# Patient Record
Sex: Female | Born: 1970 | Race: Black or African American | Hispanic: No | Marital: Married | Smoking: Former smoker
Health system: Southern US, Community
[De-identification: ages and names within clinical notes are randomized; demographics above are authoritative.]

## PROBLEM LIST (undated history)

## (undated) DIAGNOSIS — C7931 Secondary malignant neoplasm of brain: Secondary | ICD-10-CM

## (undated) DIAGNOSIS — I10 Essential (primary) hypertension: Secondary | ICD-10-CM

## (undated) DIAGNOSIS — I471 Supraventricular tachycardia, unspecified: Secondary | ICD-10-CM

## (undated) DIAGNOSIS — F329 Major depressive disorder, single episode, unspecified: Secondary | ICD-10-CM

## (undated) DIAGNOSIS — T1491XA Suicide attempt, initial encounter: Secondary | ICD-10-CM

## (undated) DIAGNOSIS — F32A Depression, unspecified: Secondary | ICD-10-CM

## (undated) DIAGNOSIS — E041 Nontoxic single thyroid nodule: Secondary | ICD-10-CM

## (undated) DIAGNOSIS — C3490 Malignant neoplasm of unspecified part of unspecified bronchus or lung: Secondary | ICD-10-CM

## (undated) HISTORY — PX: THYROID SURGERY: SHX805

---

## 1997-08-31 ENCOUNTER — Other Ambulatory Visit: Admission: RE | Admit: 1997-08-31 | Discharge: 1997-08-31 | Payer: Self-pay | Admitting: Family Medicine

## 1998-01-19 ENCOUNTER — Emergency Department (HOSPITAL_COMMUNITY): Admission: EM | Admit: 1998-01-19 | Discharge: 1998-01-19 | Payer: Self-pay | Admitting: Emergency Medicine

## 1998-02-18 ENCOUNTER — Encounter: Payer: Self-pay | Admitting: Emergency Medicine

## 1998-02-18 ENCOUNTER — Emergency Department (HOSPITAL_COMMUNITY): Admission: EM | Admit: 1998-02-18 | Discharge: 1998-02-18 | Payer: Self-pay | Admitting: Emergency Medicine

## 1998-03-25 ENCOUNTER — Emergency Department (HOSPITAL_COMMUNITY): Admission: EM | Admit: 1998-03-25 | Discharge: 1998-03-25 | Payer: Self-pay | Admitting: Emergency Medicine

## 1998-07-26 ENCOUNTER — Inpatient Hospital Stay (HOSPITAL_COMMUNITY): Admission: EM | Admit: 1998-07-26 | Discharge: 1998-07-27 | Payer: Self-pay | Admitting: Emergency Medicine

## 1999-01-12 ENCOUNTER — Emergency Department (HOSPITAL_COMMUNITY): Admission: EM | Admit: 1999-01-12 | Discharge: 1999-01-12 | Payer: Self-pay

## 2000-02-17 ENCOUNTER — Ambulatory Visit (HOSPITAL_COMMUNITY): Admission: RE | Admit: 2000-02-17 | Discharge: 2000-02-17 | Payer: Self-pay | Admitting: Family Medicine

## 2000-02-17 ENCOUNTER — Encounter: Payer: Self-pay | Admitting: Family Medicine

## 2000-11-03 ENCOUNTER — Emergency Department (HOSPITAL_COMMUNITY): Admission: EM | Admit: 2000-11-03 | Discharge: 2000-11-03 | Payer: Self-pay

## 2000-11-14 ENCOUNTER — Emergency Department (HOSPITAL_COMMUNITY): Admission: EM | Admit: 2000-11-14 | Discharge: 2000-11-14 | Payer: Self-pay | Admitting: Emergency Medicine

## 2000-11-14 ENCOUNTER — Encounter: Payer: Self-pay | Admitting: Emergency Medicine

## 2003-07-10 ENCOUNTER — Emergency Department (HOSPITAL_COMMUNITY): Admission: AC | Admit: 2003-07-10 | Discharge: 2003-07-10 | Payer: Self-pay

## 2004-07-22 ENCOUNTER — Emergency Department (HOSPITAL_COMMUNITY): Admission: EM | Admit: 2004-07-22 | Discharge: 2004-07-22 | Payer: Self-pay | Admitting: Emergency Medicine

## 2005-07-31 ENCOUNTER — Emergency Department (HOSPITAL_COMMUNITY): Admission: EM | Admit: 2005-07-31 | Discharge: 2005-07-31 | Payer: Self-pay | Admitting: Emergency Medicine

## 2005-10-16 ENCOUNTER — Emergency Department (HOSPITAL_COMMUNITY): Admission: EM | Admit: 2005-10-16 | Discharge: 2005-10-17 | Payer: Self-pay | Admitting: Emergency Medicine

## 2007-06-18 ENCOUNTER — Emergency Department (HOSPITAL_COMMUNITY): Admission: EM | Admit: 2007-06-18 | Discharge: 2007-06-18 | Payer: Self-pay | Admitting: Emergency Medicine

## 2007-08-12 ENCOUNTER — Emergency Department (HOSPITAL_COMMUNITY): Admission: EM | Admit: 2007-08-12 | Discharge: 2007-08-12 | Payer: Self-pay | Admitting: Emergency Medicine

## 2008-07-21 ENCOUNTER — Emergency Department (HOSPITAL_COMMUNITY): Admission: EM | Admit: 2008-07-21 | Discharge: 2008-07-21 | Payer: Self-pay | Admitting: Emergency Medicine

## 2009-04-12 ENCOUNTER — Emergency Department (HOSPITAL_COMMUNITY): Admission: EM | Admit: 2009-04-12 | Discharge: 2009-04-12 | Payer: Self-pay | Admitting: Emergency Medicine

## 2009-05-23 ENCOUNTER — Emergency Department (HOSPITAL_COMMUNITY): Admission: EM | Admit: 2009-05-23 | Discharge: 2009-05-23 | Payer: Self-pay | Admitting: Emergency Medicine

## 2009-06-05 ENCOUNTER — Emergency Department (HOSPITAL_COMMUNITY): Admission: EM | Admit: 2009-06-05 | Discharge: 2009-06-05 | Payer: Self-pay | Admitting: Family Medicine

## 2009-07-21 ENCOUNTER — Emergency Department (HOSPITAL_COMMUNITY): Admission: EM | Admit: 2009-07-21 | Discharge: 2009-07-21 | Payer: Self-pay | Admitting: Emergency Medicine

## 2009-08-22 ENCOUNTER — Encounter (HOSPITAL_COMMUNITY): Admission: RE | Admit: 2009-08-22 | Discharge: 2009-11-20 | Payer: Self-pay | Admitting: Endocrinology

## 2010-03-31 ENCOUNTER — Encounter: Payer: Self-pay | Admitting: Endocrinology

## 2010-05-28 LAB — STREP A DNA PROBE

## 2010-05-29 LAB — DIFFERENTIAL
Basophils Absolute: 0 10*3/uL (ref 0.0–0.1)
Monocytes Absolute: 1 10*3/uL (ref 0.1–1.0)
Monocytes Relative: 9 % (ref 3–12)
Neutro Abs: 8.2 10*3/uL — ABNORMAL HIGH (ref 1.7–7.7)
Neutrophils Relative %: 72 % (ref 43–77)

## 2010-05-29 LAB — URINALYSIS, ROUTINE W REFLEX MICROSCOPIC
Glucose, UA: NEGATIVE mg/dL
Hgb urine dipstick: NEGATIVE
Protein, ur: NEGATIVE mg/dL
pH: 5.5 (ref 5.0–8.0)

## 2010-05-29 LAB — CBC
HCT: 38.8 % (ref 36.0–46.0)
Hemoglobin: 13.1 g/dL (ref 12.0–15.0)
MCHC: 33.8 g/dL (ref 30.0–36.0)
MCV: 93.8 fL (ref 78.0–100.0)
WBC: 11.3 10*3/uL — ABNORMAL HIGH (ref 4.0–10.5)

## 2010-05-29 LAB — BASIC METABOLIC PANEL
CO2: 23 mEq/L (ref 19–32)
GFR calc Af Amer: >60 mL/min (ref >60)
GFR calc non Af Amer: >60 mL/min (ref >60)
Glucose, Bld: 87 mg/dL (ref 70–99)
Potassium: 4.1 mEq/L (ref 3.5–5.1)
Sodium: 138 mEq/L (ref 135–145)

## 2010-05-29 LAB — POCT CARDIAC MARKERS
CKMB, poc: <1 ng/mL — ABNORMAL LOW (ref 1.0–8.0)
Myoglobin, poc: 33.3 ng/mL (ref 12–200)

## 2010-06-18 LAB — URINALYSIS, ROUTINE W REFLEX MICROSCOPIC
Bilirubin Urine: NEGATIVE
Ketones, ur: NEGATIVE mg/dL
Nitrite: NEGATIVE
Urobilinogen, UA: 0.2 mg/dL (ref 0.0–1.0)

## 2010-06-18 LAB — WET PREP, GENITAL
Clue Cells Wet Prep HPF POC: NONE SEEN
Trich, Wet Prep: NONE SEEN

## 2010-12-03 LAB — DIFFERENTIAL
Basophils Absolute: 0
Basophils Relative: 0
Eosinophils Absolute: 0.1
Lymphocytes Relative: 21
Monocytes Absolute: 0.7
Neutro Abs: 6.6

## 2010-12-03 LAB — COMPREHENSIVE METABOLIC PANEL
ALT: 24
AST: 30
Albumin: 4
Calcium: 9.3
Chloride: 104
Total Bilirubin: 0.6
Total Protein: 6.9

## 2010-12-03 LAB — CBC
HCT: 36.7
Hemoglobin: 12.7
MCHC: 34.6
MCV: 90.4
RDW: 14.6
WBC: 9.4

## 2012-07-05 ENCOUNTER — Emergency Department (HOSPITAL_COMMUNITY): Payer: Self-pay

## 2012-07-05 ENCOUNTER — Encounter (HOSPITAL_COMMUNITY): Payer: Self-pay | Admitting: *Deleted

## 2012-07-05 ENCOUNTER — Emergency Department (HOSPITAL_COMMUNITY)
Admission: EM | Admit: 2012-07-05 | Discharge: 2012-07-05 | Disposition: A | Discharge status: Home / Self Care | Payer: Self-pay | Attending: Emergency Medicine | Admitting: Emergency Medicine

## 2012-07-05 DIAGNOSIS — R51 Headache: Secondary | ICD-10-CM | POA: Insufficient documentation

## 2012-07-05 DIAGNOSIS — Z79899 Other long term (current) drug therapy: Secondary | ICD-10-CM | POA: Insufficient documentation

## 2012-07-05 DIAGNOSIS — F17200 Nicotine dependence, unspecified, uncomplicated: Secondary | ICD-10-CM | POA: Insufficient documentation

## 2012-07-05 DIAGNOSIS — H53149 Visual discomfort, unspecified: Secondary | ICD-10-CM | POA: Insufficient documentation

## 2012-07-05 DIAGNOSIS — Z3202 Encounter for pregnancy test, result negative: Secondary | ICD-10-CM | POA: Insufficient documentation

## 2012-07-05 DIAGNOSIS — R110 Nausea: Secondary | ICD-10-CM | POA: Insufficient documentation

## 2012-07-05 LAB — POCT I-STAT, CHEM 8
Calcium, Ion: 1.2 mmol/L (ref 1.12–1.23)
Chloride: 104 mEq/L (ref 96–112)
Creatinine, Ser: 0.5 mg/dL (ref 0.50–1.10)
Glucose, Bld: 97 mg/dL (ref 70–99)
HCT: 42 % (ref 36.0–46.0)
Potassium: 3.6 mEq/L (ref 3.5–5.1)

## 2012-07-05 LAB — URINALYSIS, ROUTINE W REFLEX MICROSCOPIC
Bilirubin Urine: NEGATIVE
Hgb urine dipstick: NEGATIVE
Ketones, ur: NEGATIVE mg/dL
Nitrite: NEGATIVE
Specific Gravity, Urine: 1.028 (ref 1.005–1.030)
pH: 6 (ref 5.0–8.0)

## 2012-07-05 LAB — URINE MICROSCOPIC-ADD ON

## 2012-07-05 LAB — PREGNANCY, URINE: Preg Test, Ur: NEGATIVE

## 2012-07-05 MED ORDER — PROCHLORPERAZINE EDISYLATE 5 MG/ML IJ SOLN
10.0000 mg | Freq: Once | INTRAMUSCULAR | Status: AC
  Administered 2012-07-05: 10 mg via INTRAVENOUS
  Filled 2012-07-05: qty 2

## 2012-07-05 MED ORDER — SODIUM CHLORIDE 0.9 % IV BOLUS (SEPSIS)
500.0000 mL | Freq: Once | INTRAVENOUS | Status: AC
  Administered 2012-07-05: 500 mL via INTRAVENOUS

## 2012-07-05 NOTE — ED Provider Notes (Signed)
History     CSN: [id number]  Arrival date & time 07/05/12  1500   First MD Initiated Contact with Patient 07/05/12 2154      Chief Complaint  Patient presents with  . Headache    (Consider location/radiation/quality/duration/timing/severity/associated sxs/prior treatment) Patient is a 42 y.o. female presenting with headaches. The history is provided by the patient.  Headache Associated symptoms: nausea and photophobia   Associated symptoms: no abdominal pain, no back pain, no diarrhea, no pain, no neck stiffness, no numbness and no vomiting   patient has had a headache over the last 5 days.is dull and somewhat in the front of her head. She's had nausea without vomiting. No fevers. She states that light bothers her a little bit. She states about a week ago she saw something is moving around in front of her eyes. No numbness weakness. No confusion. No fevers. No trauma. No abdominal pain. No sick contacts.patient states she has some glasses that she is supposed to wear but she does not wear them.   History reviewed. No pertinent past medical history.  Past Surgical History  Procedure Laterality Date  . Thyroid nodule removal      No family history on file.  History  Substance Use Topics  . Smoking status: Current Every Day Smoker  . Smokeless tobacco: Not on file  . Alcohol Use: Yes     Comment: occ    OB History   Grav Para Term Preterm Abortions TAB SAB Ect Mult Living                  Review of Systems  Constitutional: Negative for activity change and appetite change.  HENT: Negative for neck stiffness.   Eyes: Positive for photophobia. Negative for pain.  Respiratory: Negative for chest tightness and shortness of breath.   Cardiovascular: Negative for chest pain and leg swelling.  Gastrointestinal: Positive for nausea. Negative for vomiting, abdominal pain and diarrhea.  Genitourinary: Negative for flank pain.  Musculoskeletal: Negative for back pain.  Skin:  Negative for rash.  Neurological: Positive for headaches. Negative for weakness and numbness.  Psychiatric/Behavioral: Negative for behavioral problems.    Allergies  Review of patient's allergies indicates no known allergies.  Home Medications   Current Outpatient Rx  Name  Route  Sig  Dispense  Refill  . ibuprofen (ADVIL,MOTRIN) 200 MG tablet   Oral   Take 200 mg by mouth every 6 (six) hours as needed for pain.         . naproxen sodium (ANAPROX) 220 MG tablet   Oral   Take 220 mg by mouth 2 (two) times daily with a meal.           BP 135/78  Pulse 89  Temp(Src) 98.1 F (36.7 C) (Oral)  Resp 18  SpO2 100%  LMP 06/22/2012  Physical Exam  Nursing note and vitals reviewed. Constitutional: She is oriented to person, place, and time. She appears well-developed and well-nourished.  Patient appears mildly uncomfortable  HENT:  Head: Normocephalic and atraumatic.  Eyes: EOM are normal. Pupils are equal, round, and reactive to light.  Neck: Normal range of motion. Neck supple.  Cardiovascular: Normal rate, regular rhythm and normal heart sounds.   No murmur heard. Pulmonary/Chest: Effort normal and breath sounds normal. No respiratory distress. She has no wheezes. She has no rales.  Abdominal: Soft. Bowel sounds are normal. She exhibits no distension. There is no tenderness. There is no rebound and no guarding.  Musculoskeletal:  Normal range of motion.  Neurological: She is alert and oriented to person, place, and time. No cranial nerve deficit.  Skin: Skin is warm and dry.  Psychiatric: She has a normal mood and affect. Her speech is normal.    ED Course  Procedures (including critical care time)  Labs Reviewed  URINALYSIS, ROUTINE W REFLEX MICROSCOPIC - Abnormal; Notable for the following:    APPearance TURBID (*)    All other components within normal limits  URINE MICROSCOPIC-ADD ON - Abnormal; Notable for the following:    Bacteria, UA MANY (*)    All other  components within normal limits  PREGNANCY, URINE  POCT I-STAT, CHEM 8   No results found.   1. Headache       MDM  Patient with headache. Nonfocal exam. No fevers. Patient received Compazine for treatment and later wanted to leave. Patient was not willing to stay. She was discharged home. CT scan was not done before the discharge she was told of the risks        Juliet Rude. Rubin Payor, MD 07/06/12 Jac[facility] Shoe

## 2012-07-05 NOTE — ED Notes (Signed)
Pt is here with headache for the last 5 days and it is in both eyes and reports nausea.  Pt reports hurts to just touch head

## 2012-07-05 NOTE — ED Notes (Signed)
Pt refused CT head; explained to pt. why procedure was ordered/needed; pt continued to refused and stated she was just ready to go.  Physician advised.

## 2013-11-03 ENCOUNTER — Emergency Department (HOSPITAL_COMMUNITY)
Admission: EM | Admit: 2013-11-03 | Discharge: 2013-11-03 | Disposition: A | Discharge status: Home / Self Care | Payer: Medicaid Other | Attending: Emergency Medicine | Admitting: Emergency Medicine

## 2013-11-03 ENCOUNTER — Emergency Department (HOSPITAL_COMMUNITY): Payer: Medicaid Other

## 2013-11-03 ENCOUNTER — Encounter (HOSPITAL_COMMUNITY): Payer: Self-pay | Admitting: Emergency Medicine

## 2013-11-03 DIAGNOSIS — R000 Tachycardia, unspecified: Secondary | ICD-10-CM | POA: Insufficient documentation

## 2013-11-03 DIAGNOSIS — Z79899 Other long term (current) drug therapy: Secondary | ICD-10-CM | POA: Diagnosis not present

## 2013-11-03 DIAGNOSIS — I498 Other specified cardiac arrhythmias: Secondary | ICD-10-CM | POA: Diagnosis not present

## 2013-11-03 DIAGNOSIS — Z8639 Personal history of other endocrine, nutritional and metabolic disease: Secondary | ICD-10-CM | POA: Insufficient documentation

## 2013-11-03 DIAGNOSIS — Z862 Personal history of diseases of the blood and blood-forming organs and certain disorders involving the immune mechanism: Secondary | ICD-10-CM | POA: Diagnosis not present

## 2013-11-03 DIAGNOSIS — I471 Supraventricular tachycardia: Secondary | ICD-10-CM

## 2013-11-03 DIAGNOSIS — F17200 Nicotine dependence, unspecified, uncomplicated: Secondary | ICD-10-CM | POA: Insufficient documentation

## 2013-11-03 HISTORY — DX: Nontoxic single thyroid nodule: E04.1

## 2013-11-03 LAB — CBC
HCT: 38.1 % (ref 36.0–46.0)
Hemoglobin: 13.4 g/dL (ref 12.0–15.0)
MCH: 30.7 pg (ref 26.0–34.0)
MCHC: 35.2 g/dL (ref 30.0–36.0)
MCV: 87.4 fL (ref 78.0–100.0)
PLATELETS: 205 10*3/uL (ref 150–400)
RBC: 4.36 MIL/uL (ref 3.87–5.11)
RDW: 14.2 % (ref 11.5–15.5)
WBC: 14.5 10*3/uL — ABNORMAL HIGH (ref 4.0–10.5)

## 2013-11-03 LAB — BASIC METABOLIC PANEL
ANION GAP: 15 (ref 5–15)
BUN: 16 mg/dL (ref 6–23)
CALCIUM: 8.6 mg/dL (ref 8.4–10.5)
CO2: 22 mEq/L (ref 19–32)
CREATININE: 0.53 mg/dL (ref 0.50–1.10)
Chloride: 104 mEq/L (ref 96–112)
GFR calc non Af Amer: >90 mL/min (ref >90)
Glucose, Bld: 120 mg/dL — ABNORMAL HIGH (ref 70–99)
Potassium: 3.4 mEq/L — ABNORMAL LOW (ref 3.7–5.3)
Sodium: 141 mEq/L (ref 137–147)

## 2013-11-03 LAB — I-STAT TROPONIN, ED: Troponin i, poc: 0.01 ng/mL (ref 0.00–0.08)

## 2013-11-03 NOTE — ED Notes (Signed)
Pt presents from home via [facility] with c/o tachycardia. Pt felt her heart start racing around 11am today and called EMS. EMS found patient to be in SVT with a rate of 168bpm. EMS gave 6mg  Adenosine PTA and patient converted to NSR with a rate of 90-100bpm.  Pt c/o 1/10 dull chest pain to the central chest, is alert and oriented and in NAD, HR 99 and NSR at this time.

## 2013-11-03 NOTE — Discharge Instructions (Signed)
Supraventricular Tachycardia °Supraventricular tachycardia (SVT) is an abnormal heart rhythm (arrhythmia) that causes the heart to beat very fast (tachycardia). This kind of fast heartbeat originates in the upper chambers of the heart (atria). SVT can cause the heart to beat greater than 100 beats per minute. SVT can have a rapid burst of heartbeats. This can start and stop suddenly without warning and is called nonsustained. SVT can also be sustained, in which the heart beats at a continuous fast rate.  °CAUSES  °There can be different causes of SVT. Some of these include: °· Heart valve problems such as mitral valve prolapse. °· An enlarged heart (hypertrophic cardiomyopathy). °· Congenital heart problems. °· Heart inflammation (pericarditis). °· Hyperthyroidism. °· Low potassium or magnesium levels. °· Caffeine. °· Drug use such as cocaine, methamphetamines, or stimulants. °· Some over-the-counter medicines such as: °¨ Decongestants. °¨ Diet medicines. °¨ Herbal medicines. °SYMPTOMS  °Symptoms of SVT can vary. Symptoms depend on whether the SVT is sustained or nonsustained. You may experience: °· No symptoms (asymptomatic). °· An awareness of your heart beating rapidly (palpitations). °· Shortness of breath. °· Chest pain or pressure. °If your blood pressure drops because of the SVT, you may experience: °· Fainting or near fainting. °· Weakness. °· Dizziness. °DIAGNOSIS  °Different tests can be performed to diagnose SVT, such as: °· An electrocardiogram (EKG). This is a painless test that records the electrical activity of your heart. °· Holter monitor. This is a 24 hour recording of your heart rhythm. You will be given a diary. Write down all symptoms that you have and what you were doing at the time you experienced symptoms. °· Arrhythmia monitor. This is a small device that your wear for several weeks. It records the heart rhythm when you have symptoms. °· Echocardiogram. This is an imaging test to help detect  abnormal heart structure such as congenital abnormalities, heart valve problems, or heart enlargement. °· Stress test. This test can help determine if the SVT is related to exercise. °· Electrophysiology study (EPS). This is a procedure that evaluates your heart's electrical system and can help your caregiver find the cause of your SVT. °TREATMENT  °Treatment of SVT depends on the symptoms, how often it recurs, and whether there are any underlying heart problems.  °· If symptoms are rare and no other cardiac disease is present, no treatment may be needed. °· Blood work may be done to check potassium, magnesium, and thyroid hormone levels to see if they are abnormal. If these levels are abnormal, treatment to correct the problems will occur. °Medicines °Your caregiver may use oral medicines to treat SVT. These medicines are given for long-term control of SVT. Medicines may be used alone or in combination with other treatments. These medicines work to slow nerve impulses in the heart muscle. These medicines can also be used to treat high blood pressure. Some of these medicines may include: °· Calcium channel blockers. °· Beta blockers. °· Digoxin. °Nonsurgical procedures °Nonsurgical techniques may be used if oral medicines do not work. Some examples include: °· Cardioversion. This technique uses either drugs or an electrical shock to restore a normal heart rhythm. °¨ Cardioversion drugs may be given through an intravenous (IV) line to help "reset" the heart rhythm. °¨ In electrical cardioversion, the caregiver shocks your heart to stop its beat for a split second. This helps to reset the heart to a normal rhythm. °· Ablation. This procedure is done under mild sedation. High frequency radio wave energy is used to   destroy the area of heart tissue responsible for the SVT. °HOME CARE INSTRUCTIONS  °· Do not smoke. °· Only take medicines prescribed by your caregiver. Check with your caregiver before using over-the-counter  medicines. °· Check with your caregiver about how much alcohol and caffeine (coffee, tea, colas, or chocolate) you may have. °· It is very important to keep all follow-up referrals and appointments in order to properly manage this problem. °SEEK IMMEDIATE MEDICAL CARE IF: °· You have dizziness. °· You faint or nearly faint. °· You have shortness of breath. °· You have chest pain or pressure. °· You have sudden nausea or vomiting. °· You have profuse sweating. °· You are concerned about how long your symptoms last. °· You are concerned about the frequency of your SVT episodes. °If you have the above symptoms, call your local emergency services (911 in [place] immediately. Do not drive yourself to the hospital. °MAKE SURE YOU:  °· Understand these instructions. °· Will watch your condition. °· Will get help right away if you are not doing well or get worse. °Document Released: 02/24/2005 Document Revised: 05/19/2011 Document Reviewed: 06/08/2008 °[employer]® Patient Information ©2015 [employer]. This information is not intended to replace advice given to you by your health care provider. Make sure you discuss any questions you have with your health care provider. ° °

## 2013-11-03 NOTE — ED Notes (Signed)
Pt comfortable with discharge and follow up instructions. No prescriptions., Pt declines wheelchair, escorted to waiting area.

## 2013-11-03 NOTE — ED Provider Notes (Signed)
CSN: [id number]     Arrival date & time 11/03/13  1152 History   First MD Initiated Contact with Patient 11/03/13 1153     Chief Complaint  Patient presents with  . Tachycardia     (Consider location/radiation/quality/duration/timing/severity/associated sxs/prior Treatment) HPI Comments: Patient is a 43 year old female with a past medical history of a thyroid nodule who presents to the emergency department via EMS from home with tachycardia. Patient reports she felt her heart racing beginning around 11:00 AM today after just walking around her house, her heart racing lasted about 10 minutes until she decided to call EMS. On EMS arrival, patient was noted to be in SVT with a heart rate of 168, she was given 6 mg adenosine with successful conversion to normal sinus rhythm with a rate between 90-100. States after the episode of tachycardia, she started to experience a very dull left-sided chest pain, nonradiating rated 1/10. No aggravating or alleviating factors. States she felt nauseated and "spit up" once prior to the onset of tachycardia. Denies fever, chills, shortness of breath. States she had a similar episode 3-4 years ago, however has never been evaluated by a cardiologist and does not have a primary care physician at this time.  The history is provided by the patient and the EMS personnel.    Past Medical History  Diagnosis Date  . Thyroid nodule    Past Surgical History  Procedure Laterality Date  . Thyroid nodule removal    . Thyroid surgery      Removed thyroid nodule, states still has thyroid   No family history on file. History  Substance Use Topics  . Smoking status: Current Every Day Smoker  . Smokeless tobacco: Not on file  . Alcohol Use: Yes     Comment: occ   OB History   Grav Para Term Preterm Abortions TAB SAB Ect Mult Living                 Review of Systems  Cardiovascular: Positive for chest pain and palpitations.  Gastrointestinal: Positive for nausea  (subsided).  All other systems reviewed and are negative.     Allergies  Review of patient's allergies indicates no known allergies.  Home Medications   Prior to Admission medications   Medication Sig Start Date End Date Taking? Authorizing Provider  Multiple Vitamins-Calcium (ONE-A-DAY WOMENS PO) Take 1 tablet by mouth daily. 11/01/13  Yes Historical Provider, MD   BP 135/70  Pulse 98  Temp(Src) 98.7 F (37.1 C) (Oral)  Resp 16  SpO2 98%  LMP 10/03/2013 Physical Exam  Nursing note and vitals reviewed. Constitutional: She is oriented to person, place, and time. She appears well-developed and well-nourished. No distress.  HENT:  Head: Normocephalic and atraumatic.  Mouth/Throat: Oropharynx is clear and moist.  Eyes: Conjunctivae and EOM are normal. Pupils are equal, round, and reactive to light.  Neck: Normal range of motion. Neck supple. No JVD present.  Cardiovascular: Regular rhythm, normal heart sounds and intact distal pulses.   Tachy ~100 No extremity edema.  Pulmonary/Chest: Effort normal and breath sounds normal. No respiratory distress.  Abdominal: Soft. Bowel sounds are normal. There is no tenderness.  Musculoskeletal: Normal range of motion. She exhibits no edema.  Neurological: She is alert and oriented to person, place, and time. She has normal strength. No sensory deficit.  Speech fluent, goal oriented. Moves limbs without ataxia. Equal grip strength bilateral.  Skin: Skin is warm and dry. She is not diaphoretic.  Psychiatric: She has  a normal mood and affect. Her behavior is normal.    ED Course  Procedures (including critical care time) Labs Review Labs Reviewed  CBC - Abnormal; Notable for the following:    WBC 14.5 (*)    All other components within normal limits  BASIC METABOLIC PANEL - Abnormal; Notable for the following:    Potassium 3.4 (*)    Glucose, Bld 120 (*)    All other components within normal limits  Randolm Idol, ED     Imaging Review Dg Chest 2 View  11/03/2013   CLINICAL DATA:  Tachycardia.  EXAM: CHEST  2 VIEW  COMPARISON:  05/23/2009, 07/10/2003.  FINDINGS: Mediastinum and hilar structures normal. Lungs are clear of acute infiltrates. No pleural effusion or pneumothorax. Heart size stable. No acute bony abnormality.  IMPRESSION: No acute cardiopulmonary disease.   Electronically Signed   By: Marcello Moores  Register   On: 11/03/2013 13:10     EKG Interpretation None      MDM   Final diagnoses:  SVT (supraventricular tachycardia)   Patient presenting after an episode of SVT converted with adenosine prior to arrival. She is nontoxic appearing and in no apparent distress. Afebrile, vital signs stable. Heart rate between 90-100 in the emergency department. She has a very dull, 1/10 chest pain at this time. Plan to obtain labs and consults cardiology as this is patient's second episode of SVT and she has not yet been evaluated by cardiology. 1:21 PM Pt's HR remaining stable between 80s-90s. I spoke with Trish with cardiology who will set patient up with an outpatient appointment since pt has remained in NSR. Stable for d/c. Return precautions given. Patient states understanding of treatment care plan and is agreeable.  Case discussed with attending Dr. Tawnya Crook who agrees with plan of care.   Illene Labrador, PA-C 11/03/13 1322

## 2013-11-03 NOTE — ED Provider Notes (Signed)
Medical screening examination/treatment/procedure(s) were performed by non-physician practitioner and as supervising physician I was immediately available for consultation/collaboration.   EKG Interpretation None        Ernestina Patches, MD 11/03/13 1710

## 2013-11-03 NOTE — ED Notes (Signed)
ED PA at bedside

## 2013-12-08 ENCOUNTER — Emergency Department (HOSPITAL_COMMUNITY)
Admission: EM | Admit: 2013-12-08 | Discharge: 2013-12-08 | Disposition: A | Discharge status: Home / Self Care | Payer: Medicaid Other | Attending: Emergency Medicine | Admitting: Emergency Medicine

## 2013-12-08 ENCOUNTER — Emergency Department (HOSPITAL_COMMUNITY): Payer: Medicaid Other

## 2013-12-08 ENCOUNTER — Encounter: Payer: Medicaid Other | Admitting: Interventional Cardiology

## 2013-12-08 ENCOUNTER — Encounter (HOSPITAL_COMMUNITY): Payer: Self-pay | Admitting: Emergency Medicine

## 2013-12-08 DIAGNOSIS — I499 Cardiac arrhythmia, unspecified: Secondary | ICD-10-CM | POA: Diagnosis present

## 2013-12-08 DIAGNOSIS — Z8639 Personal history of other endocrine, nutritional and metabolic disease: Secondary | ICD-10-CM | POA: Insufficient documentation

## 2013-12-08 DIAGNOSIS — M545 Low back pain: Secondary | ICD-10-CM | POA: Insufficient documentation

## 2013-12-08 DIAGNOSIS — Z8659 Personal history of other mental and behavioral disorders: Secondary | ICD-10-CM | POA: Diagnosis not present

## 2013-12-08 DIAGNOSIS — Z79899 Other long term (current) drug therapy: Secondary | ICD-10-CM | POA: Diagnosis not present

## 2013-12-08 DIAGNOSIS — Z720 Tobacco use: Secondary | ICD-10-CM | POA: Diagnosis not present

## 2013-12-08 DIAGNOSIS — I471 Supraventricular tachycardia: Secondary | ICD-10-CM

## 2013-12-08 HISTORY — DX: Major depressive disorder, single episode, unspecified: F32.9

## 2013-12-08 HISTORY — DX: Depression, unspecified: F32.A

## 2013-12-08 HISTORY — DX: Suicide attempt, initial encounter: T14.91XA

## 2013-12-08 LAB — BASIC METABOLIC PANEL
ANION GAP: 16 — AB (ref 5–15)
BUN: 7 mg/dL (ref 6–23)
CO2: 21 mEq/L (ref 19–32)
CREATININE: 0.63 mg/dL (ref 0.50–1.10)
Calcium: 9 mg/dL (ref 8.4–10.5)
Chloride: 102 mEq/L (ref 96–112)
GFR calc non Af Amer: >90 mL/min (ref >90)
GLUCOSE: 78 mg/dL (ref 70–99)
Potassium: 4 mEq/L (ref 3.7–5.3)
SODIUM: 139 meq/L (ref 137–147)

## 2013-12-08 LAB — CBC WITH DIFFERENTIAL/PLATELET
Basophils Absolute: 0 10*3/uL (ref 0.0–0.1)
Basophils Relative: 0 % (ref 0–1)
EOS ABS: 0.1 10*3/uL (ref 0.0–0.7)
EOS PCT: 1 % (ref 0–5)
HCT: 37.9 % (ref 36.0–46.0)
Hemoglobin: 13.3 g/dL (ref 12.0–15.0)
LYMPHS ABS: 2.1 10*3/uL (ref 0.7–4.0)
Lymphocytes Relative: 20 % (ref 12–46)
MCH: 30.7 pg (ref 26.0–34.0)
MCHC: 35.1 g/dL (ref 30.0–36.0)
MCV: 87.5 fL (ref 78.0–100.0)
MONO ABS: 0.8 10*3/uL (ref 0.1–1.0)
Monocytes Relative: 8 % (ref 3–12)
Neutro Abs: 7.5 10*3/uL (ref 1.7–7.7)
Neutrophils Relative %: 71 % (ref 43–77)
PLATELETS: 241 10*3/uL (ref 150–400)
RBC: 4.33 MIL/uL (ref 3.87–5.11)
RDW: 14 % (ref 11.5–15.5)
WBC: 10.5 10*3/uL (ref 4.0–10.5)

## 2013-12-08 LAB — MAGNESIUM: Magnesium: 1.8 mg/dL (ref 1.5–2.5)

## 2013-12-08 LAB — TSH: TSH: 1.22 u[IU]/mL (ref 0.350–4.500)

## 2013-12-08 MED ORDER — METOPROLOL SUCCINATE ER 25 MG PO TB24: 25.0000 mg | ORAL_TABLET | Freq: Every day | ORAL | Status: DC

## 2013-12-08 MED ORDER — SODIUM CHLORIDE 0.9 % IV BOLUS (SEPSIS)
1000.0000 mL | Freq: Once | INTRAVENOUS | Status: AC
  Administered 2013-12-08: 1000 mL via INTRAVENOUS

## 2013-12-08 NOTE — Discharge Instructions (Signed)
Return to the emergency room with worsening of symptoms, new symptoms or with symptoms that are concerning, especially prolonged palpitations, chest pain, shortness of breath, lightheadedness, for feel faint. Start taking metoprolol daily.  Follow up with cardiologist as soon as possible. Call above number for an appointment.

## 2013-12-08 NOTE — ED Provider Notes (Signed)
CSN: [id number]     Arrival date & time 12/08/13  0940 History   First MD Initiated Contact with Patient 12/08/13 [phone]     Chief Complaint  Patient presents with  . Irregular Heart Beat    svt     (Consider location/radiation/quality/duration/timing/severity/associated sxs/prior Treatment) HPI Destiny [name] is a 43 y.o. female with PMH of SVT presenting with palpitations, dull chest pain, midline not radiating and SOB since last night. Symptoms persisted and patient called EMS today, EKG with SVT and gave 6mg  of Adenosine. Patient converted to NSR with HR from 190 to mid 90s. Patient with dull chest pain 4/10 in ED, worse with deep breathing. No alleviating symptoms. No nausea, vomiting, abdominal pain or back pain. Patient with chronic dry cough and is smoker. No fevers, chills, recent illnesses. Patient with first cardiology outpatient appointment today at 1100.   Past Medical History  Diagnosis Date  . Thyroid nodule   . Depression   . Suicide attempt    Past Surgical History  Procedure Laterality Date  . Thyroid surgery      Removed thyroid nodule, states still has thyroid; 2010   Family History  Problem Relation Age of Onset  . Seizures Mother   . Colon cancer Father   . Hypertension Father    History  Substance Use Topics  . Smoking status: Current Every Day Smoker  . Smokeless tobacco: Not on file  . Alcohol Use: Yes     Comment: occ   OB History   Grav Para Term Preterm Abortions TAB SAB Ect Mult Living                 Review of Systems  Constitutional: Negative for fever and chills.  HENT: Negative for congestion and rhinorrhea.   Eyes: Negative for visual disturbance.  Respiratory: Positive for cough and shortness of breath.   Cardiovascular: Positive for chest pain and palpitations.  Gastrointestinal: Negative for nausea, vomiting and diarrhea.  Genitourinary: Negative for dysuria and hematuria.  Musculoskeletal: Negative for back pain and gait  problem.  Skin: Negative for rash.  Neurological: Negative for weakness and headaches.      Allergies  Review of patient's allergies indicates no known allergies.  Home Medications   Prior to Admission medications   Medication Sig Start Date End Date Taking? Authorizing Provider  Multiple Vitamins-Calcium (ONE-A-DAY WOMENS PO) Take 1 tablet by mouth daily. 11/01/13  Yes Historical Provider, MD  metoprolol succinate (TOPROL-XL) 25 MG 24 hr tablet Take 1 tablet (25 mg total) by mouth daily. 12/08/13   Jordan Hawks L Natania Finigan, PA-C   BP 136/78  Pulse 78  Temp(Src) 96.6 F (35.9 C) (Oral)  Resp 18  SpO2 98%  LMP 12/08/2013 Physical Exam  Nursing note and vitals reviewed. Constitutional: She appears well-developed and well-nourished. No distress.  HENT:  Head: Normocephalic and atraumatic.  Eyes: Conjunctivae and EOM are normal. Right eye exhibits no discharge. Left eye exhibits no discharge.  Neck: No JVD present.  Cardiovascular: Normal rate, regular rhythm, normal heart sounds and intact distal pulses.   No murmur heard. No peripheral edema.  Pulmonary/Chest: Effort normal and breath sounds normal. No respiratory distress.  Abdominal: Soft. Bowel sounds are normal. She exhibits no distension. There is no tenderness.  Musculoskeletal:  No midline back tenderness, step off or crepitus. No Right, Left sided lower back tenderness. No CVA tenderness.  Neurological: She is alert. She exhibits normal muscle tone. Coordination normal.  Skin: Skin is warm and dry.  She is not diaphoretic.    ED Course  Procedures (including critical care time) Labs Review Labs Reviewed  BASIC METABOLIC PANEL - Abnormal; Notable for the following:    Anion gap 16 (*)    All other components within normal limits  CBC WITH DIFFERENTIAL  MAGNESIUM  TSH    Imaging Review Dg Chest 2 View  12/08/2013   CLINICAL DATA:  Chest pain, supraventricular tachycardia  EXAM: CHEST  2 VIEW  COMPARISON:  11/03/2013   FINDINGS: The heart size and mediastinal contours are within normal limits. Both lungs are clear. The visualized skeletal structures are unremarkable.  IMPRESSION: No active cardiopulmonary disease.   Electronically Signed   By: Daryll Brod M.D.   On: 12/08/2013 10:29     EKG Interpretation   Date/Time:  Thursday December 08 2013 11:35:20 EDT Ventricular Rate:  87 PR Interval:  159 QRS Duration: 90 QT Interval:  445 QTC Calculation: 535 R Axis:   23 Text Interpretation:  Sinus rhythm Prolonged QT interval prolonged QT  unchanged from earlier in the day  Confirmed by YAO  MD, DAVID ([id number]) on  12/08/2013 12:48:03 PM     Meds given in ED:  Medications  sodium chloride 0.9 % bolus 1,000 mL (0 mLs Intravenous Stopped 12/08/13 1223)    Discharge Medication List as of 12/08/2013  4:29 PM    START taking these medications   Details  metoprolol succinate (TOPROL-XL) 25 MG 24 hr tablet Take 1 tablet (25 mg total) by mouth daily., Starting 12/08/2013, Until Discontinued, Print          MDM   Final diagnoses:  SVT (supraventricular tachycardia)   Patient with history of SVT typically converted with one or two doses of adenosine with acute episode today. Patient with mild dull ache. No SOB in ED. VSS.  EKG in NSR with prolonged QT. No hypokalemia. Normal magnesium. Labs largely unremarkable. Normal CXR. Consult to cardiology who recommend initiation of metoprolol-XL 25mg  daily and outpatient cardiology follow up. Patient is afebrile, nontoxic, and in no acute distress. Patient is appropriate for outpatient management and is stable for discharge.  Discussed return precautions with patient. Discussed all results and patient verbalizes understanding and agrees with plan.  This is a shared patient. This patient was discussed with the physician, Dr. Darl Householder who saw and evaluated the patient and agrees with the plan.      Pura Spice, PA-C 12/08/13 1725

## 2013-12-08 NOTE — Consult Note (Signed)
ELECTROPHYSIOLOGY CONSULT NOTE    Patient ID: TISHIA MAESTRE MRN: [id number], DOB/AGE: 07-12-1970 43 y.o.  Admit date: 12/08/2013 Date of Consult: 12-08-2013  Primary Physician: needs to re-establish Primary Cardiologist: new to [facility]  Reason for Consultation: SVT  HPI:  Destiny [name] is a 43 y.o. female with very little past medical history.  She had a thyroid nodule removed about 5 years ago that was benign.  She has not had regular follow-up with primary care. She states that she has had palpitations for the last 5 years. These episodes have been increasing in frequency and duration and recently have required adenosine for termination.   She states that during tachycardia she has shortness of breath and dull chest pain.  She has not had dizziness or syncope.  She otherwise denies LE edema, nausea, vomiting, diarrhea, recent fevers or chills.  She has a history of depression for which she has been hospitalized for in the past.  She also has a history of suicide attempts. She states that she has been under increased stress lately with trying to care for her grandchildren.  She currently has no thoughts of harming herself or others.  She has a long RP tachycardia. Lab work, including TSH, is unremarkable.   She has not had an echocardiogram.   She denies recent stimulant use including caffeine, sudafed, etc.   EP has been asked to evaluate for treatment options.   Past Medical History  Diagnosis Date  . Thyroid nodule   . Depression   . Suicide attempt      Surgical History:  Past Surgical History  Procedure Laterality Date  . Thyroid surgery      Removed thyroid nodule, states still has thyroid; 2010     Allergies: No Known Allergies  History   Social History  . Marital Status: Married    Spouse Name: N/A    Number of Children: 4  . Years of Education: N/A   Occupational History  . Not on file.   Social History Main Topics  . Smoking status: Current  Every Day Smoker  . Smokeless tobacco: Not on file  . Alcohol Use: Yes     Comment: occ  . Drug Use: No  . Sexual Activity: Not on file   Other Topics Concern  . Not on file   Social History Narrative  . No narrative on file     Family History - Mom deceased - seizure - 63 Dad alive - hypertension, colon cancer   BP 141/80  Pulse 82  Temp(Src) 98.4 F (36.9 C) (Oral)  Resp 23  SpO2 98%  LMP 12/08/2013  Physical Exam: Well appearing middle aged woman, NAD HEENT: Unremarkable,[place], AT Neck:  6 JVD, no thyromegally Back:  No CVA tenderness Lungs:  Clear with no wheezes, rales, or rhonchi HEART:  Regular rate rhythm, no murmurs, no rubs, no clicks Abd:  soft, positive bowel sounds, no organomegally, no rebound, no guarding Ext:  2 plus pulses, no edema, no cyanosis, no clubbing Skin:  No rashes no nodules Neuro:  CN II through XII intact, motor grossly intact   Labs:   Lab Results  Component Value Date   WBC 10.5 12/08/2013   HGB 13.3 12/08/2013   HCT 37.9 12/08/2013   MCV 87.5 12/08/2013   PLT 241 12/08/2013     Recent Labs Lab 12/08/13 0949  NA 139  K 4.0  CL 102  CO2 21  BUN 7  CREATININE 0.63  CALCIUM 9.0  GLUCOSE 78    Radiology/Studies: Dg Chest 2 View 12/08/2013   CLINICAL DATA:  Chest pain, supraventricular tachycardia  EXAM: CHEST  2 VIEW  COMPARISON:  11/03/2013  FINDINGS: The heart size and mediastinal contours are within normal limits. Both lungs are clear. The visualized skeletal structures are unremarkable.  IMPRESSION: No active cardiopulmonary disease.   Electronically Signed   By: Daryll Brod M.D.   On: 12/08/2013 10:29    EKG:  EMS tracings demonstrate long RP tachycardia at a cycle length of 362msec EKG demonstrates SR, rate 97, prolonged QTc (522)  TELEMETRY: sinus rhythm  A/P 1. Symptomatic long RP tachycardia 2. H/o depression Rec: I have discussed the treatment options with the patient and have recommended proceeding with EP  study and catheter ablation. This will be scheduled as an outpatient. She is ok for discharge.  Mikle Bosworth.D.

## 2013-12-08 NOTE — ED Notes (Signed)
All belongings sent with patient, pt acknowledged

## 2013-12-08 NOTE — ED Notes (Signed)
Patient states recent svt diagnosis and has appt with cards today, patient states starting yesterday she began feeling rapid heartbeat and was trying to hold out for cards today but could not, EMS gave Adenosine 6mg  pta with conversion of 190's hr to mid 90's, patient a/o x 4 at time of arrival, vss, NAD

## 2013-12-09 NOTE — ED Provider Notes (Signed)
Medical screening examination/treatment/procedure(s) were conducted as a shared visit with non-physician practitioner(s) and myself.  I personally evaluated the patient during the encounter.   EKG Interpretation   Date/Time:  Thursday December 08 2013 11:35:20 EDT Ventricular Rate:  87 PR Interval:  159 QRS Duration: 90 QT Interval:  445 QTC Calculation: 535 R Axis:   23 Text Interpretation:  Sinus rhythm Prolonged QT interval prolonged QT  unchanged from earlier in the day  Confirmed by YAO  MD, DAVID ([id number]) on  12/08/2013 12:48:03 PM      Destiny [name] is a 43 y.o. female hx of SVT here with palpitations. Palpitations today, had SVT with EMS. Given adenosine and resolved. On exam, calm, vitals stable. Cardiac and lung exam unremarkable. Has prolonged QT on EKG. But K and Mg unremarkable. Consulted EP, who saw patient. Recommend metoprolol 25 mg XL and will have her f/u outpatient.    Wandra Arthurs, MD 12/09/13 [phone]

## 2013-12-13 ENCOUNTER — Telehealth: Payer: Self-pay | Admitting: *Deleted

## 2013-12-13 NOTE — Telephone Encounter (Signed)
Left message with patient's brother for patient to call to follow-up.  She was seen in the ER 10/1 for SVT and wanted to pursue ablation.  Ok with Dr Lovena Le to schedule when she calls back. She does not need CARTO or anesthesia.   Chanetta Marshall, RN, BSN 12/13/2013 4:28 PM

## 2013-12-14 ENCOUNTER — Telehealth: Payer: Self-pay | Admitting: Internal Medicine

## 2013-12-14 NOTE — Telephone Encounter (Signed)
F/u   Pt returning call from amber

## 2013-12-15 ENCOUNTER — Other Ambulatory Visit: Payer: Self-pay | Admitting: *Deleted

## 2013-12-15 ENCOUNTER — Telehealth: Payer: Self-pay | Admitting: Internal Medicine

## 2013-12-15 NOTE — Telephone Encounter (Signed)
Follow up          Pt returning [employer]

## 2013-12-15 NOTE — Telephone Encounter (Signed)
Spoke with patient and she is going to have her procedure on 01/16/14 at 7:30am  She is aware to arrive at [facility] at 5:30am  NPO after midnight and will have her labs at the hospital

## 2013-12-15 NOTE — Telephone Encounter (Signed)
Follow Up  Pt requests a call back to discuss scheduling an appt per Amber's message on 12/13/2013. Please assist

## 2013-12-15 NOTE — Telephone Encounter (Signed)
Follow up   Pt returning this office call thinking we may have more information for her.

## 2013-12-15 NOTE — Telephone Encounter (Signed)
Nothing has changed will keep on 01/16/14

## 2013-12-19 ENCOUNTER — Encounter: Payer: Self-pay | Admitting: Interventional Cardiology

## 2013-12-27 ENCOUNTER — Telehealth: Payer: Self-pay | Admitting: Interventional Cardiology

## 2013-12-27 NOTE — Telephone Encounter (Signed)
New Message  Pt called states that she was told that a black spot was on her heart. She requests a call back to discuss what this means. Please call

## 2013-12-27 NOTE — Telephone Encounter (Signed)
Her cell does not accept calls from our number. [email or web address] on home number.

## 2013-12-28 NOTE — Telephone Encounter (Signed)
Advised patient i have never heard of a 'black spot on a heart". Will forward to [place] for further assistance.

## 2013-12-28 NOTE — Telephone Encounter (Signed)
Spoke with patient and let her know I did see any mention of a black spot on her heart

## 2013-12-28 NOTE — Telephone Encounter (Signed)
Left pt a message to call back. 

## 2013-12-28 NOTE — Telephone Encounter (Signed)
Follow up     Returning a nurses call from yesterday

## 2013-12-28 NOTE — Telephone Encounter (Signed)
F/u   Pt returning call from nurse.

## 2014-01-02 ENCOUNTER — Encounter (HOSPITAL_COMMUNITY): Payer: Self-pay | Admitting: Pharmacy Technician

## 2014-01-03 ENCOUNTER — Other Ambulatory Visit: Payer: Self-pay | Admitting: Nurse Practitioner

## 2014-01-16 ENCOUNTER — Encounter (HOSPITAL_COMMUNITY)
Admission: RE | Disposition: A | Discharge status: Home / Self Care | Payer: Self-pay | Source: Ambulatory Visit | Attending: Internal Medicine

## 2014-01-16 ENCOUNTER — Encounter (HOSPITAL_COMMUNITY): Payer: Self-pay | Admitting: Nurse Practitioner

## 2014-01-16 ENCOUNTER — Ambulatory Visit (HOSPITAL_COMMUNITY)
Admission: RE | Admit: 2014-01-16 | Discharge: 2014-01-16 | Disposition: A | Discharge status: Home / Self Care | Payer: Medicaid Other | Source: Ambulatory Visit | Attending: Internal Medicine | Admitting: Internal Medicine

## 2014-01-16 DIAGNOSIS — F17210 Nicotine dependence, cigarettes, uncomplicated: Secondary | ICD-10-CM | POA: Insufficient documentation

## 2014-01-16 DIAGNOSIS — I471 Supraventricular tachycardia, unspecified: Secondary | ICD-10-CM | POA: Diagnosis present

## 2014-01-16 HISTORY — DX: Supraventricular tachycardia: I47.1

## 2014-01-16 HISTORY — DX: Supraventricular tachycardia, unspecified: I47.10

## 2014-01-16 HISTORY — PX: SUPRAVENTRICULAR TACHYCARDIA ABLATION: SHX5492

## 2014-01-16 LAB — PROTIME-INR
INR: 0.98 (ref 0.00–1.49)
Prothrombin Time: 13.1 seconds (ref 11.6–15.2)

## 2014-01-16 LAB — CBC
HEMATOCRIT: 36.8 % (ref 36.0–46.0)
Hemoglobin: 12.7 g/dL (ref 12.0–15.0)
MCH: 30.2 pg (ref 26.0–34.0)
MCHC: 34.5 g/dL (ref 30.0–36.0)
MCV: 87.4 fL (ref 78.0–100.0)
PLATELETS: 262 10*3/uL (ref 150–400)
RBC: 4.21 MIL/uL (ref 3.87–5.11)
RDW: 14.4 % (ref 11.5–15.5)
WBC: 8.9 10*3/uL (ref 4.0–10.5)

## 2014-01-16 LAB — BASIC METABOLIC PANEL
ANION GAP: 13 (ref 5–15)
BUN: 11 mg/dL (ref 6–23)
CALCIUM: 8.9 mg/dL (ref 8.4–10.5)
CO2: 21 meq/L (ref 19–32)
Chloride: 104 mEq/L (ref 96–112)
Creatinine, Ser: 0.65 mg/dL (ref 0.50–1.10)
GFR calc Af Amer: >90 mL/min (ref >90)
GFR calc non Af Amer: >90 mL/min (ref >90)
Glucose, Bld: 95 mg/dL (ref 70–99)
Potassium: 3.8 mEq/L (ref 3.7–5.3)
Sodium: 138 mEq/L (ref 137–147)

## 2014-01-16 LAB — PREGNANCY, URINE: Preg Test, Ur: NEGATIVE

## 2014-01-16 SURGERY — SUPRAVENTRICULAR TACHYCARDIA ABLATION
Anesthesia: LOCAL

## 2014-01-16 MED ORDER — FENTANYL CITRATE 0.05 MG/ML IJ SOLN
INTRAMUSCULAR | Status: AC
  Filled 2014-01-16: qty 2

## 2014-01-16 MED ORDER — SODIUM CHLORIDE 0.9 % IJ SOLN: 3.0000 mL | Freq: Two times a day (BID) | INTRAMUSCULAR | Status: DC

## 2014-01-16 MED ORDER — FENTANYL CITRATE 0.05 MG/ML IJ SOLN
25.0000 ug | Freq: Once | INTRAMUSCULAR | Status: AC
  Administered 2014-01-16: 25 ug via INTRAVENOUS

## 2014-01-16 MED ORDER — ONDANSETRON HCL 4 MG/2ML IJ SOLN: 4.0000 mg | Freq: Four times a day (QID) | INTRAMUSCULAR | Status: DC | PRN

## 2014-01-16 MED ORDER — SODIUM CHLORIDE 0.9 % IJ SOLN: 3.0000 mL | INTRAMUSCULAR | Status: DC | PRN

## 2014-01-16 MED ORDER — BUPIVACAINE HCL (PF) 0.25 % IJ SOLN
INTRAMUSCULAR | Status: AC
  Filled 2014-01-16: qty 30

## 2014-01-16 MED ORDER — ACETAMINOPHEN 325 MG PO TABS: 650.0000 mg | ORAL_TABLET | ORAL | Status: DC | PRN

## 2014-01-16 MED ORDER — SODIUM CHLORIDE 0.9 % IV SOLN: 250.0000 mL | INTRAVENOUS | Status: DC | PRN

## 2014-01-16 MED ORDER — HEPARIN (PORCINE) IN NACL 2-0.9 UNIT/ML-% IJ SOLN
INTRAMUSCULAR | Status: AC
  Filled 2014-01-16: qty 500

## 2014-01-16 MED ORDER — MIDAZOLAM HCL 5 MG/5ML IJ SOLN
INTRAMUSCULAR | Status: AC
  Filled 2014-01-16: qty 5

## 2014-01-16 MED ORDER — ASPIRIN EC 81 MG PO TBEC: 81.0000 mg | DELAYED_RELEASE_TABLET | Freq: Every day | ORAL | Status: DC

## 2014-01-16 MED ORDER — FENTANYL CITRATE 0.05 MG/ML IJ SOLN
INTRAMUSCULAR | Status: AC
  Administered 2014-01-16: 25 ug via INTRAVENOUS
  Filled 2014-01-16: qty 2

## 2014-01-16 NOTE — Discharge Instructions (Signed)

## 2014-01-16 NOTE — H&P (Signed)
HPI:  Destiny [name] is a 43 y.o. female with very little past medical history. She had a thyroid nodule removed about 5 years ago that was benign. She has not had regular follow-up with primary care. She states that she has had palpitations for the last 5 years. These episodes have been increasing in frequency and duration and recently have required adenosine for termination.   She states that during tachycardia she has shortness of breath and dull chest pain. She has not had dizziness or syncope. She otherwise denies LE edema, nausea, vomiting, diarrhea, recent fevers or chills. She has a history of depression for which she has been hospitalized for in the past. She also has a history of suicide attempts. She states that she has been under increased stress lately with trying to care for her grandchildren. She currently has no thoughts of harming herself or others.  She has a long RP tachycardia. Lab work, including TSH, is unremarkable.   She has not had an echocardiogram.   She denies recent stimulant use including caffeine, sudafed, etc.   EP has been asked to evaluate for treatment options.   Past Medical History  Diagnosis Date  . Thyroid nodule   . Depression   . Suicide attempt      Surgical History:  Past Surgical History  Procedure Laterality Date  . Thyroid surgery      Removed thyroid nodule, states still has thyroid; 2010     Allergies: No Known Allergies  History   Social History  . Marital Status: Married    Spouse Name: N/A    Number of Children: 4  . Years of Education: N/A   Occupational History  . Not on file.   Social History Main Topics  . Smoking status: Current Every Day Smoker  . Smokeless tobacco: Not on file  . Alcohol Use: Yes     Comment: occ  . Drug Use: No  . Sexual Activity: Not on file   Other Topics Concern  . Not on file   Social History  Narrative  . No narrative on file     Family History - Mom deceased - seizure - 71 Dad alive - hypertension, colon cancer   BP 141/80  Pulse 82  Temp(Src) 98.4 F (36.9 C) (Oral)  Resp 23  SpO2 98%  LMP 12/08/2013  Physical Exam: Well appearing middle aged woman, NAD HEENT: Unremarkable,[place], AT Neck: 6 JVD, no thyromegally Back: No CVA tenderness Lungs: Clear with no wheezes, rales, or rhonchi HEART: Regular rate rhythm, no murmurs, no rubs, no clicks Abd: soft, positive bowel sounds, no organomegally, no rebound, no guarding Ext: 2 plus pulses, no edema, no cyanosis, no clubbing Skin: No rashes no nodules Neuro: CN II through XII intact, motor grossly intact   Labs:  Lab Results  Component Value Date   WBC 10.5 12/08/2013   HGB 13.3 12/08/2013   HCT 37.9 12/08/2013   MCV 87.5 12/08/2013   PLT 241 12/08/2013     Last Labs      Recent Labs Lab 12/08/13 0949  NA 139  K 4.0  CL 102  CO2 21  BUN 7  CREATININE 0.63  CALCIUM 9.0  GLUCOSE 78      Radiology/Studies: Dg Chest 2 View 12/08/2013 CLINICAL DATA: Chest pain, supraventricular tachycardia EXAM: CHEST 2 VIEW COMPARISON: 11/03/2013 FINDINGS: The heart size and mediastinal contours are within normal limits. Both lungs are clear. The visualized skeletal structures are unremarkable. IMPRESSION: No active cardiopulmonary  disease. Electronically Signed By: Daryll Brod M.D. On: 12/08/2013 10:29    EKG:  EMS tracings demonstrate long RP tachycardia at a cycle length of 376msec EKG demonstrates SR, rate 97, prolonged QTc (522)  TELEMETRY: sinus rhythm  A/P 1. Symptomatic long RP tachycardia 2. H/o depression Rec: I have discussed the treatment options with the patient and have recommended proceeding with EP study and catheter ablation. This will be scheduled as an outpatient. She is ok for discharge.  Mikle Bosworth.D

## 2014-01-16 NOTE — CV Procedure (Signed)
Electrophysiology Procedure Note  Procedure: EP study and catheter ablation of unusual AVNRT and atrial tachycardia  Indication: Symptomatic SVT  Preprocedure diagnosis: Symptomatic SVT  Postprocedure diagnosis: Symptomatic AV node reentrant tachycardia, unusual, and atrial tachycardia originating from the high right atrium  Description of the procedure: After informed consent was obtained, the patient was taken to the diagnostic electrophysiology laboratory in the fasting state. After the usual preparation draping, intravenous Versed and fentanyl were used for sedation. A 6 French quadripolar catheter was inserted percutaneously into the right femoral vein and advanced the right ventricle. A 6 French quadripolar catheter was inserted percutaneously into the right femoral vein and advanced to the His bundle region. A 6 French hexapolar catheter was inserted percutaneously into the right jugular vein and advanced to the coronary sinus. With catheter insertion, the patient went into SVT. Mapping of the SVT was carried out. The cycle length was 540 ms. Mapping demonstrated a long RP tachycardia. The earliest atrial activation was in the His bundle region. PVCs placed during the time of His bundle refractoriness, did not preexcitation atrium. Ventricular pacing always terminated the tachycardia. At this point, a 7 [place] quadripolar ablation catheter was inserted percutaneously into the right femoral vein and advanced under fluoroscopic guidance into the right atrium. Three-dimensional electro anatomic mapping was carried out. During mapping, the patient'' s tachycardia changed. Instead of early atrial activation in the low right atrium, the activation changed to earliest in the high right atrium. The P wave morphology change from negative in the inferior leads to positive, correlating the finding of early activation in the intracardiac electrograms. Interestingly enough, the high atrial tachycardia could be  initiated with ventricular pacing. Attention was first directed towards the low atrial tachycardia, which was thought to be unusual AV node reentrant tachycardia. 3 radiofrequency energy applications were delivered to the region near the coronary sinus between the coronary sinus and the tricuspid valve annulus. Following radiofrequency energy application in this location, the long RP, unusual AV node reentrant tachycardia could no longer be induced. Following this ablation, there was VA dissociation at 600 ms. Rapid atrial pacing was then commenced, resulting in the initiation of the second tachycardia. It was unclear whether this represented a clinical tachycardia or not, as the patient'' s previous tachycardias had all had negative P wave morphology. The high atrial tachycardia was sustained however. Additional 3-dimensional electro anatomic mapping was carried out. The patient'' s tachycardia was originating near the region of the sinus node raising the question of sinus node reentry rather than atrial tachycardia. 5 radiofrequency energy applications were delivered. During radiofrequency energy application, the tachycardia would accelerate. During the final radiofrequency energy application, allowed steam pop was witnessed. Fortunately, the patient'' s blood pressure remained stable. At the conclusion of the procedure, a quick look 2-D echo was performed demonstrating no pericardial effusion. The catheters were removed, and the patient was returned to the recovery area for sheath removal.  Complications: There is no immediate procedure, rotation.  Results. 1. Baseline ECG. Normal sinus rhythm with normal axis and intervals. 2. Baseline intervals. The sinus node cycle length was 866 ms. The HV interval was 39 ms. The QRS duration 120 ms. The AH interval was 95 ms. 3. Rapid ventricular pacing. Rapid ventricular pacing at 600 ms resulted in initiation of SVT. Following catheter ablation, rapid ventricular  pacing at 600 ms demonstrated VA dissociation. 4. programmed ventricular stimulation. Programmed ventricular stimulation was carried out from the right ventricle at a pacing cycle length of 600  ms. The S1-S2 interval was stepwise decreased from 540 ms. SVT was reproducibly induced.Ffollowing ablation, programmed ventricular stimulation demonstrated a VA dissociation. 5. Programmed atrial stimulation. Programmed atrial stimulation was carried out from the coronary sinus at a pacing cycle length of 600 ms. S1-S2 interval was stepwise decreased from 540 ms down to 430 ms with a AV node ERP was observed. During programmed atrial stimulation, there is no inducible SVT. 6. Rapid atrial pacing. Following catheter ablation of the unusual AV node reentrant tachycardia, rapid atrial pacing resulted in reinitiation of the patient'' s high atrial tachycardia by pacing at a cycle length of 410 ms.  7. Arrhythmias observed. A. Atrial tachycardia, initiation was with rapid atrial pacing, the duration was sustained, the cycle length was 520 ms. 2. Unusual AV node reentrant tachycardia, initiation was with rapid ventricular pacing and catheter manipulation, duration was sustained, cycle length was 540 ms, and method of termination was with rapid ventricular pacing. 8. Mapping. Mapping of the patient'' s unusual AV node reentrant tachycardia demonstrated early atrial activation just anterior to the coronary sinus ostium. Mapping of the patient'' s high right atrial tachycardia demonstrated early atrial activation near the region of the sinus node, along the crista terminalis. 9. Radial frequency energy application. 3 radiofrequency energy applications were delivered to the region near the coronary sinus, between the coronary sinus and the tricuspid valve annulus. There is no additional inducible AV node reentrant tachycardia. 5 radiofrequency energy applications were delivered to the high right atrium. Prior to radiofrequency  energy application, high output pacing was carried out demonstrating no diaphragmatic stimulation. During the fifth radiofrequency energy application, there was a loud steam pop, but no evidence of perforation of the right atrium. The patient was hemodynamically stable following the last radiofrequency energy application. There was no inducible SVT.  Conclusion: Successful elective physiologic study and catheter ablation of 2 supraventricular tachycardias. The first and likely clinical tachycardia was unusual AV node reentry. That said, we could not definitively exclude the permanent form of junctional reciprocating tachycardia. The second tachycardia was a high right atrial tachycardia originating near the region of the sinus node. Three-dimensional electro anatomic mapping was used to help map tachycardias.  Destiny [name], M.D.

## 2014-01-16 NOTE — Discharge Summary (Signed)
  Discharge Summary   Patient ID: Destiny [name],  MRN: [id number], DOB/AGE: 10-27-1970 43 y.o.  Admit date: 01/16/2014 Discharge date: 01/16/2014  Primary Care Provider: No PCP Per Patient Primary Cardiologist: G. Lovena Le, MD   Discharge Diagnoses Principal Problem:   Paroxysmal supraventricular tachycardia  **S/P EP study and successful radiofrequency catheter ablation this admission.   Allergies No Known Allergies  Procedures  Electrophysiologic Study and radiofrequency catheter ablation 11.9.2015  Conclusion: Successful elective physiologic study and catheter ablation of 2 supraventricular tachycardias. The first and likely clinical tachycardia was unusual AV node reentry. That said, we could not definitively exclude the permanent form of junctional reciprocating tachycardia. The second tachycardia was a high right atrial tachycardia originating near the region of the sinus node. Three-dimensional electro anatomic mapping was used to help map tachycardias. _____________   History of Present Illness  43 y/o female with a h/o palpitations, which have been increasing in frequency and have required ER visitations and IV adenosine for termination.  She was found to have a long RP tachycardia, which was felt to be amenable to catheter ablation and arrangements were made for this to be carried out as an outpatient.  Hospital Course  Patient presented to the [facility] EP lab on 01/16/2014 and underwent EP study with identification of 2 separate SVT's.  Both were successfully treated using catheter ablation.  She has had no recurrence of SVT post-procedure and has been ambulating without difficulty. She will be discharged home this evening in good condition.  Discharge Vitals Blood pressure 123/80, pulse 71, temperature 97.7 F (36.5 C), temperature source Oral, resp. rate 26, height 5\' 7"  (1.702 m), weight 160 lb (72.576 kg), last menstrual period 01/16/2014, SpO2 99 %.  Filed Weights   01/16/14 0536  Weight: 160 lb (72.576 kg)   Labs  CBC  Recent Labs  01/16/14 0533  WBC 8.9  HGB 12.7  HCT 36.8  MCV 87.4  PLT 081   Basic Metabolic Panel  Recent Labs  01/16/14 0533  NA 138  K 3.8  CL 104  CO2 21  GLUCOSE 95  BUN 11  CREATININE 0.65  CALCIUM 8.9   Disposition  Pt is being discharged home today in good condition.  Follow-up Plans & Appointments  Follow-up Information    Follow up with Cristopher Peru, MD In 4 weeks.   Specialty:  Cardiology   Why:  we will arrange and contact you.   Contact information:   [address] [place] [address] [phone]      Discharge Medications    Medication List    STOP taking these medications        metoprolol succinate 25 MG 24 hr tablet  Commonly known as:  TOPROL-XL      TAKE these medications        aspirin EC 81 MG tablet  Take 1 tablet (81 mg total) by mouth daily.       Outstanding Labs/Studies  None  Duration of Discharge Encounter   Greater than 30 minutes including physician time.  Signed, Murray Hodgkins NP 01/16/2014, 5:49 PM   Agree with above.   Mikle Bosworth.D.

## 2014-01-16 NOTE — Care Management Note (Signed)
    Page 1 of 1   01/16/2014     6:35:24 PM CARE MANAGEMENT NOTE 01/16/2014  Patient:  Destiny [name], Destiny [name]   Account Number:  [id number]  Date Initiated:  01/16/2014  Documentation initiated by:  Mariann Laster  Subjective/Objective Assessment:   SVT     Action/Plan:   CM to follow for disposition needs   Anticipated DC Date:  01/17/2014   Anticipated DC Plan:  HOME/SELF CARE         Choice offered to / List presented to:             Status of service:  Completed, signed off Medicare Important Message given?   (If response is "NO", the following Medicare IM given date fields will be blank) Date Medicare IM given:   Medicare IM given by:   Date Additional Medicare IM given:   Additional Medicare IM given by:    Discharge Disposition:  HOME/SELF CARE  Per UR Regulation:    If discussed at Long Length of Stay Meetings, dates discussed:    Comments:  Maie Kesinger RN, BSN, MSHL, CCM  Nurse - Case Manager,  (Unit [phone]  01/16/2014 Specialty Med Review:  None identified Dispo Plan:  Home / Self care CM will continue to monitor for dispostiion needs

## 2014-01-16 NOTE — Progress Notes (Signed)
7 Fr sheath removed from right IJ. 6 Fr x 2 and 8 Fr sheaths removed from the right femoral vein. Manual pressure applied at each site for 10 min. Hemostasis achieved at both sites. Vital signs remained stable for duration of sheath pulls. Post instructions given. Patient states some pain (4-5 on scale) in left mid-back; Dr. Lovena Le was notified and 25 mcg of Fentanyl was ordered and given.

## 2014-01-17 ENCOUNTER — Encounter: Payer: Self-pay | Admitting: Internal Medicine

## 2014-01-17 ENCOUNTER — Telehealth: Payer: Self-pay | Admitting: Internal Medicine

## 2014-01-17 NOTE — Telephone Encounter (Signed)
New message     Returning a nurses call.  Pt had a procedure yesterday.

## 2014-01-20 ENCOUNTER — Encounter: Payer: Self-pay | Admitting: Internal Medicine

## 2014-02-16 ENCOUNTER — Encounter (HOSPITAL_COMMUNITY): Payer: Self-pay | Admitting: Internal Medicine

## 2014-02-23 ENCOUNTER — Telehealth: Payer: Self-pay | Admitting: Interventional Cardiology

## 2014-02-23 NOTE — Telephone Encounter (Signed)
I attempted to call the patient. She had a SVT ablation on 01/16/14 with Dr. Lovena Le. According to the discharge summary, so should have stopped metoprolol at that time. I spoke with her family and they state she is not home currently. I have them to please give her the message that our office tried to reach her and ask that she call back in the morning. Will forward to triage.

## 2014-02-23 NOTE — Telephone Encounter (Signed)
New Msg     Pt calling, wants to know if she should continue taking Toprol?  Pt only has 2 pills remaining. Please contact [phone].

## 2014-02-24 NOTE — Telephone Encounter (Signed)
Lm on VM to call back

## 2014-02-27 NOTE — Telephone Encounter (Signed)
Follow up     Patient returning call back to [facility]

## 2014-02-27 NOTE — Telephone Encounter (Signed)
Left message on machine for pt to contact the office.   

## 2014-02-27 NOTE — Telephone Encounter (Signed)
Left message for patient to call back  

## 2014-03-01 NOTE — Telephone Encounter (Signed)
Spoke with pt, aware she is not to be taking toprol anymore. She reports occ palpitations. Explained she is in the healing process and to keep a journal to bring to her appt in January with dr taylor. Patient will call back if cont to have problems.

## 2014-03-21 ENCOUNTER — Encounter: Payer: Medicaid Other | Admitting: Internal Medicine

## 2014-04-21 ENCOUNTER — Encounter: Payer: Medicaid Other | Admitting: Internal Medicine

## 2014-04-25 ENCOUNTER — Emergency Department (HOSPITAL_COMMUNITY)
Admission: EM | Admit: 2014-04-25 | Discharge: 2014-04-25 | Disposition: A | Discharge status: Home / Self Care | Payer: Medicaid Other | Attending: Emergency Medicine | Admitting: Emergency Medicine

## 2014-04-25 ENCOUNTER — Encounter (HOSPITAL_COMMUNITY): Payer: Self-pay

## 2014-04-25 DIAGNOSIS — I471 Supraventricular tachycardia: Secondary | ICD-10-CM | POA: Insufficient documentation

## 2014-04-25 DIAGNOSIS — Z7982 Long term (current) use of aspirin: Secondary | ICD-10-CM | POA: Insufficient documentation

## 2014-04-25 DIAGNOSIS — Z8659 Personal history of other mental and behavioral disorders: Secondary | ICD-10-CM | POA: Diagnosis not present

## 2014-04-25 DIAGNOSIS — Z720 Tobacco use: Secondary | ICD-10-CM | POA: Insufficient documentation

## 2014-04-25 DIAGNOSIS — Z8639 Personal history of other endocrine, nutritional and metabolic disease: Secondary | ICD-10-CM | POA: Insufficient documentation

## 2014-04-25 DIAGNOSIS — R002 Palpitations: Secondary | ICD-10-CM | POA: Diagnosis present

## 2014-04-25 LAB — CBC WITH DIFFERENTIAL/PLATELET
BASOS ABS: 0 10*3/uL (ref 0.0–0.1)
BASOS PCT: 0 % (ref 0–1)
Eosinophils Absolute: 0 10*3/uL (ref 0.0–0.7)
Eosinophils Relative: 0 % (ref 0–5)
HCT: 39.5 % (ref 36.0–46.0)
Hemoglobin: 13.9 g/dL (ref 12.0–15.0)
LYMPHS PCT: 21 % (ref 12–46)
Lymphs Abs: 2 10*3/uL (ref 0.7–4.0)
MCH: 30.4 pg (ref 26.0–34.0)
MCHC: 35.2 g/dL (ref 30.0–36.0)
MCV: 86.4 fL (ref 78.0–100.0)
Monocytes Absolute: 0.8 10*3/uL (ref 0.1–1.0)
Monocytes Relative: 8 % (ref 3–12)
NEUTROS ABS: 6.6 10*3/uL (ref 1.7–7.7)
NEUTROS PCT: 71 % (ref 43–77)
PLATELETS: 257 10*3/uL (ref 150–400)
RBC: 4.57 MIL/uL (ref 3.87–5.11)
RDW: 13.8 % (ref 11.5–15.5)
WBC: 9.4 10*3/uL (ref 4.0–10.5)

## 2014-04-25 LAB — BASIC METABOLIC PANEL
Anion gap: 7 (ref 5–15)
BUN: 10 mg/dL (ref 6–23)
CHLORIDE: 105 mmol/L (ref 96–112)
CO2: 24 mmol/L (ref 19–32)
CREATININE: 0.71 mg/dL (ref 0.50–1.10)
Calcium: 8.7 mg/dL (ref 8.4–10.5)
GFR calc Af Amer: >90 mL/min (ref >90)
GFR calc non Af Amer: >90 mL/min (ref >90)
GLUCOSE: 90 mg/dL (ref 70–99)
POTASSIUM: 3.9 mmol/L (ref 3.5–5.1)
Sodium: 136 mmol/L (ref 135–145)

## 2014-04-25 NOTE — ED Provider Notes (Signed)
CSN: [id number]     Arrival date & time 04/25/14  1744 History   First MD Initiated Contact with Patient 04/25/14 1746     Chief Complaint  Patient presents with  . Abnormal ECG     (Consider location/radiation/quality/duration/timing/severity/associated sxs/prior Treatment) HPI  44 year old female presents with SVT. Around 5 PM she noticed she had palpitations and felt fatigued and weak. She has had SVT in the past and had an ablation in November 2015. No symptoms since that procedure. The patient did not have any chest pain or shortness of breath but was feeling very tired. Called the ambulance and her SVT resolved after vagal maneuvers. Converted to sinus tachycardia. Asian feels tired still but otherwise has no more palpitations and continues to not have chest pain. No inciting factors known and patient is been well prior to this starting.  Past Medical History  Diagnosis Date  . Thyroid nodule   . Depression   . Suicide attempt   . SVT (supraventricular tachycardia)     a. Long RP tachycardia;  b. 01/2014 s/p RFCA.   Past Surgical History  Procedure Laterality Date  . Thyroid surgery      Removed thyroid nodule, states still has thyroid; 2010  . Supraventricular tachycardia ablation N/A 01/16/2014    Procedure: SUPRAVENTRICULAR TACHYCARDIA ABLATION;  Surgeon: Evans Lance, MD;  Location: [facility] CATH LAB;  Service: Cardiovascular;  Laterality: N/A;   Family History  Problem Relation Age of Onset  . Seizures Mother   . Colon cancer Father   . Hypertension Father    History  Substance Use Topics  . Smoking status: Current Every Day Smoker -- 1.00 packs/day  . Smokeless tobacco: Not on file  . Alcohol Use: Yes     Comment: occ   OB History    No data available     Review of Systems  Constitutional: Negative for fever.  Respiratory: Negative for shortness of breath.   Cardiovascular: Positive for palpitations. Negative for chest pain.  Gastrointestinal: Negative for  vomiting, abdominal pain and diarrhea.  All other systems reviewed and are negative.     Allergies  Review of patient's allergies indicates no known allergies.  Home Medications   Prior to Admission medications   Medication Sig Start Date End Date Taking? Authorizing Provider  aspirin EC 81 MG tablet Take 1 tablet (81 mg total) by mouth daily. 01/16/14  Yes Rogelia Mire, NP   BP 136/81 mmHg  Pulse 98  Temp(Src) 98.1 F (36.7 C) (Oral)  Resp 22  SpO2 99%  LMP 04/16/2014 Physical Exam  Constitutional: She is oriented to person, place, and time. She appears well-developed and well-nourished.  HENT:  Head: Normocephalic and atraumatic.  Right Ear: External ear normal.  Left Ear: External ear normal.  Nose: Nose normal.  Eyes: Right eye exhibits no discharge. Left eye exhibits no discharge.  Cardiovascular: Normal rate, regular rhythm and normal heart sounds.   No murmur heard. Pulmonary/Chest: Effort normal and breath sounds normal.  Abdominal: Soft. There is no tenderness.  Neurological: She is alert and oriented to person, place, and time.  Skin: Skin is warm and dry.  Nursing note and vitals reviewed.   ED Course  Procedures (including critical care time) Labs Review Labs Reviewed  BASIC METABOLIC PANEL  CBC WITH DIFFERENTIAL/PLATELET    Imaging Review No results found.   EKG Interpretation   Date/Time:  Tuesday April 25 2014 17:51:20 EST Ventricular Rate:  98 PR Interval:  151 QRS  Duration: 89 QT Interval:  366 QTC Calculation: 467 R Axis:   5 Text Interpretation:  Normal sinus rhythm Anteroseptal infarct, age  indeterminate no significant change since Oct 2015 Confirmed by Regenia Skeeter   MD, Sima Lindenberger [phone]) on 04/25/2014 6:08:58 PM      MDM   Final diagnoses:  SVT (supraventricular tachycardia)    Patient feels well at this time. EKG from EMS reviewed, has a regular tachycardia of 186 consistent with SVT. Monitored in the ER without any  recurrence of symptoms. Feels well after hydration. Lab work shows no obvious etiology why she would've these symptoms recurring. Patient states she will call cardiology next available time and schedule close follow-up. Discussed strict return precautions. No anginal symptoms during the SVT.    Ephraim Hamburger, MD 04/25/14 [phone]

## 2014-04-25 NOTE — ED Notes (Signed)
Per [facility]: pt. Is from home. Started feeling bad around 1700. Hx SVT, ablation in Nov. 2015. Initially 170-180 SVT on EMS arrival. Pt. Vagal for 10 sec. With conversion to ST 110. Denies SOB/CP. Denies N/V.

## 2014-06-09 DIAGNOSIS — C3490 Malignant neoplasm of unspecified part of unspecified bronchus or lung: Secondary | ICD-10-CM

## 2014-06-09 HISTORY — DX: Malignant neoplasm of unspecified part of unspecified bronchus or lung: C34.90

## 2014-06-10 ENCOUNTER — Encounter (HOSPITAL_COMMUNITY): Payer: Self-pay | Admitting: Nurse Practitioner

## 2014-06-10 ENCOUNTER — Emergency Department (HOSPITAL_COMMUNITY): Payer: Medicaid Other

## 2014-06-10 ENCOUNTER — Inpatient Hospital Stay (HOSPITAL_COMMUNITY)
Admission: EM | Admit: 2014-06-10 | Discharge: 2014-06-15 | DRG: 981 | Disposition: A | Discharge status: Home / Self Care | Payer: Medicaid Other | Attending: Internal Medicine | Admitting: Internal Medicine

## 2014-06-10 DIAGNOSIS — R739 Hyperglycemia, unspecified: Secondary | ICD-10-CM | POA: Diagnosis present

## 2014-06-10 DIAGNOSIS — B9689 Other specified bacterial agents as the cause of diseases classified elsewhere: Secondary | ICD-10-CM | POA: Diagnosis not present

## 2014-06-10 DIAGNOSIS — C7931 Secondary malignant neoplasm of brain: Principal | ICD-10-CM | POA: Diagnosis present

## 2014-06-10 DIAGNOSIS — G060 Intracranial abscess and granuloma: Secondary | ICD-10-CM | POA: Diagnosis present

## 2014-06-10 DIAGNOSIS — C3411 Malignant neoplasm of upper lobe, right bronchus or lung: Secondary | ICD-10-CM | POA: Diagnosis present

## 2014-06-10 DIAGNOSIS — I10 Essential (primary) hypertension: Secondary | ICD-10-CM | POA: Diagnosis present

## 2014-06-10 DIAGNOSIS — K219 Gastro-esophageal reflux disease without esophagitis: Secondary | ICD-10-CM | POA: Diagnosis present

## 2014-06-10 DIAGNOSIS — C3490 Malignant neoplasm of unspecified part of unspecified bronchus or lung: Secondary | ICD-10-CM

## 2014-06-10 DIAGNOSIS — R51 Headache: Secondary | ICD-10-CM

## 2014-06-10 DIAGNOSIS — I471 Supraventricular tachycardia: Secondary | ICD-10-CM | POA: Diagnosis present

## 2014-06-10 DIAGNOSIS — G936 Cerebral edema: Secondary | ICD-10-CM | POA: Diagnosis present

## 2014-06-10 DIAGNOSIS — C801 Malignant (primary) neoplasm, unspecified: Secondary | ICD-10-CM | POA: Diagnosis not present

## 2014-06-10 DIAGNOSIS — J95811 Postprocedural pneumothorax: Secondary | ICD-10-CM | POA: Diagnosis not present

## 2014-06-10 DIAGNOSIS — C342 Malignant neoplasm of middle lobe, bronchus or lung: Secondary | ICD-10-CM | POA: Diagnosis not present

## 2014-06-10 DIAGNOSIS — R519 Headache, unspecified: Secondary | ICD-10-CM

## 2014-06-10 DIAGNOSIS — F329 Major depressive disorder, single episode, unspecified: Secondary | ICD-10-CM | POA: Diagnosis present

## 2014-06-10 DIAGNOSIS — K089 Disorder of teeth and supporting structures, unspecified: Secondary | ICD-10-CM | POA: Insufficient documentation

## 2014-06-10 DIAGNOSIS — I38 Endocarditis, valve unspecified: Secondary | ICD-10-CM | POA: Diagnosis not present

## 2014-06-10 DIAGNOSIS — Z720 Tobacco use: Secondary | ICD-10-CM | POA: Diagnosis present

## 2014-06-10 DIAGNOSIS — R918 Other nonspecific abnormal finding of lung field: Secondary | ICD-10-CM | POA: Insufficient documentation

## 2014-06-10 DIAGNOSIS — F17210 Nicotine dependence, cigarettes, uncomplicated: Secondary | ICD-10-CM | POA: Diagnosis present

## 2014-06-10 DIAGNOSIS — F32A Depression, unspecified: Secondary | ICD-10-CM | POA: Diagnosis present

## 2014-06-10 LAB — BASIC METABOLIC PANEL
Anion gap: 7 (ref 5–15)
BUN: 6 mg/dL (ref 6–23)
CO2: 24 mmol/L (ref 19–32)
Calcium: 8.6 mg/dL (ref 8.4–10.5)
Chloride: 106 mmol/L (ref 96–112)
Creatinine, Ser: 0.55 mg/dL (ref 0.50–1.10)
GFR calc Af Amer: >90 mL/min (ref >90)
Glucose, Bld: 106 mg/dL — ABNORMAL HIGH (ref 70–99)
Potassium: 3.9 mmol/L (ref 3.5–5.1)
Sodium: 137 mmol/L (ref 135–145)

## 2014-06-10 LAB — CBC WITH DIFFERENTIAL/PLATELET
BASOS ABS: 0 10*3/uL (ref 0.0–0.1)
Basophils Relative: 0 % (ref 0–1)
EOS PCT: 0 % (ref 0–5)
Eosinophils Absolute: 0 10*3/uL (ref 0.0–0.7)
HCT: 38.5 % (ref 36.0–46.0)
HEMOGLOBIN: 12.9 g/dL (ref 12.0–15.0)
Lymphocytes Relative: 9 % — ABNORMAL LOW (ref 12–46)
Lymphs Abs: 0.8 10*3/uL (ref 0.7–4.0)
MCH: 30.1 pg (ref 26.0–34.0)
MCHC: 33.5 g/dL (ref 30.0–36.0)
MCV: 90 fL (ref 78.0–100.0)
Monocytes Absolute: 0.1 10*3/uL (ref 0.1–1.0)
Monocytes Relative: 1 % — ABNORMAL LOW (ref 3–12)
Neutro Abs: 7.3 10*3/uL (ref 1.7–7.7)
Neutrophils Relative %: 90 % — ABNORMAL HIGH (ref 43–77)
Platelets: 257 10*3/uL (ref 150–400)
RBC: 4.28 MIL/uL (ref 3.87–5.11)
RDW: 14.2 % (ref 11.5–15.5)
WBC: 8.3 10*3/uL (ref 4.0–10.5)

## 2014-06-10 MED ORDER — METRONIDAZOLE IN NACL 5-0.79 MG/ML-% IV SOLN
500.0000 mg | Freq: Three times a day (TID) | INTRAVENOUS | Status: DC
  Administered 2014-06-11 – 2014-06-13 (×6): 500 mg via INTRAVENOUS
  Filled 2014-06-10 (×6): qty 100

## 2014-06-10 MED ORDER — GADOBENATE DIMEGLUMINE 529 MG/ML IV SOLN
15.0000 mL | Freq: Once | INTRAVENOUS | Status: AC | PRN
  Administered 2014-06-10: 15 mL via INTRAVENOUS

## 2014-06-10 MED ORDER — SODIUM CHLORIDE 0.9 % IV BOLUS (SEPSIS)
1000.0000 mL | Freq: Once | INTRAVENOUS | Status: AC
  Administered 2014-06-10: 1000 mL via INTRAVENOUS

## 2014-06-10 MED ORDER — VANCOMYCIN HCL 10 G IV SOLR
1250.0000 mg | Freq: Once | INTRAVENOUS | Status: AC
  Administered 2014-06-11: 1250 mg via INTRAVENOUS
  Filled 2014-06-10: qty 1250

## 2014-06-10 MED ORDER — SODIUM CHLORIDE 0.9 % IV BOLUS (SEPSIS): 1000.0000 mL | Freq: Once | INTRAVENOUS | Status: DC

## 2014-06-10 MED ORDER — DIPHENHYDRAMINE HCL 50 MG/ML IJ SOLN
25.0000 mg | Freq: Once | INTRAMUSCULAR | Status: AC
  Administered 2014-06-10: 25 mg via INTRAVENOUS
  Filled 2014-06-10: qty 1

## 2014-06-10 MED ORDER — ONDANSETRON HCL 4 MG/2ML IJ SOLN
4.0000 mg | Freq: Four times a day (QID) | INTRAMUSCULAR | Status: DC | PRN
  Administered 2014-06-14: 4 mg via INTRAVENOUS
  Filled 2014-06-10: qty 2

## 2014-06-10 MED ORDER — PANTOPRAZOLE SODIUM 40 MG PO TBEC
40.0000 mg | DELAYED_RELEASE_TABLET | Freq: Every day | ORAL | Status: DC
  Administered 2014-06-11 – 2014-06-15 (×4): 40 mg via ORAL
  Filled 2014-06-10 (×5): qty 1

## 2014-06-10 MED ORDER — DEXTROSE 5 % IV SOLN
2.0000 g | Freq: Once | INTRAVENOUS | Status: AC
  Administered 2014-06-10: 2 g via INTRAVENOUS
  Filled 2014-06-10: qty 2

## 2014-06-10 MED ORDER — METOCLOPRAMIDE HCL 5 MG/ML IJ SOLN
10.0000 mg | Freq: Once | INTRAMUSCULAR | Status: AC
  Administered 2014-06-10: 10 mg via INTRAVENOUS
  Filled 2014-06-10: qty 2

## 2014-06-10 MED ORDER — KETOROLAC TROMETHAMINE 15 MG/ML IJ SOLN
15.0000 mg | Freq: Once | INTRAMUSCULAR | Status: AC
  Administered 2014-06-10: 15 mg via INTRAVENOUS
  Filled 2014-06-10: qty 1

## 2014-06-10 MED ORDER — ACETAMINOPHEN 325 MG PO TABS
650.0000 mg | ORAL_TABLET | Freq: Four times a day (QID) | ORAL | Status: DC | PRN
  Administered 2014-06-12 – 2014-06-13 (×4): 650 mg via ORAL
  Filled 2014-06-10 (×5): qty 2

## 2014-06-10 MED ORDER — SODIUM CHLORIDE 0.9 % IV SOLN
INTRAVENOUS | Status: DC
  Administered 2014-06-14 – 2014-06-15 (×2): via INTRAVENOUS

## 2014-06-10 MED ORDER — VANCOMYCIN HCL IN DEXTROSE 750-5 MG/150ML-% IV SOLN
750.0000 mg | Freq: Three times a day (TID) | INTRAVENOUS | Status: DC
  Administered 2014-06-11 – 2014-06-13 (×7): 750 mg via INTRAVENOUS
  Filled 2014-06-10 (×8): qty 150

## 2014-06-10 MED ORDER — DEXTROSE 5 % IV SOLN
2.0000 g | Freq: Two times a day (BID) | INTRAVENOUS | Status: DC
  Administered 2014-06-11 – 2014-06-12 (×4): 2 g via INTRAVENOUS
  Filled 2014-06-10 (×6): qty 2

## 2014-06-10 MED ORDER — METRONIDAZOLE IVPB CUSTOM
1.0000 g | Freq: Once | INTRAVENOUS | Status: AC
  Administered 2014-06-10: 1 g via INTRAVENOUS
  Filled 2014-06-10 (×2): qty 200

## 2014-06-10 MED ORDER — DEXAMETHASONE SODIUM PHOSPHATE 10 MG/ML IJ SOLN
10.0000 mg | Freq: Once | INTRAMUSCULAR | Status: AC
  Administered 2014-06-10: 10 mg via INTRAVENOUS
  Filled 2014-06-10: qty 1

## 2014-06-10 MED ORDER — HALOPERIDOL LACTATE 5 MG/ML IJ SOLN
5.0000 mg | Freq: Once | INTRAMUSCULAR | Status: AC
  Administered 2014-06-10: 5 mg via INTRAVENOUS
  Filled 2014-06-10: qty 1

## 2014-06-10 MED ORDER — SODIUM CHLORIDE 0.9 % IV SOLN
Freq: Once | INTRAVENOUS | Status: AC
  Administered 2014-06-10: 19:00:00 via INTRAVENOUS

## 2014-06-10 MED ORDER — ACETAMINOPHEN 650 MG RE SUPP: 650.0000 mg | Freq: Four times a day (QID) | RECTAL | Status: DC | PRN

## 2014-06-10 MED ORDER — SODIUM CHLORIDE 0.9 % IJ SOLN
3.0000 mL | Freq: Two times a day (BID) | INTRAMUSCULAR | Status: DC
  Administered 2014-06-11 – 2014-06-14 (×4): 3 mL via INTRAVENOUS

## 2014-06-10 MED ORDER — ONDANSETRON HCL 4 MG PO TABS: 4.0000 mg | ORAL_TABLET | Freq: Four times a day (QID) | ORAL | Status: DC | PRN

## 2014-06-10 NOTE — ED Notes (Signed)
Patient transported to MRI 

## 2014-06-10 NOTE — H&P (Signed)
[facility] History and Physical  Patient: Destiny [name]  MRN: [id number]  DOB: December 25, 1970  DOS: the patient was seen and examined on 06/10/2014 PCP: Barrie Lyme, FNP  Chief Complaint: Headache  HPI: MARTINA BRODBECK is a 44 y.o. female with Past medical history of thyroid nodule status post removal of the nodule only, depression, SVT, hypertension. The patient is presenting with complaints of headache. She mentions she has been having this headache for last 3-4 days with runny nose and congestion. She denies any sick contact denies any fever or chills denies any dizziness or lightheadedness denies any chest pain denies any abdominal pain no complaints of cough no complaints of diarrhea no complaints of burning urination no complaints of rash. She also denies any focal deficit or any tremor neck episodes or any seizure-like episodes. There is no complaint of any staring spells by husband. She mentions she gets regular periods and her last period completed yesterday. She denies any ear infection he denies any throat infection. Denies any recent surgery  The patient is coming from home. And at her baseline independent for most of her ADL.  Review of Systems: as mentioned in the history of present illness.  A Comprehensive review of the other systems is negative.  Past Medical History  Diagnosis Date  . Thyroid nodule   . Depression   . Suicide attempt   . SVT (supraventricular tachycardia)     a. Long RP tachycardia;  b. 01/2014 s/p RFCA.   Past Surgical History  Procedure Laterality Date  . Thyroid surgery      Removed thyroid nodule, states still has thyroid; 2010  . Supraventricular tachycardia ablation N/A 01/16/2014    Procedure: SUPRAVENTRICULAR TACHYCARDIA ABLATION;  Surgeon: Evans Lance, MD;  Location: [facility] CATH LAB;  Service: Cardiovascular;  Laterality: N/A;   Social History:  reports that she has been smoking.  She does not have any smokeless tobacco history  on file. She reports that she drinks alcohol. She reports that she does not use illicit drugs.  No Known Allergies  Family History  Problem Relation Age of Onset  . Seizures Mother   . Colon cancer Father   . Hypertension Father     Prior to Admission medications   Medication Sig Start Date End Date Taking? Authorizing Provider  cycloSPORINE (RESTASIS) 0.05 % ophthalmic emulsion Place 1 drop into both eyes 2 (two) times daily.   Yes Historical Provider, MD  Olopatadine HCl 0.7 % SOLN Apply 1 drop to eye daily.   Yes Historical Provider, MD  omeprazole (PRILOSEC) 20 MG capsule Take 20 mg by mouth daily as needed (for heartburn).   Yes Historical Provider, MD  amoxicillin (AMOXIL) 500 MG capsule Take 500 mg by mouth 3 (three) times daily.    Historical Provider, MD  aspirin EC 81 MG tablet Take 1 tablet (81 mg total) by mouth daily. 01/16/14   Rogelia Mire, NP  lisinopril-hydrochlorothiazide (PRINZIDE,ZESTORETIC) 10-12.5 MG per tablet Take 1 tablet by mouth daily.    Historical Provider, MD    Physical Exam: Filed Vitals:   06/10/14 2145 06/10/14 2200 06/10/14 2300 06/10/14 2315  BP: 148/82 108/79 137/79 145/77  Pulse: 105 81 76 81  Temp:      TempSrc:      Resp:      Height:      Weight:      SpO2: 100% 98% 98% 99%    General: Alert, Awake and Oriented to Time, Place and  Person. Appear in mild distress Eyes: PERRL ENT: Oral Mucosa clear moist. Neck: no JVD Cardiovascular: S1 and S2 Present, np Murmur, Peripheral Pulses Present Respiratory: Bilateral Air entry equal and Decreased, Clear to Auscultation, noCrackles, no wheezes Abdomen: Bowel Sound present, Soft and non tender Skin: no Rash Extremities: no Pedal edema, no calf tenderness Neurologic: Grossly no focal neuro deficit.  Labs on Admission:  CBC:  Recent Labs Lab 06/10/14 1858  WBC 8.3  NEUTROABS 7.3  HGB 12.9  HCT 38.5  MCV 90.0  PLT 257    CMP     Component Value Date/Time   NA 137  06/10/2014 1858   K 3.9 06/10/2014 1858   CL 106 06/10/2014 1858   CO2 24 06/10/2014 1858   GLUCOSE 106* 06/10/2014 1858   BUN 6 06/10/2014 1858   CREATININE 0.55 06/10/2014 1858   CALCIUM 8.6 06/10/2014 1858   PROT 6.9 06/18/2007 1417   ALBUMIN 4.0 06/18/2007 1417   AST 30 06/18/2007 1417   ALT 24 06/18/2007 1417   ALKPHOS 72 06/18/2007 1417   BILITOT 0.6 06/18/2007 1417   GFRNONAA >90 06/10/2014 1858   GFRAA >90 06/10/2014 1858    No results for input(s): LIPASE, AMYLASE in the last 168 hours.  No results for input(s): CKTOTAL, CKMB, CKMBINDEX, TROPONINI in the last 168 hours. BNP (last 3 results) No results for input(s): BNP in the last 8760 hours.  ProBNP (last 3 results) No results for input(s): PROBNP in the last 8760 hours.   Radiological Exams on Admission: Ct Head Wo Contrast  06/10/2014   CLINICAL DATA:  Headache  EXAM: CT HEAD WITHOUT CONTRAST  TECHNIQUE: Contiguous axial images were obtained from the base of the skull through the vertex without intravenous contrast.  COMPARISON:  None.  FINDINGS: Left temporal lobe hypodensity is identified extending from the white matter to the cortical surface. No adjacent mass effect upon the ventricles is identified. No acute hemorrhage is identified. Ventricles are normal in size. No midline shift. No skull fracture. No soft tissue abnormality. Orbits and paranasal sinuses are intact.  IMPRESSION: Abnormal low-density within the left temporal lobe with a configuration that could indicate acute to subacute stroke given the provided clinical symptom, although underlying mass lesion such as glioblastoma multiforme or possibly herpes encephalopathy can appear similar. Brain MRI with contrast is recommended.  These results were called by telephone at the time of interpretation on 06/10/2014 at 6:33 pm to Dr. Reather Converse, who verbally acknowledged these results.   Electronically Signed   By: Conchita Paris M.D.   On: 06/10/2014 18:36   Mr Jeri Cos  UY Contrast  06/10/2014   CLINICAL DATA:  Headache, weakness and nausea.  EXAM: MRI HEAD WITHOUT AND WITH CONTRAST  TECHNIQUE: Multiplanar, multiecho pulse sequences of the brain and surrounding structures were obtained without and with intravenous contrast.  CONTRAST:  65mL MULTIHANCE GADOBENATE DIMEGLUMINE 529 MG/ML IV SOLN  COMPARISON:  Head CT same day  FINDINGS: There is a 14 mm in diameter ring enhancing lesion at the inferior temporal lobe on the left. Wall thickness is fairly uniform measuring about 2 mm. Internal material shows fluid intensity. Pronounced surrounding vasogenic edema. No restricted diffusion. Remainder the brain is normal. No second lesion. No ischemic infarction. No hemorrhage, hydrocephalus or extra-axial fluid collection. The adjacent temporal bone and mastoid air cells are clear. No inflammatory sinus disease. No pituitary mass. No calvarial abnormality.  IMPRESSION: 14 mm in diameter ring enhancing lesion at the inferior temporal lobe on  the left with pronounced regional vasogenic edema. The imaging findings are most consistent with brain abscess. No restricted diffusion however. The differential diagnosis is that of necrotic neoplasm, but that is felt less likely.   Electronically Signed   By: Nelson Chimes M.D.   On: 06/10/2014 21:25   Assessment/Plan Principal Problem:   Brain abscess Active Problems:   GERD (gastroesophageal reflux disease)   Hypertension   1. Brain abscess The patient is presenting with complaints of chronic headache. She has received Benadryl and Reglan and dexamethasone in the ER for headache. Despite that her headache did not improve. And therefore they opted a CT of the head which was concerning and therefore an MRI of the brain with contrast. Pain which shows ring enhancing lesion suggesting possible infectious cause with vasogenic edema. Her examination is benign. With this current etiology of this infection is unclear. Therefore she'll be  treated with vancomycin ceftriaxone and Flagyl. Follow cultures. Neuro-surgery has already been consulted who will be following up with the patient. Infectious diseases need to be consulted in the morning. Symptomatic management for headache.  2. GERD. Continue Protonix  Advance goals of care discussion: Full code   Consults: Neurosurgery  DVT Prophylaxis: meechanical compression device Nutrition. Nothing by mouth except medications midnight.  Family Communication: Family was present at bedside, opportunity was given to ask question and all questions were answered satisfactorily at the time of interview. Disposition: Admitted to observation telemetry unit.  Author: Berle Mull, MD [facility] Pager: [phone] 06/10/2014, 11:38 PM    If 7PM-7AM, please contact night-coverage [email or web address] Password [email or web address]

## 2014-06-10 NOTE — Progress Notes (Signed)
Patient ID: Destiny [name], female   DOB: 1971/02/15, 44 y.o.   MRN: [id number] Patient seen, note dictated. hospitalist to admit . Source of infection?  Recommend consult to cardiology and ID. Will follow. Patient and family member aware

## 2014-06-10 NOTE — ED Provider Notes (Signed)
CSN: [id number]     Arrival date & time 06/10/14  1440 History   First MD Initiated Contact with Patient 06/10/14 1555     Chief Complaint  Patient presents with  . Headache     (Consider location/radiation/quality/duration/timing/severity/associated sxs/prior Treatment) Patient is a 44 y.o. female presenting with headaches. The history is provided by the patient.  Headache Pain location:  Frontal Quality:  Dull Radiates to:  Does not radiate Severity currently:  5/10 Onset quality:  Gradual Duration:  2 days Timing:  Constant Progression:  Worsening Chronicity:  New Similar to prior headaches: no   Relieved by:  Nothing Worsened by:  Nothing Ineffective treatments:  Acetaminophen Associated symptoms: no blurred vision, no fever, no nausea, no numbness, no URI, no vomiting and no weakness     Past Medical History  Diagnosis Date  . Thyroid nodule   . Depression   . Suicide attempt   . SVT (supraventricular tachycardia)     a. Long RP tachycardia;  b. 01/2014 s/p RFCA.   Past Surgical History  Procedure Laterality Date  . Thyroid surgery      Removed thyroid nodule, states still has thyroid; 2010  . Supraventricular tachycardia ablation N/A 01/16/2014    Procedure: SUPRAVENTRICULAR TACHYCARDIA ABLATION;  Surgeon: Evans Lance, MD;  Location: [facility] CATH LAB;  Service: Cardiovascular;  Laterality: N/A;   Family History  Problem Relation Age of Onset  . Seizures Mother   . Colon cancer Father   . Hypertension Father    History  Substance Use Topics  . Smoking status: Current Every Day Smoker -- 1.00 packs/day  . Smokeless tobacco: Not on file  . Alcohol Use: Yes     Comment: occ   OB History    No data available     Review of Systems  Constitutional: Negative for fever.  Eyes: Negative for blurred vision.  Gastrointestinal: Negative for nausea and vomiting.  Neurological: Positive for headaches. Negative for weakness and numbness.  All other systems reviewed  and are negative.     Allergies  Review of patient's allergies indicates no known allergies.  Home Medications   Prior to Admission medications   Medication Sig Start Date End Date Taking? Authorizing Provider  cycloSPORINE (RESTASIS) 0.05 % ophthalmic emulsion Place 1 drop into both eyes 2 (two) times daily.   Yes Historical Provider, MD  Olopatadine HCl 0.7 % SOLN Apply 1 drop to eye daily.   Yes Historical Provider, MD  omeprazole (PRILOSEC) 20 MG capsule Take 20 mg by mouth daily as needed (for heartburn).   Yes Historical Provider, MD  amoxicillin (AMOXIL) 500 MG capsule Take 500 mg by mouth 3 (three) times daily.    Historical Provider, MD  aspirin EC 81 MG tablet Take 1 tablet (81 mg total) by mouth daily. 01/16/14   Rogelia Mire, NP  lisinopril-hydrochlorothiazide (PRINZIDE,ZESTORETIC) 10-12.5 MG per tablet Take 1 tablet by mouth daily.    Historical Provider, MD   BP 145/77 mmHg  Pulse 81  Temp(Src) 97.9 F (36.6 C) (Oral)  Resp 20  Ht 5\' 7"  (1.702 m)  Wt 160 lb (72.576 kg)  BMI 25.05 kg/m2  SpO2 99% Physical Exam  Constitutional: She is oriented to person, place, and time. She appears well-developed and well-nourished. No distress.  HENT:  Head: Normocephalic and atraumatic.  Mouth/Throat: Oropharynx is clear and moist.  Eyes: EOM are normal. Pupils are equal, round, and reactive to light.  Neck: Normal range of motion. Neck supple.  Cardiovascular: Normal rate, regular rhythm, normal heart sounds and intact distal pulses.  Exam reveals no gallop and no friction rub.   No murmur heard. Pulmonary/Chest: Effort normal and breath sounds normal. No respiratory distress. She has no wheezes. She has no rales.  Abdominal: Soft. She exhibits no distension. There is no tenderness.  Musculoskeletal: Normal range of motion. She exhibits no edema.  Neurological: She is alert and oriented to person, place, and time. She has normal strength. No cranial nerve deficit or  sensory deficit. Coordination and gait normal.  Normal gait and tandem gait. No dysmetria on finger to nose testing or heel to shin testing.  Skin: She is not diaphoretic.  Nursing note and vitals reviewed.   ED Course  Procedures (including critical care time) Labs Review Labs Reviewed  CBC WITH DIFFERENTIAL/PLATELET - Abnormal; Notable for the following:    Neutrophils Relative % 90 (*)    Lymphocytes Relative 9 (*)    Monocytes Relative 1 (*)    All other components within normal limits  BASIC METABOLIC PANEL - Abnormal; Notable for the following:    Glucose, Bld 106 (*)    All other components within normal limits  CULTURE, BLOOD (ROUTINE X 2)  CULTURE, BLOOD (ROUTINE X 2)  MRSA PCR SCREENING  SEDIMENTATION RATE  C-REACTIVE PROTEIN  LACTATE DEHYDROGENASE  URINALYSIS W MICROSCOPIC  STREP PNEUMONIAE URINARY ANTIGEN  LEGIONELLA ANTIGEN, URINE  HIV ANTIBODY (ROUTINE TESTING)  RHEUMATOID FACTOR  HEPATIC FUNCTION PANEL  URINE RAPID DRUG SCREEN (HOSP PERFORMED)  TSH    Imaging Review Ct Head Wo Contrast  06/10/2014   CLINICAL DATA:  Headache  EXAM: CT HEAD WITHOUT CONTRAST  TECHNIQUE: Contiguous axial images were obtained from the base of the skull through the vertex without intravenous contrast.  COMPARISON:  None.  FINDINGS: Left temporal lobe hypodensity is identified extending from the white matter to the cortical surface. No adjacent mass effect upon the ventricles is identified. No acute hemorrhage is identified. Ventricles are normal in size. No midline shift. No skull fracture. No soft tissue abnormality. Orbits and paranasal sinuses are intact.  IMPRESSION: Abnormal low-density within the left temporal lobe with a configuration that could indicate acute to subacute stroke given the provided clinical symptom, although underlying mass lesion such as glioblastoma multiforme or possibly herpes encephalopathy can appear similar. Brain MRI with contrast is recommended.  These  results were called by telephone at the time of interpretation on 06/10/2014 at 6:33 pm to Dr. Reather Converse, who verbally acknowledged these results.   Electronically Signed   By: Conchita Paris M.D.   On: 06/10/2014 18:36   Mr Jeri Cos JM Contrast  06/10/2014   CLINICAL DATA:  Headache, weakness and nausea.  EXAM: MRI HEAD WITHOUT AND WITH CONTRAST  TECHNIQUE: Multiplanar, multiecho pulse sequences of the brain and surrounding structures were obtained without and with intravenous contrast.  CONTRAST:  2mL MULTIHANCE GADOBENATE DIMEGLUMINE 529 MG/ML IV SOLN  COMPARISON:  Head CT same day  FINDINGS: There is a 14 mm in diameter ring enhancing lesion at the inferior temporal lobe on the left. Wall thickness is fairly uniform measuring about 2 mm. Internal material shows fluid intensity. Pronounced surrounding vasogenic edema. No restricted diffusion. Remainder the brain is normal. No second lesion. No ischemic infarction. No hemorrhage, hydrocephalus or extra-axial fluid collection. The adjacent temporal bone and mastoid air cells are clear. No inflammatory sinus disease. No pituitary mass. No calvarial abnormality.  IMPRESSION: 14 mm in diameter ring enhancing lesion at  the inferior temporal lobe on the left with pronounced regional vasogenic edema. The imaging findings are most consistent with brain abscess. No restricted diffusion however. The differential diagnosis is that of necrotic neoplasm, but that is felt less likely.   Electronically Signed   By: Nelson Chimes M.D.   On: 06/10/2014 21:25     EKG Interpretation None      MDM   Final diagnoses:  Brain abscess   45 year old female with paroxysmal SVT but no other history presents with worsening headache. Describes frontal headache with pressure behind her eyes bilaterally. Worsening over the last 2 days and unresponsive to IM shot at her PCP as well as Tylenol at home. No significant history of migraines. Does endorse that the headache is usually in the  mornings but has no urinary difficulty or difficulty with walking. Visual acuity intact and vital signs are stable. Nonfocal neurologic exam. No concern for emergent etiology to headache including infectious or ICH or mass. Headache appears benign. We'll treat with cocktail and reassess.  5:30 PM no improvement in headache. CT scan head ordered without contrast. Haldol for additional headache treatment.  CT showed possible mass versus abscess or infarct. MRI ordered. MRI showed likely abscess but possible necrotic mass. Discussed with Neurosurgery. Flagyl and rocephin given. Will admit to hospitalist for antiobiotics and workup.  Larence Penning, MD 06/10/14 1275  Elnora Morrison, MD 06/11/14 [phone]

## 2014-06-10 NOTE — Progress Notes (Signed)
ANTIBIOTIC CONSULT NOTE - INITIAL  Pharmacy Consult for Vancomycin/Ceftriaxone Indication: Brain Abscess  No Known Allergies  Patient Measurements: Height: 5\' 7"  (170.2 cm) Weight: 160 lb (72.576 kg) IBW/kg (Calculated) : 61.6  Vital Signs: Temp: 97.9 F (36.6 C) (04/02 1450) Temp Source: Oral (04/02 1450) BP: 141/78 mmHg (04/02 2130) Pulse Rate: 80 (04/02 2130)  Labs:  Recent Labs  06/10/14 1858  WBC 8.3  HGB 12.9  PLT 257  CREATININE 0.55   Estimated Creatinine Clearance: 86.4 mL/min (by C-G formula based on Cr of 0.55).  Medical History: Past Medical History  Diagnosis Date  . Thyroid nodule   . Depression   . Suicide attempt   . SVT (supraventricular tachycardia)     a. Long RP tachycardia;  b. 01/2014 s/p RFCA.   Assessment: 44 y/o F with HA/nausea over the past week, found to have 63mm ring enhancing lesion on MRI, WBC WNL, renal function good, other labs as above.   Goal of Therapy:  Vancomycin trough level 15-20 mcg/ml  Plan:  -Vancomycin 1250 mg IV x 1, then 750 mg IV q8h -Ceftriaxone 2g IV q12h -Flagyl per MD -Trend WBC, temp, renal function  -Drug levels as indicated   Narda Bonds 06/10/2014,10:59 PM

## 2014-06-10 NOTE — ED Notes (Signed)
shes been having headaches and nausea this week. She went to her doctor and they gave her a shot for the pain and pill for the nausea. These did not relieve the headache. She tried tylenol today but pain persists so her doctor told her to come to the ED for further evaluation

## 2014-06-10 NOTE — Consult Note (Signed)
NAME:  Destiny [name], LUEVANO NO.:  [id number]  MEDICAL RECORD NO.:  [id number]  LOCATION:  D35C                         FACILITY:  [facility]  PHYSICIAN:  Leeroy Cha, M.D.   DATE OF BIRTH:  10-16-1970  DATE OF CONSULTATION:  06/10/2014 DATE OF DISCHARGE:                                CONSULTATION   HISTORY:  [name] is a lady who was admitted today because she had some headache and some nausea.  As a part of the workup, she has a CT scan of the head, which show a lesion in the left temporal area.  She denies any weakness or any sensory changes, any evidence of seizure.  In in November of last year, she has ablation procedure by the cardiologist.  She denies any recent infection.  She denies having any ear or nose infection.  I talked to her at length.  Clinically, she is completely normal.  The cranial nerves are normal.  She had normal strength.  Normal sensation.  The CT scan showed a lesion in the left temporal area with some edema.  This lesion is rounded with the cyst and looked like an infection.  The question comes to where the infection come from.  I do not know, if we are dealing with endocarditis or source of infection postprocedure, or something else.  The mastoid is completely normal.  PLAN:  The patient is going to be admitted by the hospitalist.  She is going to be started on antibiotics and I presume that the cardiologist and Infectious Disease will get involved.  In the meantime, I will continue to follow.  Most likely, we await for the input from Infectious Disease, but the location being the left temporal area.  We will monitor with a CT scan.  If that is decreasing inside with empiric antibiotics, there is no point to any surgery.  If that is not the case, then localized needle aspiration will be done.          ______________________________ Leeroy Cha, M.D.     EB/MEDQ  D:  06/10/2014  T:  06/10/2014  Job:  [id number]

## 2014-06-11 DIAGNOSIS — F329 Major depressive disorder, single episode, unspecified: Secondary | ICD-10-CM

## 2014-06-11 DIAGNOSIS — R739 Hyperglycemia, unspecified: Secondary | ICD-10-CM | POA: Diagnosis present

## 2014-06-11 DIAGNOSIS — I471 Supraventricular tachycardia: Secondary | ICD-10-CM

## 2014-06-11 DIAGNOSIS — Z720 Tobacco use: Secondary | ICD-10-CM

## 2014-06-11 LAB — RAPID URINE DRUG SCREEN, HOSP PERFORMED
Amphetamines: NOT DETECTED
Barbiturates: NOT DETECTED
Benzodiazepines: NOT DETECTED
COCAINE: NOT DETECTED
OPIATES: NOT DETECTED
Tetrahydrocannabinol: NOT DETECTED

## 2014-06-11 LAB — COMPREHENSIVE METABOLIC PANEL
ALBUMIN: 3.3 g/dL — AB (ref 3.5–5.2)
ALT: 17 U/L (ref 0–35)
AST: 21 U/L (ref 0–37)
Alkaline Phosphatase: 60 U/L (ref 39–117)
Anion gap: 8 (ref 5–15)
BILIRUBIN TOTAL: 0.7 mg/dL (ref 0.3–1.2)
BUN: <5 mg/dL — ABNORMAL LOW (ref 6–23)
CHLORIDE: 107 mmol/L (ref 96–112)
CO2: 20 mmol/L (ref 19–32)
CREATININE: 0.57 mg/dL (ref 0.50–1.10)
Calcium: 8.8 mg/dL (ref 8.4–10.5)
GFR calc Af Amer: >90 mL/min (ref >90)
GFR calc non Af Amer: >90 mL/min (ref >90)
Glucose, Bld: 121 mg/dL — ABNORMAL HIGH (ref 70–99)
POTASSIUM: 3.7 mmol/L (ref 3.5–5.1)
SODIUM: 135 mmol/L (ref 135–145)
TOTAL PROTEIN: 6.5 g/dL (ref 6.0–8.3)

## 2014-06-11 LAB — HEPATIC FUNCTION PANEL
ALT: 19 U/L (ref 0–35)
AST: 23 U/L (ref 0–37)
Albumin: 3.4 g/dL — ABNORMAL LOW (ref 3.5–5.2)
Alkaline Phosphatase: 70 U/L (ref 39–117)
BILIRUBIN TOTAL: 0.2 mg/dL — AB (ref 0.3–1.2)
Bilirubin, Direct: <0.1 mg/dL (ref 0.0–0.5)
Total Protein: 6.6 g/dL (ref 6.0–8.3)

## 2014-06-11 LAB — URINALYSIS W MICROSCOPIC (NOT AT ARMC)
Bilirubin Urine: NEGATIVE
Glucose, UA: >1000 mg/dL — AB
HGB URINE DIPSTICK: NEGATIVE
Ketones, ur: 15 mg/dL — AB
Leukocytes, UA: NEGATIVE
NITRITE: NEGATIVE
Protein, ur: NEGATIVE mg/dL
SPECIFIC GRAVITY, URINE: 1.02 (ref 1.005–1.030)
Urobilinogen, UA: 0.2 mg/dL (ref 0.0–1.0)
pH: 5.5 (ref 5.0–8.0)

## 2014-06-11 LAB — CBC WITH DIFFERENTIAL/PLATELET
Basophils Absolute: 0 10*3/uL (ref 0.0–0.1)
Basophils Relative: 0 % (ref 0–1)
Eosinophils Absolute: 0 10*3/uL (ref 0.0–0.7)
Eosinophils Relative: 0 % (ref 0–5)
HEMATOCRIT: 36.2 % (ref 36.0–46.0)
HEMOGLOBIN: 12.3 g/dL (ref 12.0–15.0)
LYMPHS ABS: 1.6 10*3/uL (ref 0.7–4.0)
Lymphocytes Relative: 12 % (ref 12–46)
MCH: 29.9 pg (ref 26.0–34.0)
MCHC: 34 g/dL (ref 30.0–36.0)
MCV: 87.9 fL (ref 78.0–100.0)
MONOS PCT: 6 % (ref 3–12)
Monocytes Absolute: 0.7 10*3/uL (ref 0.1–1.0)
NEUTROS ABS: 11.2 10*3/uL — AB (ref 1.7–7.7)
Neutrophils Relative %: 82 % — ABNORMAL HIGH (ref 43–77)
Platelets: 273 10*3/uL (ref 150–400)
RBC: 4.12 MIL/uL (ref 3.87–5.11)
RDW: 13.8 % (ref 11.5–15.5)
WBC: 13.6 10*3/uL — AB (ref 4.0–10.5)

## 2014-06-11 LAB — SEDIMENTATION RATE: Sed Rate: 8 mm/hr (ref 0–22)

## 2014-06-11 LAB — RHEUMATOID FACTOR: Rhuematoid fact SerPl-aCnc: 10.7 IU/mL (ref 0.0–13.9)

## 2014-06-11 LAB — MRSA PCR SCREENING: MRSA by PCR: NEGATIVE

## 2014-06-11 LAB — LACTATE DEHYDROGENASE: LDH: 162 U/L (ref 94–250)

## 2014-06-11 LAB — STREP PNEUMONIAE URINARY ANTIGEN: STREP PNEUMO URINARY ANTIGEN: NEGATIVE

## 2014-06-11 LAB — C-REACTIVE PROTEIN: CRP: <0.5 mg/dL — ABNORMAL LOW (ref <0.60)

## 2014-06-11 LAB — HIV ANTIBODY (ROUTINE TESTING W REFLEX): HIV SCREEN 4TH GENERATION: NONREACTIVE

## 2014-06-11 LAB — TSH: TSH: 0.38 u[IU]/mL (ref 0.350–4.500)

## 2014-06-11 MED ORDER — NICOTINE 21 MG/24HR TD PT24
21.0000 mg | MEDICATED_PATCH | Freq: Every day | TRANSDERMAL | Status: DC
  Administered 2014-06-11 – 2014-06-15 (×5): 21 mg via TRANSDERMAL
  Filled 2014-06-11 (×5): qty 1

## 2014-06-11 MED ORDER — BUTALBITAL-APAP-CAFFEINE 50-325-40 MG PO TABS
2.0000 | ORAL_TABLET | ORAL | Status: DC | PRN
  Administered 2014-06-11 – 2014-06-15 (×12): 2 via ORAL
  Filled 2014-06-11 (×13): qty 2

## 2014-06-11 NOTE — Progress Notes (Signed)
Patient arrived to 4N08 from ED. Patient reports HA is calming down. Telemetry applied. Patient oriented to room and unit. Will continue to monitor patient closely. Burnell Blanks, RN

## 2014-06-11 NOTE — Consult Note (Signed)
[facility] for Infectious Disease    Date of Admission:  06/10/2014           Day 1 vancomycin        Day 1 ceftriaxone        Day 1 metronidazole       Reason for Consult: Possible brain abscess    Referring Physician: Dr. Verneita Griffes  Principal Problem:   Brain abscess Active Problems:   Paroxysmal supraventricular tachycardia   Depression   GERD (gastroesophageal reflux disease)   Hypertension   Hyperglycemia   Cigarette smoker   . cefTRIAXone (ROCEPHIN)  IV  2 g Intravenous Q12H  . metronidazole  500 mg Intravenous Q8H  . nicotine  21 mg Transdermal Daily  . pantoprazole  40 mg Oral Daily  . sodium chloride  3 mL Intravenous Q12H  . vancomycin  750 mg Intravenous Q8H    Recommendations: 1. Continue current antibiotics pending blood culture results and further observation 2. Agree with HIV antibody   Assessment: She has a 14 mm ring-enhancing lesion in her left temporal region. No other lesions were noted on MRI. She has been having night sweats and has lost about 8 pounds recently. Although I do not see any source for bacteremia exceeding of her brain she may very well have occult infection. Ideally it would be best to get an aspirate of the lesion to confirm a diagnosis and help guide antibiotic management but another strategy is to treat empirically and monitor symptoms and serial scans. I will continue current antibiotics for now pending further observation and blood culture results.    HPI: Destiny [name] is a 44 y.o. female who began having frontal headaches about 2 weeks ago. The headache has been persistent and progressively more severe. She's also had some nausea and vomiting. She does not believe that she has had any fever and has not had any chills. However she has been having night sweats for the past week and a half. She saw her primary care physician on 06/09/2014 and was given an injection for pain and something for nausea. An antibiotic was  prescribed for possible sinusitis but she never got it filled because she was worse and came here yesterday where she was admitted after CT and MR scans revealed a 14 mm ring-enhancing lesion in the left temporal region. No evidence of sinusitis or mastoiditis was noted. She was started on empiric antibiotic therapy for possible brain abscess and says that she feels a little bit better this morning.  She has a history of thyroid nodule resection in 2010. She also underwent radiofrequency catheter ablation for paroxysmal SVT last November. She did have one recurrent episode of SVT in February. She has not had any further evaluation for that. She states that she's been under a great deal of stress recently and has not been able to schedule a visit to see her cardiologist, Dr. Crissie Sickles.   Review of Systems: Constitutional: positive for night sweats and weight loss, negative for anorexia, chills and fevers Eyes: negative Ears, nose, mouth, throat, and face: negative Respiratory: negative Cardiovascular: as noted in history of present illness Gastrointestinal: positive for diarrhea, negative for abdominal pain, constipation, nausea and vomiting Genitourinary:negative  Past Medical History  Diagnosis Date  . Thyroid nodule   . Depression   . Suicide attempt   . SVT (supraventricular tachycardia)     a. Long RP tachycardia;  b. 01/2014 s/p RFCA.  History  Substance Use Topics  . Smoking status: Current Every Day Smoker -- 1.00 packs/day  . Smokeless tobacco: Not on file  . Alcohol Use: Yes     Comment: occ    Family History  Problem Relation Age of Onset  . Seizures Mother   . Colon cancer Father   . Hypertension Father    No Known Allergies  OBJECTIVE: Blood pressure 128/94, pulse 96, temperature 98.6 F (37 C), temperature source Oral, resp. rate 18, height 5\' 7"  (1.702 m), weight 160 lb (72.576 kg), SpO2 98 %. General: she is alert and in no distress sitting up in  bed Skin: no rash Lymph nodes: No palpable adenopathy Neck: Supple Eyes: Normal external exam Oral: No loose or missing teeth. No oropharyngeal lesions Lungs: clear Cor: regular S1 and S2 with no murmurs Abdomen: soft nontender with no palpable masses Joints and extremities: Normal  Lab Results Lab Results  Component Value Date   WBC 13.6* 06/11/2014   HGB 12.3 06/11/2014   HCT 36.2 06/11/2014   MCV 87.9 06/11/2014   PLT 273 06/11/2014    Lab Results  Component Value Date   CREATININE 0.57 06/11/2014   BUN <5* 06/11/2014   NA 135 06/11/2014   K 3.7 06/11/2014   CL 107 06/11/2014   CO2 20 06/11/2014    Lab Results  Component Value Date   ALT 17 06/11/2014   AST 21 06/11/2014   ALKPHOS 60 06/11/2014   BILITOT 0.7 06/11/2014     Microbiology: Recent Results (from the past 240 hour(s))  MRSA PCR Screening     Status: None   Collection Time: 06/11/14  2:16 AM  Result Value Ref Range Status   MRSA by PCR NEGATIVE NEGATIVE Final    Comment:        The GeneXpert MRSA Assay (FDA approved for NASAL specimens only), is one component of a comprehensive MRSA colonization surveillance program. It is not intended to diagnose MRSA infection nor to guide or monitor treatment for MRSA infections.     Michel Bickers, MD [facility] [phone] pager   [phone] cell 06/11/2014, 2:42 PM

## 2014-06-11 NOTE — Progress Notes (Signed)
Patient ID: Destiny [name], female   DOB: 02/06/1971, 44 y.o.   MRN: [id number] Doing well. No complains this am

## 2014-06-11 NOTE — Progress Notes (Signed)
Destiny [name]:[id number] DOB: 11-28-1970 DOA: 06/10/2014 PCP: Barrie Lyme, FNP  Brief narrative:  44 y/o ? PMH thyroid nodule s/p nodule removal 2010-TSH 0. 3 on admission, SVT s/p Catheter ablation for long RP tachycardia , 01/2014-recent sinus massage in the ED 04/2014 with conversion to sinus tach 110, Htn admit from [facility] 4/2 after having intractable HA and found on CT head to have a brain abcess Tells me that she has been sleeping with her grandchild for the past 2 weeks and he had a bad cough which was then treated with antibiotics and she developed headaches and upper respiratory illnesses recently  Lives with husband of 9 years and is monogamous Has not had an HIV done in the past  Past medical history-As per Problem list Chart reviewed as below-   Consultants:  Cardiology  ID  Procedures:  CT head MRI head  4/2 IMPRESSION: 14 mm in diameter ring enhancing lesion at the inferior temporal lobe on the left with pronounced regional vasogenic edema. The imaging findings are most consistent with brain abscess. No restricted diffusion however. The differential diagnosis is that of necrotic neoplasm, but that is felt less likely.IMPRESSION: 14 mm in diameter ring enhancing lesion at the inferior temporal lobe on the left with pronounced regional vasogenic edema. The imaging findings are most consistent with brain abscess. No restricted diffusion however. The differential diagnosis is that of  necrotic neoplasm, but that is felt less likely.  Antibiotics:  Flagyll 4/2  Vanc 4/2  Ceftriaxone 4/2   Subjective   Doing fair No headache right now Wishes nicotine patch Wishes to eat    Objective      Objective: Filed Vitals:   06/10/14 2315 06/11/14 0030 06/11/14 0648 06/11/14 0944  BP: 145/77 140/77 131/51 128/94  Pulse: 81 80 76 96  Temp:  98.2 F (36.8 C) 98.6 F (37 C) 98.6 F (37 C)  TempSrc:  Oral Oral Oral  Resp:   20 18  Height:        Weight:      SpO2: 99% 99% 97% 98%   No intake or output data in the 24 hours ending 06/11/14 0948  Exam:  General: Alert pleasant oriented frail Cardiovascular: S1-S2 no murmur rub or gallop sinus Respiratory: Clinically clear no added sound Abdomen: Soft nontender nondistended no rebound Skin grossly intact Neuro power 5/5 reflexes 2/3 finger-nose-finger normal, sensory intact  Data Reviewed: Basic Metabolic Panel:  Recent Labs Lab 06/10/14 1858  NA 137  K 3.9  CL 106  CO2 24  GLUCOSE 106*  BUN 6  CREATININE 0.55  CALCIUM 8.6   Liver Function Tests:  Recent Labs Lab 06/10/14 2313  AST 23  ALT 19  ALKPHOS 70  BILITOT 0.2*  PROT 6.6  ALBUMIN 3.4*   No results for input(s): LIPASE, AMYLASE in the last 168 hours. No results for input(s): AMMONIA in the last 168 hours. CBC:  Recent Labs Lab 06/10/14 1858  WBC 8.3  NEUTROABS 7.3  HGB 12.9  HCT 38.5  MCV 90.0  PLT 257   Cardiac Enzymes: No results for input(s): CKTOTAL, CKMB, CKMBINDEX, TROPONINI in the last 168 hours. BNP: Invalid input(s): POCBNP CBG: No results for input(s): GLUCAP in the last 168 hours.  Recent Results (from the past 240 hour(s))  MRSA PCR Screening     Status: None   Collection Time: 06/11/14  2:16 AM  Result Value Ref Range Status   MRSA by PCR NEGATIVE NEGATIVE Final  Comment:        The GeneXpert MRSA Assay (FDA approved for NASAL specimens only), is one component of a comprehensive MRSA colonization surveillance program. It is not intended to diagnose MRSA infection nor to guide or monitor treatment for MRSA infections.      Studies:              All Imaging reviewed and is as per above notation   Scheduled Meds: . cefTRIAXone (ROCEPHIN)  IV  2 g Intravenous Q12H  . metronidazole  500 mg Intravenous Q8H  . pantoprazole  40 mg Oral Daily  . sodium chloride  3 mL Intravenous Q12H  . vancomycin  750 mg Intravenous Q8H   Continuous Infusions: . sodium  chloride 75 mL/hr at 06/11/14 0035     Assessment/Plan: 1. Brain abscess-appreciate neurosurgery input-on empiric Flagyl, ceftriaxone, vancomycin. Usually would need tissue diagnosis?-I have consulted cardiology to rule out SBE with TEE, infectious disease to weigh in as well-greatly appreciate input. In addition I've ordered an HIV test. She may eat right now but will be nothing by mouth after midnight for potential TEE to rule out SBE.  If on TEE = no SBE, we will need to potentially speak with neurology and oncology regarding this being a primary CNS lymphoma? 2. Paroxysmal SVT with long RP phenomenon-currently in sinus. Monitor. 3. Smoker-nicotine patch given 4. Reflux-give pantoprazole daily  Code Status: Full Family Communication: Updated husband at bedside Disposition Plan: Inpatient   Verneita Griffes, MD  [facility] Pager [phone] 06/11/2014, 9:48 AM    LOS: 1 day

## 2014-06-12 ENCOUNTER — Inpatient Hospital Stay (HOSPITAL_COMMUNITY): Payer: Medicaid Other

## 2014-06-12 ENCOUNTER — Encounter (HOSPITAL_COMMUNITY): Payer: Self-pay | Admitting: *Deleted

## 2014-06-12 ENCOUNTER — Encounter (HOSPITAL_COMMUNITY)
Admission: EM | Disposition: A | Discharge status: Home / Self Care | Payer: Self-pay | Source: Home / Self Care | Attending: Family Medicine

## 2014-06-12 DIAGNOSIS — K089 Disorder of teeth and supporting structures, unspecified: Secondary | ICD-10-CM | POA: Insufficient documentation

## 2014-06-12 DIAGNOSIS — B9689 Other specified bacterial agents as the cause of diseases classified elsewhere: Secondary | ICD-10-CM

## 2014-06-12 DIAGNOSIS — I38 Endocarditis, valve unspecified: Secondary | ICD-10-CM

## 2014-06-12 HISTORY — PX: TEE WITHOUT CARDIOVERSION: SHX5443

## 2014-06-12 LAB — LEGIONELLA ANTIGEN, URINE

## 2014-06-12 LAB — HIV ANTIBODY (ROUTINE TESTING W REFLEX): HIV Screen 4th Generation wRfx: NONREACTIVE

## 2014-06-12 SURGERY — ECHOCARDIOGRAM, TRANSESOPHAGEAL
Anesthesia: Moderate Sedation

## 2014-06-12 MED ORDER — FENTANYL CITRATE 0.05 MG/ML IJ SOLN
INTRAMUSCULAR | Status: AC
  Filled 2014-06-12: qty 2

## 2014-06-12 MED ORDER — FENTANYL CITRATE 0.05 MG/ML IJ SOLN
INTRAMUSCULAR | Status: DC | PRN
Start: 2014-06-12 — End: 2014-06-12
  Administered 2014-06-12 (×2): 25 ug via INTRAVENOUS

## 2014-06-12 MED ORDER — IOHEXOL 300 MG/ML  SOLN
100.0000 mL | Freq: Once | INTRAMUSCULAR | Status: AC | PRN
  Administered 2014-06-12: 100 mL via INTRAVENOUS

## 2014-06-12 MED ORDER — BUTAMBEN-TETRACAINE-BENZOCAINE 2-2-14 % EX AERO
INHALATION_SPRAY | CUTANEOUS | Status: DC | PRN
  Administered 2014-06-12: 2 via TOPICAL

## 2014-06-12 MED ORDER — MIDAZOLAM HCL 5 MG/ML IJ SOLN
INTRAMUSCULAR | Status: AC
  Filled 2014-06-12: qty 2

## 2014-06-12 MED ORDER — MIDAZOLAM HCL 10 MG/2ML IJ SOLN
INTRAMUSCULAR | Status: DC | PRN
  Administered 2014-06-12 (×3): 2 mg via INTRAVENOUS

## 2014-06-12 MED ORDER — MORPHINE SULFATE 2 MG/ML IJ SOLN
1.0000 mg | INTRAMUSCULAR | Status: DC | PRN
  Administered 2014-06-13 – 2014-06-15 (×3): 1 mg via INTRAVENOUS
  Filled 2014-06-12 (×3): qty 1

## 2014-06-12 MED ORDER — IOHEXOL 300 MG/ML  SOLN
25.0000 mL | INTRAMUSCULAR | Status: AC
  Administered 2014-06-12 (×2): 25 mL via ORAL

## 2014-06-12 NOTE — CV Procedure (Signed)
    PROCEDURE NOTE  Procedure:  Transesophageal echocardiogram with colorflow doppler and agitated saline contrast study Operator:  Fransico Him, MD Indications: brain abcess Complications:None IV Meds:Versed 6mg , Fentanyl 6mcg IV  Results:   Normal LV size and function Normal RV size and function Normal RA Normal LA and LA appendage Normal TV with trivial TR Normal MV with trivial MV Normal PV Normal trileaflet AV with no AR Normal interatrial septum with no evidence of interatrial shunt by colorflow doppler or agitated saline contrast injection. Normal thoracic aorta  The patient tolerated the procedure well without complications and was transferred back to her room in stable condition.\  Signed: Fransico Him, MD [facility]  06/12/2014

## 2014-06-12 NOTE — Progress Notes (Signed)
  Echocardiogram Echocardiogram Transesophageal has been performed.  Destiny [name] [name] 06/12/2014, 3:25 PM

## 2014-06-12 NOTE — Progress Notes (Signed)
[facility] for Infectious Disease    Subjective: No new complaints   Antibiotics:  Anti-infectives    Start     Dose/Rate Route Frequency Ordered Stop   06/11/14 1000  cefTRIAXone (ROCEPHIN) 2 g in dextrose 5 % 50 mL IVPB     2 g 100 mL/hr over 30 Minutes Intravenous Every 12 hours 06/10/14 2307     06/11/14 0800  vancomycin (VANCOCIN) IVPB 750 mg/150 ml premix     750 mg 150 mL/hr over 60 Minutes Intravenous Every 8 hours 06/10/14 2307     06/11/14 0700  metroNIDAZOLE (FLAGYL) IVPB 500 mg     500 mg 100 mL/hr over 60 Minutes Intravenous Every 8 hours 06/10/14 2252     06/10/14 2315  vancomycin (VANCOCIN) 1,250 mg in sodium chloride 0.9 % 250 mL IVPB     1,250 mg 166.7 mL/hr over 90 Minutes Intravenous  Once 06/10/14 2307 06/11/14 0228   06/10/14 2230  metroNIDAZOLE (FLAGYL) IVPB 1 g     1 g 200 mL/hr over 60 Minutes Intravenous  Once 06/10/14 2146 06/11/14 0025   06/10/14 2200  cefTRIAXone (ROCEPHIN) 2 g in dextrose 5 % 50 mL IVPB     2 g 100 mL/hr over 30 Minutes Intravenous  Once 06/10/14 2146 06/10/14 2325      Medications: Scheduled Meds: . cefTRIAXone (ROCEPHIN)  IV  2 g Intravenous Q12H  . metronidazole  500 mg Intravenous Q8H  . nicotine  21 mg Transdermal Daily  . pantoprazole  40 mg Oral Daily  . sodium chloride  3 mL Intravenous Q12H  . vancomycin  750 mg Intravenous Q8H   Continuous Infusions: . sodium chloride 75 mL/hr at 06/11/14 0035   PRN Meds:.acetaminophen **OR** acetaminophen, butalbital-acetaminophen-caffeine, morphine injection, ondansetron **OR** ondansetron (ZOFRAN) IV    Objective: Weight change: 8 lb 4.8 oz (3.765 kg) No intake or output data in the 24 hours ending 06/12/14 1332 Blood pressure 131/75, pulse 88, temperature 98.3 F (36.8 C), temperature source Oral, resp. rate 20, height 5\' 7"  (1.702 m), weight 168 lb 4.8 oz (76.34 kg), SpO2 96 %. Temp:  [98 F (36.7 C)-98.8 F (37.1 C)] 98.3 F (36.8 C) (04/04 0958) Pulse  Rate:  [80-91] 88 (04/04 0958) Resp:  [18-20] 20 (04/04 0958) BP: (120-144)/(64-83) 131/75 mmHg (04/04 0958) SpO2:  [96 %-100 %] 96 % (04/04 0958) Weight:  [168 lb 4.8 oz (76.34 kg)] 168 lb 4.8 oz (76.34 kg) (04/04 6073)  Physical Exam: General: Alert and awake, oriented x3, not in any acute distress. HEENT: anicteric sclera, , EOMI CVS regular rate, normal r,  no murmur rubs or gallops Chest: clear to auscultation bilaterally, no wheezing, rales or rhonchi Abdomen: soft nontender, nondistended, normal bowel sounds, Extremities: no  clubbing or edema noted bilaterally Skin: no rashes Lymph: no new lymphadenopathy Neuro: nonfocal,.   CBC: CBC Latest Ref Rng 06/11/2014 06/10/2014 04/25/2014  WBC 4.0 - 10.5 K/uL 13.6(H) 8.3 9.4  Hemoglobin 12.0 - 15.0 g/dL 12.3 12.9 13.9  Hematocrit 36.0 - 46.0 % 36.2 38.5 39.5  Platelets 150 - 400 K/uL 273 257 257       BMET  Recent Labs  06/10/14 1858 06/11/14 0922  NA 137 135  K 3.9 3.7  CL 106 107  CO2 24 20  GLUCOSE 106* 121*  BUN 6 <5*  CREATININE 0.55 0.57  CALCIUM 8.6 8.8     Liver Panel   Recent Labs  06/10/14 2313 06/11/14 0922  PROT 6.6 6.5  ALBUMIN 3.4* 3.3*  AST 23 21  ALT 19 17  ALKPHOS 70 60  BILITOT 0.2* 0.7  BILIDIR <0.1  --   IBILI NOT CALCULATED  --        Sedimentation Rate  Recent Labs  06/10/14 2313  ESRSEDRATE 8   C-Reactive Protein  Recent Labs  06/10/14 2313  CRP <0.5*    Micro Results: Recent Results (from the past 720 hour(s))  Blood culture (routine x 2)     Status: None (Preliminary result)   Collection Time: 06/10/14 10:13 PM  Result Value Ref Range Status   Specimen Description BLOOD LEFT ANTECUBITAL  Final   Special Requests BOTTLES DRAWN AEROBIC AND ANAEROBIC 5CC EA  Final   Culture   Final           BLOOD CULTURE RECEIVED NO GROWTH TO DATE CULTURE WILL BE HELD FOR 5 DAYS BEFORE ISSUING A FINAL NEGATIVE REPORT Performed at [facility]    Report Status PENDING   Incomplete  Blood culture (routine x 2)     Status: None (Preliminary result)   Collection Time: 06/10/14 10:17 PM  Result Value Ref Range Status   Specimen Description BLOOD LEFT HAND  Final   Special Requests BOTTLES DRAWN AEROBIC AND ANAEROBIC 5CC EA  Final   Culture   Final           BLOOD CULTURE RECEIVED NO GROWTH TO DATE CULTURE WILL BE HELD FOR 5 DAYS BEFORE ISSUING A FINAL NEGATIVE REPORT Performed at [facility]    Report Status PENDING  Incomplete  MRSA PCR Screening     Status: None   Collection Time: 06/11/14  2:16 AM  Result Value Ref Range Status   MRSA by PCR NEGATIVE NEGATIVE Final    Comment:        The GeneXpert MRSA Assay (FDA approved for NASAL specimens only), is one component of a comprehensive MRSA colonization surveillance program. It is not intended to diagnose MRSA infection nor to guide or monitor treatment for MRSA infections.     Studies/Results: Ct Head Wo Contrast  06/10/2014   CLINICAL DATA:  Headache  EXAM: CT HEAD WITHOUT CONTRAST  TECHNIQUE: Contiguous axial images were obtained from the base of the skull through the vertex without intravenous contrast.  COMPARISON:  None.  FINDINGS: Left temporal lobe hypodensity is identified extending from the white matter to the cortical surface. No adjacent mass effect upon the ventricles is identified. No acute hemorrhage is identified. Ventricles are normal in size. No midline shift. No skull fracture. No soft tissue abnormality. Orbits and paranasal sinuses are intact.  IMPRESSION: Abnormal low-density within the left temporal lobe with a configuration that could indicate acute to subacute stroke given the provided clinical symptom, although underlying mass lesion such as glioblastoma multiforme or possibly herpes encephalopathy can appear similar. Brain MRI with contrast is recommended.  These results were called by telephone at the time of interpretation on 06/10/2014 at 6:33 pm to Dr. Reather Converse, who  verbally acknowledged these results.   Electronically Signed   By: Conchita Paris M.D.   On: 06/10/2014 18:36   Mr Jeri Cos UD Contrast  06/10/2014   CLINICAL DATA:  Headache, weakness and nausea.  EXAM: MRI HEAD WITHOUT AND WITH CONTRAST  TECHNIQUE: Multiplanar, multiecho pulse sequences of the brain and surrounding structures were obtained without and with intravenous contrast.  CONTRAST:  9mL MULTIHANCE GADOBENATE DIMEGLUMINE 529 MG/ML IV SOLN  COMPARISON:  Head CT same day  FINDINGS: There is a 14 mm in diameter ring enhancing lesion at the inferior temporal lobe on the left. Wall thickness is fairly uniform measuring about 2 mm. Internal material shows fluid intensity. Pronounced surrounding vasogenic edema. No restricted diffusion. Remainder the brain is normal. No second lesion. No ischemic infarction. No hemorrhage, hydrocephalus or extra-axial fluid collection. The adjacent temporal bone and mastoid air cells are clear. No inflammatory sinus disease. No pituitary mass. No calvarial abnormality.  IMPRESSION: 14 mm in diameter ring enhancing lesion at the inferior temporal lobe on the left with pronounced regional vasogenic edema. The imaging findings are most consistent with brain abscess. No restricted diffusion however. The differential diagnosis is that of necrotic neoplasm, but that is felt less likely.   Electronically Signed   By: Nelson Chimes M.D.   On: 06/10/2014 21:25      Assessment/Plan:  Principal Problem:   Brain abscess Active Problems:   Paroxysmal supraventricular tachycardia   Depression   GERD (gastroesophageal reflux disease)   Hypertension   Hyperglycemia   Cigarette smoker    Destiny [name] is a 44 y.o. female with  Ring enhancing lesion left temporal lobe being treated empirically for brain abscess with vancomycin and ceftriaxone and metronidazole.  #1 Possible brain abscess:Neurosurgery wanted to see how she responded to abx rather than biopsy, culture at this  point  Agree with current abx  Agree with TEE  Would check panorex of teeth  followup blood cultures  Would nuclear imaging help distinguish between malignancy and infection or provide clues?  #2 Screening: HIV negative.   LOS: 2 days   Alcide Evener 06/12/2014, 1:32 PM

## 2014-06-12 NOTE — Interval H&P Note (Signed)
History and Physical Interval Note:  06/12/2014 2:40 PM  Destiny [name]  has presented today for surgery, with the diagnosis of stroke   The various methods of treatment have been discussed with the patient and family. After consideration of risks, benefits and other options for treatment, the patient has consented to  Procedure(s): TRANSESOPHAGEAL ECHOCARDIOGRAM (TEE) (N/A) as a surgical intervention .  The patient's history has been reviewed, patient examined, no change in status, stable for surgery.  I have reviewed the patient's chart and labs.  Questions were answered to the patient's satisfaction.     TURNER,TRACI R

## 2014-06-12 NOTE — Progress Notes (Signed)
Patient ID: Destiny [name], female   DOB: 1970-05-16, 44 y.o.   MRN: [id number] Neuro stable. No c/o

## 2014-06-12 NOTE — H&P (View-Only) (Signed)
[name] for Infectious Disease    Subjective: No new complaints   Antibiotics:  Anti-infectives    Start     Dose/Rate Route Frequency Ordered Stop   06/11/14 1000  cefTRIAXone (ROCEPHIN) 2 g in dextrose 5 % 50 mL IVPB     2 g 100 mL/hr over 30 Minutes Intravenous Every 12 hours 06/10/14 2307     06/11/14 0800  vancomycin (VANCOCIN) IVPB 750 mg/150 ml premix     750 mg 150 mL/hr over 60 Minutes Intravenous Every 8 hours 06/10/14 2307     06/11/14 0700  metroNIDAZOLE (FLAGYL) IVPB 500 mg     500 mg 100 mL/hr over 60 Minutes Intravenous Every 8 hours 06/10/14 2252     06/10/14 2315  vancomycin (VANCOCIN) 1,250 mg in sodium chloride 0.9 % 250 mL IVPB     1,250 mg 166.7 mL/hr over 90 Minutes Intravenous  Once 06/10/14 2307 06/11/14 0228   06/10/14 2230  metroNIDAZOLE (FLAGYL) IVPB 1 g     1 g 200 mL/hr over 60 Minutes Intravenous  Once 06/10/14 2146 06/11/14 0025   06/10/14 2200  cefTRIAXone (ROCEPHIN) 2 g in dextrose 5 % 50 mL IVPB     2 g 100 mL/hr over 30 Minutes Intravenous  Once 06/10/14 2146 06/10/14 2325      Medications: Scheduled Meds: . cefTRIAXone (ROCEPHIN)  IV  2 g Intravenous Q12H  . metronidazole  500 mg Intravenous Q8H  . nicotine  21 mg Transdermal Daily  . pantoprazole  40 mg Oral Daily  . sodium chloride  3 mL Intravenous Q12H  . vancomycin  750 mg Intravenous Q8H   Continuous Infusions: . sodium chloride 75 mL/hr at 06/11/14 0035   PRN Meds:.acetaminophen **OR** acetaminophen, butalbital-acetaminophen-caffeine, morphine injection, ondansetron **OR** ondansetron (ZOFRAN) IV    Objective: Weight change: 8 lb 4.8 oz (3.765 kg) No intake or output data in the 24 hours ending 06/12/14 1332 Blood pressure 131/75, pulse 88, temperature 98.3 F (36.8 C), temperature source Oral, resp. rate 20, height 5\' 7"  (1.702 m), weight 168 lb 4.8 oz (76.34 kg), SpO2 96 %. Temp:  [98 F (36.7 C)-98.8 F (37.1 C)] 98.3 F (36.8 C) (04/04 0958) Pulse  Rate:  [80-91] 88 (04/04 0958) Resp:  [18-20] 20 (04/04 0958) BP: (120-144)/(64-83) 131/75 mmHg (04/04 0958) SpO2:  [96 %-100 %] 96 % (04/04 0958) Weight:  [168 lb 4.8 oz (76.34 kg)] 168 lb 4.8 oz (76.34 kg) (04/04 4503)  Physical Exam: General: Alert and awake, oriented x3, not in any acute distress. HEENT: anicteric sclera, , EOMI CVS regular rate, normal r,  no murmur rubs or gallops Chest: clear to auscultation bilaterally, no wheezing, rales or rhonchi Abdomen: soft nontender, nondistended, normal bowel sounds, Extremities: no  clubbing or edema noted bilaterally Skin: no rashes Lymph: no new lymphadenopathy Neuro: nonfocal,.   CBC: CBC Latest Ref Rng 06/11/2014 06/10/2014 04/25/2014  WBC 4.0 - 10.5 K/uL 13.6(H) 8.3 9.4  Hemoglobin 12.0 - 15.0 g/dL 12.3 12.9 13.9  Hematocrit 36.0 - 46.0 % 36.2 38.5 39.5  Platelets 150 - 400 K/uL 273 257 257       BMET  Recent Labs  06/10/14 1858 06/11/14 0922  NA 137 135  K 3.9 3.7  CL 106 107  CO2 24 20  GLUCOSE 106* 121*  BUN 6 <5*  CREATININE 0.55 0.57  CALCIUM 8.6 8.8     Liver Panel   Recent Labs  06/10/14 2313 06/11/14 0922  PROT 6.6 6.5  ALBUMIN 3.4* 3.3*  AST 23 21  ALT 19 17  ALKPHOS 70 60  BILITOT 0.2* 0.7  BILIDIR <0.1  --   IBILI NOT CALCULATED  --        Sedimentation Rate  Recent Labs  06/10/14 2313  ESRSEDRATE 8   C-Reactive Protein  Recent Labs  06/10/14 2313  CRP <0.5*    Micro Results: Recent Results (from the past 720 hour(s))  Blood culture (routine x 2)     Status: None (Preliminary result)   Collection Time: 06/10/14 10:13 PM  Result Value Ref Range Status   Specimen Description BLOOD LEFT ANTECUBITAL  Final   Special Requests BOTTLES DRAWN AEROBIC AND ANAEROBIC 5CC EA  Final   Culture   Final           BLOOD CULTURE RECEIVED NO GROWTH TO DATE CULTURE WILL BE HELD FOR 5 DAYS BEFORE ISSUING A FINAL NEGATIVE REPORT Performed at [facility]    Report Status PENDING   Incomplete  Blood culture (routine x 2)     Status: None (Preliminary result)   Collection Time: 06/10/14 10:17 PM  Result Value Ref Range Status   Specimen Description BLOOD LEFT HAND  Final   Special Requests BOTTLES DRAWN AEROBIC AND ANAEROBIC 5CC EA  Final   Culture   Final           BLOOD CULTURE RECEIVED NO GROWTH TO DATE CULTURE WILL BE HELD FOR 5 DAYS BEFORE ISSUING A FINAL NEGATIVE REPORT Performed at [facility]    Report Status PENDING  Incomplete  MRSA PCR Screening     Status: None   Collection Time: 06/11/14  2:16 AM  Result Value Ref Range Status   MRSA by PCR NEGATIVE NEGATIVE Final    Comment:        The GeneXpert MRSA Assay (FDA approved for NASAL specimens only), is one component of a comprehensive MRSA colonization surveillance program. It is not intended to diagnose MRSA infection nor to guide or monitor treatment for MRSA infections.     Studies/Results: Ct Head Wo Contrast  06/10/2014   CLINICAL DATA:  Headache  EXAM: CT HEAD WITHOUT CONTRAST  TECHNIQUE: Contiguous axial images were obtained from the base of the skull through the vertex without intravenous contrast.  COMPARISON:  None.  FINDINGS: Left temporal lobe hypodensity is identified extending from the white matter to the cortical surface. No adjacent mass effect upon the ventricles is identified. No acute hemorrhage is identified. Ventricles are normal in size. No midline shift. No skull fracture. No soft tissue abnormality. Orbits and paranasal sinuses are intact.  IMPRESSION: Abnormal low-density within the left temporal lobe with a configuration that could indicate acute to subacute stroke given the provided clinical symptom, although underlying mass lesion such as glioblastoma multiforme or possibly herpes encephalopathy can appear similar. Brain MRI with contrast is recommended.  These results were called by telephone at the time of interpretation on 06/10/2014 at 6:33 pm to Dr. Reather Converse, who  verbally acknowledged these results.   Electronically Signed   By: Conchita Paris M.D.   On: 06/10/2014 18:36   Mr Jeri Cos KW Contrast  06/10/2014   CLINICAL DATA:  Headache, weakness and nausea.  EXAM: MRI HEAD WITHOUT AND WITH CONTRAST  TECHNIQUE: Multiplanar, multiecho pulse sequences of the brain and surrounding structures were obtained without and with intravenous contrast.  CONTRAST:  2mL MULTIHANCE GADOBENATE DIMEGLUMINE 529 MG/ML IV SOLN  COMPARISON:  Head CT same day  FINDINGS: There is a 14 mm in diameter ring enhancing lesion at the inferior temporal lobe on the left. Wall thickness is fairly uniform measuring about 2 mm. Internal material shows fluid intensity. Pronounced surrounding vasogenic edema. No restricted diffusion. Remainder the brain is normal. No second lesion. No ischemic infarction. No hemorrhage, hydrocephalus or extra-axial fluid collection. The adjacent temporal bone and mastoid air cells are clear. No inflammatory sinus disease. No pituitary mass. No calvarial abnormality.  IMPRESSION: 14 mm in diameter ring enhancing lesion at the inferior temporal lobe on the left with pronounced regional vasogenic edema. The imaging findings are most consistent with brain abscess. No restricted diffusion however. The differential diagnosis is that of necrotic neoplasm, but that is felt less likely.   Electronically Signed   By: Nelson Chimes M.D.   On: 06/10/2014 21:25      Assessment/Plan:  Principal Problem:   Brain abscess Active Problems:   Paroxysmal supraventricular tachycardia   Depression   GERD (gastroesophageal reflux disease)   Hypertension   Hyperglycemia   Cigarette smoker    Destiny [name] is a 44 y.o. female with  Ring enhancing lesion left temporal lobe being treated empirically for brain abscess with vancomycin and ceftriaxone and metronidazole.  #1 Possible brain abscess:Neurosurgery wanted to see how she responded to abx rather than biopsy, culture at this  point  Agree with current abx  Agree with TEE  Would check panorex of teeth  followup blood cultures  Would nuclear imaging help distinguish between malignancy and infection or provide clues?  #2 Screening: HIV negative.   LOS: 2 days   Alcide Evener 06/12/2014, 1:32 PM

## 2014-06-12 NOTE — Progress Notes (Signed)
Destiny [name] UMP:[id number] DOB: 1970-11-28 DOA: 06/10/2014 PCP: Barrie Lyme, FNP  Brief narrative:  44 y/o ? PMH thyroid nodule s/p nodule removal 2010-TSH 0. 3 on admission, SVT s/p Catheter ablation for long RP tachycardia , 01/2014-recent sinus massage in the ED 04/2014 with conversion to sinus tach 110, Htn admit from [facility] Ed 4/2 after having intractable HA and found on CT head to have a brain abcess Tells me that she has been sleeping with her grandchild for the past 2 weeks and he had a bad cough which was then treated with antibiotics and she developed headaches and upper respiratory illnesses recently  Lives with husband of 9 years and is monogamous Has not had an HIV done in the past  Past medical history-As per Problem list Chart reviewed as below-   Consultants:  Cardiology  ID  Procedures:  CT head MRI head  4/2 IMPRESSION: 14 mm in diameter ring enhancing lesion at the inferior temporal lobe on the left with pronounced regional vasogenic edema. The imaging findings are most consistent with brain abscess. No restricted diffusion however. The differential diagnosis is that of necrotic neoplasm, but that is felt less likely.IMPRESSION: 14 mm in diameter ring enhancing lesion at the inferior temporal lobe on the left with pronounced regional vasogenic edema. The imaging findings are most consistent with brain abscess. No restricted diffusion however. The differential diagnosis is that of  necrotic neoplasm, but that is felt less likely.  Antibiotics:  Flagyll 4/2  Vanc 4/2  Ceftriaxone 4/2   Subjective    4/10 bilateral throbbing headache  Has eye pain as well No focal deficit Hungry but aware that she shouldn't eat Was walking around yesterday and is rousable and completely oriented and alert    Objective      Objective: Filed Vitals:   06/11/14 2153 06/12/14 0122 06/12/14 0620 06/12/14 0638  BP: 139/80 127/64 120/83   Pulse: 82 84 80     Temp: 98.5 F (36.9 C) 98.3 F (36.8 C) 98 F (36.7 C)   TempSrc: Oral Oral Oral   Resp: 20 20 20    Height:      Weight:    76.34 kg (168 lb 4.8 oz)  SpO2: 100% 100% 100%    No intake or output data in the 24 hours ending 06/12/14 0830  Exam:  General: Alert pleasant oriented frail Cardiovascular: S1-S2 no murmur rub or gallop sinus Respiratory: Clinically clear no added sound Abdomen: Soft nontender nondistended no rebound Skin grossly intact Neuro power 5/5 reflexes 2/3 finger-nose-finger normal, sensory intact  Data Reviewed: Basic Metabolic Panel:  Recent Labs Lab 06/10/14 1858 06/11/14 0922  NA 137 135  K 3.9 3.7  CL 106 107  CO2 24 20  GLUCOSE 106* 121*  BUN 6 <5*  CREATININE 0.55 0.57  CALCIUM 8.6 8.8   Liver Function Tests:  Recent Labs Lab 06/10/14 2313 06/11/14 0922  AST 23 21  ALT 19 17  ALKPHOS 70 60  BILITOT 0.2* 0.7  PROT 6.6 6.5  ALBUMIN 3.4* 3.3*   No results for input(s): LIPASE, AMYLASE in the last 168 hours. No results for input(s): AMMONIA in the last 168 hours. CBC:  Recent Labs Lab 06/10/14 1858 06/11/14 0922  WBC 8.3 13.6*  NEUTROABS 7.3 11.2*  HGB 12.9 12.3  HCT 38.5 36.2  MCV 90.0 87.9  PLT 257 273   Cardiac Enzymes: No results for input(s): CKTOTAL, CKMB, CKMBINDEX, TROPONINI in the last 168 hours. BNP: Invalid input(s):  POCBNP CBG: No results for input(s): GLUCAP in the last 168 hours.  Recent Results (from the past 240 hour(s))  Blood culture (routine x 2)     Status: None (Preliminary result)   Collection Time: 06/10/14 10:13 PM  Result Value Ref Range Status   Specimen Description BLOOD LEFT ANTECUBITAL  Final   Special Requests BOTTLES DRAWN AEROBIC AND ANAEROBIC 5CC EA  Final   Culture   Final           BLOOD CULTURE RECEIVED NO GROWTH TO DATE CULTURE WILL BE HELD FOR 5 DAYS BEFORE ISSUING A FINAL NEGATIVE REPORT Performed at [facility]    Report Status PENDING  Incomplete  Blood culture  (routine x 2)     Status: None (Preliminary result)   Collection Time: 06/10/14 10:17 PM  Result Value Ref Range Status   Specimen Description BLOOD LEFT HAND  Final   Special Requests BOTTLES DRAWN AEROBIC AND ANAEROBIC 5CC EA  Final   Culture   Final           BLOOD CULTURE RECEIVED NO GROWTH TO DATE CULTURE WILL BE HELD FOR 5 DAYS BEFORE ISSUING A FINAL NEGATIVE REPORT Performed at [facility]    Report Status PENDING  Incomplete  MRSA PCR Screening     Status: None   Collection Time: 06/11/14  2:16 AM  Result Value Ref Range Status   MRSA by PCR NEGATIVE NEGATIVE Final    Comment:        The GeneXpert MRSA Assay (FDA approved for NASAL specimens only), is one component of a comprehensive MRSA colonization surveillance program. It is not intended to diagnose MRSA infection nor to guide or monitor treatment for MRSA infections.      Studies:              All Imaging reviewed and is as per above notation   Scheduled Meds: . cefTRIAXone (ROCEPHIN)  IV  2 g Intravenous Q12H  . metronidazole  500 mg Intravenous Q8H  . nicotine  21 mg Transdermal Daily  . pantoprazole  40 mg Oral Daily  . sodium chloride  3 mL Intravenous Q12H  . vancomycin  750 mg Intravenous Q8H   Continuous Infusions: . sodium chloride 75 mL/hr at 06/11/14 0035     Assessment/Plan: 1. Brain abscess-appreciate neurosurgery input-on empiric Flagyl, ceftriaxone, vancomycin. Usually would need tissue diagnosis?- SBE with TEE to be ruled out for/4, infectious disease  greatly appreciate input. In addition. HIV tes is negative .[name]  If on TEE = no SBE, we will need to potentially speak with neurology and oncology regarding this being a primary CNS lymphoma? 2. Paroxysmal SVT with long RP phenomenon-currently in sinus. Monitor. 3. Smoker-nicotine patch given 4. Reflux-give pantoprazole daily  Code Status: Full Family Communication: No family at bedside Disposition Plan: Inpatient-pending  resolution   Verneita Griffes, MD  [facility] Pager [phone] 06/12/2014, 8:30 AM    LOS: 2 days

## 2014-06-12 NOTE — Progress Notes (Signed)
UR complete.  Mena Lienau RN, MSN 

## 2014-06-13 ENCOUNTER — Encounter (HOSPITAL_COMMUNITY): Payer: Self-pay | Admitting: Cardiology

## 2014-06-13 DIAGNOSIS — C3411 Malignant neoplasm of upper lobe, right bronchus or lung: Secondary | ICD-10-CM | POA: Insufficient documentation

## 2014-06-13 DIAGNOSIS — C342 Malignant neoplasm of middle lobe, bronchus or lung: Secondary | ICD-10-CM

## 2014-06-13 LAB — VANCOMYCIN, TROUGH: Vancomycin Tr: 13.1 ug/mL (ref 10.0–20.0)

## 2014-06-13 LAB — APTT: aPTT: 27 seconds (ref 24–37)

## 2014-06-13 LAB — PROTIME-INR
INR: 1.06 (ref 0.00–1.49)
PROTHROMBIN TIME: 13.9 s (ref 11.6–15.2)

## 2014-06-13 MED ORDER — DEXAMETHASONE 4 MG PO TABS
4.0000 mg | ORAL_TABLET | Freq: Four times a day (QID) | ORAL | Status: DC
  Administered 2014-06-13 – 2014-06-15 (×11): 4 mg via ORAL
  Filled 2014-06-13 (×11): qty 1

## 2014-06-13 MED ORDER — DEXAMETHASONE SODIUM PHOSPHATE 10 MG/ML IJ SOLN
20.0000 mg | Freq: Once | INTRAMUSCULAR | Status: AC
  Administered 2014-06-13: 20 mg via INTRAVENOUS
  Filled 2014-06-13: qty 2

## 2014-06-13 MED ORDER — LORAZEPAM 2 MG/ML IJ SOLN
1.0000 mg | INTRAMUSCULAR | Status: DC | PRN
  Administered 2014-06-13 (×2): 2 mg via INTRAVENOUS
  Filled 2014-06-13 (×2): qty 1

## 2014-06-13 NOTE — Progress Notes (Signed)
06/13/14 1659  What Happened  Was fall witnessed? No  Was patient injured? Unsure  Patient found other (Comment) (N/A)  Found by No one-pt stated they fell  Stated prior activity other (comment) (Family statesd)  Follow Up  MD notified Samtani  Time MD notified 62  Family notified Yes-comment  Time family notified 1703  Additional tests No  Progress note created (see row info) Yes  Adult Fall Risk Assessment  Risk Factor Category (scoring not indicated) Not Applicable  Age 44  Fall History: Fall within 6 months prior to admission 0  Elimination; Bowel and/or Urine Incontinence 0  Elimination; Bowel and/or Urine Urgency/Frequency 0  Medications: includes PCA/Opiates, Anti-convulsants, Anti-hypertensives, Diuretics, Hypnotics, Laxatives, Sedatives, and Psychotropics 0  Patient Care Equipment 2  Mobility-Assistance 0  Mobility-Gait 0  Mobility-Sensory Deficit 0  Cognition-Awareness 0  Cognition-Impulsiveness 0  Cognition-Limitations 0  Total Score 2  Patient's Fall Risk Moderate Fall Risk (6-13 points)  Adult Fall Risk Interventions  Required Bundle Interventions *See Row Information* Moderate fall risk - low and moderate requirements implemented  Additional Interventions Fall risk signage  Vitals  Temp 98.6 F (37 C)  Temp Source Oral  BP 140/80 mmHg  BP Location Left Arm  BP Method Automatic  Patient Position (if appropriate) Lying  Pulse Rate 90  Pulse Rate Source Dinamap  Resp 20  Oxygen Therapy  SpO2 100 %  O2 Device Room Air  Pain Assessment  Pain Assessment 0-10  Pain Score 3  Pain Type Acute pain  Pain Location Leg  Pain Orientation Right  Pain Descriptors / Indicators Aching  Pain Intervention(s) Repositioned  PCA/Epidural/Spinal Assessment  Respiratory Pattern Regular  Neurological  Neuro (WDL) WDL  Level of Consciousness Alert  Orientation Level Oriented X4  Cognition Appropriate at baseline  Speech Clear  R Hand Grip Present  L Hand Grip  Present  R Foot Dorsiflexion Present  L Foot Dorsiflexion Present  R Foot Plantar Flexion Present  L Foot Plantar Flexion Present  RUE Sensation Full sensation  RUE Motor Strength 5  LUE Sensation Full sensation  LUE Motor Strength 5  RLE Sensation Full sensation  RLE Motor Strength 5  LLE Sensation Full sensation  LLE Motor Strength 5  Glasgow Coma Scale  Eye Opening 4  Best Verbal Response (NON-intubated) 5  Best Motor Response 6  Glasgow Coma Scale Score 15  Musculoskeletal  Musculoskeletal (WDL) WDL  Integumentary  Integumentary (WDL) WDL  Staff went to pt room at 1659 and pt was with family. Pt stated she has a fall today but doesn't remember the exact time. She said she hit her leg. However she said it happen before I came in early in the morning. Nothing was said to staff until 1659 when family are in the room. Pt was made independent on admission on 06/10/2014. From now pt is not independent anymore and I educated patient and family about fall and how to call staff for help. Bed alarm is on. Dr Verlon Au was made aware. No new order was given.

## 2014-06-13 NOTE — Progress Notes (Signed)
   Rt lung mass bx scheduled for 4/6 in Radiology  PA will see pt in am See orders npo; cbc; etc..[name]

## 2014-06-13 NOTE — Progress Notes (Signed)
Patient ID: Destiny [name], female   DOB: Oct 26, 1970, 44 y.o.   MRN: [id number] Neuro stable. Waiting for biopsy results

## 2014-06-13 NOTE — Progress Notes (Signed)
Destiny [name] HYQ:[id number] DOB: 12/27/1970 DOA: 06/10/2014 PCP: Barrie Lyme, FNP  Brief narrative:  44 y/o ? PMH thyroid nodule s/p nodule removal 2010-TSH 0. 3 on admission, SVT s/p Catheter ablation for long RP tachycardia , 01/2014-recent sinus massage in the ED 04/2014 with conversion to sinus tach 110, Htn admit from [facility] Ed 4/2 after having intractable HA and found on CT head to have a brain abcess Tells me that she has been sleeping with her grandchild for the past 2 weeks and he had a bad cough which was then treated with antibiotics and she developed headaches and upper respiratory illnesses recently  Lives with husband of 9 years and is monogamous HIV test done this admission was negative I did speak with radiology on 06/12/14 because of persisting headaches despite antibiotics and differential diagnosis of this is actually cancer causing metastases CT abdomen pelvis, chest = 19 mm, 14 mm spiculated nodules right middle lobe Discussed with radiologist who are willing to perform CT-guided biopsy 06/14/14, discussed with oncologist Dr. Burr Medico who will see the patient in consult  Past medical history-As per Problem list Chart reviewed as below-   Consultants:  Cardiology  ID  Oncology  Interventional radiology  Procedures:  CT head MRI head  4/2 IMPRESSION: 14 mm in diameter ring enhancing lesion at the inferior temporal lobe on the left with pronounced regional vasogenic edema. The imaging findings are most consistent with brain abscess. No restricted diffusion however. The differential diagnosis is that of necrotic neoplasm, but that is felt less likely.IMPRESSION: 14 mm in diameter ring enhancing lesion at the inferior temporal lobe on the left with pronounced regional vasogenic edema. The imaging findings are most consistent with brain abscess. No restricted diffusion however. The differential diagnosis is that of  necrotic neoplasm, but that is felt less  likely.  C reports for CT abdomen pelvis and CT chest  Antibiotics:  Flagyll 4/2-4/5  Vanc 4/2-4/5  Ceftriaxone 4/2-/5   Subjective   6/10 headache  tolerating some diet but did not sleep well last night because of headache No nausea no vomiting No focal deficit ambulatory    Objective      Objective: Filed Vitals:   06/13/14 0225 06/13/14 0718 06/13/14 0959 06/13/14 1421  BP: 135/71 111/60 125/71 141/75  Pulse: 90 75 92 99  Temp: 98.4 F (36.9 C) 98.1 F (36.7 C) 98.8 F (37.1 C) 98.1 F (36.7 C)  TempSrc: Oral Oral Oral Oral  Resp: 20 18 19 20   Height:      Weight:      SpO2: 98% 100% 100% 100%   No intake or output data in the 24 hours ending 06/13/14 1621  Exam:  General: Alert pleasant oriented frail Cardiovascular: S1-S2 no murmur rub or gallop sinus Respiratory: Clinically clear no added sound Abdomen: Soft nontender nondistended no rebound Neuro grossly unchanged  Data Reviewed: Basic Metabolic Panel:  Recent Labs Lab 06/10/14 1858 06/11/14 0922  NA 137 135  K 3.9 3.7  CL 106 107  CO2 24 20  GLUCOSE 106* 121*  BUN 6 <5*  CREATININE 0.55 0.57  CALCIUM 8.6 8.8   Liver Function Tests:  Recent Labs Lab 06/10/14 2313 06/11/14 0922  AST 23 21  ALT 19 17  ALKPHOS 70 60  BILITOT 0.2* 0.7  PROT 6.6 6.5  ALBUMIN 3.4* 3.3*   No results for input(s): LIPASE, AMYLASE in the last 168 hours. No results for input(s): AMMONIA in the last 168 hours. CBC:  Recent Labs Lab 06/10/14 1858 06/11/14 0922  WBC 8.3 13.6*  NEUTROABS 7.3 11.2*  HGB 12.9 12.3  HCT 38.5 36.2  MCV 90.0 87.9  PLT 257 273   Cardiac Enzymes: No results for input(s): CKTOTAL, CKMB, CKMBINDEX, TROPONINI in the last 168 hours. BNP: Invalid input(s): POCBNP CBG: No results for input(s): GLUCAP in the last 168 hours.  Recent Results (from the past 240 hour(s))  Blood culture (routine x 2)     Status: None (Preliminary result)   Collection Time: 06/10/14 10:13  PM  Result Value Ref Range Status   Specimen Description BLOOD LEFT ANTECUBITAL  Final   Special Requests BOTTLES DRAWN AEROBIC AND ANAEROBIC 5CC EA  Final   Culture   Final           BLOOD CULTURE RECEIVED NO GROWTH TO DATE CULTURE WILL BE HELD FOR 5 DAYS BEFORE ISSUING A FINAL NEGATIVE REPORT Performed at [facility]    Report Status PENDING  Incomplete  Blood culture (routine x 2)     Status: None (Preliminary result)   Collection Time: 06/10/14 10:17 PM  Result Value Ref Range Status   Specimen Description BLOOD LEFT HAND  Final   Special Requests BOTTLES DRAWN AEROBIC AND ANAEROBIC 5CC EA  Final   Culture   Final           BLOOD CULTURE RECEIVED NO GROWTH TO DATE CULTURE WILL BE HELD FOR 5 DAYS BEFORE ISSUING A FINAL NEGATIVE REPORT Performed at [facility]    Report Status PENDING  Incomplete  MRSA PCR Screening     Status: None   Collection Time: 06/11/14  2:16 AM  Result Value Ref Range Status   MRSA by PCR NEGATIVE NEGATIVE Final    Comment:        The GeneXpert MRSA Assay (FDA approved for NASAL specimens only), is one component of a comprehensive MRSA colonization surveillance program. It is not intended to diagnose MRSA infection nor to guide or monitor treatment for MRSA infections.      Studies:              All Imaging reviewed and is as per above notation   Scheduled Meds: . dexamethasone  4 mg Oral 4 times per day  . nicotine  21 mg Transdermal Daily  . pantoprazole  40 mg Oral Daily  . sodium chloride  3 mL Intravenous Q12H   Continuous Infusions: . sodium chloride 75 mL/hr at 06/11/14 0035     Assessment/Plan: 1. Brain abscess versusmetastatic lung disease  -appreciatconsultant input-on 4/5 we discontinuedic Flagyl, ceftriaxone, vancomycin. Usually would need tissue diagnosis?- SBE with TEE  was ruled out 06/12/14.  In addition. HIV tes is negative.  CT chest confirms possible primary lung cancer, unknown type. Appreciate  interventional radiology, oncology input--patient was given IV Decadron 20 mg and then by mouth Decadron subsequently which has improved some of her pain 2. Paroxysmal SVT with long RP phenomenon-currently in sinus. Monitor. 3. Smoker-nicotine patch given 4. Reflux-give pantoprazole daily  Code Status: Full Family>20 minute discussion with family and patient--patient is understandably upset and concerned that "she is going to die" support offered and chaplain requested to see t he patient as well She will need tissue diagnosis or at least biopsy isposition Plan: inpatient potentially discharge one to 2 days   Murial Beam, MD  [facility] Pager [phone] 06/13/2014, 4:21 PM    LOS: 3 days

## 2014-06-13 NOTE — Consult Note (Signed)
[place]  Telephone:([phone]) [place] NOTE  BURNICE VASSEL                                MR#: [id number]  DOB: [birth year]                       CSN#: [id number]  Referring MD: Dr. Sheliah [facility]  Primary provider :Barrie Lyme, FNP.  Reason for Consult: Metastatic Lung Cancer to Brain   Destiny [name] is a 44 y.o. female admitted on 06/10/14 with 2 week history of constant, frontal headaches, initially believed to be secondary to sinus congestion, not amenable to outpatient management. The patient denied any vertigo, vision changes, seizure activity or gait instability. She denies any dysarthria, or comprehension issues. He denies any fever or chills, but did have night sweats over the last few weeks, and has lost about 8 pounds recently. He denied any chest pain, shortness of breath, cough, or hemoptysis. The patient denied any abdominal pain, but she did have nausea without vomiting. She denies any urinary or bowel incontinence. He denies any bleeding issues. The patient is a smoker.   CT of the head Without contrast on 06/10/14 revealed abnormal low density within the left temporal lobe suspicious for stroke versus a mass lesion. Follow-up MRI of the brain with and without contrast on 06/10/14 showed A 14 mm ring enhancing lesion at the inferior temporal lobe on the left with pronounced regional vasogenic edema, suspicious for brain abscess versus metastasis.She was placed on IV Decadron with improvement of symptoms. 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 CT of the chest was remarkable for 2 adjacent right middle lobe nodules, mildly spiculated, with one of them showing possible involvement of the pleura, suspicious for malignancy. CT of the abdomen and pelvis is negative for metastatic findings. Neurosurgical consultation was obtained, however, until abscess  were to be confirmed, no surgical resection was recommended. She is being treated with IV antibiotics under the direction of Infectious diseases We are asked to see the patient in consultation, regarding the malignant appearing masses in her lung.   PMH:  Past Medical History  Diagnosis Date  . Thyroid nodule   . Depression   . Suicide attempt   . SVT (supraventricular tachycardia)     a. Long RP tachycardia;  b. 01/2014 s/p RFCA.  Osteoarthritis GERD Fibrocystic breast disease- she is up-to-date with mammograms  Surgeries:  Past Surgical History  Procedure Laterality Date  . Thyroid surgery      Removed thyroid nodule, states still has thyroid; 2010  . Supraventricular tachycardia ablation N/A 01/16/2014    Procedure: SUPRAVENTRICULAR TACHYCARDIA ABLATION;  Surgeon: Evans Lance, MD;  Location: [facility] CATH LAB;  Service: Cardiovascular;  Laterality: N/A;  . Tee without cardioversion N/A 06/12/2014    Procedure: TRANSESOPHAGEAL ECHOCARDIOGRAM (TEE);  Surgeon: Sueanne Margarita, MD;  Location: [facility] ENDOSCOPY;  Service: Cardiovascular;  Laterality: N/A;    Allergies: No Known Allergies  Medications:   Scheduled Meds: . dexamethasone  4 mg Oral 4 times per day  . nicotine  21 mg Transdermal Daily  . pantoprazole  40 mg Oral Daily  . sodium chloride  3 mL Intravenous Q12H   Continuous Infusions: . sodium chloride 75 mL/hr at 06/11/14 0035   PRN  Meds:.acetaminophen **OR** acetaminophen, butalbital-acetaminophen-caffeine, LORazepam, morphine injection, ondansetron **OR** ondansetron (ZOFRAN) IV   ROS: See history of present illness for significant positive. All other systems were reviewed with the patient and are negative.   Family History:    Family History  Problem Relation Age of Onset  . Seizures Mother   . Colon cancer Father   . Hypertension Father   History strong family history of depression  No family history of hematological  disorders.  Social History:  reports  that she has been smoking 1-2 packs a day for 28 years.  She does not have any smokeless tobacco history on file. She reports that she drinks alcohol. She reports that she does not use illicit drugs. Married. She has 3 children in good health. Lives in [place]   Physical Exam   ECOG PERFORMANCE STATUS: 1   Filed Vitals:   06/13/14 0718  BP: 111/60  Pulse: 75  Temp: 98.1 F (36.7 C)  Resp: 18   Filed Weights   06/10/14 1450 06/12/14 0638 06/13/14 0129  Weight: 160 lb (72.576 kg) 168 lb 4.8 oz (76.34 kg) 164 lb 11.2 oz (74.707 kg)    GENERAL:alert, no distress and comfortable SKIN: skin color, texture, turgor are normal, no rashes or significant lesions EYES: normal, conjunctiva are pink and non-injected, sclera clear OROPHARYNX:no exudate, no erythema and lips, buccal mucosa, and tongue normal  NECK: supple, thyroid normal size, non-tender, without nodularity LYMPH:  no palpable lymphadenopathy in the cervical, axillary or inguinal LUNGS: clear to auscultation and percussion with normal breathing effort HEART: regular rate & rhythm and no murmurs and no lower extremity edema ABDOMEN:abdomen soft, non-tender and normal bowel sounds Musculoskeletal:no cyanosis of digits and no clubbing  PSYCH: alert & oriented x 3 with fluent speech NEURO: no focal motor/sensory deficits  CBC  Recent Labs Lab 06/10/14 1858 06/11/14 0922  WBC 8.3 13.6*  HGB 12.9 12.3  HCT 38.5 36.2  PLT 257 273  MCV 90.0 87.9  MCH 30.1 29.9  MCHC 33.5 34.0  RDW 14.2 13.8  LYMPHSABS 0.8 1.6  MONOABS 0.1 0.7  EOSABS 0.0 0.0  BASOSABS 0.0 0.0    Anemia panel:  No results for input(s): VITAMINB12, FOLATE, FERRITIN, TIBC, IRON, RETICCTPCT in the last 72 hours.  CMP    Recent Labs Lab 06/10/14 1858 06/10/14 2313 06/11/14 0922  NA 137  --  135  K 3.9  --  3.7  CL 106  --  107  CO2 24  --  20  GLUCOSE 106*  --  121*  BUN 6  --  <5*  CREATININE 0.55  --  0.57  CALCIUM 8.6  --  8.8  AST   --  23 21  ALT  --  19 17  ALKPHOS  --  70 60  BILITOT  --  0.2* 0.7        Component Value Date/Time   BILITOT 0.7 06/11/2014 0922   BILIDIR <0.1 06/10/2014 2313   IBILI NOT CALCULATED 06/10/2014 2313      Imaging Studies:  Dg Orthopantogram  06/12/2014   CLINICAL DATA:  Brain abscess.  Evaluate for dental disease.  EXAM: ORTHOPANTOGRAM/PANORAMIC  COMPARISON:  None.  FINDINGS: No significant dental disease is demonstrated. No obvious dental caries or periapical abscess.  IMPRESSION: No significant dental disease.   Electronically Signed   By: Marijo Sanes M.D.   On: 06/12/2014 20:00   Ct Head Wo Contrast  06/10/2014   CLINICAL DATA:  Headache  EXAM: CT HEAD WITHOUT CONTRAST  TECHNIQUE: Contiguous axial images were obtained from the base of the skull through the vertex without intravenous contrast.  COMPARISON:  None.  FINDINGS: Left temporal lobe hypodensity is identified extending from the white matter to the cortical surface. No adjacent mass effect upon the ventricles is identified. No acute hemorrhage is identified. Ventricles are normal in size. No midline shift. No skull fracture. No soft tissue abnormality. Orbits and paranasal sinuses are intact.  IMPRESSION: Abnormal low-density within the left temporal lobe with a configuration that could indicate acute to subacute stroke given the provided clinical symptom, although underlying mass lesion such as glioblastoma multiforme or possibly herpes encephalopathy can appear similar. Brain MRI with contrast is recommended.  These results were called by telephone at the time of interpretation on 06/10/2014 at 6:33 pm to Dr. Reather Converse, who verbally acknowledged these results.   Electronically Signed   By: Conchita Paris M.D.   On: 06/10/2014 18:36   Ct Chest W Contrast  06/12/2014   CLINICAL DATA:  Chest pain.  Query cancer.  EXAM: CT CHEST, ABDOMEN, AND PELVIS WITH CONTRAST  TECHNIQUE: Multidetector CT imaging of the chest, abdomen and pelvis was  performed following the standard protocol during bolus administration of intravenous contrast.  CONTRAST:  118mL OMNIPAQUE IOHEXOL 300 MG/ML SOLN, 1 OMNIPAQUE IOHEXOL 300 MG/ML SOLN  COMPARISON:  CT abdomen and pelvis 10/17/2005  FINDINGS: CT CHEST FINDINGS  Two adjacent right middle lobe nodules are demonstrated, 1 measuring 19 mm in the other 16 mm in maximal diameter. Mild spiculation of the margins. The lateral most lesion appears to extend to the pleural surface with probable involvement of the pleura. Appearance is worrisome for malignancy. This could represent primary or metastatic lesion. Mild emphysematous changes in the lungs. No focal airspace disease. No pneumothorax. No pleural effusions.  Normal heart size. Coronary artery calcifications. Normal caliber thoracic aorta. No aortic aneurysm or dissection. Great vessel origins are patent. No significant lymphadenopathy in the chest. Esophagus is decompressed.  CT ABDOMEN AND PELVIS FINDINGS  Focal fatty infiltration adjacent to the falciform ligament of the liver. No other focal liver lesions. The gallbladder, spleen, pancreas, adrenal glands, kidneys, abdominal aorta, inferior vena cava, and retroperitoneal lymph nodes are unremarkable. Stomach, small bowel, and colon are not abnormally distended. Fluid fills the colon. No free air or free fluid in the abdomen.  Pelvis: Uterus appears enlarged and nodular consistent with fibroids. Endometrium is mildly prominent, nonspecific. Ovaries are not enlarged. Bladder wall is not thickened. No free or loculated pelvic fluid collections. No pelvic mass or lymphadenopathy.  Bones:  Normal alignment of the spine.  No destructive bone lesions.  IMPRESSION: Two adjacent right middle lobe nodules measuring 19 and 16 mm maximal diameter. Mild spiculation. Appearances worrisome for malignancy. No evidence of significant lymphadenopathy in the chest. No evidence of metastatic disease in the abdomen or pelvis. Nodular  enlargement of the uterus suggests fibroids.   Electronically Signed   By: Lucienne Capers M.D.   On: 06/12/2014 22:18   Mr Jeri Cos YT Contrast  06/10/2014   CLINICAL DATA:  Headache, weakness and nausea.  EXAM: MRI HEAD WITHOUT AND WITH CONTRAST  TECHNIQUE: Multiplanar, multiecho pulse sequences of the brain and surrounding structures were obtained without and with intravenous contrast.  CONTRAST:  69mL MULTIHANCE GADOBENATE DIMEGLUMINE 529 MG/ML IV SOLN  COMPARISON:  Head CT same day  FINDINGS: There is a 14 mm in diameter ring enhancing lesion at the inferior temporal lobe  on the left. Wall thickness is fairly uniform measuring about 2 mm. Internal material shows fluid intensity. Pronounced surrounding vasogenic edema. No restricted diffusion. Remainder the brain is normal. No second lesion. No ischemic infarction. No hemorrhage, hydrocephalus or extra-axial fluid collection. The adjacent temporal bone and mastoid air cells are clear. No inflammatory sinus disease. No pituitary mass. No calvarial abnormality.  IMPRESSION: 14 mm in diameter ring enhancing lesion at the inferior temporal lobe on the left with pronounced regional vasogenic edema. The imaging findings are most consistent with brain abscess. No restricted diffusion however. The differential diagnosis is that of necrotic neoplasm, but that is felt less likely.   Electronically Signed   By: Nelson Chimes M.D.   On: 06/10/2014 21:25   Ct Abdomen Pelvis W Contrast  06/12/2014   CLINICAL DATA:  Chest pain.  Query cancer.  EXAM: CT CHEST, ABDOMEN, AND PELVIS WITH CONTRAST  TECHNIQUE: Multidetector CT imaging of the chest, abdomen and pelvis was performed following the standard protocol during bolus administration of intravenous contrast.  CONTRAST:  156mL OMNIPAQUE IOHEXOL 300 MG/ML SOLN, 1 OMNIPAQUE IOHEXOL 300 MG/ML SOLN  COMPARISON:  CT abdomen and pelvis 10/17/2005  FINDINGS: CT CHEST FINDINGS  Two adjacent right middle lobe nodules are  demonstrated, 1 measuring 19 mm in the other 16 mm in maximal diameter. Mild spiculation of the margins. The lateral most lesion appears to extend to the pleural surface with probable involvement of the pleura. Appearance is worrisome for malignancy. This could represent primary or metastatic lesion. Mild emphysematous changes in the lungs. No focal airspace disease. No pneumothorax. No pleural effusions.  Normal heart size. Coronary artery calcifications. Normal caliber thoracic aorta. No aortic aneurysm or dissection. Great vessel origins are patent. No significant lymphadenopathy in the chest. Esophagus is decompressed.  CT ABDOMEN AND PELVIS FINDINGS  Focal fatty infiltration adjacent to the falciform ligament of the liver. No other focal liver lesions. The gallbladder, spleen, pancreas, adrenal glands, kidneys, abdominal aorta, inferior vena cava, and retroperitoneal lymph nodes are unremarkable. Stomach, small bowel, and colon are not abnormally distended. Fluid fills the colon. No free air or free fluid in the abdomen.  Pelvis: Uterus appears enlarged and nodular consistent with fibroids. Endometrium is mildly prominent, nonspecific. Ovaries are not enlarged. Bladder wall is not thickened. No free or loculated pelvic fluid collections. No pelvic mass or lymphadenopathy.  Bones:  Normal alignment of the spine.  No destructive bone lesions.  IMPRESSION: Two adjacent right middle lobe nodules measuring 19 and 16 mm maximal diameter. Mild spiculation. Appearances worrisome for malignancy. No evidence of significant lymphadenopathy in the chest. No evidence of metastatic disease in the abdomen or pelvis. Nodular enlargement of the uterus suggests fibroids.   Electronically Signed   By: Lucienne Capers M.D.   On: 06/12/2014 22:18     Assessment/Plan: 44 y.o.   Brain abscess versus malignancy Lung masses The patient was admitted with increasing headaches, and was found per CT of the head and MRI of the  brain to have a ring enhancing lesion with surrounding vasogenic edema suspicious for abscess versus malignancy The patient was placed on IV Decadron, with improvement of symptoms. I D is involved, having placed the patient on IV antibiotics, and close monitoring. Cultures are pending. The pertinent testing including HIV is pending However, a CT of the chest shows suspicious masses on the right middle lobe, mildly spiculated, worrisome for malignancy. This appeared to be localized, with no significant lymphadenopathy, or evidence of metastatic disease  elsewhere. No tissue diagnosis has been obtained to date. Will need to have a biopsy, once cleared by cardiology. Consider radiation oncology evaluation once the abscess is resolved. Treatment will depend on the diagnosis.  Leukocytosis This is due to Decadron and possible infection  Cultures are pending, ID is on board No intervention is indicated at this time Will continue to monitor  History of thyroid nodule Status post removal in 2010 Diagnosis assisting with solitary nontoxic uni-nodular goiter  History of tobacco abuse Tobacco cessation program is recommended  DVT prophylaxis On SCDs  Full Code   Other medical issues Including history of SVTs as per admitting team     [facility] E, PA-C 06/13/2014 8:42 AM   Attending addendum: I have seen the patient, examined her. I agree with the assessment and and plan and have edited the notes.   44 year old African-American female was 20 pack year smoking history presents with headaches, nausea and 8 pound weight loss. CT scan showed 2 adjacent lung nodules in the right middle lobe measuring 1.6 and 1.9 cm, and a 1.4cm ring enhancing brain lesions with surrounding edema. This is suspicious for metastatic lung cancer. She was initially worked up for brain abscess and infection, and ID workup has been negative so far.  I spoke with interventional radiology this afternoon, and he agreed  to biopsy the right middle lobe lung lesion.  Recommendations -Waiting for lung mass biopsy results. -If biopsy confirms cancer, and neurosurgery does not offer surgery, she will likely need brain radiation. I'll arrange her radiation oncology referral -Dexamethasone 20 mg IV followed by 4 every 6 hours to reduce vasogenic edema -I'll meet her again after her biopsy result returns. I'll arrange her clinical follow-up with [place].  I had a long conversation with patient and her family members, regarding her possible diagnosis and treatment options. I'll follow-up.  Truitt Merle  06/13/2014

## 2014-06-13 NOTE — Progress Notes (Deleted)
06/13/14 1659  What Happened  Was fall witnessed? No  Was patient injured? Unsure  Patient found other (Comment) (N/A)  Found by No one-pt stated they fell  Stated prior activity other (comment) (Family statesd)  Follow Up  MD notified Samtani  Time MD notified 45  Family notified Yes-comment  Time family notified 1703  Additional tests No  Progress note created (see row info) Yes  Adult Fall Risk Assessment  Risk Factor Category (scoring not indicated) Not Applicable  Age 44  Fall History: Fall within 6 months prior to admission 5  Elimination; Bowel and/or Urine Incontinence 0  Elimination; Bowel and/or Urine Urgency/Frequency 0  Medications: includes PCA/Opiates, Anti-convulsants, Anti-hypertensives, Diuretics, Hypnotics, Laxatives, Sedatives, and Psychotropics 0  Patient Care Equipment 2  Mobility-Assistance 0  Mobility-Gait 0  Mobility-Sensory Deficit 0  Cognition-Awareness 0  Cognition-Impulsiveness 0  Cognition-Limitations 0  Total Score 7  Patient's Fall Risk Moderate Fall Risk (6-13 points)  Adult Fall Risk Interventions  Required Bundle Interventions *See Row Information* Moderate fall risk - low and moderate requirements implemented  Additional Interventions Fall risk signage  Vitals  Temp 98.6 F (37 C)  Temp Source Oral  BP 140/80 mmHg  BP Location Left Arm  BP Method Automatic  Patient Position (if appropriate) Lying  Pulse Rate 90  Pulse Rate Source Dinamap  Resp 20  Oxygen Therapy  SpO2 100 %  O2 Device Room Air  Pain Assessment  Pain Assessment 0-10  Pain Score 3  Pain Type Acute pain  Pain Location Leg  Pain Orientation Right  Pain Descriptors / Indicators Aching  Pain Intervention(s) Repositioned  PCA/Epidural/Spinal Assessment  Respiratory Pattern Regular  Neurological  Neuro (WDL) WDL  Level of Consciousness Alert  Orientation Level Oriented X4  Cognition Appropriate at baseline  Speech Clear  R Hand Grip Present  L Hand Grip  Present  R Foot Dorsiflexion Present  L Foot Dorsiflexion Present  R Foot Plantar Flexion Present  L Foot Plantar Flexion Present  RUE Sensation Full sensation  RUE Motor Strength 5  LUE Sensation Full sensation  LUE Motor Strength 5  RLE Sensation Full sensation  RLE Motor Strength 5  LLE Sensation Full sensation  LLE Motor Strength 5  Glasgow Coma Scale  Eye Opening 4  Best Verbal Response (NON-intubated) 5  Best Motor Response 6  Glasgow Coma Scale Score 15  Musculoskeletal  Musculoskeletal (WDL) WDL  Integumentary  Integumentary (WDL) WDL  Staff went to pt room at 1659 and pt was with family. Pt stated she has a fall today but doesn't remember the exact time. She said she hit her leg. However she said it happen before I came in early in the morning. Nothing was said to staff until 1659 when family are in the room. Pt was made independent on admission on 06/10/2014.  From now pt is not independent anymore and I educated patient and family about fall and how to call staff for help. Bed alarm is on. Dr Verlon Au was made aware. No new order was given.

## 2014-06-13 NOTE — Progress Notes (Signed)
Chaplain responded to page from pt nurse; consult from palliative care also placed. Upon arrival pt sitting up in bed crying and talking to a family member on the phone. Pt reported that she got a diagnosis of cancer this morning and does not know if it is treatable. Pt repeated the refrain "I am not ready to die" throughout the conversation and that she is scared. Pt states that her family has a history of cancer and that she told herself when considering the possibility of cancer for herself that "If it can be treated I want to go through it." Chaplain provided empathetic listening, emotional support, and offered prayer. Chaplain told pt that she will follow up, but if she needs support before follow up that she can ask the nurse to page a chaplain at any time. Pt stated that she would appreciate the continued support.    06/13/14 0900  Clinical Encounter Type  Visited With Patient;Health care provider  Visit Type Initial;Spiritual support  Referral From Palliative care team;Nurse  Consult/Referral To Chaplain  Recommendations Follow  Spiritual Encounters  Spiritual Needs Emotional;Prayer  Stress Factors  Patient Stress Factors Loss;Major life changes;Health changes;Family relationships  Abdirizak Richison, Epifanio Lesches 06/13/2014 9:13 AM

## 2014-06-14 ENCOUNTER — Inpatient Hospital Stay (HOSPITAL_COMMUNITY): Payer: Medicaid Other

## 2014-06-14 ENCOUNTER — Encounter (HOSPITAL_COMMUNITY): Payer: Self-pay | Admitting: Radiology

## 2014-06-14 DIAGNOSIS — R918 Other nonspecific abnormal finding of lung field: Secondary | ICD-10-CM | POA: Insufficient documentation

## 2014-06-14 LAB — CBC
HCT: 36.7 % (ref 36.0–46.0)
HEMOGLOBIN: 12.6 g/dL (ref 12.0–15.0)
MCH: 30.2 pg (ref 26.0–34.0)
MCHC: 34.3 g/dL (ref 30.0–36.0)
MCV: 88 fL (ref 78.0–100.0)
Platelets: 275 10*3/uL (ref 150–400)
RBC: 4.17 MIL/uL (ref 3.87–5.11)
RDW: 13.9 % (ref 11.5–15.5)
WBC: 12.9 10*3/uL — AB (ref 4.0–10.5)

## 2014-06-14 LAB — PROTIME-INR
INR: 1.09 (ref 0.00–1.49)
Prothrombin Time: 14.2 seconds (ref 11.6–15.2)

## 2014-06-14 LAB — APTT: aPTT: 27 seconds (ref 24–37)

## 2014-06-14 MED ORDER — MIDAZOLAM HCL 2 MG/2ML IJ SOLN
INTRAMUSCULAR | Status: AC
  Filled 2014-06-14: qty 4

## 2014-06-14 MED ORDER — MIDAZOLAM HCL 2 MG/2ML IJ SOLN
INTRAMUSCULAR | Status: AC | PRN
Start: 2014-06-14 — End: 2014-06-14
  Administered 2014-06-14 (×2): 1 mg via INTRAVENOUS

## 2014-06-14 MED ORDER — LIDOCAINE HCL 1 % IJ SOLN
INTRAMUSCULAR | Status: AC
  Filled 2014-06-14: qty 20

## 2014-06-14 MED ORDER — FENTANYL CITRATE 0.05 MG/ML IJ SOLN
INTRAMUSCULAR | Status: AC
  Filled 2014-06-14: qty 4

## 2014-06-14 MED ORDER — FENTANYL CITRATE 0.05 MG/ML IJ SOLN
INTRAMUSCULAR | Status: AC | PRN
  Administered 2014-06-14 (×3): 50 ug via INTRAVENOUS

## 2014-06-14 NOTE — H&P (Signed)
Reason for Consult: Lung nodules with brain abscess/neoplasm. Chief Complaint: Chief Complaint  Patient presents with  . Headache   Referring Physician(s): [facility]/Oncology/Neurosurgery  History of Present Illness: Destiny [name] is a 44 y.o. female who presented with weakness, frontal headaches x 2 weeks, weight loss and nausea. 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 CT imaging revealed (2) RML lung nodules, no other areas of disease seen. The patient admits to ongoing tobacco use and has used tobacco since age 61. IR received request for biopsy of RML lung nodule. She denies any chest pain, shortness of breath or palpitations. She denies any active signs of bleeding or excessive bruising. The patient denies any history of sleep apnea or chronic oxygen use. She denies any known complications to sedation.    Past Medical History  Diagnosis Date  . Thyroid nodule   . Depression   . Suicide attempt   . SVT (supraventricular tachycardia)     a. Long RP tachycardia;  b. 01/2014 s/p RFCA.    Past Surgical History  Procedure Laterality Date  . Thyroid surgery      Removed thyroid nodule, states still has thyroid; 2010  . Supraventricular tachycardia ablation N/A 01/16/2014    Procedure: SUPRAVENTRICULAR TACHYCARDIA ABLATION;  Surgeon: Evans Lance, MD;  Location: [facility] CATH LAB;  Service: Cardiovascular;  Laterality: N/A;  . Tee without cardioversion N/A 06/12/2014    Procedure: TRANSESOPHAGEAL ECHOCARDIOGRAM (TEE);  Surgeon: Sueanne Margarita, MD;  Location: [facility] ENDOSCOPY;  Service: Cardiovascular;  Laterality: N/A;    Allergies: Review of patient's allergies indicates no known allergies.  Medications: Prior to Admission medications   Medication Sig Start Date End Date Taking? Authorizing Provider  aspirin EC 81 MG tablet Take 1 tablet (81 mg total) by mouth daily. 01/16/14  Yes Rogelia Mire, NP  cycloSPORINE (RESTASIS) 0.05 % ophthalmic emulsion Place 1 drop into  both eyes 2 (two) times daily.   Yes Historical Provider, MD  lisinopril-hydrochlorothiazide (PRINZIDE,ZESTORETIC) 10-12.5 MG per tablet Take 1 tablet by mouth daily.   Yes Historical Provider, MD  Olopatadine HCl 0.7 % SOLN Apply 1 drop to eye daily.   Yes Historical Provider, MD  omeprazole (PRILOSEC) 20 MG capsule Take 20 mg by mouth daily as needed (for heartburn).   Yes Historical Provider, MD  amoxicillin (AMOXIL) 500 MG capsule Take 500 mg by mouth 3 (three) times daily.    Historical Provider, MD     Family History  Problem Relation Age of Onset  . Seizures Mother   . Colon cancer Father   . Hypertension Father     History   Social History  . Marital Status: Married    Spouse Name: N/A  . Number of Children: 4  . Years of Education: N/A   Social History Main Topics  . Smoking status: Current Every Day Smoker -- 1.00 packs/day  . Smokeless tobacco: Not on file  . Alcohol Use: Yes     Comment: occ  . Drug Use: No  . Sexual Activity: Not on file   Other Topics Concern  . None   Social History Narrative    Review of Systems: A 12 point ROS discussed and pertinent positives are indicated in the HPI above.  All other systems are negative.  Review of Systems  Constitutional: Positive for unexpected weight change.  Respiratory: Negative for cough and shortness of breath.        Denies hemoptysis   Neurological: Positive for weakness and headaches.  Vital Signs: BP 144/91 mmHg  Pulse 83  Temp(Src) 98.1 F (36.7 C) (Oral)  Resp 18  Ht 5\' 7"  (1.702 m)  Wt 163 lb 1.6 oz (73.982 kg)  BMI 25.54 kg/m2  SpO2 100%  LMP 06/04/2014  Physical Exam  Constitutional: She is oriented to person, place, and time. No distress.  HENT:  Head: Normocephalic and atraumatic.  Neck: No tracheal deviation present.  Cardiovascular: Normal rate and regular rhythm.  Exam reveals no gallop and no friction rub.   No murmur heard. Pulmonary/Chest: Effort normal and breath sounds  normal. No respiratory distress. She has no wheezes. She has no rales.  Abdominal: Soft. Bowel sounds are normal. She exhibits no distension. There is no tenderness.  Neurological: She is alert and oriented to person, place, and time.  Skin: She is not diaphoretic.    Mallampati Score:  MD Evaluation Airway: WNL Heart: WNL Abdomen: WNL Chest/ Lungs: WNL ASA  Classification: 3 Mallampati/Airway Score: Two  Imaging: Dg Orthopantogram  06/12/2014   CLINICAL DATA:  Brain abscess.  Evaluate for dental disease.  EXAM: ORTHOPANTOGRAM/PANORAMIC  COMPARISON:  None.  FINDINGS: No significant dental disease is demonstrated. No obvious dental caries or periapical abscess.  IMPRESSION: No significant dental disease.   Electronically Signed   By: Marijo Sanes M.D.   On: 06/12/2014 20:00   Ct Head Wo Contrast  06/10/2014   CLINICAL DATA:  Headache  EXAM: CT HEAD WITHOUT CONTRAST  TECHNIQUE: Contiguous axial images were obtained from the base of the skull through the vertex without intravenous contrast.  COMPARISON:  None.  FINDINGS: Left temporal lobe hypodensity is identified extending from the white matter to the cortical surface. No adjacent mass effect upon the ventricles is identified. No acute hemorrhage is identified. Ventricles are normal in size. No midline shift. No skull fracture. No soft tissue abnormality. Orbits and paranasal sinuses are intact.  IMPRESSION: Abnormal low-density within the left temporal lobe with a configuration that could indicate acute to subacute stroke given the provided clinical symptom, although underlying mass lesion such as glioblastoma multiforme or possibly herpes encephalopathy can appear similar. Brain MRI with contrast is recommended.  These results were called by telephone at the time of interpretation on 06/10/2014 at 6:33 pm to Dr. Reather Converse, who verbally acknowledged these results.   Electronically Signed   By: Conchita Paris M.D.   On: 06/10/2014 18:36   Ct Chest W  Contrast  06/12/2014   CLINICAL DATA:  Chest pain.  Query cancer.  EXAM: CT CHEST, ABDOMEN, AND PELVIS WITH CONTRAST  TECHNIQUE: Multidetector CT imaging of the chest, abdomen and pelvis was performed following the standard protocol during bolus administration of intravenous contrast.  CONTRAST:  147mL OMNIPAQUE IOHEXOL 300 MG/ML SOLN, 1 OMNIPAQUE IOHEXOL 300 MG/ML SOLN  COMPARISON:  CT abdomen and pelvis 10/17/2005  FINDINGS: CT CHEST FINDINGS  Two adjacent right middle lobe nodules are demonstrated, 1 measuring 19 mm in the other 16 mm in maximal diameter. Mild spiculation of the margins. The lateral most lesion appears to extend to the pleural surface with probable involvement of the pleura. Appearance is worrisome for malignancy. This could represent primary or metastatic lesion. Mild emphysematous changes in the lungs. No focal airspace disease. No pneumothorax. No pleural effusions.  Normal heart size. Coronary artery calcifications. Normal caliber thoracic aorta. No aortic aneurysm or dissection. Great vessel origins are patent. No significant lymphadenopathy in the chest. Esophagus is decompressed.  CT ABDOMEN AND PELVIS FINDINGS  Focal fatty infiltration adjacent  to the falciform ligament of the liver. No other focal liver lesions. The gallbladder, spleen, pancreas, adrenal glands, kidneys, abdominal aorta, inferior vena cava, and retroperitoneal lymph nodes are unremarkable. Stomach, small bowel, and colon are not abnormally distended. Fluid fills the colon. No free air or free fluid in the abdomen.  Pelvis: Uterus appears enlarged and nodular consistent with fibroids. Endometrium is mildly prominent, nonspecific. Ovaries are not enlarged. Bladder wall is not thickened. No free or loculated pelvic fluid collections. No pelvic mass or lymphadenopathy.  Bones:  Normal alignment of the spine.  No destructive bone lesions.  IMPRESSION: Two adjacent right middle lobe nodules measuring 19 and 16 mm maximal  diameter. Mild spiculation. Appearances worrisome for malignancy. No evidence of significant lymphadenopathy in the chest. No evidence of metastatic disease in the abdomen or pelvis. Nodular enlargement of the uterus suggests fibroids.   Electronically Signed   By: Lucienne Capers M.D.   On: 06/12/2014 22:18   Mr Jeri Cos TI Contrast  06/10/2014   CLINICAL DATA:  Headache, weakness and nausea.  EXAM: MRI HEAD WITHOUT AND WITH CONTRAST  TECHNIQUE: Multiplanar, multiecho pulse sequences of the brain and surrounding structures were obtained without and with intravenous contrast.  CONTRAST:  45mL MULTIHANCE GADOBENATE DIMEGLUMINE 529 MG/ML IV SOLN  COMPARISON:  Head CT same day  FINDINGS: There is a 14 mm in diameter ring enhancing lesion at the inferior temporal lobe on the left. Wall thickness is fairly uniform measuring about 2 mm. Internal material shows fluid intensity. Pronounced surrounding vasogenic edema. No restricted diffusion. Remainder the brain is normal. No second lesion. No ischemic infarction. No hemorrhage, hydrocephalus or extra-axial fluid collection. The adjacent temporal bone and mastoid air cells are clear. No inflammatory sinus disease. No pituitary mass. No calvarial abnormality.  IMPRESSION: 14 mm in diameter ring enhancing lesion at the inferior temporal lobe on the left with pronounced regional vasogenic edema. The imaging findings are most consistent with brain abscess. No restricted diffusion however. The differential diagnosis is that of necrotic neoplasm, but that is felt less likely.   Electronically Signed   By: Nelson Chimes M.D.   On: 06/10/2014 21:25   Ct Abdomen Pelvis W Contrast  06/12/2014   CLINICAL DATA:  Chest pain.  Query cancer.  EXAM: CT CHEST, ABDOMEN, AND PELVIS WITH CONTRAST  TECHNIQUE: Multidetector CT imaging of the chest, abdomen and pelvis was performed following the standard protocol during bolus administration of intravenous contrast.  CONTRAST:  155mL OMNIPAQUE  IOHEXOL 300 MG/ML SOLN, 1 OMNIPAQUE IOHEXOL 300 MG/ML SOLN  COMPARISON:  CT abdomen and pelvis 10/17/2005  FINDINGS: CT CHEST FINDINGS  Two adjacent right middle lobe nodules are demonstrated, 1 measuring 19 mm in the other 16 mm in maximal diameter. Mild spiculation of the margins. The lateral most lesion appears to extend to the pleural surface with probable involvement of the pleura. Appearance is worrisome for malignancy. This could represent primary or metastatic lesion. Mild emphysematous changes in the lungs. No focal airspace disease. No pneumothorax. No pleural effusions.  Normal heart size. Coronary artery calcifications. Normal caliber thoracic aorta. No aortic aneurysm or dissection. Great vessel origins are patent. No significant lymphadenopathy in the chest. Esophagus is decompressed.  CT ABDOMEN AND PELVIS FINDINGS  Focal fatty infiltration adjacent to the falciform ligament of the liver. No other focal liver lesions. The gallbladder, spleen, pancreas, adrenal glands, kidneys, abdominal aorta, inferior vena cava, and retroperitoneal lymph nodes are unremarkable. Stomach, small bowel, and colon are not abnormally  distended. Fluid fills the colon. No free air or free fluid in the abdomen.  Pelvis: Uterus appears enlarged and nodular consistent with fibroids. Endometrium is mildly prominent, nonspecific. Ovaries are not enlarged. Bladder wall is not thickened. No free or loculated pelvic fluid collections. No pelvic mass or lymphadenopathy.  Bones:  Normal alignment of the spine.  No destructive bone lesions.  IMPRESSION: Two adjacent right middle lobe nodules measuring 19 and 16 mm maximal diameter. Mild spiculation. Appearances worrisome for malignancy. No evidence of significant lymphadenopathy in the chest. No evidence of metastatic disease in the abdomen or pelvis. Nodular enlargement of the uterus suggests fibroids.   Electronically Signed   By: Lucienne Capers M.D.   On: 06/12/2014 22:18     Labs:  CBC:  Recent Labs  04/25/14 1817 06/10/14 1858 06/11/14 0922 06/14/14 0430  WBC 9.4 8.3 13.6* 12.9*  HGB 13.9 12.9 12.3 12.6  HCT 39.5 38.5 36.2 36.7  PLT 257 257 273 275    COAGS:  Recent Labs  01/16/14 0533 06/13/14 1310 06/14/14 0430  INR 0.98 1.06 1.09  APTT  --  27 27    BMP:  Recent Labs  01/16/14 0533 04/25/14 1817 06/10/14 1858 06/11/14 0922  NA 138 136 137 135  K 3.8 3.9 3.9 3.7  CL 104 105 106 107  CO2 21 24 24 20   GLUCOSE 95 90 106* 121*  BUN 11 10 6  <5*  CALCIUM 8.9 8.7 8.6 8.8  CREATININE 0.65 0.71 0.55 0.57  GFRNONAA >90 >90 >90 >90  GFRAA >90 >90 >90 >90    LIVER FUNCTION TESTS:  Recent Labs  06/10/14 2313 06/11/14 0922  BILITOT 0.2* 0.7  AST 23 21  ALT 19 17  ALKPHOS 70 60  PROT 6.6 6.5  ALBUMIN 3.4* 3.3*   Assessment and Plan: Frontal headaches x 2 weeks with nausea 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  Request for biopsy of RML lung nodule with moderate sedation The patient has been NPO, no blood thinners taken, labs and vitals have been reviewed. HIV non reactive, TEE with no vegetations or source of emboli.  Risks and Benefits discussed with the patient including, but not limited to bleeding, hemoptysis, respiratory failure requiring intubation, infection, pneumothorax requiring chest tube placement, stroke from air embolism or even death. All of the patient's questions were answered, patient is agreeable to proceed. Consent signed and in chart. History of thyroid nodule resection 2010 History of SVT s/p ablation.    Thank you for this interesting consult.  I greatly enjoyed meeting Destiny [name] and look forward to participating in their care.  SignedHedy Jacob 06/14/2014, 8:24 AM   I spent a total of 40 Minutes in face to face  in clinical consultation, greater than 50% of which was counseling/coordinating care for right middle lobe lung nodule.

## 2014-06-14 NOTE — Progress Notes (Signed)
Chaplain introduced herself to pt family. Chaplain explained her role of supporting pts and families throughout hospitalization. Family very concerned about pt prognosis and treatment. Chaplain intends to follow up with pt later today. Dim Meisinger, Barbette Hair, Chaplain 06/14/2014 10:07 AM

## 2014-06-14 NOTE — Progress Notes (Signed)
Patient ID: Destiny [name], female   DOB: 02/21/1971, 44 y.o.   MRN: [id number] nuro stable. Getting a lung biopsy

## 2014-06-14 NOTE — Progress Notes (Signed)
Chaplain followed up upon pt request. Pt reports that she is "scared and stressed." Pt reports that she does not want to go back to her home because she finds it stressful there with her husband and other relatives. Pt remarked that she wants to make some changes in her life including adopting some more coping strategies, other than the drinking and smoking that she has used in the past. She believes that God is trying to teach her something through this and that she is learning to forgive herself. Pt desires for the people closest to her to listen to her feelings. Chaplain encouraged pt to keep making healthy boundaries and advocating for herself. Pt has many questions for medical team and chaplain encouraged pt to write down her questions. Pt took a phone call from another family member and chaplain needed to leave.  Chaplain will continue to follow. Page chaplain as needed.     06/14/14 1400  Clinical Encounter Type  Visited With Patient and family together  Visit Type Follow-up;Spiritual support  Referral From Nurse  Consult/Referral To Chaplain  Spiritual Encounters  Spiritual Needs Emotional  Stress Factors  Patient Stress Factors Loss;Major life changes  Amro Winebarger, Barbette Hair, Chaplain 06/14/2014 2:23 PM

## 2014-06-14 NOTE — Progress Notes (Signed)
Patient arrived back from biopsy. Patient complains of "7/10" pain. No signs of distress. VSS, bandaid dressing over biopsy site intact, scant amount of drainage. Per MD order, patient is on bedrest. Patient educated. Bed alarm on. Will continue to monitor.

## 2014-06-14 NOTE — Progress Notes (Signed)
Patient ID: Destiny [name], female   DOB: 1970/07/31, 44 y.o.   MRN: [id number]  [facility] PROGRESS NOTE  Destiny [name] DOB: 08-26-1970 DOA: 06/10/2014 PCP: Barrie Lyme, FNP   Brief narrative:    44 y/o ? with thyroid nodule s/p nodule removal 2010 - TSH 0.3 on admission, SVT s/p catheter ablation for long RP tachycardia, admitted to [facility] 4/2 with main concerns of intractable headaches and per CT head noted to have brain lesion, ? GM vs herpes encephalopathy.  CT abdomen pelvis, chest = 19 mm, 16 mm spiculated nodules right middle lobe. Plan for CT-guided biopsy 06/14/14, oncologist Dr. Burr Medico who will see the patient in consult.  Assessment/Plan:    1. Brain abscess versusmetastatic lung disease - appreciate consultants input-on 4/5 d/c Flagyl, ceftriaxone, vancomycin. Usually would need tissue diagnosis?- SBE with TEE was ruled out 06/12/14. In addition. HIV test is negative. CT chest confirms possible primary lung cancer, unknown type. Appreciate interventional radiology, oncology input - patient was given IV Decadron 20 mg and then by mouth Decadron subsequently. Will need to follow up on biopsy results  2. Paroxysmal SVT with long RP phenomenon - currently in sinus. Monitor. 3. Smoker - nicotine patch given 4. Reflux - give pantoprazole daily 5. DVT prophylaxis - SCD's   Code Status: Full Disposition Plan: inpatient potentially discharge one to 2 days  IV access:  Peripheral IV  Procedures and diagnostic studies:    Dg Orthopantogram  06/12/2014 No significant dental disease.   Dg Chest 1 View  06/14/2014  No pneumothorax visualized after right upper lobe lung lesion biopsy.     Ct Head Wo Contrast 06/10/2014   Abnormal low-density within the left temporal lobe with a configuration that could indicate acute to subacute stroke given the provided clinical symptom, although underlying mass lesion such as glioblastoma multiforme or possibly herpes encephalopathy can  appear similar.   Ct Chest W Contrast  06/12/2014  Two adjacent right middle lobe nodules measuring 19 and 16 mm maximal diameter. Mild spiculation. Appearances worrisome for malignancy. No evidence of significant lymphadenopathy in the chest. No evidence of metastatic disease in the abdomen or pelvis. Nodular enlargement of the uterus suggests fibroids.    Mr Jeri Cos Wo Contrast   06/10/2014 14 mm in diameter ring enhancing lesion at the inferior temporal lobe on the left with pronounced regional vasogenic edema. The imaging findings are most consistent with brain abscess. No restricted diffusion however. The differential diagnosis is that of necrotic neoplasm, but that is felt less likely.     Ct Abdomen Pelvis W Contrast  06/12/2014   Two adjacent right middle lobe nodules measuring 19 and 16 mm maximal diameter. Mild spiculation. Appearances worrisome for malignancy. No evidence of significant lymphadenopathy in the chest. No evidence of metastatic disease in the abdomen or pelvis. Nodular enlargement of the uterus suggests fibroids.   Ct Biopsy  06/14/2014   CT-guided core biopsy of right upper lobe lung lesion. A tiny adjacent right pneumothorax was present following the biopsy. This required no immediate treatment and will be followed by chest x-ray.     Medical Consultants:  Neurosurgery IR Oncology ID  Other Consultants:  None  IAnti-Infectives:   None   Faye Ramsay, MD  [facility] Pager [phone]  If 7PM-7AM, please contact night-coverage [email or web address] Password [facility] 06/14/2014, 5:56 PM   LOS: 4 days   HPI/Subjective: No events overnight.   Objective: Filed Vitals:   06/14/14 1236 06/14/14 1300 06/14/14 1438  06/14/14 1640  BP: 133/79 126/70 136/72 117/68  Pulse: 87 88 90 84  Temp: 97.5 F (36.4 C) 97.6 F (36.4 C) 98.6 F (37 C) 98.6 F (37 C)  TempSrc: Axillary Oral Oral Oral  Resp: 15 15 20 20   Height:      Weight:      SpO2: 100% 100% 100% 100%   No intake or  output data in the 24 hours ending 06/14/14 1756  Exam:  Could not do exam, pt took a phone call and asked me to leave, will call me when ready for me.   Data Reviewed: Basic Metabolic Panel:  Recent Labs Lab 06/10/14 1858 06/11/14 0922  NA 137 135  K 3.9 3.7  CL 106 107  CO2 24 20  GLUCOSE 106* 121*  BUN 6 <5*  CREATININE 0.55 0.57  CALCIUM 8.6 8.8   Liver Function Tests:  Recent Labs Lab 06/10/14 2313 06/11/14 0922  AST 23 21  ALT 19 17  ALKPHOS 70 60  BILITOT 0.2* 0.7  PROT 6.6 6.5  ALBUMIN 3.4* 3.3*   CBC:  Recent Labs Lab 06/10/14 1858 06/11/14 0922 06/14/14 0430  WBC 8.3 13.6* 12.9*  NEUTROABS 7.3 11.2*  --   HGB 12.9 12.3 12.6  HCT 38.5 36.2 36.7  MCV 90.0 87.9 88.0  PLT 257 273 275   Recent Results (from the past 240 hour(s))  Blood culture (routine x 2)     Status: None (Preliminary result)   Collection Time: 06/10/14 10:13 PM  Result Value Ref Range Status   Specimen Description BLOOD LEFT ANTECUBITAL  Final   Special Requests BOTTLES DRAWN AEROBIC AND ANAEROBIC 5CC EA  Final   Culture   Final           BLOOD CULTURE RECEIVED NO GROWTH TO DATE CULTURE WILL BE HELD FOR 5 DAYS BEFORE ISSUING A FINAL NEGATIVE REPORT Performed at [facility]    Report Status PENDING  Incomplete  Blood culture (routine x 2)     Status: None (Preliminary result)   Collection Time: 06/10/14 10:17 PM  Result Value Ref Range Status   Specimen Description BLOOD LEFT HAND  Final   Special Requests BOTTLES DRAWN AEROBIC AND ANAEROBIC 5CC EA  Final   Culture   Final   Report Status PENDING  Incomplete  MRSA PCR Screening     Status: None   Collection Time: 06/11/14  2:16 AM  Result Value Ref Range Status   MRSA by PCR NEGATIVE NEGATIVE Final     Scheduled Meds: . dexamethasone  4 mg Oral 4 times per day  . fentaNYL      . lidocaine      . midazolam      . nicotine  21 mg Transdermal Daily  . pantoprazole  40 mg Oral Daily  . sodium chloride  3 mL  Intravenous Q12H   Continuous Infusions: . sodium chloride 75 mL/hr at 06/14/14 0059

## 2014-06-14 NOTE — Procedures (Signed)
Procedure:  CT guided core biopsy of RUL lung lesion Findings:  2 cm RUL lesion targeted.  18 G core biopsy x 2 via 17 G needle.  Biosentry device used. Complications:  Tiny right PTX EBL<10 mL Plan:  Bedrest x 3 hours.  CXR this afternoon.

## 2014-06-15 DIAGNOSIS — C801 Malignant (primary) neoplasm, unspecified: Secondary | ICD-10-CM

## 2014-06-15 MED ORDER — DEXAMETHASONE 4 MG PO TABS: 4.0000 mg | ORAL_TABLET | Freq: Four times a day (QID) | ORAL | Status: DC

## 2014-06-15 MED ORDER — TRAMADOL HCL 50 MG PO TABS: 50.0000 mg | ORAL_TABLET | Freq: Four times a day (QID) | ORAL | Status: DC | PRN

## 2014-06-15 MED ORDER — ALPRAZOLAM 0.5 MG PO TABS
0.5000 mg | ORAL_TABLET | Freq: Every day | ORAL | Status: DC | PRN
  Administered 2014-06-15: 0.5 mg via ORAL
  Filled 2014-06-15: qty 1

## 2014-06-15 NOTE — Discharge Instructions (Signed)
Lung Biopsy A lung biopsy is a procedure in which a tissue sample is removed from the lung. The tissue can be examined under a microscope to help diagnose various lung disorders.  LET [facility] CARE PROVIDER KNOW ABOUT:  Any allergies you have.  All medicines you are taking, including vitamins, herbs, eye drops, creams, and over-the-counter medicines.  Previous problems you or members of your family have had with the use of anesthetics.  Any blood disorders or bleeding problems that you have.  Previous surgeries you have had.  Medical conditions you have. RISKS AND COMPLICATIONS Generally, a lung biopsy is a safe procedure. However, problems can occur and include:  Collapse of the lung.   Bleeding.   Infection.  BEFORE THE PROCEDURE  Do not eat or drink anything after midnight on the night before the procedure or as directed by your health care provider.  Ask your health care provider about changing or stopping your regular medicines. This is especially important if you are taking diabetes medicines or blood thinners.  Plan to have someone take you home after the procedure. PROCEDURE Various methods can be used to perform a lung biopsy:   Needle biopsy. A biopsy needle is inserted into the lung. The needle is used to collect the tissue sample. A CT scanner may be used to guide the needle to the right place in the lung. For this method, a medicine is used to numb the area where the biopsy sample will be taken (local anesthetic).  Bronchoscopy. A flexible tube (bronchoscope) is inserted into your lungs by going through your mouth or nose. A needle or forceps is passed through the bronchoscope to remove the tissue sample. For this method, medicine may be used to numb the back of your throat.  Open biopsy. A cut (incision) is made in your chest. The tissue sample is then removed using surgical tools. The incision is closed with skin glue, skin adhesive strips, or stitches. For  this method, you will be given medicine to make you sleep through the procedure (general anesthetic). AFTER THE PROCEDURE  Your recovery will be assessed and monitored.  You might have soreness and tenderness at the site of the biopsy for a few days after the procedure.  You might have a cough and some soreness in your throat for a few days if a bronchoscope was used. Document Released: 05/15/2004 Document Revised: 07/11/2013 Document Reviewed: 08/08/2012 [facility] Patient Information 2015 [place], [place]. This information is not intended to replace advice given to you by your health care provider. Make sure you discuss any questions you have with your health care provider.  Dexamethasone tablets What is this medicine? DEXAMETHASONE (dex a METH a sone) is a corticosteroid. It is commonly used to treat inflammation of the skin, joints, lungs, and other organs. Common conditions treated include asthma, allergies, and arthritis. It is also used for other conditions, such as blood disorders and diseases of the adrenal glands. This medicine may be used for other purposes; ask your health care provider or pharmacist if you have questions. COMMON BRAND NAME(S): Decadron, DexPak Sterling Big, DexPak TaperPak, Zema-Pak What should I tell my health care provider before I take this medicine? They need to know if you have any of these conditions: -Cushing's syndrome -diabetes -glaucoma -heart problems or disease -high blood pressure -infection like herpes, measles, tuberculosis, or chickenpox -kidney disease -liver disease -mental problems -myasthenia gravis -osteoporosis -previous heart attack -seizures -stomach, ulcer or intestine disease including colitis and diverticulitis -thyroid  problem -an unusual or allergic reaction to dexamethasone, corticosteroids, other medicines, lactose, foods, dyes, or preservatives -pregnant or trying to get pregnant -breast-feeding How should I use this  medicine? Take this medicine by mouth with a drink of water. Follow the directions on the prescription label. Take it with food or milk to avoid stomach upset. If you are taking this medicine once a day, take it in the morning. Do not take more medicine than you are told to take. Do not suddenly stop taking your medicine because you may develop a severe reaction. Your doctor will tell you how much medicine to take. If your doctor wants you to stop the medicine, the dose may be slowly lowered over time to avoid any side effects. Talk to your pediatrician regarding the use of this medicine in children. Special care may be needed. Patients over 12 years old may have a stronger reaction and need a smaller dose. Overdosage: If you think you have taken too much of this medicine contact a poison control center or emergency room at once. NOTE: This medicine is only for you. Do not share this medicine with others. What if I miss a dose? If you miss a dose, take it as soon as you can. If it is almost time for your next dose, talk to your doctor or health care professional. You may need to miss a dose or take an extra dose. Do not take double or extra doses without advice. What may interact with this medicine? Do not take this medicine with any of the following medications: -mifepristone, RU-486 -vaccines This medicine may also interact with the following medications: -amphotericin B -antibiotics like clarithromycin, erythromycin, and troleandomycin -aspirin and aspirin-like drugs -barbiturates like phenobarbital -carbamazepine -cholestyramine -cholinesterase inhibitors like donepezil, galantamine, rivastigmine, and tacrine -cyclosporine -digoxin -diuretics -ephedrine -female hormones, like estrogens or progestins and birth control pills -indinavir -isoniazid -ketoconazole -medicines for diabetes -medicines that improve muscle tone or strength for conditions like myasthenia gravis -NSAIDs,  medicines for pain and inflammation, like ibuprofen or naproxen -phenytoin -rifampin -thalidomide -warfarin This list may not describe all possible interactions. Give your health care provider a list of all the medicines, herbs, non-prescription drugs, or dietary supplements you use. Also tell them if you smoke, drink alcohol, or use illegal drugs. Some items may interact with your medicine. What should I watch for while using this medicine? Visit your doctor or health care professional for regular checks on your progress. If you are taking this medicine over a prolonged period, carry an identification card with your name and address, the type and dose of your medicine, and your doctor's name and address. This medicine may increase your risk of getting an infection. Stay away from people who are sick. Tell your doctor or health care professional if you are around anyone with measles or chickenpox. If you are going to have surgery, tell your doctor or health care professional that you have taken this medicine within the last twelve months. Ask your doctor or health care professional about your diet. You may need to lower the amount of salt you eat. The medicine can increase your blood sugar. If you are a diabetic check with your doctor if you need help adjusting the dose of your diabetic medicine. What side effects may I notice from receiving this medicine? Side effects that you should report to your doctor or health care professional as soon as possible: -allergic reactions like skin rash, itching or hives, swelling of the face, lips, or  tongue -changes in vision -fever, sore throat, sneezing, cough, or other signs of infection, wounds that will not heal -increased thirst -mental depression, mood swings, mistaken feelings of self importance or of being mistreated -pain in hips, back, ribs, arms, shoulders, or legs -redness, blistering, peeling or loosening of the skin, including inside the  mouth -trouble passing urine or change in the amount of urine -swelling of feet or lower legs -unusual bleeding or bruising Side effects that usually do not require medical attention (report to your doctor or health care professional if they continue or are bothersome): -headache -nausea, vomiting -skin problems, acne, thin and shiny skin -weight gain This list may not describe all possible side effects. Call your doctor for medical advice about side effects. You may report side effects to FDA at 1-800-FDA-1088. Where should I keep my medicine? Keep out of the reach of children. Store at room temperature between 20 and 25 degrees C (68 and 77 degrees F). Protect from light. Throw away any unused medicine after the expiration date. NOTE: This sheet is a summary. It may not cover all possible information. If you have questions about this medicine, talk to your doctor, pharmacist, or health care provider.  2015, Elsevier/Gold Standard. (2007-06-17 14:02:13)  Tramadol tablets What is this medicine? TRAMADOL (TRA ma dole) is a pain reliever. It is used to treat moderate to severe pain in adults. This medicine may be used for other purposes; ask your health care provider or pharmacist if you have questions. COMMON BRAND NAME(S): Ultram What should I tell my health care provider before I take this medicine? They need to know if you have any of these conditions: -brain tumor -depression -drug abuse or addiction -head injury -if you frequently drink alcohol containing drinks -kidney disease or trouble passing urine -liver disease -lung disease, asthma, or breathing problems -seizures or epilepsy -suicidal thoughts, plans, or attempt; a previous suicide attempt by you or a family member -an unusual or allergic reaction to tramadol, codeine, other medicines, foods, dyes, or preservatives -pregnant or trying to get pregnant -breast-feeding How should I use this medicine? Take this medicine  by mouth with a full glass of water. Follow the directions on the prescription label. If the medicine upsets your stomach, take it with food or milk. Do not take more medicine than you are told to take. Talk to your pediatrician regarding the use of this medicine in children. Special care may be needed. Overdosage: If you think you have taken too much of this medicine contact a poison control center or emergency room at once. NOTE: This medicine is only for you. Do not share this medicine with others. What if I miss a dose? If you miss a dose, take it as soon as you can. If it is almost time for your next dose, take only that dose. Do not take double or extra doses. What may interact with this medicine? Do not take this medicine with any of the following medications: -MAOIs like Carbex, Eldepryl, Marplan, Nardil, and Parnate This medicine may also interact with the following medications: -alcohol or medicines that contain alcohol -antihistamines -benzodiazepines -bupropion -carbamazepine or oxcarbazepine -clozapine -cyclobenzaprine -digoxin -furazolidone -linezolid -medicines for depression, anxiety, or psychotic disturbances -medicines for migraine headache like almotriptan, eletriptan, frovatriptan, naratriptan, rizatriptan, sumatriptan, zolmitriptan -medicines for pain like pentazocine, buprenorphine, butorphanol, meperidine, nalbuphine, and propoxyphene -medicines for sleep -muscle relaxants -naltrexone -phenobarbital -phenothiazines like perphenazine, thioridazine, chlorpromazine, mesoridazine, fluphenazine, prochlorperazine, promazine, and trifluoperazine -procarbazine -warfarin This list may not describe  all possible interactions. Give your health care provider a list of all the medicines, herbs, non-prescription drugs, or dietary supplements you use. Also tell them if you smoke, drink alcohol, or use illegal drugs. Some items may interact with your medicine. What should I watch  for while using this medicine? Tell your doctor or health care professional if your pain does not go away, if it gets worse, or if you have new or a different type of pain. You may develop tolerance to the medicine. Tolerance means that you will need a higher dose of the medicine for pain relief. Tolerance is normal and is expected if you take this medicine for a long time. Do not suddenly stop taking your medicine because you may develop a severe reaction. Your body becomes used to the medicine. This does NOT mean you are addicted. Addiction is a behavior related to getting and using a drug for a non-medical reason. If you have pain, you have a medical reason to take pain medicine. Your doctor will tell you how much medicine to take. If your doctor wants you to stop the medicine, the dose will be slowly lowered over time to avoid any side effects. You may get drowsy or dizzy. Do not drive, use machinery, or do anything that needs mental alertness until you know how this medicine affects you. Do not stand or sit up quickly, especially if you are an older patient. This reduces the risk of dizzy or fainting spells. Alcohol can increase or decrease the effects of this medicine. Avoid alcoholic drinks. You may have constipation. Try to have a bowel movement at least every 2 to 3 days. If you do not have a bowel movement for 3 days, call your doctor or health care professional. Your mouth may get dry. Chewing sugarless gum or sucking hard candy, and drinking plenty of water may help. Contact your doctor if the problem does not go away or is severe. What side effects may I notice from receiving this medicine? Side effects that you should report to your doctor or health care professional as soon as possible: -allergic reactions like skin rash, itching or hives, swelling of the face, lips, or tongue -breathing difficulties, wheezing -confusion -itching -light headedness or fainting spells -redness, blistering,  peeling or loosening of the skin, including inside the mouth -seizures Side effects that usually do not require medical attention (report to your doctor or health care professional if they continue or are bothersome): -constipation -dizziness -drowsiness -headache -nausea, vomiting This list may not describe all possible side effects. Call your doctor for medical advice about side effects. You may report side effects to FDA at 1-800-FDA-1088. Where should I keep my medicine? Keep out of the reach of children. Store at room temperature between 15 and 30 degrees C (59 and 86 degrees F). Keep container tightly closed. Throw away any unused medicine after the expiration date. NOTE: This sheet is a summary. It may not cover all possible information. If you have questions about this medicine, talk to your doctor, pharmacist, or health care provider.  2015, Elsevier/Gold Standard. (2009-11-07 11:55:44)

## 2014-06-15 NOTE — Evaluation (Signed)
Occupational Therapy Evaluation Patient Details Name: Destiny [name] MRN: [id number] DOB: 1971/02/25 Today's Date: 06/15/2014    History of Present Illness Destiny [name] is a 44 y.o. female who presented with weakness, frontal headaches x 2 weeks, weight loss and nausea. 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 CT imaging revealed (2) RML lung nodules.  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    Clinical Impression   Pt currently with no OT deficits at this time.  Coordination and balance are WFLs for all selfcare tasks and mobility.  Pt was able to easily retrieve items from the floor, stand with feet together and eyes closed, step over obstacles without UE support and mobilize in the hallway while performing simultaneous head turns.  No LOB noted with any tasks.  Even though pt had a fall onto her knee earlier in the week, unsure of the cause as her balance is completely WNLs.  No further OT needs.    Follow Up Recommendations  No OT follow up          Precautions / Restrictions Precautions Precautions: None      Mobility Bed Mobility Overal bed mobility: Independent                Transfers Overall transfer level: Independent                    Balance Overall balance assessment: Independent                                          ADL Overall ADL's : Independent                                             Vision Vision Assessment?: No apparent visual deficits   Perception Perception Perception Tested?: Yes   Praxis Praxis Praxis tested?: Not tested    Pertinent Vitals/Pain Pain Assessment: 0-10 Pain Score: 3  Pain Location: headaches Pain Descriptors / Indicators: Aching Pain Intervention(s): Limited activity within patient's tolerance;Monitored during session     Hand Dominance Right   Extremity/Trunk Assessment Upper Extremity Assessment Upper Extremity Assessment:  Overall WFL for tasks assessed   Lower Extremity Assessment Lower Extremity Assessment: Overall WFL for tasks assessed   Cervical / Trunk Assessment Cervical / Trunk Assessment: Normal   Communication Communication Communication: No difficulties   Cognition Arousal/Alertness: Awake/alert Behavior During Therapy: WFL for tasks assessed/performed Overall Cognitive Status: Within Functional Limits for tasks assessed                                Home Living Family/patient expects to be discharged to:: Private residence Living Arrangements: Spouse/significant other;Children (59 yr old son ) Available Help at Discharge: Family Type of Home: Apartment Home Access: Stairs to enter Technical brewer of Steps: 1 Entrance Stairs-Rails: Right Home Layout: Two level Alternate Level Stairs-Number of Steps:  12-14    Bathroom Shower/Tub: Tub/shower unit Shower/tub characteristics: Curtain Biochemist, clinical: Handicapped height Bathroom Accessibility: Yes   Home Equipment: None          Prior Functioning/Environment Level of Independence: Independent  End of Session Nurse Communication: Mobility status  Activity Tolerance: Patient tolerated treatment well Patient left: in chair;with call bell/phone within reach   Time: 6886-4847 OT Time Calculation (min): 19 min Charges:  OT General Charges $OT Visit: 1 Procedure OT Evaluation $Initial OT Evaluation Tier I: 1 Procedure OT Treatments $Self Care/Home Management : 8-22 mins  Merrick Feutz OTR/L 06/15/2014, 3:34 PM

## 2014-06-15 NOTE — Progress Notes (Signed)
Chaplain responded to referral from pt nurse for follow up. Pt desires information from doctor for "next steps." She also wants someone to "test her" for walking independently. Pt is a person who likes as much information and clarity as possible. When reasons behind actions are not clear she gets angry and "stressed." Pt reports that she is trying to have as positive as an attitude as possible, as she believes this will help her in her cancer treatment. Chaplain encouraged pt to ask direct questions to doctor. PT appreciates chaplain support. Chaplain will continue to follow.   06/15/14 1000  Clinical Encounter Type  Visited With Patient and family together  Visit Type Follow-up;Spiritual support  Spiritual Encounters  Spiritual Needs Emotional  Stress Factors  Patient Stress Factors Exhausted  Brandin Dilday, Barbette Hair, Chaplain 06/15/2014 10:16 AM

## 2014-06-15 NOTE — Progress Notes (Signed)
Patient ID: Destiny [name], female   DOB: 17-Jun-1970, 44 y.o.   MRN: [id number] Neuro stable. Lung bx done yesterday

## 2014-06-15 NOTE — Progress Notes (Signed)
Physical Therapy Note  Spoke with occupational therapy after initial evaluation. OT reports patient is functioning at a high level of independence, tolerating dynamic gait challenges without difficulty. No physical therapy is indicated at this time. PT is signing-off. Please re-order if there is any significant change in status. Thank you for this referral.  Destiny [name], [place]

## 2014-06-15 NOTE — Discharge Summary (Addendum)
Physician Discharge Summary  Destiny [name] ZLD:[id number] DOB: [birth year] DOA: 06/10/2014  PCP: Barrie Lyme, FNP  Admit date: 06/10/2014 Discharge date: 06/15/2014  Time spent: 30 minutes  Recommendations for Outpatient Follow-up:  1. Follow up with PCP in one week 2. 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 with thyroid nodule s/p nodule removal 2010 - TSH 0.3 on admission, SVT s/p catheter ablation for long RP tachycardia, admitted to [facility] 4/2 with main concerns of intractable headaches and per CT head noted to have brain lesion, ? GM vs herpes encephalopathy. CT abdomen pelvis, chest = 19 mm, 16 mm spiculated nodules right middle lobe. Plan for CT-guided biopsy 06/14/14, oncologist Dr. Burr Medico consulted and recommendations given.  Hospital Course:   1. Probable but unlikely Brain abscess versus metastatic lung diseasewith brain mets - underwent CT guided biopsy of the lung and  preliminary results show cancer. Oncology and radiation oncology consulted. She was discharged on oral decadron. Spoke with Dr Joya Salm and suggested no neuro surgical intervention at this time.  2. Paroxysmal SVT with long RP phenomenon - currently in sinus. Monitor. 3. Smoker - nicotine patch given 4. Reflux - give pantoprazole daily Procedures:  none  Consultations:  Neuro surgery  oncology  Discharge Exam: Filed Vitals:    06/15/14 1429  BP: 162/78  Pulse: 81  Temp: 98.4 F (36.9 C)  Resp: 20    General: alert afebrile comfortable Cardiovascular: s1s2 Respiratory: ctab  Discharge Instructions   Discharge Instructions    Diet - low sodium heart healthy    Complete by:  As directed      Discharge instructions    Complete by:  As directed   FOLLOW UP  Oncologist and radiation oncology recommended.     Increase activity slowly    Complete by:  As directed           Current Discharge Medication List    START taking these medications   Details  dexamethasone (DECADRON) 4 MG tablet Take 1 tablet (4 mg total) by mouth every 6 (six) hours. Qty: 30 tablet, Refills: 1      CONTINUE these medications which have NOT CHANGED   Details  cycloSPORINE (RESTASIS) 0.05 % ophthalmic emulsion Place 1 drop into both eyes 2 (two) times daily.    lisinopril-hydrochlorothiazide (PRINZIDE,ZESTORETIC) 10-12.5 MG per tablet Take 1 tablet by mouth daily.    Olopatadine HCl 0.7 % SOLN Apply 1 drop to eye daily.    omeprazole (PRILOSEC) 20 MG capsule Take 20 mg by mouth daily as needed (for heartburn).      STOP taking these medications     aspirin EC 81 MG tablet      amoxicillin (AMOXIL) 500 MG capsule        No Known Allergies Follow-up Information    Follow up with Barrie Lyme, FNP. Schedule an appointment as soon  as possible for a visit in 1 week.   Specialty:  Nurse Practitioner   Contact information:   [address] [place] [address] [phone]       Follow up with [facility] In 1 day.   Specialty:  Radiation Oncology   Contact information:   [place] [id number] [place] [phone]      Follow up with Truitt Merle, MD In 1 week.   Specialties:  Hematology, Oncology   Contact information:   [place] [address] [phone]        The results of significant diagnostics  from this hospitalization (including imaging, microbiology, ancillary and laboratory) are listed below for reference.    Significant Diagnostic Studies: Dg Orthopantogram  06/12/2014   CLINICAL DATA:  Brain abscess.  Evaluate for dental disease.  EXAM: ORTHOPANTOGRAM/PANORAMIC  COMPARISON:  None.  FINDINGS: No significant dental disease is demonstrated. No obvious dental caries or periapical abscess.  IMPRESSION: No significant dental disease.   Electronically Signed   By: Marijo Sanes M.D.   On: 06/12/2014 20:00   Dg Chest 1 View  06/14/2014   CLINICAL DATA:  Small right pneumothorax following right upper lobe lung nodule biopsy.  EXAM: CHEST  1 VIEW  COMPARISON:  Imaging during CT-guided lung biopsy earlier today.  FINDINGS: The chest x-ray shows no definite right-sided pneumothorax. Nodular masses are identified in the right upper lobe. No edema, pleural fluid or airspace consolidation. The heart size and mediastinal contours are normal.  IMPRESSION: No pneumothorax visualized after right upper lobe lung lesion biopsy.   Electronically Signed   By: Aletta Edouard M.D.   On: 06/14/2014 15:11   Ct Head Wo Contrast  06/10/2014   CLINICAL DATA:  Headache  EXAM: CT HEAD WITHOUT CONTRAST  TECHNIQUE: Contiguous axial images were obtained from the base of the skull through the vertex without intravenous contrast.  COMPARISON:  None.  FINDINGS: Left temporal lobe hypodensity is identified extending from the white matter to the cortical surface. No adjacent mass effect upon the ventricles is identified. No acute hemorrhage is identified. Ventricles are normal in size. No midline shift. No skull fracture. No soft tissue abnormality. Orbits and paranasal sinuses are intact.  IMPRESSION: Abnormal low-density within the left temporal lobe with a configuration that could indicate acute to subacute stroke given the provided clinical symptom, although underlying mass lesion such as glioblastoma multiforme or possibly  herpes encephalopathy can appear similar. Brain MRI with contrast is recommended.  These results were called by telephone at the time of interpretation on 06/10/2014 at 6:33 pm to Dr. Reather Converse, who verbally acknowledged these results.   Electronically Signed   By: Conchita Paris M.D.   On: 06/10/2014 18:36   Ct Chest W Contrast  06/12/2014   CLINICAL DATA:  Chest pain.  Query cancer.  EXAM: CT CHEST, ABDOMEN, AND PELVIS WITH CONTRAST  TECHNIQUE: Multidetector CT imaging of the chest, abdomen and pelvis was performed following the standard protocol during bolus administration of intravenous contrast.  CONTRAST:  159mL OMNIPAQUE IOHEXOL 300 MG/ML SOLN, 1 OMNIPAQUE IOHEXOL 300 MG/ML SOLN  COMPARISON:  CT abdomen and pelvis 10/17/2005  FINDINGS: CT CHEST FINDINGS  Two adjacent right middle lobe nodules are demonstrated, 1 measuring 19 mm in the other 16 mm in maximal diameter. Mild spiculation of the margins. The lateral most lesion appears to extend to the pleural surface with probable involvement of the  pleura. Appearance is worrisome for malignancy. This could represent primary or metastatic lesion. Mild emphysematous changes in the lungs. No focal airspace disease. No pneumothorax. No pleural effusions.  Normal heart size. Coronary artery calcifications. Normal caliber thoracic aorta. No aortic aneurysm or dissection. Great vessel origins are patent. No significant lymphadenopathy in the chest. Esophagus is decompressed.  CT ABDOMEN AND PELVIS FINDINGS  Focal fatty infiltration adjacent to the falciform ligament of the liver. No other focal liver lesions. The gallbladder, spleen, pancreas, adrenal glands, kidneys, abdominal aorta, inferior vena cava, and retroperitoneal lymph nodes are unremarkable. Stomach, small bowel, and colon are not abnormally distended. Fluid fills the colon. No free air or free fluid in the abdomen.  Pelvis: Uterus appears enlarged and nodular consistent with fibroids. Endometrium is mildly  prominent, nonspecific. Ovaries are not enlarged. Bladder wall is not thickened. No free or loculated pelvic fluid collections. No pelvic mass or lymphadenopathy.  Bones:  Normal alignment of the spine.  No destructive bone lesions.  IMPRESSION: Two adjacent right middle lobe nodules measuring 19 and 16 mm maximal diameter. Mild spiculation. Appearances worrisome for malignancy. No evidence of significant lymphadenopathy in the chest. No evidence of metastatic disease in the abdomen or pelvis. Nodular enlargement of the uterus suggests fibroids.   Electronically Signed   By: Lucienne Capers M.D.   On: 06/12/2014 22:18   Mr Jeri Cos TL Contrast  06/10/2014   CLINICAL DATA:  Headache, weakness and nausea.  EXAM: MRI HEAD WITHOUT AND WITH CONTRAST  TECHNIQUE: Multiplanar, multiecho pulse sequences of the brain and surrounding structures were obtained without and with intravenous contrast.  CONTRAST:  103mL MULTIHANCE GADOBENATE DIMEGLUMINE 529 MG/ML IV SOLN  COMPARISON:  Head CT same day  FINDINGS: There is a 14 mm in diameter ring enhancing lesion at the inferior temporal lobe on the left. Wall thickness is fairly uniform measuring about 2 mm. Internal material shows fluid intensity. Pronounced surrounding vasogenic edema. No restricted diffusion. Remainder the brain is normal. No second lesion. No ischemic infarction. No hemorrhage, hydrocephalus or extra-axial fluid collection. The adjacent temporal bone and mastoid air cells are clear. No inflammatory sinus disease. No pituitary mass. No calvarial abnormality.  IMPRESSION: 14 mm in diameter ring enhancing lesion at the inferior temporal lobe on the left with pronounced regional vasogenic edema. The imaging findings are most consistent with brain abscess. No restricted diffusion however. The differential diagnosis is that of necrotic neoplasm, but that is felt less likely.   Electronically Signed   By: Nelson Chimes M.D.   On: 06/10/2014 21:25   Ct Abdomen Pelvis  W Contrast  06/12/2014   CLINICAL DATA:  Chest pain.  Query cancer.  EXAM: CT CHEST, ABDOMEN, AND PELVIS WITH CONTRAST  TECHNIQUE: Multidetector CT imaging of the chest, abdomen and pelvis was performed following the standard protocol during bolus administration of intravenous contrast.  CONTRAST:  136mL OMNIPAQUE IOHEXOL 300 MG/ML SOLN, 1 OMNIPAQUE IOHEXOL 300 MG/ML SOLN  COMPARISON:  CT abdomen and pelvis 10/17/2005  FINDINGS: CT CHEST FINDINGS  Two adjacent right middle lobe nodules are demonstrated, 1 measuring 19 mm in the other 16 mm in maximal diameter. Mild spiculation of the margins. The lateral most lesion appears to extend to the pleural surface with probable involvement of the pleura. Appearance is worrisome for malignancy. This could represent primary or metastatic lesion. Mild emphysematous changes in the lungs. No focal airspace disease. No pneumothorax. No pleural effusions.  Normal heart size. Coronary artery calcifications. Normal caliber thoracic  aorta. No aortic aneurysm or dissection. Great vessel origins are patent. No significant lymphadenopathy in the chest. Esophagus is decompressed.  CT ABDOMEN AND PELVIS FINDINGS  Focal fatty infiltration adjacent to the falciform ligament of the liver. No other focal liver lesions. The gallbladder, spleen, pancreas, adrenal glands, kidneys, abdominal aorta, inferior vena cava, and retroperitoneal lymph nodes are unremarkable. Stomach, small bowel, and colon are not abnormally distended. Fluid fills the colon. No free air or free fluid in the abdomen.  Pelvis: Uterus appears enlarged and nodular consistent with fibroids. Endometrium is mildly prominent, nonspecific. Ovaries are not enlarged. Bladder wall is not thickened. No free or loculated pelvic fluid collections. No pelvic mass or lymphadenopathy.  Bones:  Normal alignment of the spine.  No destructive bone lesions.  IMPRESSION: Two adjacent right middle lobe nodules measuring 19 and 16 mm maximal  diameter. Mild spiculation. Appearances worrisome for malignancy. No evidence of significant lymphadenopathy in the chest. No evidence of metastatic disease in the abdomen or pelvis. Nodular enlargement of the uterus suggests fibroids.   Electronically Signed   By: Lucienne Capers M.D.   On: 06/12/2014 22:18   Ct Biopsy  06/14/2014   CLINICAL DATA:  Two separate masses in the right upper lobe and left temporal lobe ring-enhancing intracranial lesion.  EXAM: CT GUIDED CORE BIOPSY OF RIGHT UPPER LOBE LUNG MASS  ANESTHESIA/SEDATION: 2.0  Mg IV Versed; 150 mcg IV Fentanyl  Total Moderate Sedation Time: 30 minutes.  PROCEDURE: The procedure risks, benefits, and alternatives were explained to the patient. Questions regarding the procedure were encouraged and answered. The patient understands and consents to the procedure.  The right lateral chest wall was prepped with Betadine in a sterile fashion, and a sterile drape was applied covering the operative field. A sterile gown and sterile gloves were used for the procedure. Local anesthesia was provided with 1% Lidocaine.  CT was performed in a supine position with the right arm elevated above the patient's head. After localizing a site for percutaneous skin entry by CT, the skin was marked.  Utilizing CT fluoroscopic guidance, a 17 gauge needle was advanced from a a lateral approach into the right upper lobe. Coaxial core biopsy was performed with an 18 gauge device. A total of 2 core biopsy samples were obtained and placed in formalin.  The Biosentry device was used to place a plug at the pleural entry site upon retraction of the outer needle. Additional CT imaging was performed.  COMPLICATIONS: Tiny right pneumothorax after biopsy. SIR level A: No therapy, no consequence.  FINDINGS: Two adjacent right upper lobe lung lesions are again demonstrated. The more lateral of these 2 lesions measures approximately 1.9 cm. Solid tissue was obtained. The biopsy was complicated  by a tiny pneumothorax adjacent to the lesion. The patient was asymptomatic following biopsy. This will be followed by chest x-ray.  IMPRESSION: CT-guided core biopsy of right upper lobe lung lesion. A tiny adjacent right pneumothorax was present following the biopsy. This required no immediate treatment and will be followed by chest x-ray.   Electronically Signed   By: Aletta Edouard M.D.   On: 06/14/2014 12:43    Microbiology: Recent Results (from the past 240 hour(s))  Blood culture (routine x 2)     Status: None (Preliminary result)   Collection Time: 06/10/14 10:13 PM  Result Value Ref Range Status   Specimen Description BLOOD LEFT ANTECUBITAL  Final   Special Requests BOTTLES DRAWN AEROBIC AND ANAEROBIC 5CC EA  Final  Culture   Final           BLOOD CULTURE RECEIVED NO GROWTH TO DATE CULTURE WILL BE HELD FOR 5 DAYS BEFORE ISSUING A FINAL NEGATIVE REPORT Performed at [facility]    Report Status PENDING  Incomplete  Blood culture (routine x 2)     Status: None (Preliminary result)   Collection Time: 06/10/14 10:17 PM  Result Value Ref Range Status   Specimen Description BLOOD LEFT HAND  Final   Special Requests BOTTLES DRAWN AEROBIC AND ANAEROBIC 5CC EA  Final   Culture   Final           BLOOD CULTURE RECEIVED NO GROWTH TO DATE CULTURE WILL BE HELD FOR 5 DAYS BEFORE ISSUING A FINAL NEGATIVE REPORT Performed at [facility]    Report Status PENDING  Incomplete  MRSA PCR Screening     Status: None   Collection Time: 06/11/14  2:16 AM  Result Value Ref Range Status   MRSA by PCR NEGATIVE NEGATIVE Final    Comment:        The GeneXpert MRSA Assay (FDA approved for NASAL specimens only), is one component of a comprehensive MRSA colonization surveillance program. It is not intended to diagnose MRSA infection nor to guide or monitor treatment for MRSA infections.      Labs: Basic Metabolic Panel:  Recent Labs Lab 06/10/14 1858 06/11/14 0922  NA 137  135  K 3.9 3.7  CL 106 107  CO2 24 20  GLUCOSE 106* 121*  BUN 6 <5*  CREATININE 0.55 0.57  CALCIUM 8.6 8.8   Liver Function Tests:  Recent Labs Lab 06/10/14 2313 06/11/14 0922  AST 23 21  ALT 19 17  ALKPHOS 70 60  BILITOT 0.2* 0.7  PROT 6.6 6.5  ALBUMIN 3.4* 3.3*   No results for input(s): LIPASE, AMYLASE in the last 168 hours. No results for input(s): AMMONIA in the last 168 hours. CBC:  Recent Labs Lab 06/10/14 1858 06/11/14 0922 06/14/14 0430  WBC 8.3 13.6* 12.9*  NEUTROABS 7.3 11.2*  --   HGB 12.9 12.3 12.6  HCT 38.5 36.2 36.7  MCV 90.0 87.9 88.0  PLT 257 273 275   Cardiac Enzymes: No results for input(s): CKTOTAL, CKMB, CKMBINDEX, TROPONINI in the last 168 hours. BNP: BNP (last 3 results) No results for input(s): BNP in the last 8760 hours.  ProBNP (last 3 results) No results for input(s): PROBNP in the last 8760 hours.  CBG: No results for input(s): GLUCAP in the last 168 hours.     SignedHosie Poisson  [facility] 06/15/2014, 4:57 PM

## 2014-06-15 NOTE — Progress Notes (Signed)
Patient discharged home with son. RN discussed discharge instructions with patient. Per MD, patient should give pharmacy tramadol rx (with MD's pager number) and have pharmacist call MD to verify rx. Patient was given both prescriptions. Patient given phone number to follow up with oncologist. Patient states she understands d/c instructions and prescriptions. Tele removed, IV removed.

## 2014-06-16 ENCOUNTER — Other Ambulatory Visit: Payer: Self-pay | Admitting: Hematology

## 2014-06-16 ENCOUNTER — Telehealth: Payer: Self-pay | Admitting: *Deleted

## 2014-06-16 DIAGNOSIS — C342 Malignant neoplasm of middle lobe, bronchus or lung: Secondary | ICD-10-CM

## 2014-06-16 NOTE — Telephone Encounter (Signed)
Received referral.  Called to set up appt for thoracic clinic.  I left vm message to call with my name and phone number

## 2014-06-17 LAB — CULTURE, BLOOD (ROUTINE X 2)
CULTURE: NO GROWTH
Culture: NO GROWTH

## 2014-06-18 ENCOUNTER — Encounter (HOSPITAL_COMMUNITY): Payer: Self-pay | Admitting: Emergency Medicine

## 2014-06-18 ENCOUNTER — Encounter: Payer: Self-pay | Admitting: Radiation Oncology

## 2014-06-18 ENCOUNTER — Emergency Department (HOSPITAL_COMMUNITY)
Admission: EM | Admit: 2014-06-18 | Discharge: 2014-06-18 | Disposition: A | Discharge status: Home / Self Care | Payer: Medicaid Other | Attending: Emergency Medicine | Admitting: Emergency Medicine

## 2014-06-18 DIAGNOSIS — Z79899 Other long term (current) drug therapy: Secondary | ICD-10-CM | POA: Diagnosis not present

## 2014-06-18 DIAGNOSIS — Z8639 Personal history of other endocrine, nutritional and metabolic disease: Secondary | ICD-10-CM | POA: Insufficient documentation

## 2014-06-18 DIAGNOSIS — Z8679 Personal history of other diseases of the circulatory system: Secondary | ICD-10-CM | POA: Insufficient documentation

## 2014-06-18 DIAGNOSIS — D332 Benign neoplasm of brain, unspecified: Secondary | ICD-10-CM | POA: Insufficient documentation

## 2014-06-18 DIAGNOSIS — Z720 Tobacco use: Secondary | ICD-10-CM | POA: Diagnosis not present

## 2014-06-18 DIAGNOSIS — R51 Headache: Secondary | ICD-10-CM | POA: Diagnosis present

## 2014-06-18 DIAGNOSIS — C7931 Secondary malignant neoplasm of brain: Secondary | ICD-10-CM | POA: Insufficient documentation

## 2014-06-18 DIAGNOSIS — F329 Major depressive disorder, single episode, unspecified: Secondary | ICD-10-CM | POA: Diagnosis not present

## 2014-06-18 DIAGNOSIS — D496 Neoplasm of unspecified behavior of brain: Secondary | ICD-10-CM

## 2014-06-18 MED ORDER — HYDROCODONE-ACETAMINOPHEN 5-325 MG PO TABS: 2.0000 | ORAL_TABLET | ORAL | Status: DC | PRN

## 2014-06-18 MED ORDER — NICOTINE 21 MG/24HR TD PT24: 21.0000 mg | MEDICATED_PATCH | Freq: Every day | TRANSDERMAL | Status: DC

## 2014-06-18 MED ORDER — DOCUSATE SODIUM 250 MG PO CAPS: 250.0000 mg | ORAL_CAPSULE | Freq: Every day | ORAL | Status: DC

## 2014-06-18 MED ORDER — HYDROCODONE-ACETAMINOPHEN 5-325 MG PO TABS
2.0000 | ORAL_TABLET | Freq: Once | ORAL | Status: AC
  Administered 2014-06-18: 2 via ORAL
  Filled 2014-06-18: qty 2

## 2014-06-18 MED ORDER — ONDANSETRON 4 MG PO TBDP
4.0000 mg | ORAL_TABLET | Freq: Once | ORAL | Status: AC
  Administered 2014-06-18: 4 mg via ORAL
  Filled 2014-06-18: qty 1

## 2014-06-18 NOTE — ED Notes (Signed)
Pt. Stated, I ve been dx with brain cancer in the front part of my head. Im out of my medication

## 2014-06-18 NOTE — ED Provider Notes (Signed)
CSN: [id number]     Arrival date & time 06/18/14  31 History   First MD Initiated Contact with Patient 06/18/14 1057     Chief Complaint  Patient presents with  . Headache      HPI  She presents right away should of a headache. Diagnosed last week with brain mass. MRI showed a solitary tumor with vasogenic edema with differential of primary brain tumor, metastatic tumor, versus abscess. Is on antibiotics but had negative study for infection with negative echo and negative cultures. Has oncology appointment tomorrow. Had lung biopsy showing adenocarcinoma. Was discharged on Decadron which she is taking. Also given Ultram which she is taking. States she gets relief for maybe an hour but has quite a bit of breakthrough pain is uncomfortable and not sleeping. No vision changes or difficulty with speech cognition swallowing. No peripheral numbness weakness or other symptoms.  Past Medical History  Diagnosis Date  . Thyroid nodule   . Depression   . Suicide attempt   . SVT (supraventricular tachycardia)     a. Long RP tachycardia;  b. 01/2014 s/p RFCA.   Past Surgical History  Procedure Laterality Date  . Thyroid surgery      Removed thyroid nodule, states still has thyroid; 2010  . Supraventricular tachycardia ablation N/A 01/16/2014    Procedure: SUPRAVENTRICULAR TACHYCARDIA ABLATION;  Surgeon: Evans Lance, MD;  Location: [facility] CATH LAB;  Service: Cardiovascular;  Laterality: N/A;  . Tee without cardioversion N/A 06/12/2014    Procedure: TRANSESOPHAGEAL ECHOCARDIOGRAM (TEE);  Surgeon: Sueanne Margarita, MD;  Location: [facility] ENDOSCOPY;  Service: Cardiovascular;  Laterality: N/A;   Family History  Problem Relation Age of Onset  . Seizures Mother   . Colon cancer Father   . Hypertension Father    History  Substance Use Topics  . Smoking status: Current Every Day Smoker -- 1.00 packs/day  . Smokeless tobacco: Not on file  . Alcohol Use: Yes     Comment: occ   OB History    No data  available     Review of Systems  Constitutional: Negative for fever, chills, diaphoresis, appetite change and fatigue.  HENT: Negative for mouth sores, sore throat and trouble swallowing.   Eyes: Negative for visual disturbance.  Respiratory: Negative for cough, chest tightness, shortness of breath and wheezing.   Cardiovascular: Negative for chest pain.  Gastrointestinal: Negative for nausea, vomiting, abdominal pain, diarrhea and abdominal distention.  Endocrine: Negative for polydipsia, polyphagia and polyuria.  Genitourinary: Negative for dysuria, frequency and hematuria.  Musculoskeletal: Negative for gait problem.  Skin: Negative for color change, pallor and rash.  Neurological: Negative for dizziness, syncope, light-headedness and headaches.  Hematological: Does not bruise/bleed easily.  Psychiatric/Behavioral: Negative for behavioral problems and confusion.      Allergies  Review of patient's allergies indicates no known allergies.  Home Medications   Prior to Admission medications   Medication Sig Start Date End Date Taking? Authorizing Provider  cycloSPORINE (RESTASIS) 0.05 % ophthalmic emulsion Place 1 drop into both eyes 2 (two) times daily.    Historical Provider, MD  dexamethasone (DECADRON) 4 MG tablet Take 1 tablet (4 mg total) by mouth every 6 (six) hours. 06/15/14   Hosie Poisson, MD  docusate sodium (COLACE) 250 MG capsule Take 1 capsule (250 mg total) by mouth daily. 06/18/14   Tanna Furry, MD  HYDROcodone-acetaminophen (NORCO/VICODIN) 5-325 MG per tablet Take 2 tablets by mouth every 4 (four) hours as needed. 06/18/14   Tanna Furry, MD  lisinopril-hydrochlorothiazide (PRINZIDE,ZESTORETIC) 10-12.5 MG per tablet Take 1 tablet by mouth daily.    Historical Provider, MD  nicotine (NICODERM CQ) 21 mg/24hr patch Place 1 patch (21 mg total) onto the skin daily. 06/18/14   Tanna Furry, MD  Olopatadine HCl 0.7 % SOLN Apply 1 drop to eye daily.    Historical Provider, MD   omeprazole (PRILOSEC) 20 MG capsule Take 20 mg by mouth daily as needed (for heartburn).    Historical Provider, MD  traMADol (ULTRAM) 50 MG tablet Take 1 tablet (50 mg total) by mouth every 6 (six) hours as needed. 06/15/14   Hosie Poisson, MD   BP 110/66 mmHg  Pulse 72  Temp(Src) 97.8 F (36.6 C) (Oral)  Resp 18  Ht 5\' 7"  (1.702 m)  Wt 164 lb (74.39 kg)  BMI 25.68 kg/m2  SpO2 97%  LMP 06/04/2014 Physical Exam  Constitutional: She is oriented to person, place, and time. She appears well-developed and well-nourished. No distress.  HENT:  Head: Normocephalic.  Eyes: Conjunctivae are normal. Pupils are equal, round, and reactive to light. No scleral icterus.  Neck: Normal range of motion. Neck supple. No thyromegaly present.  Cardiovascular: Normal rate and regular rhythm.  Exam reveals no gallop and no friction rub.   No murmur heard. Pulmonary/Chest: Effort normal and breath sounds normal. No respiratory distress. She has no wheezes. She has no rales.  Abdominal: Soft. Bowel sounds are normal. She exhibits no distension. There is no tenderness. There is no rebound.  Musculoskeletal: Normal range of motion.  Neurological: She is alert and oriented to person, place, and time.  Normal symmetric Strength to shoulder shrug, triceps, biceps, grip,wrist flex/extend,and intrinsics  Norma lsymmetric sensation above and below clavicles, and to all distributions to UEs. Norma symmetric strength to flex/.extend hip and knees, dorsi/plantar flex ankles. Normal symmetric sensation to all distributions to LEs Patellar and achilles reflexes 1-2+. Downgoing Babinski   Skin: Skin is warm and dry. No rash noted.  Psychiatric: She has a normal mood and affect. Her behavior is normal.    ED Course  Procedures (including critical care time) Labs Review Labs Reviewed - No data to display  Imaging Review No results found.   EKG Interpretation None      MDM   Final diagnoses:  Brain tumor     No indication for studies at this time. She has persistence of her pain. She has no reported worsening symptoms. She's has a normal exam. Normal cranial nerves. Normal peripheral neurological exam. Normal cognition and gait. Given her prescription for Decadron at her request that she states she did well with her withdrawal symptoms in the hospital. She has an appointment with her oncologist tomorrow to discuss initiation of treatment. Prescription for Vicodin, and Colace. I did discuss with her the results of her lung biopsy. She was aware that this represents adenocarcinoma. States she fully intends to follow-up with her oncologist tomorrow.    Tanna Furry, MD 06/18/14 1139

## 2014-06-18 NOTE — Discharge Instructions (Signed)
Follow up with your Oncology Referral Tomorrow.

## 2014-06-19 ENCOUNTER — Encounter: Payer: Self-pay | Admitting: Radiation Oncology

## 2014-06-19 ENCOUNTER — Telehealth: Payer: Self-pay | Admitting: Hematology

## 2014-06-19 ENCOUNTER — Telehealth: Payer: Self-pay | Admitting: *Deleted

## 2014-06-19 ENCOUNTER — Telehealth: Payer: Self-pay | Admitting: Radiation Oncology

## 2014-06-19 ENCOUNTER — Encounter: Payer: Self-pay | Admitting: *Deleted

## 2014-06-19 ENCOUNTER — Other Ambulatory Visit: Payer: Self-pay | Admitting: Radiation Therapy

## 2014-06-19 DIAGNOSIS — C7931 Secondary malignant neoplasm of brain: Secondary | ICD-10-CM

## 2014-06-19 NOTE — Telephone Encounter (Signed)
Called patient to set up for thoracic clinic on 06/22/14 arrive at 3:30.  I spoke to son per patient's request and gave him appt time and place.

## 2014-06-19 NOTE — Progress Notes (Signed)
Per Dr. Johny Shears request, I called radiology to get PET scan this week.  Scheduler's state the earliest is 06/28/14 arrive at 7:45.  I will email radiology manager to see if this can be scheduled earlier.    Per request of Dr. Burr Medico, I called pathology to see when tissue will be sent to [facility] and if there is enough tissue.  Tammy in pathology dept is unaware of Medicaid insurance and if there is a wait period to be sent.  I will follow up with [facility] rep.

## 2014-06-19 NOTE — Telephone Encounter (Signed)
new patient appt-s/w patient and gave np appt for 04/21 @ 10:30 w/Dr. Burr Medico.

## 2014-06-19 NOTE — Telephone Encounter (Signed)
Patient left message on voicemail asking to be called. Patient not established at our facility. Found 4/11 documentation from Hinton Dyer re a pet scan appointment and 4/8 documentation from Hinton Dyer re trying to contact patient. Left message for Hinton Dyer today informing her that patient called and asking that she return patients call.

## 2014-06-19 NOTE — Telephone Encounter (Signed)
Called patient to clarify appt and give her pre-procedure instructions for PET scan.

## 2014-06-19 NOTE — Progress Notes (Signed)
Rec'd NPI for [place], [place] ([id number]) and auth from [place] [id number] 06/19/2014-07/19/2014) for tomorrow's MRI [id number].

## 2014-06-19 NOTE — Telephone Encounter (Signed)
Per Dr. Johny Shears order called in ativan to [facility] at [facility] in [place]. Phoned patient's home. Patient feeling overwhelmed requested this RN speak with her step son, [name]. Explained indication and directions for use of Ativan. Also, informed him it would be ready for pick up at his pharmacy within the hour. [name] verbalized understanding.

## 2014-06-20 ENCOUNTER — Ambulatory Visit
Admission: RE | Admit: 2014-06-20 | Discharge: 2014-06-20 | Disposition: A | Discharge status: Home / Self Care | Payer: Medicaid Other | Source: Ambulatory Visit | Attending: Radiation Oncology | Admitting: Radiation Oncology

## 2014-06-20 ENCOUNTER — Encounter (HOSPITAL_COMMUNITY): Payer: Medicaid Other

## 2014-06-20 DIAGNOSIS — C7931 Secondary malignant neoplasm of brain: Secondary | ICD-10-CM

## 2014-06-20 MED ORDER — GADOBENATE DIMEGLUMINE 529 MG/ML IV SOLN: 15.0000 mL | Freq: Once | INTRAVENOUS | Status: AC | PRN

## 2014-06-21 ENCOUNTER — Telehealth: Payer: Self-pay | Admitting: *Deleted

## 2014-06-21 NOTE — Telephone Encounter (Signed)
Called and left a message for pt reminding her of clinic appt tomorrow.

## 2014-06-22 ENCOUNTER — Other Ambulatory Visit: Payer: Self-pay | Admitting: Radiation Oncology

## 2014-06-22 ENCOUNTER — Ambulatory Visit: Payer: Medicaid Other | Attending: Radiation Oncology | Admitting: Physical Therapy

## 2014-06-22 ENCOUNTER — Ambulatory Visit: Payer: Medicaid Other | Admitting: Radiation Oncology

## 2014-06-22 ENCOUNTER — Ambulatory Visit
Admission: RE | Admit: 2014-06-22 | Discharge: 2014-06-22 | Disposition: A | Discharge status: Home / Self Care | Payer: Medicaid Other | Source: Ambulatory Visit | Attending: Radiation Oncology | Admitting: Radiation Oncology

## 2014-06-22 DIAGNOSIS — M545 Low back pain, unspecified: Secondary | ICD-10-CM

## 2014-06-22 DIAGNOSIS — M79604 Pain in right leg: Secondary | ICD-10-CM | POA: Insufficient documentation

## 2014-06-22 DIAGNOSIS — M79605 Pain in left leg: Secondary | ICD-10-CM

## 2014-06-22 DIAGNOSIS — D381 Neoplasm of uncertain behavior of trachea, bronchus and lung: Secondary | ICD-10-CM

## 2014-06-22 DIAGNOSIS — R5381 Other malaise: Secondary | ICD-10-CM

## 2014-06-22 DIAGNOSIS — C7931 Secondary malignant neoplasm of brain: Secondary | ICD-10-CM

## 2014-06-22 MED ORDER — DEXAMETHASONE 4 MG PO TABS: 4.0000 mg | ORAL_TABLET | ORAL | Status: DC

## 2014-06-22 NOTE — Progress Notes (Signed)
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    ICD-9-CM ICD-10-CM   1. Solitary brain metastasis 198.3 C79.31     HISTORY OF PRESENT ILLNESS::Destiny [name] is a 44 y.o. female  On 4/1 presented with headaches. On 4/2 she had a brain CT that showed abnornal area in left temporal lobe which was less dense that the rest of her brain. 4/2 MRI  showed a ring enhancing 14 mm solitary lesion in left temporal lobe. 4/4 chest CT scan showed two lesions in right upper lung. On 4/6 CT guided biopsy revealed adenocarcinoma. She was started on dexamethazone 4 mg q 6 hours. Continues to have headaches. 4/12 had targeting brain MRI which confirmed solitary 14 mm metastasis in left temporal lobe  PREVIOUS RADIATION THERAPY: No  PAST MEDICAL HISTORY:  has a past medical history of Thyroid nodule; Depression; Suicide attempt; and SVT (supraventricular tachycardia).    PAST SURGICAL HISTORY: Past Surgical History  Procedure Laterality Date  . Thyroid surgery      Removed thyroid nodule, states still has thyroid; 2010  . Supraventricular tachycardia ablation N/A 01/16/2014    Procedure: SUPRAVENTRICULAR TACHYCARDIA ABLATION;  Surgeon: Evans Lance, MD;  Location: [facility] CATH LAB;  Service: Cardiovascular;  Laterality: N/A;  . Tee without cardioversion N/A 06/12/2014    Procedure: TRANSESOPHAGEAL ECHOCARDIOGRAM (TEE);  Surgeon: Sueanne Margarita, MD;  Location: [facility] ENDOSCOPY;  Service: Cardiovascular;  Laterality: N/A;    FAMILY HISTORY: family history includes Colon cancer in her father; Hypertension in her father; Seizures in her mother.  SOCIAL HISTORY:  reports  that she has been smoking.  She does not have any smokeless tobacco history on file. She reports that she drinks alcohol. She reports that she does not use illicit drugs.  ALLERGIES: Review of patient's allergies indicates no known allergies.  MEDICATIONS:  Current Outpatient Prescriptions  Medication Sig Dispense Refill  . cycloSPORINE (RESTASIS) 0.05 % ophthalmic emulsion Place 1 drop into both eyes 2 (two) times daily.    [name] dexamethasone (DECADRON) 4 MG tablet Take 1 tablet (4 mg total) by mouth every 6 (six) hours. 30 tablet 1  . docusate sodium (COLACE) 250 MG capsule Take 1 capsule (250 mg total) by mouth daily. 30 capsule 0  . HYDROcodone-acetaminophen (NORCO/VICODIN) 5-325 MG per tablet Take 2 tablets by mouth every 4 (four) hours as needed. 24 tablet 0  . lisinopril-hydrochlorothiazide (PRINZIDE,ZESTORETIC) 10-12.5 MG per tablet Take 1 tablet by mouth daily.    . nicotine (NICODERM CQ) 21 mg/24hr patch Place 1 patch (21 mg total) onto the skin daily. 28 patch 0  . Olopatadine HCl 0.7 % SOLN Apply 1 drop to eye daily.    [name] omeprazole (PRILOSEC) 20 MG capsule Take 20 mg by mouth daily as needed (for heartburn).    . traMADol (ULTRAM) 50 MG tablet Take 1 tablet (50 mg total) by mouth every 6 (six) hours as needed. 15 tablet 0   No current facility-administered medications for this encounter.    REVIEW OF SYSTEMS:  A 15 point review of systems is documented in the electronic medical record. This was obtained by the nursing staff. However, I reviewed this with the patient  to discuss relevant findings and make appropriate changes.  Pertinent items are noted in HPI.   PHYSICAL EXAM: per Dr. Burr Medico  GENERAL:alert, no distress and comfortable SKIN: skin color, texture, turgor are normal, no rashes or significant lesions EYES: normal, conjunctiva are pink and non-injected, sclera clear OROPHARYNX:no exudate, no erythema and lips, buccal mucosa, and tongue normal  NECK: supple, thyroid normal  size, non-tender, without nodularity LYMPH: no palpable lymphadenopathy in the cervical, axillary or inguinal LUNGS: clear to auscultation and percussion with normal breathing effort HEART: regular rate & rhythm and no murmurs and no lower extremity edema ABDOMEN:abdomen soft, non-tender and normal bowel sounds Musculoskeletal:no cyanosis of digits and no clubbing  PSYCH: alert & oriented x 3 with fluent speech NEURO: no focal motor/sensory deficits    KPS = 100  100 - Normal; no complaints; no evidence of disease. 90   - Able to carry on normal activity; minor signs or symptoms of disease. 80   - Normal activity with effort; some signs or symptoms of disease. 50   - Cares for self; unable to carry on normal activity or to do active work. 60   - Requires occasional assistance, but is able to care for most of his personal needs. 50   - Requires considerable assistance and frequent medical care. 96   - Disabled; requires special care and assistance. 9   - Severely disabled; hospital admission is indicated although death not imminent. 45   - Very sick; hospital admission necessary; active supportive treatment necessary. 10   - Moribund; fatal processes progressing rapidly. 0     - Dead  Karnofsky DA, Abelmann Fife, Craver LS and Burchenal JH [phone]) The use of the nitrogen mustards in the palliative treatment of carcinoma: with particular reference to bronchogenic carcinoma Cancer 1 634-56  LABORATORY DATA:  Lab Results  Component Value Date   WBC 12.9* 06/14/2014   HGB 12.6 06/14/2014   HCT 36.7 06/14/2014   MCV 88.0 06/14/2014   PLT 275 06/14/2014   Lab Results  Component Value Date   NA 135 06/11/2014   K 3.7 06/11/2014   CL 107 06/11/2014   CO2 20 06/11/2014   Lab Results  Component Value Date   ALT 17 06/11/2014   AST 21 06/11/2014   ALKPHOS 60 06/11/2014   BILITOT 0.7 06/11/2014    PULMONARY FUNCTION TEST:  N/A   RADIOGRAPHY: Dg Orthopantogram  06/12/2014    CLINICAL DATA:  Brain abscess.  Evaluate for dental disease.  EXAM: ORTHOPANTOGRAM/PANORAMIC  COMPARISON:  None.  FINDINGS: No significant dental disease is demonstrated. No obvious dental caries or periapical abscess.  IMPRESSION: No significant dental disease.   Electronically Signed   By: Marijo Sanes M.D.   On: 06/12/2014 20:00   Dg Chest 1 View  06/14/2014   CLINICAL DATA:  Small right pneumothorax following right upper lobe lung nodule biopsy.  EXAM: CHEST  1 VIEW  COMPARISON:  Imaging during CT-guided lung biopsy earlier today.  FINDINGS: The chest x-ray shows no definite right-sided pneumothorax. Nodular masses are identified in the right upper lobe. No edema, pleural fluid or airspace consolidation. The heart size and mediastinal contours are normal.  IMPRESSION: No pneumothorax visualized after right upper lobe lung lesion biopsy.   Electronically Signed   By: Aletta Edouard M.D.   On: 06/14/2014 15:11   Ct Head Wo Contrast  06/10/2014   CLINICAL DATA:  Headache  EXAM: CT HEAD WITHOUT CONTRAST  TECHNIQUE: Contiguous axial images were  obtained from the base of the skull through the vertex without intravenous contrast.  COMPARISON:  None.  FINDINGS: Left temporal lobe hypodensity is identified extending from the white matter to the cortical surface. No adjacent mass effect upon the ventricles is identified. No acute hemorrhage is identified. Ventricles are normal in size. No midline shift. No skull fracture. No soft tissue abnormality. Orbits and paranasal sinuses are intact.  IMPRESSION: Abnormal low-density within the left temporal lobe with a configuration that could indicate acute to subacute stroke given the provided clinical symptom, although underlying mass lesion such as glioblastoma multiforme or possibly herpes encephalopathy can appear similar. Brain MRI with contrast is recommended.  These results were called by telephone at the time of interpretation on 06/10/2014 at 6:33 pm to Dr. Reather Converse,  who verbally acknowledged these results.   Electronically Signed   By: Conchita Paris M.D.   On: 06/10/2014 18:36   Ct Chest W Contrast  06/12/2014   CLINICAL DATA:  Chest pain.  Query cancer.  EXAM: CT CHEST, ABDOMEN, AND PELVIS WITH CONTRAST  TECHNIQUE: Multidetector CT imaging of the chest, abdomen and pelvis was performed following the standard protocol during bolus administration of intravenous contrast.  CONTRAST:  148mL OMNIPAQUE IOHEXOL 300 MG/ML SOLN, 1 OMNIPAQUE IOHEXOL 300 MG/ML SOLN  COMPARISON:  CT abdomen and pelvis 10/17/2005  FINDINGS: CT CHEST FINDINGS  Two adjacent right middle lobe nodules are demonstrated, 1 measuring 19 mm in the other 16 mm in maximal diameter. Mild spiculation of the margins. The lateral most lesion appears to extend to the pleural surface with probable involvement of the pleura. Appearance is worrisome for malignancy. This could represent primary or metastatic lesion. Mild emphysematous changes in the lungs. No focal airspace disease. No pneumothorax. No pleural effusions.  Normal heart size. Coronary artery calcifications. Normal caliber thoracic aorta. No aortic aneurysm or dissection. Great vessel origins are patent. No significant lymphadenopathy in the chest. Esophagus is decompressed.  CT ABDOMEN AND PELVIS FINDINGS  Focal fatty infiltration adjacent to the falciform ligament of the liver. No other focal liver lesions. The gallbladder, spleen, pancreas, adrenal glands, kidneys, abdominal aorta, inferior vena cava, and retroperitoneal lymph nodes are unremarkable. Stomach, small bowel, and colon are not abnormally distended. Fluid fills the colon. No free air or free fluid in the abdomen.  Pelvis: Uterus appears enlarged and nodular consistent with fibroids. Endometrium is mildly prominent, nonspecific. Ovaries are not enlarged. Bladder wall is not thickened. No free or loculated pelvic fluid collections. No pelvic mass or lymphadenopathy.  Bones:  Normal alignment of  the spine.  No destructive bone lesions.  IMPRESSION: Two adjacent right middle lobe nodules measuring 19 and 16 mm maximal diameter. Mild spiculation. Appearances worrisome for malignancy. No evidence of significant lymphadenopathy in the chest. No evidence of metastatic disease in the abdomen or pelvis. Nodular enlargement of the uterus suggests fibroids.   Electronically Signed   By: Lucienne Capers M.D.   On: 06/12/2014 22:18   Mr Jeri Cos TL Contrast  06/20/2014   CLINICAL DATA:  Metastatic lung cancer. Stereotactic radiosurgery targeting  EXAM: MRI HEAD WITHOUT AND WITH CONTRAST  TECHNIQUE: Multiplanar, multiecho pulse sequences of the brain and surrounding structures were obtained without and with intravenous contrast.  CONTRAST:  15 mL MultiHance IV  COMPARISON:  MRI head 06/10/2014  FINDINGS: SRS protocol with thin sections performed at 3 Tesla.  Ring-enhancing mass lesion in the left posterior temporal lobe measures 12 x 14 mm and is unchanged. There is  irregular ring enhancement with central necrosis. There is surrounding white matter edema which is slightly improved since the prior study likely related to steroid treatment. No shift of the midline structures. No other enhancing lesion identified.  Ventricle size is normal. Negative for acute or chronic ischemia. Brainstem and cerebellum normal.  Negative for intracranial hemorrhage.  Pituitary normal in size.  Calvarium intact.  IMPRESSION: Solitary metastatic deposit left posterior temporal lobe measuring 12 x 14 mm. Improvement in surrounding vasogenic edema. No other enhancing lesions.   Electronically Signed   By: Franchot Gallo M.D.   On: 06/20/2014 14:43   Mr Jeri Cos VQ Contrast  06/10/2014   CLINICAL DATA:  Headache, weakness and nausea.  EXAM: MRI HEAD WITHOUT AND WITH CONTRAST  TECHNIQUE: Multiplanar, multiecho pulse sequences of the brain and surrounding structures were obtained without and with intravenous contrast.  CONTRAST:  26mL  MULTIHANCE GADOBENATE DIMEGLUMINE 529 MG/ML IV SOLN  COMPARISON:  Head CT same day  FINDINGS: There is a 14 mm in diameter ring enhancing lesion at the inferior temporal lobe on the left. Wall thickness is fairly uniform measuring about 2 mm. Internal material shows fluid intensity. Pronounced surrounding vasogenic edema. No restricted diffusion. Remainder the brain is normal. No second lesion. No ischemic infarction. No hemorrhage, hydrocephalus or extra-axial fluid collection. The adjacent temporal bone and mastoid air cells are clear. No inflammatory sinus disease. No pituitary mass. No calvarial abnormality.  IMPRESSION: 14 mm in diameter ring enhancing lesion at the inferior temporal lobe on the left with pronounced regional vasogenic edema. The imaging findings are most consistent with brain abscess. No restricted diffusion however. The differential diagnosis is that of necrotic neoplasm, but that is felt less likely.   Electronically Signed   By: Nelson Chimes M.D.   On: 06/10/2014 21:25   Ct Abdomen Pelvis W Contrast  06/12/2014   CLINICAL DATA:  Chest pain.  Query cancer.  EXAM: CT CHEST, ABDOMEN, AND PELVIS WITH CONTRAST  TECHNIQUE: Multidetector CT imaging of the chest, abdomen and pelvis was performed following the standard protocol during bolus administration of intravenous contrast.  CONTRAST:  160mL OMNIPAQUE IOHEXOL 300 MG/ML SOLN, 1 OMNIPAQUE IOHEXOL 300 MG/ML SOLN  COMPARISON:  CT abdomen and pelvis 10/17/2005  FINDINGS: CT CHEST FINDINGS  Two adjacent right middle lobe nodules are demonstrated, 1 measuring 19 mm in the other 16 mm in maximal diameter. Mild spiculation of the margins. The lateral most lesion appears to extend to the pleural surface with probable involvement of the pleura. Appearance is worrisome for malignancy. This could represent primary or metastatic lesion. Mild emphysematous changes in the lungs. No focal airspace disease. No pneumothorax. No pleural effusions.  Normal heart  size. Coronary artery calcifications. Normal caliber thoracic aorta. No aortic aneurysm or dissection. Great vessel origins are patent. No significant lymphadenopathy in the chest. Esophagus is decompressed.  CT ABDOMEN AND PELVIS FINDINGS  Focal fatty infiltration adjacent to the falciform ligament of the liver. No other focal liver lesions. The gallbladder, spleen, pancreas, adrenal glands, kidneys, abdominal aorta, inferior vena cava, and retroperitoneal lymph nodes are unremarkable. Stomach, small bowel, and colon are not abnormally distended. Fluid fills the colon. No free air or free fluid in the abdomen.  Pelvis: Uterus appears enlarged and nodular consistent with fibroids. Endometrium is mildly prominent, nonspecific. Ovaries are not enlarged. Bladder wall is not thickened. No free or loculated pelvic fluid collections. No pelvic mass or lymphadenopathy.  Bones:  Normal alignment of the spine.  No  destructive bone lesions.  IMPRESSION: Two adjacent right middle lobe nodules measuring 19 and 16 mm maximal diameter. Mild spiculation. Appearances worrisome for malignancy. No evidence of significant lymphadenopathy in the chest. No evidence of metastatic disease in the abdomen or pelvis. Nodular enlargement of the uterus suggests fibroids.   Electronically Signed   By: Lucienne Capers M.D.   On: 06/12/2014 22:18   Ct Biopsy  06/14/2014   CLINICAL DATA:  Two separate masses in the right upper lobe and left temporal lobe ring-enhancing intracranial lesion.  EXAM: CT GUIDED CORE BIOPSY OF RIGHT UPPER LOBE LUNG MASS  ANESTHESIA/SEDATION: 2.0  Mg IV Versed; 150 mcg IV Fentanyl  Total Moderate Sedation Time: 30 minutes.  PROCEDURE: The procedure risks, benefits, and alternatives were explained to the patient. Questions regarding the procedure were encouraged and answered. The patient understands and consents to the procedure.  The right lateral chest wall was prepped with Betadine in a sterile fashion, and a  sterile drape was applied covering the operative field. A sterile gown and sterile gloves were used for the procedure. Local anesthesia was provided with 1% Lidocaine.  CT was performed in a supine position with the right arm elevated above the patient's head. After localizing a site for percutaneous skin entry by CT, the skin was marked.  Utilizing CT fluoroscopic guidance, a 17 gauge needle was advanced from a a lateral approach into the right upper lobe. Coaxial core biopsy was performed with an 18 gauge device. A total of 2 core biopsy samples were obtained and placed in formalin.  The Biosentry device was used to place a plug at the pleural entry site upon retraction of the outer needle. Additional CT imaging was performed.  COMPLICATIONS: Tiny right pneumothorax after biopsy. SIR level A: No therapy, no consequence.  FINDINGS: Two adjacent right upper lobe lung lesions are again demonstrated. The more lateral of these 2 lesions measures approximately 1.9 cm. Solid tissue was obtained. The biopsy was complicated by a tiny pneumothorax adjacent to the lesion. The patient was asymptomatic following biopsy. This will be followed by chest x-ray.  IMPRESSION: CT-guided core biopsy of right upper lobe lung lesion. A tiny adjacent right pneumothorax was present following the biopsy. This required no immediate treatment and will be followed by chest x-ray.   Electronically Signed   By: Aletta Edouard M.D.   On: 06/14/2014 12:43      IMPRESSION: 44 y/o woman solitary isolated 14 mm spot of cancer in left temporal lobe from primary right upper lung cancer. Staging PET pending. If no evidence of spread goal may be curative At this point, the patient would potentially benefit from radiotherapy. The options include whole brain irradiation versus stereotactic radiosurgery. There are pros and cons associated with each of these potential treatment options. Whole brain radiotherapy would treat the known metastatic deposits  and help provide some reduction of risk for future brain metastases. However, whole brain radiotherapy carries potential risks including hair loss, subacute somnolence, and neurocognitive changes including a possible reduction in short-term memory. Whole brain radiotherapy also may carry a lower likelihood of tumor control at the treatment sites because of the low-dose used. Stereotactic radiosurgery carries a higher likelihood for local tumor control at the targeted sites with lower associated risk for neurocognitive changes such as memory loss. However, the use of stereotactic radiosurgery in this setting may leave the patient at increased risk for new brain metastases elsewhere in the brain as high as 50-60%. Accordingly, patients who receive  stereotactic radiosurgery in this setting should undergo ongoing surveillance imaging with brain MRI more frequently in order to identify and treat new small brain metastases before they become symptomatic. Stereotactic radiosurgery does carry some different risks, including a risk of radionecrosis.  PLAN: Today, I reviewed the findings and workup thus far with the patient. We discussed the dilemma regarding whole brain radiotherapy versus stereotactic radiosurgery. We discussed the pros and cons of each. We also discussed the logistics and delivery of each. We reviewed the results associated with each of the treatments described above. The patient seems to understand the treatment options and would like to proceed with stereotactic radiosurgery.  I spent 60 minutes minutes face to face with the patient and more than 50% of that time was spent in counseling and/or coordination of care.    This document serves as a record of services personally performed by Tyler Pita, MD. It was created on his behalf by Pearlie Oyster, a trained medical scribe. The creation of this record is based on the scribe's personal observations and the provider's statements to them. This  document has been checked and approved by the attending provider.        ------------------------------------------------  Sheral Apley Tammi Klippel, M.D.

## 2014-06-22 NOTE — Patient Instructions (Signed)
Dexamethasone Taper:   Take 4 mg morning, noon, and night for 5 days,  then, 4 mg morning and night for 10  days  then 2 mg morning and night for 10 days,  then 2 mg once daily for 10 days,  then stop

## 2014-06-22 NOTE — Therapy (Signed)
[place], [place], [address] Phone: [phone]   Fax:  [phone]  Physical Therapy Evaluation  Patient Details  Name: Destiny [name] MRN: [id number] Date of Birth: 09/26/1970 Referring Provider:  Tyler Pita, MD  Encounter Date: 06/22/2014      PT End of Session - 06/22/14 1707    Visit Number 1   Number of Visits 1   Date for PT Re-Evaluation 08/21/14   PT Start Time 58   PT Stop Time 1655   PT Time Calculation (min) 25 min   Activity Tolerance Patient limited by pain   Behavior During Therapy [facility] for tasks assessed/performed      Past Medical History  Diagnosis Date  . Thyroid nodule   . Depression   . Suicide attempt   . SVT (supraventricular tachycardia)     a. Long RP tachycardia;  b. 01/2014 s/p RFCA.    Past Surgical History  Procedure Laterality Date  . Thyroid surgery      Removed thyroid nodule, states still has thyroid; 2010  . Supraventricular tachycardia ablation N/A 01/16/2014    Procedure: SUPRAVENTRICULAR TACHYCARDIA ABLATION;  Surgeon: Evans Lance, MD;  Location: [facility] CATH LAB;  Service: Cardiovascular;  Laterality: N/A;  . Tee without cardioversion N/A 06/12/2014    Procedure: TRANSESOPHAGEAL ECHOCARDIOGRAM (TEE);  Surgeon: Sueanne Margarita, MD;  Location: [facility] ENDOSCOPY;  Service: Cardiovascular;  Laterality: N/A;    There were no vitals filed for this visit.  Visit Diagnosis:  Pain of right lower extremity - Plan: PT plan of care cert/re-cert  Pain of left lower extremity - Plan: PT plan of care cert/re-cert  Midline low back pain without sciatica - Plan: PT plan of care cert/re-cert  Physical deconditioning - Plan: PT plan of care cert/re-cert      Subjective Assessment - 06/22/14 1654    Subjective c/o pain in both legs; reports feeling weak at times with some concern of falling; has had tachycardia (SVT);    Patient is accompained by: Family member   Pertinent History Pt. 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  Pt. is to have a PET scan; will have brain radiosurgery, then possibly chemo and radiation or surgery to chest.  Current smoker; HTN, reports she had "fluid" and arthritis in low back.   Currently in Pain? 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 once   Has the patient had a decrease in activity level because of a fear of falling?  No   Is the patient reluctant to leave their  home because of a fear of falling?  No   Home Environment   Living Enviornment Private residence   Type of [place] to enter  will move to ground floor apartment   Prior Function   Level of Independence Independent with basic ADLs;Independent with homemaking with ambulation;Independent with gait   Leisure used to walk, ride bike for exercise (no bike riding recently)   Observation/Other Assessments   Observations well-appearing woman in her 24s   Functional Tests   Functional tests Sit to Stand   Sit to Stand   Comments 14 times in 30 seconds; mild SOB and fatigue   Posture/Postural Control   Posture/Postural Control Postural limitations    Postural Limitations Forward head   ROM / Strength   AROM / PROM / Strength AROM;Strength   AROM   AROM Assessment Site Lumbar   Lumbar Flexion WFL   Lumbar Extension 75% loss  painful   Lumbar - Right Side Bend WFL   Lumbar - Left Side Bend WFL   Lumbar - Right Rotation WFL  painful   Lumbar - Left Rotation WFL  painful   Strength   Overall Strength Unable to assess  but patient reports her legs feel weak; has good days, bad d   Ambulation/Gait   Ambulation/Gait Yes   Ambulation/Gait Assistance 6: Modified independent (Device/Increase time)  limited endurance, difficulty with steps   Balance   Balance Assessed Yes   Dynamic Standing Balance   Dynamic Standing - Comments reaches forward 10 inches in standing, below average for her age           LYMPHEDEMA/ONCOLOGY QUESTIONNAIRE - 06/22/14 1705    Type   Cancer Type adenocarcinoma of lung, stage IV                        PT Education - 06/22/14 1706    Education provided Yes   Education Details posture, breathing, walking, energy conservation, cough splinting, physical therapy info   Person(s) Educated Patient;Child(ren);Other (comment)  other family   Methods Explanation;Handout   Comprehension Verbalized understanding               Lung Clinic Goals - 06/22/14 1711    Patient will be able to verbalize understanding of the benefit of exercise to decrease fatigue.   Status Achieved   Patient will be able to verbalize the importance of posture.   Status Achieved   Patient will be able to demonstrate diaphragmatic breathing for improved lung function.   Status Achieved   Patient will be able to verbalize understanding of the role of physical therapy to prevent functional decline and who to contact if physical therapy is needed.   Status Achieved             Plan - 06/22/14 1707    Clinical Impression Statement Patient with c/o leg and back pain and limited endurance for walking may  benefit from therapy going forward to improve these conditions; also reports leg weakness.   Pt will benefit from skilled therapeutic intervention in order to improve on the following deficits Decreased activity tolerance;Decreased endurance;Pain   Rehab Potential Fair   PT Frequency One time visit   PT Treatment/Interventions Patient/family education   PT Next Visit Plan None at this time; may benefit from therapy as cancer treatment proceeds, to help with strength and preventing falls as well as endurance.   PT Home Exercise Plan walking,  breathing exercises   Consulted and Agree with Plan of Care Patient;Family member/caregiver         Problem List Patient Active Problem List   Diagnosis Date Noted  . 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 Cancer   . Lung nodules   . Lung cancer   . Poor dentition   . Hyperglycemia 06/11/2014  . Cigarette smoker 06/11/2014  . Brain abscess 06/10/2014  . GERD (gastroesophageal reflux disease) 06/10/2014  . Hypertension 06/10/2014  . Depression   . Paroxysmal supraventricular tachycardia 01/16/2014  . SVT (supraventricular tachycardia) 01/16/2014    Dayami Taitt 06/22/2014, 5:14 PM  [place], [place], [address] Phone: [phone]   Fax:  [place], PT 06/22/2014 5:14 PM

## 2014-06-23 ENCOUNTER — Encounter: Payer: Medicaid Other | Admitting: Surgery

## 2014-06-23 ENCOUNTER — Encounter: Payer: Self-pay | Admitting: *Deleted

## 2014-06-23 ENCOUNTER — Ambulatory Visit
Admission: RE | Admit: 2014-06-23 | Discharge: 2014-06-23 | Disposition: A | Discharge status: Home / Self Care | Payer: Medicaid Other | Source: Ambulatory Visit | Attending: Radiation Oncology | Admitting: Radiation Oncology

## 2014-06-23 ENCOUNTER — Ambulatory Visit
Admission: RE | Admit: 2014-06-23 | Discharge: 2014-06-23 | Disposition: A | Discharge status: Home / Self Care | Payer: Medicaid Other | Source: Ambulatory Visit

## 2014-06-23 ENCOUNTER — Encounter (HOSPITAL_COMMUNITY)
Admission: RE | Admit: 2014-06-23 | Discharge: 2014-06-23 | Disposition: A | Discharge status: Home / Self Care | Payer: Medicaid Other | Source: Ambulatory Visit | Attending: Hematology | Admitting: Hematology

## 2014-06-23 VITALS — BP 115/70 | HR 88 | Temp 98.0°F | Resp 16 | Ht 67.0 in | Wt 173.5 lb

## 2014-06-23 DIAGNOSIS — Z510 Encounter for antineoplastic radiation therapy: Secondary | ICD-10-CM | POA: Diagnosis present

## 2014-06-23 DIAGNOSIS — C342 Malignant neoplasm of middle lobe, bronchus or lung: Secondary | ICD-10-CM | POA: Diagnosis present

## 2014-06-23 DIAGNOSIS — C7931 Secondary malignant neoplasm of brain: Secondary | ICD-10-CM

## 2014-06-23 DIAGNOSIS — C3411 Malignant neoplasm of upper lobe, right bronchus or lung: Secondary | ICD-10-CM | POA: Insufficient documentation

## 2014-06-23 DIAGNOSIS — Z87891 Personal history of nicotine dependence: Secondary | ICD-10-CM | POA: Diagnosis not present

## 2014-06-23 DIAGNOSIS — D381 Neoplasm of uncertain behavior of trachea, bronchus and lung: Secondary | ICD-10-CM

## 2014-06-23 LAB — BUN AND CREATININE (CC13)
BUN: 15.6 mg/dL (ref 7.0–26.0)
Creatinine: 0.8 mg/dL (ref 0.6–1.1)
EGFR: >90 mL/min/{1.73_m2} (ref >90)

## 2014-06-23 LAB — GLUCOSE, CAPILLARY: Glucose-Capillary: 111 mg/dL — ABNORMAL HIGH (ref 70–99)

## 2014-06-23 MED ORDER — FLUDEOXYGLUCOSE F - 18 (FDG) INJECTION
8.4200 | Freq: Once | INTRAVENOUS | Status: AC | PRN
  Administered 2014-06-23: 8.42 via INTRAVENOUS

## 2014-06-23 NOTE — Progress Notes (Signed)
  Radiation Oncology         [phone] ________________________________  Name: Destiny [name] MRN: [id number]  Date: 06/23/2014  DOB: [birth year]  SIMULATION AND TREATMENT PLANNING NOTE    ICD-9-CM ICD-10-CM   1. Solitary brain metastasis 198.3 C79.31     DIAGNOSIS:  44 yo woman with a solitary isolated 14 mm left temporal brain metastasis from multifocal adenocarcinoma of the right upper lung - stage IV.  NARRATIVE:  The patient was brought to the [facility].  Identity was confirmed.  All relevant records and images related to the planned course of therapy were reviewed.  The patient freely provided informed written consent to proceed with treatment after reviewing the details related to the planned course of therapy. The consent form was witnessed and verified by the simulation staff. Intravenous access was established for contrast administration. Then, the patient was set-up in a stable reproducible supine position for radiation therapy.  A relocatable thermoplastic stereotactic head frame was fabricated for precise immobilization.  CT images were obtained.  Surface markings were placed.  The CT images were loaded into the planning software and fused with the patient's targeting MRI scan.  Then the target and avoidance structures were contoured.  Treatment planning then occurred.  The radiation prescription was entered and confirmed.  I have requested 3D planning  I have requested a DVH of the following structures: Brain stem, brain, left eye, right eye, lenses, optic chiasm, target volumes, uninvolved brain, and normal tissue.    PLAN:  The patient will receive 20 Gy in 1 fraction.  This document serves as a record of services personally performed by Tyler Pita, MD. It was created on his behalf by Darcus Austin, a trained medical scribe. The creation of this record is based on the scribe's personal observations and the provider's statements to them. This document has been  checked and approved by the attending provider.    ________________________________  Sheral Apley. Tammi Klippel, M.D.

## 2014-06-23 NOTE — [facility] Oncology Navigator Note (Unsigned)
   Thoracic Treatment Summary Name:Suriya KATELEEN ENCARNACION Date:06/23/2014 DOB:[birth year] Your Medical Team Medical Oncologist:Dr. Julien Nordmann Radiation Oncologist:Dr. Tammi Klippel Pulmonologist: Dr Lamonte Sakai  Type and Stage of Lung Cancer Non-Small Cell Carcinoma: Adenocarcinoma  Clinical Stage: Stage IV    Clinical stage is based on radiology exams.  Pathological stage will be determined after surgery.  Staging is based on the size of the tumor, involvement of lymph nodes or not, and whether or not the cancer center has spread. Recommendations Recommendations: Chemo and radiation with brain radiation first  These recommendations are based on information available as of today's consult.  This is subject to change depending further testing or exams. Next Steps Next Step: Radiation Oncology will set up follow up appointments  4/15 [facility] Medical Oncology will set up follow up appointments Barriers to Care What do you perceive as a potential barrier that may prevent you from receiving your treatment plan? Education information given and explained Financial she will be set up with FA Nutrition-will refer to nutritionist   Resources Given: Tobias on [employer] at [employer] [phone] NCI Booklet on Nutritional support   Questions Norton Blizzard, RN BSN Thoracic Oncology Nurse Navigator at [facility] is a nurse navigator that is available to assist you through your cancer journey.  She can answer your questions and/or provide resources regarding your treatment plan, emotional support, or financial concerns.

## 2014-06-25 ENCOUNTER — Encounter (HOSPITAL_COMMUNITY): Payer: Self-pay | Admitting: *Deleted

## 2014-06-25 ENCOUNTER — Emergency Department (HOSPITAL_COMMUNITY)
Admission: EM | Admit: 2014-06-25 | Discharge: 2014-06-25 | Disposition: A | Discharge status: Home / Self Care | Payer: Medicaid Other | Attending: Emergency Medicine | Admitting: Emergency Medicine

## 2014-06-25 DIAGNOSIS — Z85118 Personal history of other malignant neoplasm of bronchus and lung: Secondary | ICD-10-CM | POA: Diagnosis not present

## 2014-06-25 DIAGNOSIS — K1379 Other lesions of oral mucosa: Secondary | ICD-10-CM | POA: Diagnosis present

## 2014-06-25 DIAGNOSIS — Z791 Long term (current) use of non-steroidal anti-inflammatories (NSAID): Secondary | ICD-10-CM | POA: Insufficient documentation

## 2014-06-25 DIAGNOSIS — Z8679 Personal history of other diseases of the circulatory system: Secondary | ICD-10-CM | POA: Diagnosis not present

## 2014-06-25 DIAGNOSIS — Z79899 Other long term (current) drug therapy: Secondary | ICD-10-CM | POA: Insufficient documentation

## 2014-06-25 DIAGNOSIS — Z8639 Personal history of other endocrine, nutritional and metabolic disease: Secondary | ICD-10-CM | POA: Diagnosis not present

## 2014-06-25 DIAGNOSIS — B370 Candidal stomatitis: Secondary | ICD-10-CM | POA: Insufficient documentation

## 2014-06-25 DIAGNOSIS — Z8659 Personal history of other mental and behavioral disorders: Secondary | ICD-10-CM | POA: Insufficient documentation

## 2014-06-25 DIAGNOSIS — Z720 Tobacco use: Secondary | ICD-10-CM | POA: Insufficient documentation

## 2014-06-25 DIAGNOSIS — Z510 Encounter for antineoplastic radiation therapy: Secondary | ICD-10-CM | POA: Diagnosis not present

## 2014-06-25 MED ORDER — NYSTATIN 100000 UNIT/ML MT SUSP
5.0000 mL | Freq: Once | OROMUCOSAL | Status: AC
  Administered 2014-06-25: 500000 [IU] via ORAL
  Filled 2014-06-25: qty 5

## 2014-06-25 MED ORDER — NYSTATIN 100000 UNIT/ML MT SUSP: 500000.0000 [IU] | Freq: Four times a day (QID) | OROMUCOSAL | Status: DC

## 2014-06-25 NOTE — ED Notes (Signed)
The pt has some mouth lesions for 2 days.  She just left the hospital  Last week.  She had antibuiitics and she thinks her lesioins are being caused by the meds

## 2014-06-25 NOTE — ED Notes (Signed)
Pt c/o lesions in mouth since being discharged from the hospital. Pt is on antibiotics. White patches noted to throat and gums

## 2014-06-25 NOTE — Discharge Instructions (Signed)
Thrush, Adult  Destiny [name], also called oral candidiasis, is a fungal infection that develops in the mouth and throat and on the tongue. It causes white patches to form on the mouth and tongue. Destiny [name] is most common in older adults, but it can occur at any age.  Many cases of thrush are mild, but this infection can also be more serious. Destiny [name] can be a recurring problem for people who have chronic illnesses or who take medicines that limit the body's ability to fight infection. Because these people have difficulty fighting infections, the fungus that causes thrush can spread throughout the body. This can cause life-threatening blood or organ infections. CAUSES  Destiny [name] is usually caused by a yeast called Candida albicans. This fungus is normally present in small amounts in the mouth and on other mucous membranes. It usually causes no harm. However, when conditions are present that allow the fungus to grow uncontrolled, it invades surrounding tissues and becomes an infection. Less often, other Candida species can also lead to thrush.  RISK FACTORS Destiny [name] is more likely to develop in the following people:  People with an impaired ability to fight infection (weakened immune system).   Older adults.   People with HIV.   People with diabetes.   People with dry mouth (xerostomia).   Pregnant women.   People with poor dental care, especially those who have false teeth.   People who use antibiotic medicines.  SIGNS AND SYMPTOMS  Destiny [name] can be a mild infection that causes no symptoms. If symptoms develop, they may include:   A burning feeling in the mouth and throat. This can occur at the start of a thrush infection.   White patches that adhere to the mouth and tongue. The tissue around the patches may be red, raw, and painful. If rubbed (during tooth brushing, for example), the patches and the tissue of the mouth may bleed easily.   A bad taste in the mouth or difficulty tasting foods.    Cottony feeling in the mouth.   Pain during eating and swallowing. DIAGNOSIS  Your health care provider can usually diagnose thrush by looking in your mouth and asking you questions about your health.  TREATMENT  Medicines that help prevent the growth of fungi (antifungals) are the standard treatment for thrush. These medicines are either applied directly to the affected area (topical) or swallowed (oral). The treatment will depend on the severity of the condition.  Mild Thrush Mild cases of thrush may clear up with the use of an antifungal mouth rinse or lozenges. Treatment usually lasts about 14 days.  Moderate to Severe Thrush  More severe thrush infections that have spread to the esophagus are treated with an oral antifungal medicine. A topical antifungal medicine may also be used.   For some severe infections, a treatment period longer than 14 days may be needed.   Oral antifungal medicines are almost never used during pregnancy because the fetus may be harmed. However, if a pregnant woman has a rare, severe thrush infection that has spread to her blood, oral antifungal medicines may be used. In this case, the risk of harm to the mother and fetus from the severe thrush infection may be greater than the risk posed by the use of antifungal medicines.  Persistent or Recurrent Thrush For cases of thrush that do not go away or keep coming back, treatment may involve the following:   Treatment may be needed twice as long as the symptoms last.   Treatment will  include both oral and topical antifungal medicines.   People with weakened immune systems can take an antifungal medicine on a continuous basis to prevent thrush infections.  It is important to treat conditions that make you more likely to get thrush, such as diabetes or HIV.  HOME CARE INSTRUCTIONS   Only take over-the-counter or prescription medicine as directed by your health care provider. Talk to your health care  provider about an over-the-counter medicine called gentian violet, which kills bacteria and fungi.   Eat plain, unflavored yogurt as directed by your health care provider. Check the label to make sure the yogurt contains live cultures. This yogurt can help healthy bacteria grow in the mouth that can stop the growth of the fungus that causes thrush.   Try these measures to help reduce the discomfort of thrush:   Drink cold liquids such as water or iced tea.   Try flavored ice treats or frozen juices.   Eat foods that are easy to swallow, such as gelatin, ice cream, or custard.   If the patches in your mouth are painful, try drinking from a straw.   Rinse your mouth several times a day with a warm saltwater rinse. You can make the saltwater mixture with 1 tsp (6 g) of salt in 8 fl oz (0.2 L) of warm water.   If you wear dentures, remove the dentures before going to bed, brush them vigorously, and soak them in a cleaning solution as directed by your health care provider.   Women who are breastfeeding should clean their nipples with an antifungal medicine as directed by their health care provider. Dry the nipples after breastfeeding. Applying lanolin-containing body lotion may help relieve nipple soreness.  SEEK MEDICAL CARE IF:  Your symptoms are getting worse or are not improving within 7 days of starting treatment.   You have symptoms of spreading infection, such as white patches on the skin outside of the mouth.   You are nursing and you have redness, burning, or pain in the nipples that is not relieved with treatment.  MAKE SURE YOU:  Understand these instructions.  Will watch your condition.  Will get help right away if you are not doing well or get worse. Document Released: 11/20/2003 Document Revised: 12/15/2012 Document Reviewed: 09/27/2012 [facility] Patient Information 2015 [place], [place]. This information is not intended to replace advice given to you by your  health care provider. Make sure you discuss any questions you have with your health care provider.

## 2014-06-25 NOTE — ED Provider Notes (Signed)
CSN: [id number]     Arrival date & time 06/25/14  1938 History   First MD Initiated Contact with Patient 06/25/14 2022     Chief Complaint  Patient presents with  . Mouth Lesions    (Consider location/radiation/quality/duration/timing/severity/associated sxs/prior Treatment) Patient is a 44 y.o. female presenting with mouth sores.  Mouth Lesions Location:  Buccal mucosa and oropharynx Buccal mucosa location:  L buccal mucosa and R buccal mucosa Quality:  White Onset quality:  Gradual Severity:  Mild Duration:  2 days Progression:  Worsening Chronicity:  New Context: change in medications   Context comment:  Started Decadron at hospital discharge Relieved by:  None tried Ineffective treatments:  None tried Associated symptoms: no dental pain, no fever, no neck pain, no rash, no rhinorrhea, no sore throat and no swollen glands     Past Medical History  Diagnosis Date  . Thyroid nodule   . Depression   . Suicide attempt   . SVT (supraventricular tachycardia)     a. Long RP tachycardia;  b. 01/2014 s/p RFCA.  . Lung cancer    Past Surgical History  Procedure Laterality Date  . Thyroid surgery      Removed thyroid nodule, states still has thyroid; 2010  . Supraventricular tachycardia ablation N/A 01/16/2014    Procedure: SUPRAVENTRICULAR TACHYCARDIA ABLATION;  Surgeon: Evans Lance, MD;  Location: [facility] CATH LAB;  Service: Cardiovascular;  Laterality: N/A;  . Tee without cardioversion N/A 06/12/2014    Procedure: TRANSESOPHAGEAL ECHOCARDIOGRAM (TEE);  Surgeon: Sueanne Margarita, MD;  Location: [facility] ENDOSCOPY;  Service: Cardiovascular;  Laterality: N/A;   Family History  Problem Relation Age of Onset  . Seizures Mother   . Colon cancer Father   . Hypertension Father    History  Substance Use Topics  . Smoking status: Current Every Day Smoker -- 1.00 packs/day  . Smokeless tobacco: Not on file  . Alcohol Use: Yes     Comment: occ   OB History    No data available       Review of Systems  Constitutional: Negative for fever.  HENT: Positive for mouth sores. Negative for facial swelling, rhinorrhea, sore throat and trouble swallowing.   Musculoskeletal: Negative for neck pain.  Skin: Negative for rash.  All other systems reviewed and are negative.   Allergies  Review of patient's allergies indicates no known allergies.  Home Medications   Prior to Admission medications   Medication Sig Start Date End Date Taking? Authorizing Provider  dexamethasone (DECADRON) 4 MG tablet Take 1 tablet (4 mg total) by mouth as directed. Dexamethasone Taper:  Take 4 mg morning, noon, and night for 5 days, then, 4 mg morning and night for 10  days then 2 mg morning and night for 10 days, the 2 mg once daily for 10 days, then stop 06/22/14   Tyler Pita, MD  HYDROcodone-acetaminophen (NORCO/VICODIN) 5-325 MG per tablet Take 2 tablets by mouth every 4 (four) hours as needed. 06/18/14   Tanna Furry, MD  lisinopril-hydrochlorothiazide (PRINZIDE,ZESTORETIC) 10-12.5 MG per tablet Take 1 tablet by mouth daily.    Historical Provider, MD  LORazepam (ATIVAN) 1 MG tablet  06/19/14   Historical Provider, MD  naproxen (NAPROSYN) 500 MG tablet Take 500 mg by mouth 2 (two) times daily with a meal. 05/01/14   Historical Provider, MD  nicotine (NICODERM CQ) 21 mg/24hr patch Place 1 patch (21 mg total) onto the skin daily. 06/18/14   Tanna Furry, MD  nystatin (MYCOSTATIN) 100000 UNIT/ML  suspension Take 5 mLs (500,000 Units total) by mouth 4 (four) times daily. 06/25/14   Antonietta Breach, PA-C  Olopatadine HCl 0.7 % SOLN Apply 1 drop to eye daily.    Historical Provider, MD  omeprazole (PRILOSEC) 20 MG capsule Take 20 mg by mouth daily as needed (for heartburn).    Historical Provider, MD   BP 118/71 mmHg  Pulse 81  Temp(Src) 98.7 F (37.1 C)  Resp 18  Ht 5\' 7"  (1.702 m)  Wt 173 lb (78.472 kg)  BMI 27.09 kg/m2  SpO2 100%  LMP 06/04/2014   Physical Exam  Constitutional: She is oriented  to person, place, and time. She appears well-developed and well-nourished. No distress.  Nontoxic/nonseptic appearing. Patient pleasant.  HENT:  Head: Normocephalic and atraumatic.  Mouth/Throat: Oral lesions present. No posterior oropharyngeal erythema.  White plaque lesions noted to posterior oropharynx with circular white film to b/l buccal mucosa. Uvula midline. Patient tolerating secretions without difficulty. No angioedema.  Eyes: Conjunctivae and EOM are normal. No scleral icterus.  Neck: Normal range of motion.  Pulmonary/Chest: Effort normal. No respiratory distress.  Respirations even and unlabored  Musculoskeletal: Normal range of motion.  Neurological: She is alert and oriented to person, place, and time. She exhibits normal muscle tone. Coordination normal.  Skin: Skin is warm and dry. No rash noted. She is not diaphoretic. No erythema. No pallor.  Psychiatric: She has a normal mood and affect. Her behavior is normal.  Nursing note and vitals reviewed.   ED Course  Procedures (including critical care time) Labs Review Labs Reviewed - No data to display  Imaging Review No results found.   EKG Interpretation None      MDM   Final diagnoses:  Thrush, oral    44 year old female presents to the emergency department for 2 days of mouth lesions. Physical exam consistent with oral thrush. This is likely from patient recently being started on course of oral steroids. Will manage with the nystatin oral suspension. Have advised primary care follow-up for recheck of symptoms. Return precautions discussed and provided. Patient agreeable to plan with no unaddressed concerns. Patient discharged in good condition.   Filed Vitals:   06/25/14 1945 06/25/14 2049  BP: 123/69 118/71  Pulse: 97 81  Temp: 98.5 F (36.9 C) 98.7 F (37.1 C)  Resp: 18 18  Height: 5\' 7"  (1.702 m)   Weight: 173 lb (78.472 kg)   SpO2: 97% 100%     Antonietta Breach, PA-C 06/25/14 2102  Alfonzo Beers, MD 06/25/14 2102

## 2014-06-26 ENCOUNTER — Encounter: Payer: Self-pay | Admitting: *Deleted

## 2014-06-26 ENCOUNTER — Telehealth: Payer: Self-pay | Admitting: *Deleted

## 2014-06-26 DIAGNOSIS — Z510 Encounter for antineoplastic radiation therapy: Secondary | ICD-10-CM | POA: Diagnosis not present

## 2014-06-26 MED ORDER — SODIUM CHLORIDE 0.9 % IJ SOLN
10.0000 mL | Freq: Once | INTRAMUSCULAR | Status: AC
  Administered 2014-06-23: 10 mL via INTRAVENOUS

## 2014-06-26 NOTE — Progress Notes (Signed)
See IV encounter for vitals and progress note.

## 2014-06-26 NOTE — Progress Notes (Signed)
BUN 15.6 and creatinine 0.8. Lab WDL. Weight and vitals stable. Denies contrast allergy. Denies taking metformin. Denies having a pacemaker. Denies history of XRT. Here today with stepson who helps her manage her appointments and medications. Denies pain. Started left AC 22 gauge IV on the first attempt. Excellent blood return. Flushed without difficulty. Secured IV in place. Patient escorted to CT/SIM.

## 2014-06-26 NOTE — [facility] Oncology Navigator Note (Unsigned)
Patient call me back today.  She needed clarification regarding appt, I clarified.

## 2014-06-26 NOTE — Telephone Encounter (Signed)
Patient's son called left me a vm message to call.  I called to follow up and left a vm message with my name and phone number

## 2014-06-28 ENCOUNTER — Ambulatory Visit (HOSPITAL_COMMUNITY): Payer: Medicaid Other

## 2014-06-29 ENCOUNTER — Encounter: Payer: Self-pay | Admitting: Hematology

## 2014-06-29 ENCOUNTER — Ambulatory Visit
Admission: RE | Admit: 2014-06-29 | Discharge: 2014-06-29 | Disposition: A | Discharge status: Home / Self Care | Payer: Medicaid Other | Source: Ambulatory Visit | Attending: Radiation Oncology | Admitting: Radiation Oncology

## 2014-06-29 ENCOUNTER — Telehealth: Payer: Self-pay | Admitting: Hematology

## 2014-06-29 ENCOUNTER — Ambulatory Visit (HOSPITAL_BASED_OUTPATIENT_CLINIC_OR_DEPARTMENT_OTHER): Payer: Medicaid Other | Admitting: Hematology

## 2014-06-29 ENCOUNTER — Ambulatory Visit: Payer: Medicaid Other

## 2014-06-29 ENCOUNTER — Ambulatory Visit: Admission: RE | Admit: 2014-06-29 | Payer: Medicaid Other | Source: Ambulatory Visit | Admitting: Radiation Oncology

## 2014-06-29 VITALS — BP 127/74 | HR 86 | Temp 98.2°F | Resp 18 | Ht 67.0 in | Wt 172.5 lb

## 2014-06-29 DIAGNOSIS — Z720 Tobacco use: Secondary | ICD-10-CM

## 2014-06-29 DIAGNOSIS — F329 Major depressive disorder, single episode, unspecified: Secondary | ICD-10-CM | POA: Diagnosis not present

## 2014-06-29 DIAGNOSIS — Z510 Encounter for antineoplastic radiation therapy: Secondary | ICD-10-CM | POA: Diagnosis not present

## 2014-06-29 DIAGNOSIS — C7931 Secondary malignant neoplasm of brain: Secondary | ICD-10-CM | POA: Diagnosis not present

## 2014-06-29 DIAGNOSIS — C3411 Malignant neoplasm of upper lobe, right bronchus or lung: Secondary | ICD-10-CM

## 2014-06-29 NOTE — Progress Notes (Signed)
  Radiation Oncology         [phone] ________________________________  Stereotactic Treatment Procedure Note  Name: Destiny [name] MRN: [id number]  Date: 07/03/2014  DOB: December 25, 1970  SPECIAL TREATMENT PROCEDURE  No diagnosis found.  3D TREATMENT PLANNING AND DOSIMETRY:  The patient's radiation plan was reviewed and approved by neurosurgery and radiation oncology prior to treatment.  It showed 3-dimensional radiation distributions overlaid onto the planning CT/MRI image set.  The [facility] for the target structures as well as the organs at risk were reviewed. The documentation of the 3D plan and dosimetry are filed in the radiation oncology EMR.  NARRATIVE:  Destiny [name] was brought to the TrueBeam stereotactic radiation treatment machine and placed supine on the CT couch. The head frame was applied, and the patient was set up for stereotactic radiosurgery.  Neurosurgery was present for the set-up and delivery  SIMULATION VERIFICATION:  In the couch zero-angle position, the patient underwent Exactrac imaging using the Brainlab system with orthogonal KV images.  These were carefully aligned and repeated to confirm treatment position for each of the isocenters.  The Exactrac snap film verification was repeated at each couch angle.  SPECIAL TREATMENT PROCEDURE: Destiny [name] received stereotactic radiosurgery to the following targets: Left temporal 77m target was treated using 4 Dynamic Conformal Arcs to a prescription dose of 20 Gy.  ExacTrac registration was performed for each couch angle.  The 81.3% isodose line was prescribed.  6 MV X-rays were delivered in the flattening filter free beam mode.  STEREOTACTIC TREATMENT MANAGEMENT:  Following delivery, the patient was transported to nursing in stable condition and monitored for possible acute effects.  Vital signs were recorded BP 125/65 mmHg  Pulse 79  Temp(Src) 98.8 F (37.1 C) (Oral)  Resp 16  SpO2 100%  LMP 06/04/2014. The patient  tolerated treatment without significant acute effects, and was discharged to home in stable condition.    PLAN: Follow-up in one month.  This document serves as a record of services personally performed by MTyler Pita MD. It was created on his behalf by EArlyce Harman a trained medical scribe. The creation of this record is based on the scribe's personal observations and the provider's statements to them. This document has been checked and approved by the attending provider.     ________________________________  MSheral Apley MTammi Klippel M.D.

## 2014-06-29 NOTE — Progress Notes (Signed)
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 Negative for CK 20, CD X2, estrogen receptor, consistent with lung primary.   06/20/2014 Imaging Brain MRI with and without contrast showed solitary lesion in the left posterior temporal lobe measuring 12 x 14 mm, with peripheral ring enhancement and surrounding vasogenic edema.   06/23/2014 PET scan 2 hypermetabolic pulmonary nodules in the right upper lobe, hypermetabolic right peritracheal and subcarinal nodes, no other distant metastasis.   06/29/2014 - 06/29/2014 Radiation Therapy SRS to brain lesion      HISTORY OF PRESENTING ILLNESS:  Destiny [name] 44 y.o. female is here because of recently diagnosed metastatic lung cancer to brain. I saw her in the hospital and she is here for first follow up.   She was admitted on 06/10/14 with 2 week history of constant, frontal headaches, initially believed to be secondary to sinus congestion, not amenable to outpatient management. The patient denied any vertigo, vision changes, seizure activity or gait instability. She denies any dysarthria, or comprehension issues. She denies any fever or chills, but did have night sweats over the last few weeks, and has lost about 8 pounds recently. she denied any chest pain, shortness of breath, cough, or hemoptysis. The patient denied any abdominal pain, but she did have nausea without vomiting. She denies any urinary or  bowel incontinence. He denies any bleeding issues. The patient is a smoker.   CT of the head Without contrast on 06/10/14 revealed abnormal low density within the left temporal lobe suspicious for stroke versus a mass lesion. Follow-up MRI of the brain with and without contrast on 06/10/14 showed A 14 mm ring enhancing lesion at the inferior temporal lobe on the left with pronounced regional vasogenic edema, suspicious for brain abscess versus metastasis.She was placed on IV Decadron with improvement of symptoms. 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 CT of the chest was remarkable for 2 adjacent right middle lobe nodules, mildly spiculated, with one of them showing possible involvement of the pleura, suspicious for malignancy. CT of the abdomen and pelvis is negative for metastatic findings. She underwent to the right lung nodule biopsy in the hospital, which reviewed adenocarcinoma, consistent with lung primary. She was discharged home after the biopsy.  She feels better overall after discharge, she still has moderate headache, she takes vicodin and tramadol, she also has bilateral thigh pain, and some weakness, no urine or stool incontinence. She is on laxative for constipation. She has moderate fatigue, but able to do light house work, she is up most of time. Her appetite is  Good, no weight loss   She was developed by radiation oncologist Dr. Tammi Klippel right after her hospital discharge. She is scheduled to have [place] radiation to the brain lesion today.  MEDICAL HISTORY:  Past Medical History  Diagnosis Date  . Thyroid nodule   . Depression   .  Suicide attempt   . SVT (supraventricular tachycardia)     a. Long RP tachycardia;  b. 01/2014 s/p RFCA.  . Lung cancer     SURGICAL HISTORY: Past Surgical History  Procedure Laterality Date  . Thyroid surgery      Removed thyroid nodule, states still has thyroid; 2010  .  Supraventricular tachycardia ablation N/A 01/16/2014    Procedure: SUPRAVENTRICULAR TACHYCARDIA ABLATION;  Surgeon: Evans Lance, MD;  Location: [facility] CATH LAB;  Service: Cardiovascular;  Laterality: N/A;  . Tee without cardioversion N/A 06/12/2014    Procedure: TRANSESOPHAGEAL ECHOCARDIOGRAM (TEE);  Surgeon: Sueanne Margarita, MD;  Location: [facility] ENDOSCOPY;  Service: Cardiovascular;  Laterality: N/A;    SOCIAL HISTORY: History   Social History  . Marital Status: Married    Spouse Name: N/A  . Number of Children: 4  . Years of Education: N/A   Occupational History  . Not on file.   Social History Main Topics  . Smoking status: Former Smoker -- 1.00 packs/day for 30 years    Quit date: 06/13/2014  . Smokeless tobacco: Not on file  . 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 Drug Use: No  . Sexual Activity: Yes   Other Topics Concern  . Not on file   Social History Narrative    FAMILY HISTORY: Family History  Problem Relation Age of Onset  . Seizures Mother   . Colon cancer Father   . Hypertension Father     ALLERGIES:  has No Known Allergies.  MEDICATIONS:  Current Outpatient Prescriptions  Medication Sig Dispense Refill  . dexamethasone (DECADRON) 4 MG tablet Take 1 tablet (4 mg total) by mouth as directed. Dexamethasone Taper:  Take 4 mg morning, noon, and night for 5 days, then, 4 mg morning and night for 10  days then 2 mg morning and night for 10 days, the 2 mg once daily for 10 days, then stop 30 tablet 1  . HYDROcodone-acetaminophen (NORCO/VICODIN) 5-325 MG per tablet Take 2 tablets by mouth every 4 (four) hours as needed. 24 tablet 0  . lisinopril-hydrochlorothiazide (PRINZIDE,ZESTORETIC) 10-12.5 MG per tablet Take 1 tablet by mouth daily.    [name] LORazepam (ATIVAN) 1 MG tablet   0  . nicotine (NICODERM CQ) 21 mg/24hr patch Place 1 patch (21 mg total) onto the skin daily. 28 patch 0  . nystatin (MYCOSTATIN) 100000 UNIT/ML suspension  Take 5 mLs (500,000 Units total) by mouth 4 (four) times daily. 60 mL 1  . omeprazole (PRILOSEC) 20 MG capsule Take 20 mg by mouth daily as needed (for heartburn).    . Olopatadine HCl 0.7 % SOLN Apply 1 drop to eye daily.    . RESTASIS 0.05 % ophthalmic emulsion Place 1 drop into both eyes 2 (two) times daily.  0   No current facility-administered medications for this visit.    REVIEW OF SYSTEMS:   Constitutional: Denies fevers, chills or abnormal night sweats Eyes: Denies blurriness of vision, double vision or watery eyes Ears, nose, mouth, throat, and face: Denies mucositis or sore throat Respiratory: Denies cough, dyspnea or wheezes Cardiovascular: Denies palpitation, chest discomfort or lower extremity swelling Gastrointestinal:  Denies nausea, heartburn or change in bowel habits Skin: Denies abnormal skin rashes Lymphatics: Denies new lymphadenopathy or easy bruising Neurological:Denies numbness, tingling or new weaknesses Behavioral/Psych: Mood is stable, no new changes  All other systems were reviewed with the patient and are negative.  PHYSICAL EXAMINATION: ECOG PERFORMANCE STATUS: 1 - Symptomatic but completely ambulatory  Filed Vitals:   06/29/14 1105  BP: 127/74  Pulse: 86  Temp: 98.2 F (36.8 C)  Resp: 18   Filed Weights   06/29/14 1105  Weight: 172 lb 8 oz (78.245 kg)    GENERAL:alert, no distress and comfortable SKIN: skin color, texture, turgor are normal, no rashes or significant lesions EYES: normal, conjunctiva are pink and non-injected, sclera clear OROPHARYNX:no exudate, no erythema and lips, buccal mucosa, and tongue normal  NECK: supple, thyroid normal size, non-tender, without nodularity LYMPH:  no palpable lymphadenopathy in the cervical, axillary or inguinal LUNGS: clear to auscultation and percussion with normal breathing effort HEART: regular rate & rhythm and no murmurs and no lower extremity edema ABDOMEN:abdomen soft, non-tender and normal  bowel sounds Musculoskeletal:no cyanosis of digits and no clubbing  PSYCH: alert & oriented x 3 with fluent speech NEURO: no focal motor/sensory deficits  LABORATORY DATA:  I have reviewed the data as listed Lab Results  Component Value Date   WBC 12.9* 06/14/2014   HGB 12.6 06/14/2014   HCT 36.7 06/14/2014   MCV 88.0 06/14/2014   PLT 275 06/14/2014    Recent Labs  04/25/14 1817 06/10/14 1858 06/10/14 2313 06/11/14 0922 06/23/14 1421  NA 136 137  --  135  --   K 3.9 3.9  --  3.7  --   CL 105 106  --  107  --   CO2 24 24  --  20  --   GLUCOSE 90 106*  --  121*  --   BUN 10 6  --  <5* 15.6  CREATININE 0.71 0.55  --  0.57 0.8  CALCIUM 8.7 8.6  --  8.8  --   GFRNONAA >90 >90  --  >90  --   GFRAA >90 >90  --  >90  --   PROT  --   --  6.6 6.5  --   ALBUMIN  --   --  3.4* 3.3*  --   AST  --   --  23 21  --   ALT  --   --  19 17  --   ALKPHOS  --   --  70 60  --   BILITOT  --   --  0.2* 0.7  --   BILIDIR  --   --  <0.1  --   --   IBILI  --   --  NOT CALCULATED  --   --    PATHOLOGY REPORT  Diagnosis 06/14/2014 Lung, needle/core biopsy(ies), right upper lobe - ADENOCARCINOMA, SEE COMMENT. Microscopic Comment The adenocarcinoma demonstrates the following immunophenotype: Cytokeratin 7 - strong diffuse expression Cytokeratin 20 - negative expression. CDX2 - negative expression TTF-1 - patchy moderate strong expression. Napsin A - moderate strong expression GCDFP - negative expression Estrogen receptor - negative expression Overall the morphology and immunophenotype are that of metastatic adenocarcinoma, primary to lung. The case was reviewed with Dr Lyndon Code who concurs.  RADIOGRAPHIC STUDIES: I have personally reviewed the radiological images as listed and agreed with the findings in the report.  Ct Chest Abdomen PelvisW Contrast 06/12/2014    IMPRESSION: Two adjacent right middle lobe nodules measuring 19 and 16 mm maximal diameter. Mild spiculation. Appearances worrisome  for malignancy. No evidence of significant lymphadenopathy in the chest. No evidence of metastatic disease in the abdomen or pelvis. Nodular enlargement of the uterus suggests fibroids.   Electronically Signed  By: Lucienne Capers M.D.   On: 06/12/2014 22:18   Mr Jeri Cos BS Contrast 06/20/2014     IMPRESSION: Solitary metastatic deposit left posterior temporal lobe measuring 12 x 14 mm. Improvement in surrounding vasogenic edema. No other enhancing lesions.   Electronically Signed   By: Franchot Gallo M.D.   On: 06/20/2014 14:43   Nm Pet Image Initial (pi) Whole Body 06/23/2014    IMPRESSION: 1. Two hypermetabolic pulmonary nodules in the right upper lobe are most consists with primary bronchogenic carcinoma. 2. Mediastinal nodal metastasis to right lower paratracheal and subcarinal nodal stations. 3. Focus of metabolic activity within the proximal sigmoid colon. Differential includes physiologic activity within the bowel, a pre cancerous polyp, or adenocarcinoma. Favor physiologic activity. No discrete lesion seen on comparison CT. 4. Metabolic activity associated with mixed density left ovaries is felt to be physiologic.   Electronically Signed   By: Suzy Bouchard M.D.   On: 06/23/2014 12:59    ASSESSMENT & PLAN:  44 year old African-American female with a 30-pack-year smoking history, SVT and depression, presented with symptomatic brain metastasis.  1. 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 -I reviewed the natural history of lung cancer. Giving the stage IV disease, the possibility of cure is very small. With the single brain metastasis, if we treat all the lesions with radiation and chemotherapy, there is a small chance we can cure this disease, however majority patients will still have cancer recurrence after treatment. -She is going to have  brain SRS radiation today -Given her mediastinal lymph node metastasis, I don't think she would benefit from surgery. -I discussed the treatment option of concurrent chemoradiation to her thoracic disease, versus palliative chemotherapy alone. Given her young age, relatively good performance status and organ function, and single brain metastasis which can be well controlled by [facility] radiation, I think concurrent chemoradiation would give her the best chance of disease control, or even cure. -I also discussed with radiation oncologist Dr. Tammi Klippel, who agrees with me about concurrent thoracic radiation with chemotherapy -I have requested her tumor to be tested for Foundation one, to see if she would benefit from targeted therapy. The test result would likely return early next week. -After lengthy discussion, patient agrees with the above recommendations. -I'll see her back next week to review the Foundation one test results, and finalize her treatment plan.  2. Single brain metastasis -She is scheduled to have SRS today -Her dexamethasone is currently being tapered down  3. Smoking cessation -I strongly recommend her to stop smoking. She has a quit since she was diagnosed with cancer  4. Depression -Continue medication   All questions were answered. The patient knows to call the clinic with any problems, questions or concerns. I spent 40 minutes counseling the patient face to face. The total time spent in the appointment was 55 minutes and more than 50% was on counseling.     Truitt Merle, MD 06/29/2014 9:22 PM

## 2014-06-29 NOTE — Progress Notes (Signed)
Checked in new pt with no financial concerns at this time.  Pt's insurance should pay at 100% but she has my card for any billing questions or concerns.

## 2014-06-29 NOTE — Telephone Encounter (Signed)
Gave avs & calendar for April. °

## 2014-06-30 ENCOUNTER — Telehealth: Payer: Self-pay | Admitting: *Deleted

## 2014-06-30 ENCOUNTER — Other Ambulatory Visit: Payer: Self-pay | Admitting: *Deleted

## 2014-06-30 ENCOUNTER — Encounter (HOSPITAL_COMMUNITY): Payer: Self-pay

## 2014-06-30 ENCOUNTER — Encounter: Payer: Medicaid Other | Admitting: Surgery

## 2014-06-30 ENCOUNTER — Encounter: Payer: Self-pay | Admitting: *Deleted

## 2014-06-30 ENCOUNTER — Other Ambulatory Visit: Payer: Self-pay | Admitting: Hematology

## 2014-06-30 DIAGNOSIS — C3411 Malignant neoplasm of upper lobe, right bronchus or lung: Secondary | ICD-10-CM

## 2014-06-30 DIAGNOSIS — C3490 Malignant neoplasm of unspecified part of unspecified bronchus or lung: Secondary | ICD-10-CM

## 2014-06-30 MED ORDER — HYDROCODONE-ACETAMINOPHEN 7.5-500 MG PO TABS: 1.0000 | ORAL_TABLET | Freq: Four times a day (QID) | ORAL | Status: DC | PRN

## 2014-06-30 MED ORDER — HYDROCODONE-ACETAMINOPHEN 7.5-325 MG PO TABS: 1.0000 | ORAL_TABLET | Freq: Four times a day (QID) | ORAL | Status: DC | PRN

## 2014-06-30 NOTE — Telephone Encounter (Signed)
Spoke with Dr. Burr Medico again about pain.  Patients pain complaint is head pain mostly but generalized pain as well.  Dr. Burr Medico prescribed pain medication and gave me prescription.  I called patient back.  I let her know that her prescription was here to be picked up.  I also updated that this medication is stronger so to take one tablet every 6 hours as prescribed.    I spoke to patient about changing providers and she would like to address at another time, once her pain is better.

## 2014-06-30 NOTE — Telephone Encounter (Signed)
TC from [place], pt's step son. He states that patient is continuing to have back and leg pain and is having trouble sleeping at night. Pt was seen yesterday by Dr. Burr Medico. [name] states patient is out of Vicodin and Tramadol. Patient indicates that tramadol works better than the vicodin and she prefers the tramadol.He also states that lorzepam is not helping with sleep issues. Vicodin last filled on 06/18/14 for 24 tablets. Please advise

## 2014-06-30 NOTE — Telephone Encounter (Signed)
I received information for Destiny [name] in radiation that patient would like to change providers.  I called patient to check on situation.  Patient states she wants another med oncologist.  Patient is in a lot of pain.  I stated I would follow up with Dr. Burr Medico about pain med and oncologist.

## 2014-06-30 NOTE — [facility] Oncology Navigator Note (Unsigned)
Patient son called and stated the pain medication is not being filled at pharmacy.  I asked that he go to another pharmacy to get filled.  He stated that prescription is not filled anymore. I asked for clarification, I didn't understand.  I asked that he call triage for further help.     Patient got on the phone and was yelling at me to get her the right prescription.  I listened as she yelled.  I tried to get clarification on her needs.  She finally calmed down and wants management phone number and social worker.  I gave them the phone numbers.    I will update management and Dr. Burr Medico.

## 2014-06-30 NOTE — Telephone Encounter (Signed)
TC from pt's stepson regarding refill of pain meds.  Lortab 7.5/APAP 500 no longer made. Routed call back to Dr. Ernestina Penna nurse for review.

## 2014-06-30 NOTE — Telephone Encounter (Signed)
Family member called to say pain med prescribed is no longer available. Message to Dr Ernestina Penna nurse

## 2014-06-30 NOTE — Telephone Encounter (Signed)
Spoke with Dr. Burr Medico about her pain medication.  She asked that I call the patient back about Vicodin and if that was helpful for pain.  I did.  Patient stated that it helped a little but had to take more of it.  Patient stated she is out of medication.  I will update Dr. Burr Medico.

## 2014-06-30 NOTE — Telephone Encounter (Signed)
Lortab 7.5/500 not available & spoke with pharmacy & changed to 7.5/325.  Pt notified of change & need to p/u script at pharmacy.

## 2014-07-03 ENCOUNTER — Encounter: Payer: Self-pay | Admitting: Radiation Oncology

## 2014-07-03 ENCOUNTER — Encounter: Payer: Self-pay | Admitting: *Deleted

## 2014-07-03 ENCOUNTER — Other Ambulatory Visit: Payer: Self-pay | Admitting: *Deleted

## 2014-07-03 ENCOUNTER — Ambulatory Visit
Admission: RE | Admit: 2014-07-03 | Discharge: 2014-07-03 | Disposition: A | Discharge status: Home / Self Care | Payer: Medicaid Other | Source: Ambulatory Visit | Attending: Radiation Oncology | Admitting: Radiation Oncology

## 2014-07-03 VITALS — BP 125/65 | HR 79 | Temp 98.8°F | Resp 16

## 2014-07-03 DIAGNOSIS — Z510 Encounter for antineoplastic radiation therapy: Secondary | ICD-10-CM | POA: Diagnosis not present

## 2014-07-03 DIAGNOSIS — C7931 Secondary malignant neoplasm of brain: Secondary | ICD-10-CM

## 2014-07-03 DIAGNOSIS — C3411 Malignant neoplasm of upper lobe, right bronchus or lung: Secondary | ICD-10-CM

## 2014-07-03 NOTE — Progress Notes (Signed)
Nurse monitoring complete. Vitals stable. Reports taking decadron 4 mg bid with plans to taper again Saturday. Reports mild frontal headache is no worse. Denies nausea, vomiting, dizziness, diplopia or ringing in the ears. Understands to call [phone] with needs. Understands to limit herself to non strenuous activities for the next 24 hours. Patient ambulated to lobby for discharge with her step son.

## 2014-07-03 NOTE — Op Note (Signed)
Stereotactic Radiosurgery Operative Note  Name: Destiny [name] MRN: [id number]  Date: 07/03/2014  DOB: April 20, 1970  Op Note  Pre Operative Diagnosis:  Metastatic lung cancer  Post Operative Diagnois:  Metastatic lung cancer  3D TREATMENT PLANNING AND DOSIMETRY:  The patient's radiation plan was reviewed and approved by myself (neurosurgery) and Dr. Ledon Snare (radiation oncology) prior to treatment.  It showed 3-dimensional radiation distributions overlaid onto the planning CT/MRI image set.  [facility] for the target structures as well as the organs at risk were reviewed. The documentation of the 3D plan and dosimetry are filed in the radiation oncology EMR.  NARRATIVE:  Destiny [name] was brought to the TrueBeam stereotactic radiation treatment machine and placed supine on the CT couch. The head frame was applied, and the patient was set up for stereotactic radiosurgery.  I was present for the set-up and delivery.  SIMULATION VERIFICATION:  In the couch zero-angle position, the patient underwent Exactrac imaging using the Brainlab system with orthogonal KV images.  These were carefully aligned and repeated to confirm treatment position for each of the isocenters.  The Exactrac snap film verification was repeated at each couch angle.  SPECIAL TREATMENT PROCEDURE: Destiny [name] received stereotactic radiosurgery to the following targets: Left temperal target was treated using 4 Dynamic Conformal Arcs to a prescription dose of 20 Gy.  ExacTrac registration was performed for each couch angle.  The 81.3% isodose line was prescribed.  STEREOTACTIC TREATMENT MANAGEMENT:  Following delivery, the patient was transported to nursing in stable condition and monitored for possible acute effects.  Vital signs were recorded LMP 06/04/2014. The patient tolerated treatment without significant acute effects, and was discharged to home in stable condition.    PLAN: Follow-up in one month.

## 2014-07-03 NOTE — Progress Notes (Signed)
S/p SRS brian x 1 completed, appetite good, taking dexamethasone '4mg'$  tapered now to bid, slight head ache frontal, taking nystatin for her thrush, stated just a little sqeezy,, applied cool moist cloth to patient neck, offered drink, patient requested ice water, gave and patient stated wash cloth helped her queeziness, will continue to monitor patient 1 hour,., she stated pain medication just puts her to sleep, her c/o pain mostly back and legs, Odessa Fleming, social worker in with patient  12:48 PM

## 2014-07-03 NOTE — Progress Notes (Signed)
[place] Work  Clinical Social Work was referred by patient navigator for assessment of psychosocial needs.  Clinical Social Worker met with patient at [facility] after treatment to offer support and assess for needs.  Patient reported some depressed feelings when coming to the cancer center, but stated she did not have the same feelings when she was at home.  CSW and patient discussed common feelings and emotional responses during treatment, and the importance of emotional support.  Patient also requested information on resources for additional support during treatment.  CSW reviewed the [facility] financial assistance program and patient plans to bring required information to financial advocate at her next appointment.  CSW notified financial advocate.  CSW and patient also discussed resources such as transportation, home care, home health, ect..  CSw provided support services and contact information and encouraged patient to call with questions or concerns.          Clinical Social Work interventions: Resources education Emotional support  Johnnye Lana, MSW, LCSW, OSW-C Clinical Social Worker [employer] [phone]

## 2014-07-03 NOTE — Progress Notes (Signed)
  Radiation Oncology         [phone] ________________________________  Name: Destiny [name] MRN: [id number]  Date: 07/03/2014  DOB: 02-13-1971     End of Treatment Note   I ICD-10-CM  1. 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  ExacTrac registration was performed for each couch angle.  The 81.3% isodose line was prescribed.  6 MV X-rays were delivered in the flattening filter free beam mode.  Narrative: The patient tolerated radiation treatment relatively well.     Plan: The patient has completed her brain radiosurgery. The patient will return to radiation oncology clinic for CT simulation Friday to treat her chest with concurrent chemo-RT ________________________________  MSheral Apley MTammi Klippel M.D.

## 2014-07-04 ENCOUNTER — Telehealth: Payer: Self-pay | Admitting: Hematology & Oncology

## 2014-07-04 NOTE — Telephone Encounter (Signed)
Per in basket from [place] pt wants to change care to [place]. Dr. Marin Olp has chart under review

## 2014-07-05 ENCOUNTER — Encounter: Payer: Self-pay | Admitting: *Deleted

## 2014-07-05 ENCOUNTER — Telehealth: Payer: Self-pay | Admitting: Hematology & Oncology

## 2014-07-05 ENCOUNTER — Encounter: Payer: Self-pay | Admitting: Radiation Therapy

## 2014-07-05 NOTE — Telephone Encounter (Signed)
Sent in basket to [place] Dr. Marin Olp doesn't want to see this patient

## 2014-07-05 NOTE — Progress Notes (Signed)
I spoke with Shatha this morning. Her SRS was completed on Monday 4/25. She said that since then she has been experiencing nausea and a slight headache. She has also described very painful "gas in her stomach and throat", that has had her in tears earlier today.   Currently she does not have anything for nausea and she feels that the pain medication that she does have makes her more sleepy than dulling the pain.   Jeyda does not plan on coming to her appointment with Dr. Burr Medico tomorrow. She would prefer to wait on a new Med Onc be assigned to her.   She is aware of her [facility] appointment on Friday and plans to be here for that.    I let her know that I would share this with both Dr. Tammi Klippel and his nurse London Pepper. She is expecting a call back.  Mont Dutton

## 2014-07-05 NOTE — [facility] Oncology Navigator Note (Unsigned)
Looked to see when patient has an appt with Dr. Marin Olp and see there is no appt.  I called Tiffany and she stated Dr. Marin Olp did not want to see the patient and sent the referral back to [place].  She will follow up with another oncologist and let me know who she will be seeing.

## 2014-07-06 ENCOUNTER — Emergency Department (HOSPITAL_COMMUNITY)
Admission: EM | Admit: 2014-07-06 | Discharge: 2014-07-06 | Disposition: A | Discharge status: Home / Self Care | Payer: Medicaid Other | Attending: Emergency Medicine | Admitting: Emergency Medicine

## 2014-07-06 ENCOUNTER — Ambulatory Visit (HOSPITAL_BASED_OUTPATIENT_CLINIC_OR_DEPARTMENT_OTHER): Payer: Medicaid Other | Admitting: Nurse Practitioner

## 2014-07-06 ENCOUNTER — Other Ambulatory Visit: Payer: Medicaid Other

## 2014-07-06 ENCOUNTER — Telehealth: Payer: Self-pay | Admitting: Radiation Oncology

## 2014-07-06 ENCOUNTER — Ambulatory Visit: Payer: Medicaid Other | Admitting: Hematology

## 2014-07-06 ENCOUNTER — Encounter (HOSPITAL_COMMUNITY): Payer: Self-pay | Admitting: *Deleted

## 2014-07-06 ENCOUNTER — Telehealth: Payer: Self-pay | Admitting: *Deleted

## 2014-07-06 VITALS — BP 109/57 | HR 89 | Temp 98.4°F | Resp 16 | Wt 186.5 lb

## 2014-07-06 DIAGNOSIS — M791 Myalgia, unspecified site: Secondary | ICD-10-CM

## 2014-07-06 DIAGNOSIS — I471 Supraventricular tachycardia: Secondary | ICD-10-CM | POA: Insufficient documentation

## 2014-07-06 DIAGNOSIS — Z87891 Personal history of nicotine dependence: Secondary | ICD-10-CM | POA: Diagnosis not present

## 2014-07-06 DIAGNOSIS — M79606 Pain in leg, unspecified: Secondary | ICD-10-CM | POA: Diagnosis present

## 2014-07-06 DIAGNOSIS — Z79899 Other long term (current) drug therapy: Secondary | ICD-10-CM | POA: Diagnosis not present

## 2014-07-06 DIAGNOSIS — C3411 Malignant neoplasm of upper lobe, right bronchus or lung: Secondary | ICD-10-CM

## 2014-07-06 DIAGNOSIS — F329 Major depressive disorder, single episode, unspecified: Secondary | ICD-10-CM | POA: Insufficient documentation

## 2014-07-06 DIAGNOSIS — Z85118 Personal history of other malignant neoplasm of bronchus and lung: Secondary | ICD-10-CM | POA: Insufficient documentation

## 2014-07-06 DIAGNOSIS — G8929 Other chronic pain: Secondary | ICD-10-CM | POA: Diagnosis not present

## 2014-07-06 DIAGNOSIS — C7931 Secondary malignant neoplasm of brain: Secondary | ICD-10-CM | POA: Diagnosis not present

## 2014-07-06 LAB — BASIC METABOLIC PANEL
Anion gap: 7 (ref 5–15)
BUN: 27 mg/dL — AB (ref 6–23)
CALCIUM: 8.5 mg/dL (ref 8.4–10.5)
CHLORIDE: 100 mmol/L (ref 96–112)
CO2: 25 mmol/L (ref 19–32)
Creatinine, Ser: 1.08 mg/dL (ref 0.50–1.10)
GFR calc non Af Amer: 61 mL/min — ABNORMAL LOW (ref >90)
GFR, EST AFRICAN AMERICAN: 71 mL/min — AB (ref >90)
Glucose, Bld: 89 mg/dL (ref 70–99)
Potassium: 4.8 mmol/L (ref 3.5–5.1)
Sodium: 132 mmol/L — ABNORMAL LOW (ref 135–145)

## 2014-07-06 LAB — CBC WITH DIFFERENTIAL/PLATELET
Basophils Absolute: 0 10*3/uL (ref 0.0–0.1)
Basophils Relative: 0 % (ref 0–1)
EOS ABS: 0.1 10*3/uL (ref 0.0–0.7)
EOS PCT: 1 % (ref 0–5)
HCT: 33.4 % — ABNORMAL LOW (ref 36.0–46.0)
Hemoglobin: 11.4 g/dL — ABNORMAL LOW (ref 12.0–15.0)
LYMPHS ABS: 3.3 10*3/uL (ref 0.7–4.0)
Lymphocytes Relative: 26 % (ref 12–46)
MCH: 30 pg (ref 26.0–34.0)
MCHC: 34.1 g/dL (ref 30.0–36.0)
MCV: 87.9 fL (ref 78.0–100.0)
MONOS PCT: 9 % (ref 3–12)
Monocytes Absolute: 1.2 10*3/uL — ABNORMAL HIGH (ref 0.1–1.0)
NEUTROS ABS: 8.2 10*3/uL — AB (ref 1.7–7.7)
NEUTROS PCT: 64 % (ref 43–77)
Platelets: 253 10*3/uL (ref 150–400)
RBC: 3.8 MIL/uL — AB (ref 3.87–5.11)
RDW: 14.6 % (ref 11.5–15.5)
WBC: 12.7 10*3/uL — AB (ref 4.0–10.5)

## 2014-07-06 LAB — D-DIMER, QUANTITATIVE (NOT AT ARMC): D DIMER QUANT: 0.27 ug{FEU}/mL (ref 0.00–0.48)

## 2014-07-06 LAB — CK: Total CK: 109 U/L (ref 7–177)

## 2014-07-06 MED ORDER — MORPHINE SULFATE 4 MG/ML IJ SOLN
4.0000 mg | Freq: Once | INTRAMUSCULAR | Status: DC
  Filled 2014-07-06: qty 1

## 2014-07-06 MED ORDER — KETOROLAC TROMETHAMINE 30 MG/ML IJ SOLN
30.0000 mg | Freq: Once | INTRAMUSCULAR | Status: AC
  Administered 2014-07-06: 30 mg via INTRAVENOUS
  Filled 2014-07-06: qty 1

## 2014-07-06 NOTE — ED Notes (Signed)
Pt c/o bilateral leg pain, shooting down. Woke pt out of sleep at 3am. Diagnosed with lung cancer 1 week ago, has had one treatment of radation.

## 2014-07-06 NOTE — Telephone Encounter (Signed)
Returned voicemail message left by patient. Patient left voicemail message requesting pain medication for "knee cap pain 15 on a scale of 0-10." No answer. Left message explaining Norton Blizzard is attempting to get her an appointment with a medical oncologist to help manage her care.

## 2014-07-06 NOTE — Telephone Encounter (Signed)
Patient called and stated,"I'm at the ER and all they want to do is give me pain medication through an IV. That pain medicine isn't going to last long. So, I left." Asked patient why she missed her scheduled appointment with Dr Burr Medico today. Patient stated,"I don't want her as my doctor anymore. She doesn't do anything. There suppose to be looking for a new doctor for me." Asked patient if she could come this afternoon to see Selena Lesser, NP, in [facility]. Patient stated,"what is she going to give me for pain?" Instructed patient that I can't answer that question. All I can do is get you into see her today, and Cyndee will need to assess you. Patient stated,"OK. I can come after my 12:40 appointment. Make it around 2:30 pm" This RN will send a POF to scheduling. Patient called and complained of bilateral knee pain and it's hard to stand, turn or walk because the pain is a 12 out of 10.

## 2014-07-06 NOTE — Discharge Instructions (Signed)

## 2014-07-06 NOTE — ED Provider Notes (Signed)
CSN: [id number]     Arrival date & time 07/06/14  6226 History   First MD Initiated Contact with Patient 07/06/14 0830     Chief Complaint  Patient presents with  . Leg Pain     (Consider location/radiation/quality/duration/timing/severity/associated sxs/prior Treatment) HPI Comments: Patient history of recently diagnosed lung cancer presents with leg pain. She states her last 2 months she's had some intermittent episodes of leg pain. She describes pain throughout her legs. It starts in her hips and goes all the way down to her feet. She has ongoing back pain medicine for the pain does not radiate from her back. She denies any numbness or weakness in her legs. She says it primarily feels like it's in the muscles of her legs. She denies any leg swelling. She denies any known injuries. The pain is worse with movement and walking. She took a hydrocodone at home without any improvement in symptoms. The pain was worse this morning and she woke up. She says at times it does feel like sharp pins and needles in her knees but the pain seems to radiate up and down her legs.  Patient is a 44 y.o. female presenting with leg pain.  Leg Pain Associated symptoms: no back pain, no fatigue and no fever     Past Medical History  Diagnosis Date  . Thyroid nodule   . Depression   . Suicide attempt   . SVT (supraventricular tachycardia)     a. Long RP tachycardia;  b. 01/2014 s/p RFCA.  . Lung cancer    Past Surgical History  Procedure Laterality Date  . Thyroid surgery      Removed thyroid nodule, states still has thyroid; 2010  . Supraventricular tachycardia ablation N/A 01/16/2014    Procedure: SUPRAVENTRICULAR TACHYCARDIA ABLATION;  Surgeon: Evans Lance, MD;  Location: [facility] CATH LAB;  Service: Cardiovascular;  Laterality: N/A;  . Tee without cardioversion N/A 06/12/2014    Procedure: TRANSESOPHAGEAL ECHOCARDIOGRAM (TEE);  Surgeon: Sueanne Margarita, MD;  Location: [facility] ENDOSCOPY;  Service: Cardiovascular;   Laterality: N/A;   Family History  Problem Relation Age of Onset  . Seizures Mother   . Colon cancer Father   . Hypertension Father    History  Substance Use Topics  . Smoking status: Former Smoker -- 1.00 packs/day for 30 years    Quit date: 06/13/2014  . Smokeless tobacco: Not on file  . 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  HENT: Negative for congestion, rhinorrhea and sneezing.   Eyes: Negative.   Respiratory: Negative for cough, chest tightness and shortness of breath.   Cardiovascular: Negative for chest pain and leg swelling.  Gastrointestinal: Negative for nausea, vomiting, abdominal pain, diarrhea and blood in stool.  Genitourinary: Negative for frequency, hematuria, flank pain and difficulty urinating.  Musculoskeletal: Positive for myalgias. Negative for back pain and arthralgias.  Skin: Negative for rash.  Neurological: Negative for dizziness, speech difficulty, weakness, numbness and headaches.      Allergies  Review of patient's allergies indicates no known allergies.  Home Medications   Prior to Admission medications   Medication Sig Start Date End Date Taking? Authorizing Provider  dexamethasone (DECADRON) 4 MG tablet Take 1 tablet (4 mg total) by mouth as directed. Dexamethasone Taper:  Take 4 mg  morning, noon, and night for 5 days, then, 4 mg morning and night for 10  days then 2 mg morning and night for 10 days, the 2 mg once daily for 10 days, then stop 06/22/14  Yes Tyler Pita, MD  HYDROcodone-acetaminophen (NORCO/VICODIN) 5-325 MG per tablet Take 1 tablet by mouth every 4 (four) hours as needed for moderate pain.  06/18/14  Yes Historical Provider, MD  lisinopril-hydrochlorothiazide (PRINZIDE,ZESTORETIC) 10-12.5 MG per tablet Take 1 tablet by mouth daily.   Yes Historical Provider, MD   LORazepam (ATIVAN) 1 MG tablet Take 1 mg by mouth every 8 (eight) hours as needed for anxiety.  06/19/14  Yes Historical Provider, MD  nicotine (NICODERM CQ) 21 mg/24hr patch Place 1 patch (21 mg total) onto the skin daily. 06/18/14  Yes Tanna Furry, MD  nystatin (MYCOSTATIN) 100000 UNIT/ML suspension Take 5 mLs (500,000 Units total) by mouth 4 (four) times daily. 06/25/14  Yes Antonietta Breach, PA-C  omeprazole (PRILOSEC) 20 MG capsule Take 20 mg by mouth daily as needed (for heartburn).   Yes Historical Provider, MD   BP 100/56 mmHg  Pulse 91  Temp(Src) 97.8 F (36.6 C) (Oral)  Resp 17  SpO2 99%  LMP 06/30/2014 Physical Exam  Constitutional: She is oriented to person, place, and time. She appears well-developed and well-nourished.  HENT:  Head: Normocephalic and atraumatic.  Eyes: Pupils are equal, round, and reactive to light.  Neck: Normal range of motion. Neck supple.  Cardiovascular: Normal rate, regular rhythm and normal heart sounds.   Pulmonary/Chest: Effort normal and breath sounds normal. No respiratory distress. She has no wheezes. She has no rales. She exhibits no tenderness.  Abdominal: Soft. Bowel sounds are normal. There is no tenderness. There is no rebound and no guarding.  Musculoskeletal: Normal range of motion. She exhibits no edema.  Patient has tenderness on palpation of her legs bilaterally. It's tender in the musculature bilaterally. There is no specific joint tenderness. Pedal pulses are intact. There is no swelling to the knee or the ankle joints. There is tenderness in her calves but the pain is not only localized to this area. She has normal sensation in the legs. There is normal motor function in the legs. There is no point tenderness in the lumbar spine.  Lymphadenopathy:    She has no cervical adenopathy.  Neurological: She is alert and oriented to person, place, and time.  Skin: Skin is warm and dry. No rash noted.  Psychiatric: She has a normal mood and affect.     ED Course  Procedures (including critical care time) Labs Review Labs Reviewed  BASIC METABOLIC PANEL - Abnormal; Notable for the following:    Sodium 132 (*)    BUN 27 (*)    GFR calc non Af Amer 61 (*)    GFR calc Af Amer 71 (*)    All other components within normal limits  CBC WITH DIFFERENTIAL/PLATELET - Abnormal; Notable for the following:    WBC 12.7 (*)    RBC 3.80 (*)    Hemoglobin 11.4 (*)    HCT 33.4 (*)    Neutro Abs 8.2 (*)    Monocytes Absolute 1.2 (*)    All other components within normal limits  CK  D-DIMER, QUANTITATIVE    Imaging Review No results found.   EKG Interpretation None      MDM   Final diagnoses:  Myalgia    Patient presents with leg pain bilaterally. It does not seem to be radicular.  She has no neurologic dysfunction. She has no evidence of DVT. She has good circulation and positive pulses. She had a recent PET scan with no evidence of bony metastases. I feel this is likely muscular in nature. She has no significant electrolyte abnormalities. Her sodium is mildly low. She was discharged home in good condition. She was given Toradol and a dose of morphine with improvement of symptoms in the ED. She has a follow-up appointment tomorrow with her oncologist and instruct her to follow-up with her primary care physician as well. She has hydrocodone to take at home. She is also on Decadron so I did not prescribe her anti-inflammatory medications.    Malvin Johns, MD 07/06/14 1044

## 2014-07-06 NOTE — ED Notes (Signed)
Patient refused to allow me to get a BP and pulse oxygen on her after she walked to the bathroom.  Patient states "how long do I have to wait to get pain medication around here".  This RN states I have the medication at bedside but I need a current set of vitals since you were off the monitor.  Patient refused the medication at this time and walked out of the ED without signing.  IV was removed from patient.

## 2014-07-07 ENCOUNTER — Other Ambulatory Visit: Payer: Self-pay | Admitting: *Deleted

## 2014-07-07 ENCOUNTER — Encounter: Payer: Self-pay | Admitting: *Deleted

## 2014-07-07 ENCOUNTER — Ambulatory Visit
Admission: RE | Admit: 2014-07-07 | Discharge: 2014-07-07 | Disposition: A | Discharge status: Home / Self Care | Payer: Medicaid Other | Source: Ambulatory Visit | Attending: Radiation Oncology | Admitting: Radiation Oncology

## 2014-07-07 DIAGNOSIS — C3411 Malignant neoplasm of upper lobe, right bronchus or lung: Secondary | ICD-10-CM

## 2014-07-07 DIAGNOSIS — Z720 Tobacco use: Secondary | ICD-10-CM | POA: Insufficient documentation

## 2014-07-07 DIAGNOSIS — C7931 Secondary malignant neoplasm of brain: Secondary | ICD-10-CM | POA: Diagnosis not present

## 2014-07-07 NOTE — [facility] Oncology Navigator Note (Unsigned)
Spoke with patient today at [facility]  She stated she was feeling good today.  I asked how her pain was and she stated "ok".  I set her up to see Dr. Julien Nordmann on 07/13/14 arrive to cancer center at 1:30 with labs at 1:45.  She and her son verbalized understanding of appt time and place.

## 2014-07-07 NOTE — Progress Notes (Signed)
  Radiation Oncology         [phone] ________________________________  Name: Destiny [name] MRN: [id number]  Date: 07/07/2014  DOB: 24-May-1970  SIMULATION AND TREATMENT PLANNING NOTE    ICD-9-CM ICD-10-CM   1. Primary cancer of right upper lobe of lung 162.3 C34.11     DIAGNOSIS:  44 yo woman with stage IV lung cancer based on a treated solitary brain met and local stage III disease  NARRATIVE:  The patient was brought to the [place].  Identity was confirmed.  All relevant records and images related to the planned course of therapy were reviewed.  The patient freely provided informed written consent to proceed with treatment after reviewing the details related to the planned course of therapy. The consent form was witnessed and verified by the simulation staff.  Then, the patient was set-up in a stable reproducible  supine position for radiation therapy.  CT images were obtained.  Surface markings were placed.  The CT images were loaded into the planning software.  Then the target and avoidance structures were contoured.  Treatment planning then occurred.  The radiation prescription was entered and confirmed.  Then, I designed and supervised the construction of a total of 6 medically necessary complex treatment devices consisting of a BodyFix immobilization device and 5 multileaf collimators to conformally shaped radiation around her primary tumor and involved lymphadenopathy while shielding critical structures including the spinal cord and heart.  I have requested : 3D Simulation  I have requested a DVH of the following structures: Left lung, right lung, heart, spinal cord, esophagus, and target volumes.  I have ordered:CBC  SPECIAL TREATMENT PROCEDURE:  The planned course of therapy using radiation constitutes a special treatment procedure. Special care is required in the management of this patient for the following reasons. Concurrent chemotherapy requiring careful  monitoring for increased toxicities of treatment including weekly laboratory values. The special nature of the planned course of radiotherapy will require increased physician supervision and oversight to ensure patient's safety with optimal treatment outcomes.  PLAN:  The patient will receive 66 Gy in 33 fraction.  ________________________________  Sheral Apley Tammi Klippel, M.D.

## 2014-07-07 NOTE — Progress Notes (Signed)
  Radiation Oncology         [phone] ________________________________  Name: Destiny [name] MRN: [id number]  Date: 07/07/2014  DOB: May 18, 1970  SIMULATION AND TREATMENT PLANNING NOTE    ICD-9-CM ICD-10-CM   1. Primary cancer of right upper lobe of lung 162.3 C34.11     DIAGNOSIS:  44 yo woman with stage IV lung cancer based on a treated solitary brain met and local stage III disease  NARRATIVE:  The patient was brought to the [facility].  Identity was confirmed.  All relevant records and images related to the planned course of therapy were reviewed.  The patient freely provided informed written consent to proceed with treatment after reviewing the details related to the planned course of therapy. The consent form was witnessed and verified by the simulation staff.  Then, the patient was set-up in a stable reproducible  supine position for radiation therapy.  CT images were obtained.  Surface markings were placed.  The CT images were loaded into the planning software.  Then the target and avoidance structures were contoured.  Treatment planning then occurred.  The radiation prescription was entered and confirmed.  Then, I designed and supervised the construction of a total of 6 medically necessary complex treatment devices consisting of a BodyFix immobilization device and 5 multileaf collimators to conformally shaped radiation around her primary tumor and involved lymphadenopathy while shielding critical structures including the spinal cord and heart.  I have requested : 3D Simulation  I have requested a DVH of the following structures: Left lung, right lung, heart, spinal cord, esophagus, and target volumes.  I have ordered:CBC  SPECIAL TREATMENT PROCEDURE:  The planned course of therapy using radiation constitutes a special treatment procedure. Special care is required in the management of this patient for the following reasons. Concurrent chemotherapy requiring careful  monitoring for increased toxicities of treatment including weekly laboratory values. The special nature of the planned course of radiotherapy will require increased physician supervision and oversight to ensure patient's safety with optimal treatment outcomes.  PLAN:  The patient will receive 66 Gy in 33 fraction.  This document serves as a record of services personally performed by Tyler Pita, MD. It was created on his behalf by Darcus Austin, a trained medical scribe. The creation of this record is based on the scribe's personal observations and the provider's statements to them. This document has been checked and approved by the attending provider.    ________________________________  Sheral Apley. Tammi Klippel, M.D.

## 2014-07-10 ENCOUNTER — Encounter: Payer: Self-pay | Admitting: Nurse Practitioner

## 2014-07-10 ENCOUNTER — Telehealth: Payer: Self-pay | Admitting: Radiation Oncology

## 2014-07-10 DIAGNOSIS — G8929 Other chronic pain: Secondary | ICD-10-CM | POA: Insufficient documentation

## 2014-07-10 DIAGNOSIS — C3411 Malignant neoplasm of upper lobe, right bronchus or lung: Secondary | ICD-10-CM | POA: Diagnosis not present

## 2014-07-10 NOTE — Telephone Encounter (Signed)
If the patient has a headache that does not respond to OTC meds, I would consider decadron 2 mg BID, rather than opioids.  MM

## 2014-07-10 NOTE — Telephone Encounter (Signed)
Received message from patient's step son, Destiny [name], requesting a return call. Phoned back. Spoke with the patient. She reports continued daily frontal headaches and nausea. Denies vomiting, diplopia or ringing in the ears. Reports that she has been without decadron for several days now. Also, requesting refill of hydrocodone. Patient understands this RN will call back with direction after consulting with Dr. Tammi Klippel

## 2014-07-10 NOTE — Assessment & Plan Note (Signed)
Patient has recently been diagnosed with lung cancer; with brain metastasis.   She is scheduled to be presented at the lung clinic on 07/13/2014. She is scheduled to initiate radiation therapy on 07/17/2014.

## 2014-07-10 NOTE — Progress Notes (Signed)
SYMPTOM MANAGEMENT CLINIC   HPI: Destiny [name] 44 y.o. female recently diagnosed with lung cancer with brain metastasis.  Patient will initiate radiation therapy on 07/17/2014.  She is scheduled to be presented at the lung the clinic on 07/13/2014.  Patient has a history of chronic pain; with specific pain to her lower back.  Patient states that she has met with her orthopedist in the past for the severe arthritis in her lower back.  Patient presents to the [facility] today with complaint of intense bilateral knee pain.  She denies any known injury or trauma to her knees.  Patient actually missed her medical oncology appointment earlier this morning; and instead presented to the emergency department for complaint of her knee pain.  Labs drawn while in the emergency department included a be met, a CK, and a d-dimer.  All labs were centrally normal; with the exception of some very mild hyponatremia.  Patient was given IV pain medication while at the emergency department; and pain was greatly relieved.  However, patient left the emergency department when she discovered she would not be receiving any oral pain medication prescriptions.  Patient is currently rating her bilateral knee pain as a 10 out of 10.  While in the [facility] lobby waiting area patient was observed ambulating with no difficulty whatsoever.  Upon entering the [facility] exam room however, patient was observed slightly limping and moaning in pain.  On exam-no obvious injury or trauma to bilateral knees.  There was no edema, erythema, warmth, or red streaks to knees.  Patient was observed with full range of motion with knees.  Patient was advised to take the hydrocodone that she already has at home to see if that helps.  Also, advised patient she may try some ibuprofen intermittently as well.  Patient has a orthopedist that has managed her back pain in the past.  Advised patient that she should follow-up with the  orthopedist next week for further evaluation and management of her chronic low back and knee pain.  Patient was in agreement with this plan of care.  HPI  ROS  Past Medical History  Diagnosis Date  . Thyroid nodule   . Depression   . Suicide attempt   . SVT (supraventricular tachycardia)     a. Long RP tachycardia;  b. 01/2014 s/p RFCA.  . Lung cancer     Past Surgical History  Procedure Laterality Date  . Thyroid surgery      Removed thyroid nodule, states still has thyroid; 2010  . Supraventricular tachycardia ablation N/A 01/16/2014    Procedure: SUPRAVENTRICULAR TACHYCARDIA ABLATION;  Surgeon: Evans Lance, MD;  Location: [facility] CATH LAB;  Service: Cardiovascular;  Laterality: N/A;  . Tee without cardioversion N/A 06/12/2014    Procedure: TRANSESOPHAGEAL ECHOCARDIOGRAM (TEE);  Surgeon: Sueanne Margarita, MD;  Location: [facility] ENDOSCOPY;  Service: Cardiovascular;  Laterality: N/A;    has Paroxysmal supraventricular tachycardia; SVT (supraventricular tachycardia); Depression; GERD (gastroesophageal reflux disease); Hypertension; Hyperglycemia; Cigarette smoker; Poor dentition; Primary cancer of right upper lobe of lung; Lung nodules; Solitary brain metastasis; and Chronic pain on her problem list.    has No Known Allergies.    Medication List       This list is accurate as of: 07/06/14 11:59 PM.  Always use your most recent med list.               dexamethasone 4 MG tablet  Commonly known as:  DECADRON  Take 1  tablet (4 mg total) by mouth as directed. Dexamethasone Taper:  Take 4 mg morning, noon, and night for 5 days, then, 4 mg morning and night for 10  days then 2 mg morning and night for 10 days, the 2 mg once daily for 10 days, then stop     HYDROcodone-acetaminophen 5-325 MG per tablet  Commonly known as:  NORCO/VICODIN  Take 1 tablet by mouth every 4 (four) hours as needed for moderate pain.     lisinopril-hydrochlorothiazide 10-12.5 MG per tablet  Commonly known as:   PRINZIDE,ZESTORETIC  Take 1 tablet by mouth daily.     LORazepam 1 MG tablet  Commonly known as:  ATIVAN  Take 1 mg by mouth every 8 (eight) hours as needed for anxiety.     nicotine 21 mg/24hr patch  Commonly known as:  NICODERM CQ  Place 1 patch (21 mg total) onto the skin daily.     nystatin 100000 UNIT/ML suspension  Commonly known as:  MYCOSTATIN  Take 5 mLs (500,000 Units total) by mouth 4 (four) times daily.     omeprazole 20 MG capsule  Commonly known as:  PRILOSEC  Take 20 mg by mouth daily as needed (for heartburn).         PHYSICAL EXAMINATION  Oncology Vitals 07/06/2014 07/06/2014 07/06/2014 07/06/2014 07/06/2014 07/06/2014 07/06/2014  Height - - - - - - -  Weight 84.596 kg - - - - - -  Weight (lbs) 186 lbs 8 oz - - - - - -  BMI (kg/m2) - - - - - - -  Temp 98.4 - - - - - -  Pulse 89 83 86 91 80 80 90  Resp _0 SpO2 100 99 98 99 99 99 99  BSA (m2) - - - - - - -   BP Readings from Last 3 Encounters:  07/06/14 109/57  07/06/14 106/57  06/29/14 127/74    Physical Exam  Constitutional: She is oriented to person, place, and time and well-developed, well-nourished, and in no distress.  HENT:  Head: Normocephalic and atraumatic.  Mouth/Throat: Oropharynx is clear and moist.  Eyes: Conjunctivae and EOM are normal. Pupils are equal, round, and reactive to light. Right eye exhibits no discharge. Left eye exhibits no discharge. No scleral icterus.  Neck: Normal range of motion. Neck supple. No JVD present. No tracheal deviation present. No thyromegaly present.  Cardiovascular: Normal rate, regular rhythm, normal heart sounds and intact distal pulses.   Pulmonary/Chest: Effort normal and breath sounds normal. No respiratory distress. She has no wheezes. She has no rales. She exhibits no tenderness.  Abdominal: Soft. Bowel sounds are normal. She exhibits no distension and no mass. There is no tenderness. There is no rebound and no guarding.    Musculoskeletal: Normal range of motion. She exhibits no edema or tenderness.  Lymphadenopathy:    She has no cervical adenopathy.  Neurological: She is alert and oriented to person, place, and time. Gait normal.  Skin: Skin is warm and dry. No rash noted. No erythema. No pallor.  Psychiatric: Affect normal.  Nursing note and vitals reviewed.   LABORATORY DATA:. Admission on 07/06/2014, Discharged on 07/06/2014  Component Date Value Ref Range Status  . Sodium 07/06/2014 132* 135 - 145 mmol/L Final  . Potassium 07/06/2014 4.8  3.5 - 5.1 mmol/L Final  . Chloride 07/06/2014 100  96 - 112 mmol/L Final  . CO2 07/06/2014 25  19 - 32 mmol/L Final  .  Glucose, Bld 07/06/2014 89  70 - 99 mg/dL Final  . BUN 07/06/2014 27* 6 - 23 mg/dL Final  . Creatinine, Ser 07/06/2014 1.08  0.50 - 1.10 mg/dL Final  . Calcium 07/06/2014 8.5  8.4 - 10.5 mg/dL Final  . GFR calc non Af Amer 07/06/2014 61* >90 mL/min Final  . GFR calc Af Amer 07/06/2014 71* >90 mL/min Final   Comment: (NOTE) The eGFR has been calculated using the CKD EPI equation. This calculation has not been validated in all clinical situations. eGFR's persistently <90 mL/min signify possible Chronic Kidney Disease.   . Anion gap 07/06/2014 7  5 - 15 Final  . WBC 07/06/2014 12.7* 4.0 - 10.5 K/uL Final  . RBC 07/06/2014 3.80* 3.87 - 5.11 MIL/uL Final  . Hemoglobin 07/06/2014 11.4* 12.0 - 15.0 g/dL Final  . HCT 07/06/2014 33.4* 36.0 - 46.0 % Final  . MCV 07/06/2014 87.9  78.0 - 100.0 fL Final  . MCH 07/06/2014 30.0  26.0 - 34.0 pg Final  . MCHC 07/06/2014 34.1  30.0 - 36.0 g/dL Final  . RDW 07/06/2014 14.6  11.5 - 15.5 % Final  . Platelets 07/06/2014 253  150 - 400 K/uL Final  . Neutrophils Relative % 07/06/2014 64  43 - 77 % Final  . Neutro Abs 07/06/2014 8.2* 1.7 - 7.7 K/uL Final  . Lymphocytes Relative 07/06/2014 26  12 - 46 % Final  . Lymphs Abs 07/06/2014 3.3  0.7 - 4.0 K/uL Final  . Monocytes Relative 07/06/2014 9  3 - 12 % Final   . Monocytes Absolute 07/06/2014 1.2* 0.1 - 1.0 K/uL Final  . Eosinophils Relative 07/06/2014 1  0 - 5 % Final  . Eosinophils Absolute 07/06/2014 0.1  0.0 - 0.7 K/uL Final  . Basophils Relative 07/06/2014 0  0 - 1 % Final  . Basophils Absolute 07/06/2014 0.0  0.0 - 0.1 K/uL Final  . Total CK 07/06/2014 109  7 - 177 U/L Final  . D-Dimer, Quant 07/06/2014 0.27  0.00 - 0.48 ug/mL-FEU Final   Comment:        AT THE INHOUSE ESTABLISHED CUTOFF VALUE OF 0.48 ug/mL FEU, THIS ASSAY HAS BEEN DOCUMENTED IN THE LITERATURE TO HAVE A SENSITIVITY AND NEGATIVE PREDICTIVE VALUE OF AT LEAST 98 TO 99%.  THE TEST RESULT SHOULD BE CORRELATED WITH AN ASSESSMENT OF THE CLINICAL PROBABILITY OF DVT / VTE.      RADIOGRAPHIC STUDIES: No results found.  ASSESSMENT/PLAN:    Primary cancer of right upper lobe of lung Patient has recently been diagnosed with lung cancer; with brain metastasis.   She is scheduled to be presented at the lung clinic on 07/13/2014. She is scheduled to initiate radiation therapy on 07/17/2014.      Chronic pain Patient has a history of chronic pain; with specific pain to her lower back.  Patient states that she has met with her orthopedist in the past for the severe arthritis in her lower back.  Patient presents to the [facility] today with complaint of intense bilateral knee pain.  She denies any known injury or trauma to her knees.  Patient actually missed her medical oncology appointment earlier this morning; and instead presented to the emergency department for complaint of her knee pain.  Labs drawn while in the emergency department included a be met, a CK, and a d-dimer.  All labs were centrally normal; with the exception of some very mild hyponatremia.  Patient was given IV pain medication while at the  emergency department; and pain was greatly relieved.  However, patient left the emergency department when she discovered she would not be receiving any oral pain  medication prescriptions.  Patient is currently rating her bilateral knee pain as a 10 out of 10.  While in the [facility] lobby waiting area patient was observed ambulating with no difficulty whatsoever.  Upon entering the [facility] exam room however, patient was observed slightly limping and moaning in pain.  On exam-no obvious injury or trauma to bilateral knees.  There was no edema, erythema, warmth, or red streaks to knees.  Patient was observed with full range of motion with knees.  Patient was advised to take the hydrocodone that she already has at home to see if that helps.  Also, advised patient she may try some ibuprofen intermittently as well.  Patient has a orthopedist that has managed her back pain in the past.  Advised patient that she should follow-up with the orthopedist next week for further evaluation and management of her chronic low back and knee pain.  Patient was in agreement with this plan of care.       Patient stated understanding of all instructions; and was in agreement with this plan of care. The patient knows to call the clinic with any problems, questions or concerns.   Review/collaboration with Dr. Julien Nordmann regarding all aspects of patient's visit today.   Total time spent with patient was 25 minutes;  with greater than 75 percent of that time spent in face to face counseling regarding patient's symptoms,  and coordination of care and follow up.  Disclaimer: This note was dictated with voice recognition software. Similar sounding words can inadvertently be transcribed and may not be corrected upon review.   Drue Second, NP 07/10/2014

## 2014-07-10 NOTE — Assessment & Plan Note (Signed)
Patient has a history of chronic pain; with specific pain to her lower back.  Patient states that she has met with her orthopedist in the past for the severe arthritis in her lower back.  Patient presents to the cancer Center today with complaint of intense bilateral knee pain.  She denies any known injury or trauma to her knees.  Patient actually missed her medical oncology appointment earlier this morning; and instead presented to the emergency department for complaint of her knee pain.  Labs drawn while in the emergency department included a be met, a CK, and a d-dimer.  All labs were centrally normal; with the exception of some very mild hyponatremia.  Patient was given IV pain medication while at the emergency department; and pain was greatly relieved.  However, patient left the emergency department when she discovered she would not be receiving any oral pain medication prescriptions.  Patient is currently rating her bilateral knee pain as a 10 out of 10.  While in the cancer Center lobby waiting area patient was observed ambulating with no difficulty whatsoever.  Upon entering the cancer Center exam room however, patient was observed slightly limping and moaning in pain.  On exam-no obvious injury or trauma to bilateral knees.  There was no edema, erythema, warmth, or red streaks to knees.  Patient was observed with full range of motion with knees.  Patient was advised to take the hydrocodone that she already has at home to see if that helps.  Also, advised patient she may try some ibuprofen intermittently as well.  Patient has a orthopedist that has managed her back pain in the past.  Advised patient that she should follow-up with the orthopedist next week for further evaluation and management of her chronic low back and knee pain.  Patient was in agreement with this plan of care.     

## 2014-07-11 ENCOUNTER — Telehealth: Payer: Self-pay | Admitting: Radiation Oncology

## 2014-07-11 NOTE — Telephone Encounter (Signed)
Per Dr. Johny Shears order called in decadron 2 mg bid, qty 4 and no refills to [facility] @ [facility] on [address]. Phoned patient making her aware this was done. Patient verbalized understanding and expressed appreciation for the call.

## 2014-07-12 ENCOUNTER — Telehealth: Payer: Self-pay | Admitting: *Deleted

## 2014-07-12 NOTE — Telephone Encounter (Signed)
Called pt w/ a reminder about clinic and confirmed 07/13/14 appt w/ her.  Gave directions and instructions.

## 2014-07-13 ENCOUNTER — Ambulatory Visit: Payer: Medicaid Other | Admitting: Hematology

## 2014-07-13 ENCOUNTER — Telehealth: Payer: Self-pay | Admitting: *Deleted

## 2014-07-13 ENCOUNTER — Telehealth: Payer: Self-pay | Admitting: Internal Medicine

## 2014-07-13 ENCOUNTER — Ambulatory Visit (HOSPITAL_BASED_OUTPATIENT_CLINIC_OR_DEPARTMENT_OTHER): Payer: Medicaid Other | Admitting: Internal Medicine

## 2014-07-13 ENCOUNTER — Encounter: Payer: Self-pay | Admitting: *Deleted

## 2014-07-13 ENCOUNTER — Ambulatory Visit: Payer: Medicaid Other | Admitting: Internal Medicine

## 2014-07-13 ENCOUNTER — Other Ambulatory Visit (HOSPITAL_BASED_OUTPATIENT_CLINIC_OR_DEPARTMENT_OTHER): Payer: Medicaid Other

## 2014-07-13 ENCOUNTER — Ambulatory Visit: Payer: Medicaid Other | Admitting: Physical Therapy

## 2014-07-13 ENCOUNTER — Encounter: Payer: Self-pay | Admitting: Internal Medicine

## 2014-07-13 VITALS — BP 140/81 | HR 86 | Temp 98.2°F | Resp 17 | Ht 67.0 in | Wt 183.7 lb

## 2014-07-13 DIAGNOSIS — G8929 Other chronic pain: Secondary | ICD-10-CM

## 2014-07-13 DIAGNOSIS — C3411 Malignant neoplasm of upper lobe, right bronchus or lung: Secondary | ICD-10-CM | POA: Diagnosis not present

## 2014-07-13 DIAGNOSIS — F329 Major depressive disorder, single episode, unspecified: Secondary | ICD-10-CM | POA: Diagnosis not present

## 2014-07-13 DIAGNOSIS — C7931 Secondary malignant neoplasm of brain: Secondary | ICD-10-CM

## 2014-07-13 LAB — CBC WITH DIFFERENTIAL/PLATELET
BASO%: 0 % (ref 0.0–2.0)
Basophils Absolute: 0 10*3/uL (ref 0.0–0.1)
EOS ABS: 0 10*3/uL (ref 0.0–0.5)
EOS%: 0.2 % (ref 0.0–7.0)
HEMATOCRIT: 36.1 % (ref 34.8–46.6)
HEMOGLOBIN: 12.4 g/dL (ref 11.6–15.9)
LYMPH%: 13.5 % — AB (ref 14.0–49.7)
MCH: 30.5 pg (ref 25.1–34.0)
MCHC: 34.3 g/dL (ref 31.5–36.0)
MCV: 88.7 fL (ref 79.5–101.0)
MONO#: 0.8 10*3/uL (ref 0.1–0.9)
MONO%: 8.9 % (ref 0.0–14.0)
NEUT%: 77.4 % — AB (ref 38.4–76.8)
NEUTROS ABS: 7.2 10*3/uL — AB (ref 1.5–6.5)
PLATELETS: 226 10*3/uL (ref 145–400)
RBC: 4.07 10*6/uL (ref 3.70–5.45)
RDW: 14.4 % (ref 11.2–14.5)
WBC: 9.3 10*3/uL (ref 3.9–10.3)
lymph#: 1.3 10*3/uL (ref 0.9–3.3)

## 2014-07-13 LAB — COMPREHENSIVE METABOLIC PANEL (CC13)
ALT: 100 U/L — AB (ref 0–55)
AST: 56 U/L — AB (ref 5–34)
Albumin: 3.6 g/dL (ref 3.5–5.0)
Alkaline Phosphatase: 87 U/L (ref 40–150)
Anion Gap: 14 mEq/L — ABNORMAL HIGH (ref 3–11)
BUN: 11.7 mg/dL (ref 7.0–26.0)
CO2: 23 meq/L (ref 22–29)
Calcium: 9.3 mg/dL (ref 8.4–10.4)
Chloride: 100 mEq/L (ref 98–109)
Creatinine: 0.7 mg/dL (ref 0.6–1.1)
EGFR: >90 mL/min/{1.73_m2} (ref >90)
Glucose: 92 mg/dl (ref 70–140)
POTASSIUM: 3.9 meq/L (ref 3.5–5.1)
Sodium: 138 mEq/L (ref 136–145)
TOTAL PROTEIN: 6.9 g/dL (ref 6.4–8.3)
Total Bilirubin: 0.25 mg/dL (ref 0.20–1.20)

## 2014-07-13 MED ORDER — FLUCONAZOLE 100 MG PO TABS: 100.0000 mg | ORAL_TABLET | Freq: Every day | ORAL | Status: DC

## 2014-07-13 MED ORDER — MIRTAZAPINE 30 MG PO TABS: 30.0000 mg | ORAL_TABLET | Freq: Every day | ORAL | Status: DC

## 2014-07-13 MED ORDER — PROCHLORPERAZINE MALEATE 10 MG PO TABS: 10.0000 mg | ORAL_TABLET | Freq: Four times a day (QID) | ORAL | Status: DC | PRN

## 2014-07-13 MED ORDER — HYDROCODONE-ACETAMINOPHEN 5-325 MG PO TABS: 1.0000 | ORAL_TABLET | ORAL | Status: DC | PRN

## 2014-07-13 NOTE — Telephone Encounter (Signed)
Called patient and son to see if they are able to come to clinic at 1.  I left vm messages on phones

## 2014-07-13 NOTE — Telephone Encounter (Signed)
Labs/ov per 05/05 POF scheduled, D/T per MD..... Pt aware... KJ

## 2014-07-13 NOTE — Telephone Encounter (Signed)
Patient called back. She will be able to come at 1p

## 2014-07-13 NOTE — Progress Notes (Signed)
[place] [phone]   [phone] Multidisciplinary thoracic oncology clinic  OFFICE PROGRESS NOTE  [address], [address] [place] [address]  DIAGNOSIS: Stage IV (T3, N2, M1b) non-small cell lung cancer, adenocarcinoma with negative EGFR mutation and negative for gene translocation diagnosed in April 2016  PRIOR THERAPY: Stereotactic radiotherapy to the solitary brain lesion under the care of Dr. Tammi Klippel completed 07/03/2014.  CURRENT THERAPY: Concurrent chemoradiation with weekly carboplatin for AUC of 2 and paclitaxel 45 MG/M2. First dose expected 07/17/2014.  INTERVAL HISTORY: Destiny [name] 44 y.o. female returns to the clinic today for follow-up visit and to establish care with me after she transferred her care from Dr. Burr Medico. The patient was diagnosed with metastatic non-small cell lung cancer when she presented with and pressure for almost a months. She finally went to the emergency department at [facility] and CT scan of the head was performed on 06/10/2014 and it showed abnormal low-density within the left temporal lobe with configuration concerning for a mass or subacute stroke. MRI of the brain was performed on 06/10/2014 and it showed 1.4 cm in diameter ring-enhancing lesion at the inferior temporal lobe on the left with pronounced regional vasogenic edema. The findings were concerning for brain abscess versus necrotic metastasis. CT scan of the chest, abdomen and pelvis was performed on 06/12/2014 and it showed 2 adjacent right middle lobe nodules measuring 1.9 and 1.6 cm in maximum diameter. There was mild spiculation and the appearance worrisome for malignancy. There was no evidence of significant lymphadenopathy in the chest. No evidence of metastatic disease in the abdomen or pelvis. On 06/14/2014 the patient underwent CT-guided core biopsy of the right upper lobe lung mass by interventional radiology.  The final pathology (Accession: [id number]) was consistent with adenocarcinoma. The adenocarcinoma demonstrates the following immunophenotype: Cytokeratin 7 - strong diffuse expression, Cytokeratin 20 - negative expression. CDX2 - negative expression, TTF-1 - patchy moderate strong expression, Napsin A - moderate strong expression, GCDFP - negative expression, Estrogen receptor - negative expression. Overall the morphology and immunophenotype are that of metastatic adenocarcinoma, primary to lung. The tissue block was sent to [facility] one for molecular biomarker testing and it was negative for EGFR mutation, ALK gene translocation as well as ROS 1. It was positive for K-RAS Ampilification. The patient had a PET scan performed on 04/24/2014 showed 2 hypermetabolic pulmonary nodules in the right upper lobe consistent with primary bronchogenic carcinoma. There was mediastinal nodal metastasis to the right lower paratracheal and subcarinal nodal stations. There was a focus of metabolic activity within the proximal sigmoid colon questionable for physiologic activity within the bowel, but precancerous polyp or adenocarcinoma cannot be excluded. The patient underwent stereotactic radiotherapy to the solitary brain lesion under the care of Dr. Tammi Klippel on 07/03/2014. She was seen initially for treatment by Dr. Burr Medico but the patient requested transfer of her care to me.  When seen today she is feeling fine except for sore throat and mouth as well as intermittent odynophagia. She is currently on Decadron 4 mg by mouth twice a day. She denied having any significant chest pain, shortness breath, cough or hemoptysis. She gained several pounds recently. The patient denied having any significant nausea or vomiting. She has no headache or blurry vision. Her family history significant for a mother with seizure and father had colon cancer. The patient is married and has 4 children. She was accompanied today by her husband  Bernita Raisin and her son, [name]. She is currently unemployed. She has a history of smoking 1-2 packs per day for around 33 years and quit a month ago. She also has a history of alcohol abuse and quit 10 months ago. She also has a history of marijuana abuse.    MEDICAL HISTORY: Past Medical History  Diagnosis Date  . Thyroid nodule   . Depression   . Suicide attempt   . SVT (supraventricular tachycardia)     a. Long RP tachycardia;  b. 01/2014 s/p RFCA.  . Lung cancer     ALLERGIES:  has No Known Allergies.  MEDICATIONS:  Current Outpatient Prescriptions  Medication Sig Dispense Refill  . dexamethasone (DECADRON) 4 MG tablet Take 1 tablet (4 mg total) by mouth as directed. Dexamethasone Taper:  Take 4 mg morning, noon, and night for 5 days, then, 4 mg morning and night for 10  days then 2 mg morning and night for 10 days, the 2 mg once daily for 10 days, then stop 30 tablet 1  . HYDROcodone-acetaminophen (NORCO/VICODIN) 5-325 MG per tablet Take 1 tablet by mouth every 4 (four) hours as needed for moderate pain.   0  . lisinopril-hydrochlorothiazide (PRINZIDE,ZESTORETIC) 10-12.5 MG per tablet Take 1 tablet by mouth daily.    [name] LORazepam (ATIVAN) 1 MG tablet Take 1 mg by mouth every 8 (eight) hours as needed for anxiety.   0  . nicotine (NICODERM CQ) 21 mg/24hr patch Place 1 patch (21 mg total) onto the skin daily. 28 patch 0  . nystatin (MYCOSTATIN) 100000 UNIT/ML suspension Take 5 mLs (500,000 Units total) by mouth 4 (four) times daily. 60 mL 1  . omeprazole (PRILOSEC) 20 MG capsule Take 20 mg by mouth daily as needed (for heartburn).     No current facility-administered medications for this visit.    SURGICAL HISTORY:  Past Surgical History  Procedure Laterality Date  . Thyroid surgery      Removed thyroid nodule, states still has thyroid; 2010  . Supraventricular tachycardia ablation N/A 01/16/2014    Procedure: SUPRAVENTRICULAR TACHYCARDIA ABLATION;  Surgeon: Evans Lance, MD;   Location: [facility] CATH LAB;  Service: Cardiovascular;  Laterality: N/A;  . Tee without cardioversion N/A 06/12/2014    Procedure: TRANSESOPHAGEAL ECHOCARDIOGRAM (TEE);  Surgeon: Sueanne Margarita, MD;  Location: [facility] ENDOSCOPY;  Service: Cardiovascular;  Laterality: N/A;    REVIEW OF SYSTEMS:  Constitutional: negative Eyes: negative Ears, nose, mouth, throat, and face: positive for sore mouth and sore throat Respiratory: negative Cardiovascular: negative Gastrointestinal: negative Genitourinary:negative Integument/breast: negative Hematologic/lymphatic: negative Musculoskeletal:negative Neurological: negative Behavioral/Psych: negative Endocrine: negative Allergic/Immunologic: negative   PHYSICAL EXAMINATION: General appearance: alert, cooperative and no distress Head: Normocephalic, without obvious abnormality, atraumatic Neck: no adenopathy, no JVD, supple, symmetrical, trachea midline and thyroid not enlarged, symmetric, no tenderness/mass/nodules Lymph nodes: Cervical, supraclavicular, and axillary nodes normal. Resp: clear to auscultation bilaterally Back: symmetric, no curvature. ROM normal. No CVA tenderness. Cardio: regular rate and rhythm, S1, S2 normal, no murmur, click, rub or gallop GI: soft, non-tender; bowel sounds normal; no masses,  no organomegaly Extremities: extremities normal, atraumatic, no cyanosis or edema Neurologic: Alert and oriented X 3, normal strength and tone. Normal symmetric reflexes. Normal coordination and gait  ECOG PERFORMANCE STATUS: 1 - Symptomatic but completely ambulatory  Last menstrual period 06/30/2014.  LABORATORY DATA: Lab Results  Component Value Date   WBC 9.3 07/13/2014   HGB 12.4 07/13/2014   HCT 36.1 07/13/2014   MCV 88.7  07/13/2014   PLT 226 07/13/2014      Chemistry      Component Value Date/Time   NA 132* 07/06/2014 0900   K 4.8 07/06/2014 0900   CL 100 07/06/2014 0900   CO2 25 07/06/2014 0900   BUN 27* 07/06/2014 0900    BUN 15.6 06/23/2014 1421   CREATININE 1.08 07/06/2014 0900   CREATININE 0.8 06/23/2014 1421      Component Value Date/Time   CALCIUM 8.5 07/06/2014 0900   ALKPHOS 60 06/11/2014 0922   AST 21 06/11/2014 0922   ALT 17 06/11/2014 0922   BILITOT 0.7 06/11/2014 0922       RADIOGRAPHIC STUDIES: Dg Chest 1 View  06/14/2014   CLINICAL DATA:  Small right pneumothorax following right upper lobe lung nodule biopsy.  EXAM: CHEST  1 VIEW  COMPARISON:  Imaging during CT-guided lung biopsy earlier today.  FINDINGS: The chest x-ray shows no definite right-sided pneumothorax. Nodular masses are identified in the right upper lobe. No edema, pleural fluid or airspace consolidation. The heart size and mediastinal contours are normal.  IMPRESSION: No pneumothorax visualized after right upper lobe lung lesion biopsy.   Electronically Signed   By: Aletta Edouard M.D.   On: 06/14/2014 15:11   Mr Jeri Cos SW Contrast  06/20/2014   CLINICAL DATA:  Metastatic lung cancer. Stereotactic radiosurgery targeting  EXAM: MRI HEAD WITHOUT AND WITH CONTRAST  TECHNIQUE: Multiplanar, multiecho pulse sequences of the brain and surrounding structures were obtained without and with intravenous contrast.  CONTRAST:  15 mL MultiHance IV  COMPARISON:  MRI head 06/10/2014  FINDINGS: SRS protocol with thin sections performed at 3 Tesla.  Ring-enhancing mass lesion in the left posterior temporal lobe measures 12 x 14 mm and is unchanged. There is irregular ring enhancement with central necrosis. There is surrounding white matter edema which is slightly improved since the prior study likely related to steroid treatment. No shift of the midline structures. No other enhancing lesion identified.  Ventricle size is normal. Negative for acute or chronic ischemia. Brainstem and cerebellum normal.  Negative for intracranial hemorrhage.  Pituitary normal in size.  Calvarium intact.  IMPRESSION: Solitary metastatic deposit left posterior temporal lobe  measuring 12 x 14 mm. Improvement in surrounding vasogenic edema. No other enhancing lesions.   Electronically Signed   By: Franchot Gallo M.D.   On: 06/20/2014 14:43   Nm Pet Image Initial (pi) Whole Body  06/23/2014   CLINICAL DATA:  Subsequent Treatment strategy for metastatic lung cancer. Brain metastasis.  EXAM: NUCLEAR MEDICINE PET WHOLE BODY  TECHNIQUE: 8.4 mCi F-18 FDG was injected intravenously. Full-ring PET imaging was performed from the vertex to the feet after the radiotracer. CT data was obtained and used for attenuation correction and anatomic localization.  FASTING BLOOD GLUCOSE:  Value:  111 mg/dl  COMPARISON:  Brain MRI 06/20/2014,. CT chest abdomen pelvis 06/12/2014.  FINDINGS: Head/Neck: No hypermetabolic lymph nodes in the neck. The brain metastasis in identified by FDG PET brain imaging which is not atypical.  Chest: Right upper lobe nodule measuring 18 mm (image 41 , series 6) has intense metabolic activity with SUV max 6.1. Adjacent 13 mm nodule image 37 is also hypermetabolic with SUV max equal 4.7.  Small hypermetabolic right lower paratracheal lymph node measuring 11 mm with SUV max equal 5.9. Small sub carinal hypermetabolic lymph nodes on image 106 is also hypermetabolic. No supraclavicular nodes.  Abdomen/Pelvis: No abnormal metabolic activity within the liver. Normal adrenal glands.  Focus of hypermetabolic activity associated with the sigmoid colon on image 188 of fused data set. This activity intense with SUV max 13. Focus of apparent stool at this level on level on image 191 of series 4.  There is hypermetabolic activity associated with the left adnexa. The there is a mixed left ovary measuring 4 cm by 4 cm image 198 series 4. The metabolic activity is seen on the fused data set on image 195. The metabolic activity associated lower density portion of the ovary. Right ovary is also mixed density.  Skeleton: No focal hypermetabolic activity to suggest skeletal metastasis.   Extremities: No hypermetabolic activity to suggest metastasis.  IMPRESSION: 1. Two hypermetabolic pulmonary nodules in the right upper lobe are most consists with primary bronchogenic carcinoma. 2. Mediastinal nodal metastasis to right lower paratracheal and subcarinal nodal stations. 3. Focus of metabolic activity within the proximal sigmoid colon. Differential includes physiologic activity within the bowel, a pre cancerous polyp, or adenocarcinoma. Favor physiologic activity. No discrete lesion seen on comparison CT. 4. Metabolic activity associated with mixed density left ovaries is felt to be physiologic.   Electronically Signed   By: Suzy Bouchard M.D.   On: 06/23/2014 12:59   Ct Biopsy  06/14/2014   CLINICAL DATA:  Two separate masses in the right upper lobe and left temporal lobe ring-enhancing intracranial lesion.  EXAM: CT GUIDED CORE BIOPSY OF RIGHT UPPER LOBE LUNG MASS  ANESTHESIA/SEDATION: 2.0  Mg IV Versed; 150 mcg IV Fentanyl  Total Moderate Sedation Time: 30 minutes.  PROCEDURE: The procedure risks, benefits, and alternatives were explained to the patient. Questions regarding the procedure were encouraged and answered. The patient understands and consents to the procedure.  The right lateral chest wall was prepped with Betadine in a sterile fashion, and a sterile drape was applied covering the operative field. A sterile gown and sterile gloves were used for the procedure. Local anesthesia was provided with 1% Lidocaine.  CT was performed in a supine position with the right arm elevated above the patient's head. After localizing a site for percutaneous skin entry by CT, the skin was marked.  Utilizing CT fluoroscopic guidance, a 17 gauge needle was advanced from a a lateral approach into the right upper lobe. Coaxial core biopsy was performed with an 18 gauge device. A total of 2 core biopsy samples were obtained and placed in formalin.  The Biosentry device was used to place a plug at the pleural  entry site upon retraction of the outer needle. Additional CT imaging was performed.  COMPLICATIONS: Tiny right pneumothorax after biopsy. SIR level A: No therapy, no consequence.  FINDINGS: Two adjacent right upper lobe lung lesions are again demonstrated. The more lateral of these 2 lesions measures approximately 1.9 cm. Solid tissue was obtained. The biopsy was complicated by a tiny pneumothorax adjacent to the lesion. The patient was asymptomatic following biopsy. This will be followed by chest x-ray.  IMPRESSION: CT-guided core biopsy of right upper lobe lung lesion. A tiny adjacent right pneumothorax was present following the biopsy. This required no immediate treatment and will be followed by chest x-ray.   Electronically Signed   By: Aletta Edouard M.D.   On: 06/14/2014 12:43    ASSESSMENT AND PLAN: This is a very pleasant 44 years old African-American female recently diagnosed with stage IV (T3, N2, M1b) non-small cell lung cancer, adenocarcinoma with negative EGFR mutation and negative ALK gene translocation diagnosed in April 2016 and presenting with 2 pulmonary nodules in  the right upper lobe as well as mediastinal lymphadenopathy and solitary brain lesion. The patient underwent stereotactic radiotherapy to the solitary brain lesion. I had a lengthy discussion with the patient and her family today about her current disease stage, prognosis and treatment options. I explained to the patient that she has incurable condition and on the treatment will be off palliative nature. I gave the patient the option of palliative care and hospice referral versus consideration of palliative therapy. We will consider her for a course of concurrent chemoradiation for the locally advanced disease in the chest since she received treatment to her brain lesion. She will then be treated with consolidation course of chemotherapy after completion of the course of initial concurrent chemoradiation. I discussed with the  patient her treatment in details and her chemotherapy will be in the form of weekly carboplatin for AUC of 2 and paclitaxel 45 MG/M2. I discussed with the patient adverse effect of the chemotherapy including but not limited to alopecia, myelosuppression, nausea and vomiting, peripheral neuropathy, liver or renal dysfunction. She is expected to start the first cycle of this treatment next week. She would come back for follow-up visit in 2 weeks for reevaluation and management any adverse effect of her treatment. For depression, I started the patient on Remeron 30 mg by mouth daily at bedtime For pain management, the patient was given a refill of Norco 5/325 mg by mouth as needed for pain. I also Escribed Compazine 10 mg by mouth every 6 hours as needed for nausea. The patient signed today a contract for the condition of treatment and she was given a copy of this contract. She was advised to call immediately if she has any concerning symptoms in the interval.  The patient voices understanding of current disease status and treatment options and is in agreement with the current care plan.  All questions were answered. The patient knows to call the clinic with any problems, questions or concerns. We can certainly see the patient much sooner if necessary.  I spent 35 minutes counseling the patient face to face. The total time spent in the appointment was 50 minutes.  Disclaimer: This note was dictated with voice recognition software. Similar sounding words can inadvertently be transcribed and may not be corrected upon review.

## 2014-07-13 NOTE — Telephone Encounter (Signed)
per pof to sch pt appt-gave pt copy of sch-sent MM email to adv of no openings on 5/23-will sch after reply-pt aware

## 2014-07-14 ENCOUNTER — Other Ambulatory Visit: Payer: Medicaid Other

## 2014-07-14 ENCOUNTER — Ambulatory Visit
Admission: RE | Admit: 2014-07-14 | Discharge: 2014-07-14 | Disposition: A | Discharge status: Home / Self Care | Payer: Medicaid Other | Source: Ambulatory Visit | Attending: Radiation Oncology | Admitting: Radiation Oncology

## 2014-07-14 DIAGNOSIS — C3411 Malignant neoplasm of upper lobe, right bronchus or lung: Secondary | ICD-10-CM | POA: Diagnosis not present

## 2014-07-17 ENCOUNTER — Other Ambulatory Visit: Payer: Self-pay | Admitting: *Deleted

## 2014-07-17 ENCOUNTER — Ambulatory Visit
Admission: RE | Admit: 2014-07-17 | Discharge: 2014-07-17 | Disposition: A | Discharge status: Home / Self Care | Payer: Medicaid Other | Source: Ambulatory Visit | Attending: Radiation Oncology | Admitting: Radiation Oncology

## 2014-07-17 ENCOUNTER — Other Ambulatory Visit (HOSPITAL_BASED_OUTPATIENT_CLINIC_OR_DEPARTMENT_OTHER): Payer: Medicaid Other

## 2014-07-17 DIAGNOSIS — C3411 Malignant neoplasm of upper lobe, right bronchus or lung: Secondary | ICD-10-CM | POA: Diagnosis not present

## 2014-07-17 LAB — COMPREHENSIVE METABOLIC PANEL (CC13)
ALT: 82 U/L — AB (ref 0–55)
ANION GAP: 9 meq/L (ref 3–11)
AST: 22 U/L (ref 5–34)
Albumin: 3.4 g/dL — ABNORMAL LOW (ref 3.5–5.0)
Alkaline Phosphatase: 88 U/L (ref 40–150)
BUN: 16.7 mg/dL (ref 7.0–26.0)
CALCIUM: 8.9 mg/dL (ref 8.4–10.4)
CHLORIDE: 109 meq/L (ref 98–109)
CO2: 22 mEq/L (ref 22–29)
Creatinine: 0.8 mg/dL (ref 0.6–1.1)
EGFR: >90 mL/min/{1.73_m2} (ref >90)
Glucose: 85 mg/dl (ref 70–140)
POTASSIUM: 3.9 meq/L (ref 3.5–5.1)
SODIUM: 141 meq/L (ref 136–145)
TOTAL PROTEIN: 6.7 g/dL (ref 6.4–8.3)
Total Bilirubin: 0.37 mg/dL (ref 0.20–1.20)

## 2014-07-17 LAB — CBC WITH DIFFERENTIAL/PLATELET
BASO%: 0 % (ref 0.0–2.0)
Basophils Absolute: 0 10*3/uL (ref 0.0–0.1)
EOS%: 0.4 % (ref 0.0–7.0)
Eosinophils Absolute: 0 10*3/uL (ref 0.0–0.5)
HCT: 34.9 % (ref 34.8–46.6)
HGB: 12 g/dL (ref 11.6–15.9)
LYMPH%: 19.9 % (ref 14.0–49.7)
MCH: 30.4 pg (ref 25.1–34.0)
MCHC: 34.4 g/dL (ref 31.5–36.0)
MCV: 88.4 fL (ref 79.5–101.0)
MONO#: 1.3 10*3/uL — ABNORMAL HIGH (ref 0.1–0.9)
MONO%: 13.3 % (ref 0.0–14.0)
NEUT#: 6.6 10*3/uL — ABNORMAL HIGH (ref 1.5–6.5)
NEUT%: 66.4 % (ref 38.4–76.8)
Platelets: 209 10*3/uL (ref 145–400)
RBC: 3.95 10*6/uL (ref 3.70–5.45)
RDW: 14.5 % (ref 11.2–14.5)
WBC: 9.9 10*3/uL (ref 3.9–10.3)
lymph#: 2 10*3/uL (ref 0.9–3.3)

## 2014-07-18 ENCOUNTER — Ambulatory Visit
Admission: RE | Admit: 2014-07-18 | Discharge: 2014-07-18 | Disposition: A | Discharge status: Home / Self Care | Payer: Medicaid Other | Source: Ambulatory Visit | Attending: Radiation Oncology | Admitting: Radiation Oncology

## 2014-07-18 ENCOUNTER — Ambulatory Visit (HOSPITAL_BASED_OUTPATIENT_CLINIC_OR_DEPARTMENT_OTHER): Payer: Medicaid Other

## 2014-07-18 VITALS — BP 127/62 | HR 82 | Temp 98.4°F | Resp 18

## 2014-07-18 DIAGNOSIS — C3411 Malignant neoplasm of upper lobe, right bronchus or lung: Secondary | ICD-10-CM | POA: Diagnosis not present

## 2014-07-18 DIAGNOSIS — Z5111 Encounter for antineoplastic chemotherapy: Secondary | ICD-10-CM

## 2014-07-18 MED ORDER — FAMOTIDINE IN NACL 20-0.9 MG/50ML-% IV SOLN
INTRAVENOUS | Status: AC
  Filled 2014-07-18: qty 50

## 2014-07-18 MED ORDER — SODIUM CHLORIDE 0.9 % IV SOLN
Freq: Once | INTRAVENOUS | Status: AC
  Administered 2014-07-18: 08:00:00 via INTRAVENOUS

## 2014-07-18 MED ORDER — FAMOTIDINE IN NACL 20-0.9 MG/50ML-% IV SOLN
20.0000 mg | Freq: Once | INTRAVENOUS | Status: AC
  Administered 2014-07-18: 20 mg via INTRAVENOUS

## 2014-07-18 MED ORDER — DIPHENHYDRAMINE HCL 50 MG/ML IJ SOLN
INTRAMUSCULAR | Status: AC
  Filled 2014-07-18: qty 1

## 2014-07-18 MED ORDER — SODIUM CHLORIDE 0.9 % IV SOLN
Freq: Once | INTRAVENOUS | Status: AC
  Administered 2014-07-18: 09:00:00 via INTRAVENOUS
  Filled 2014-07-18: qty 8

## 2014-07-18 MED ORDER — SODIUM CHLORIDE 0.9 % IV SOLN
283.6000 mg | Freq: Once | INTRAVENOUS | Status: AC
  Administered 2014-07-18: 280 mg via INTRAVENOUS
  Filled 2014-07-18: qty 28

## 2014-07-18 MED ORDER — DIPHENHYDRAMINE HCL 50 MG/ML IJ SOLN
50.0000 mg | Freq: Once | INTRAMUSCULAR | Status: AC
  Administered 2014-07-18: 50 mg via INTRAVENOUS

## 2014-07-18 MED ORDER — PACLITAXEL CHEMO INJECTION 300 MG/50ML
45.0000 mg/m2 | Freq: Once | INTRAVENOUS | Status: AC
  Administered 2014-07-18: 90 mg via INTRAVENOUS
  Filled 2014-07-18: qty 15

## 2014-07-18 NOTE — Progress Notes (Signed)
Per Montel Clock, pharmacist, okay to treat today with ALT of 82.

## 2014-07-18 NOTE — Patient Instructions (Signed)
[facility] Discharge Instructions for Patients Receiving Chemotherapy  Today you received the following chemotherapy agents Taxol and carboplatin  To help prevent nausea and vomiting after your treatment, we encourage you to take your nausea medication; Compazine '10mg'$  every six hours as needed.    If you develop nausea and vomiting that is not controlled by your nausea medication, call the clinic.   BELOW ARE SYMPTOMS THAT SHOULD BE REPORTED IMMEDIATELY:  *FEVER GREATER THAN 100.5 F  *CHILLS WITH OR WITHOUT FEVER  NAUSEA AND VOMITING THAT IS NOT CONTROLLED WITH YOUR NAUSEA MEDICATION  *UNUSUAL SHORTNESS OF BREATH  *UNUSUAL BRUISING OR BLEEDING  TENDERNESS IN MOUTH AND THROAT WITH OR WITHOUT PRESENCE OF ULCERS  *URINARY PROBLEMS  *BOWEL PROBLEMS  UNUSUAL RASH Items with * indicate a potential emergency and should be followed up as soon as possible.  Feel free to call the clinic you have any questions or concerns. The clinic phone number is [phone].  Please show the [facility] at check-in to the Emergency Department and triage nurse.

## 2014-07-19 ENCOUNTER — Ambulatory Visit
Admission: RE | Admit: 2014-07-19 | Discharge: 2014-07-19 | Disposition: A | Discharge status: Home / Self Care | Payer: Medicaid Other | Source: Ambulatory Visit | Attending: Radiation Oncology | Admitting: Radiation Oncology

## 2014-07-19 DIAGNOSIS — C3411 Malignant neoplasm of upper lobe, right bronchus or lung: Secondary | ICD-10-CM | POA: Diagnosis not present

## 2014-07-20 ENCOUNTER — Telehealth: Payer: Self-pay | Admitting: *Deleted

## 2014-07-20 ENCOUNTER — Ambulatory Visit
Admission: RE | Admit: 2014-07-20 | Discharge: 2014-07-20 | Disposition: A | Discharge status: Home / Self Care | Payer: Medicaid Other | Source: Ambulatory Visit | Attending: Radiation Oncology | Admitting: Radiation Oncology

## 2014-07-20 DIAGNOSIS — C3411 Malignant neoplasm of upper lobe, right bronchus or lung: Secondary | ICD-10-CM | POA: Diagnosis not present

## 2014-07-20 NOTE — Telephone Encounter (Signed)
-----   Message from Egbert Garibaldi, RN sent at 07/18/2014 11:44 AM EDT ----- Regarding: mm chemo followup call Patient of Dr. Earlie Server. First timeTaxol/Carboplatin. Pt tolerated well with no reactions noted.

## 2014-07-20 NOTE — Telephone Encounter (Signed)
Called pt, unable to reach lmovm for pt to call clinic with any calls or concerns.

## 2014-07-21 ENCOUNTER — Ambulatory Visit
Admission: RE | Admit: 2014-07-21 | Discharge: 2014-07-21 | Disposition: A | Discharge status: Home / Self Care | Payer: Medicaid Other | Source: Ambulatory Visit | Attending: Radiation Oncology | Admitting: Radiation Oncology

## 2014-07-21 ENCOUNTER — Telehealth: Payer: Self-pay | Admitting: *Deleted

## 2014-07-21 ENCOUNTER — Encounter: Payer: Self-pay | Admitting: Radiation Oncology

## 2014-07-21 VITALS — BP 123/88 | HR 97 | Resp 16 | Wt 187.6 lb

## 2014-07-21 DIAGNOSIS — R918 Other nonspecific abnormal finding of lung field: Secondary | ICD-10-CM | POA: Diagnosis not present

## 2014-07-21 DIAGNOSIS — C7931 Secondary malignant neoplasm of brain: Secondary | ICD-10-CM | POA: Diagnosis not present

## 2014-07-21 DIAGNOSIS — C3411 Malignant neoplasm of upper lobe, right bronchus or lung: Secondary | ICD-10-CM | POA: Diagnosis not present

## 2014-07-21 MED ORDER — RADIAPLEXRX EX GEL
Freq: Once | CUTANEOUS | Status: AC
  Administered 2014-07-21: 16:00:00 via TOPICAL

## 2014-07-21 NOTE — Progress Notes (Signed)
Weight and vitals stable. Denies pain. Denies cough or SOB. Reports mild fatigue. Reports occasional nausea associated with side effects of chemotherapy. Last chemo treatment was Tuesday of this week with the next on Monday. Radiaplex given. Patient directed upon use of radiaplex and verbalized understanding.

## 2014-07-21 NOTE — Progress Notes (Signed)
Department of Radiation Oncology  Phone:  [phone] Fax:        [phone]  Weekly Treatment Note    Name: [name] Date: 07/21/2014 MRN: [id number] DOB: 1970/06/30   Current dose: 10 Gy  Current fraction: 5   MEDICATIONS: Current Outpatient Prescriptions  Medication Sig Dispense Refill  . HYDROcodone-acetaminophen (NORCO/VICODIN) 5-325 MG per tablet Take 1 tablet by mouth every 4 (four) hours as needed for moderate pain. 30 tablet 0  . lisinopril-hydrochlorothiazide (PRINZIDE,ZESTORETIC) 10-12.5 MG per tablet Take 1 tablet by mouth daily.    [name] LORazepam (ATIVAN) 1 MG tablet Take 1 mg by mouth every 8 (eight) hours as needed for anxiety.   0  . mirtazapine (REMERON) 30 MG tablet Take 1 tablet (30 mg total) by mouth at bedtime. 30 tablet 2  . omeprazole (PRILOSEC) 20 MG capsule Take 20 mg by mouth daily as needed (for heartburn).    . prochlorperazine (COMPAZINE) 10 MG tablet Take 1 tablet (10 mg total) by mouth every 6 (six) hours as needed for nausea or vomiting. 30 tablet 0  . dexamethasone (DECADRON) 1 MG tablet Take 2 mg by mouth 2 (two) times daily.  0  . dexamethasone (DECADRON) 4 MG tablet Take 1 tablet (4 mg total) by mouth as directed. Dexamethasone Taper:  Take 4 mg morning, noon, and night for 5 days, then, 4 mg morning and night for 10  days then 2 mg morning and night for 10 days, the 2 mg once daily for 10 days, then stop (Patient not taking: Reported on 07/21/2014) 30 tablet 1  . fluconazole (DIFLUCAN) 100 MG tablet Take 1 tablet (100 mg total) by mouth daily. (Patient not taking: Reported on 07/21/2014) 10 tablet 0  . nystatin (MYCOSTATIN) 100000 UNIT/ML suspension Take 5 mLs (500,000 Units total) by mouth 4 (four) times daily. (Patient not taking: Reported on 07/21/2014) 60 mL 1   No current facility-administered medications for this encounter.     ALLERGIES: Review of patient's allergies indicates no known allergies.   LABORATORY DATA:  Lab Results   Component Value Date   WBC 9.9 07/17/2014   HGB 12.0 07/17/2014   HCT 34.9 07/17/2014   MCV 88.4 07/17/2014   PLT 209 07/17/2014   Lab Results  Component Value Date   NA 141 07/17/2014   K 3.9 07/17/2014   CL 100 07/06/2014   CO2 22 07/17/2014   Lab Results  Component Value Date   ALT 82* 07/17/2014   AST 22 07/17/2014   ALKPHOS 88 07/17/2014   BILITOT 0.37 07/17/2014     NARRATIVE: Destiny [name] was seen today for weekly treatment management. The chart was checked and the patient's films were reviewed.  Weight and vitals stable. Denies pain. Denies cough or SOB. Reports mild fatigue. Reports occasional nausea associated with side effects of chemotherapy. Last chemo treatment was Tuesday of this week with the next on Monday. Radiaplex given. Patient directed upon use of radiaplex and verbalized understanding. Denies difficulty swallowing.   PHYSICAL EXAMINATION: weight is 187 lb 9.6 oz (85.095 kg). Her blood pressure is 123/88 and her pulse is 97. Her respiration is 16.        ASSESSMENT: The patient is doing satisfactorily with treatment.  PLAN: We will continue with the patient's radiation treatment as planned.        This document serves as a record of services personally performed by Kyung Rudd, MD. It was created on his behalf by Derek Mound, a  trained medical scribe. The creation of this record is based on the scribe's personal observations and the provider's statements to them. This document has been checked and approved by the attending provider.

## 2014-07-21 NOTE — Telephone Encounter (Signed)
TC from pt requesting a bedside commode. She lives in a 2 story apartment but there is no bathroom downstairs and going up the 14 steps to get upstairs to use the bathroom is very difficult for her.

## 2014-07-22 NOTE — Telephone Encounter (Signed)
OK to order it.

## 2014-07-24 ENCOUNTER — Other Ambulatory Visit: Payer: Self-pay | Admitting: Medical Oncology

## 2014-07-24 ENCOUNTER — Ambulatory Visit (HOSPITAL_BASED_OUTPATIENT_CLINIC_OR_DEPARTMENT_OTHER): Payer: Medicaid Other

## 2014-07-24 ENCOUNTER — Telehealth: Payer: Self-pay | Admitting: *Deleted

## 2014-07-24 ENCOUNTER — Other Ambulatory Visit: Payer: Self-pay | Admitting: *Deleted

## 2014-07-24 ENCOUNTER — Other Ambulatory Visit (HOSPITAL_BASED_OUTPATIENT_CLINIC_OR_DEPARTMENT_OTHER): Payer: Medicaid Other

## 2014-07-24 ENCOUNTER — Ambulatory Visit
Admission: RE | Admit: 2014-07-24 | Discharge: 2014-07-24 | Disposition: A | Discharge status: Home / Self Care | Payer: Medicaid Other | Source: Ambulatory Visit | Attending: Radiation Oncology | Admitting: Radiation Oncology

## 2014-07-24 VITALS — BP 120/62 | HR 94 | Temp 98.2°F | Resp 18

## 2014-07-24 DIAGNOSIS — C3411 Malignant neoplasm of upper lobe, right bronchus or lung: Secondary | ICD-10-CM

## 2014-07-24 DIAGNOSIS — Z5111 Encounter for antineoplastic chemotherapy: Secondary | ICD-10-CM | POA: Diagnosis not present

## 2014-07-24 DIAGNOSIS — R531 Weakness: Secondary | ICD-10-CM

## 2014-07-24 LAB — CBC WITH DIFFERENTIAL/PLATELET
BASO%: 0.1 % (ref 0.0–2.0)
Basophils Absolute: 0 10*3/uL (ref 0.0–0.1)
EOS%: 0.1 % (ref 0.0–7.0)
Eosinophils Absolute: 0 10*3/uL (ref 0.0–0.5)
HCT: 36.8 % (ref 34.8–46.6)
HGB: 12.5 g/dL (ref 11.6–15.9)
LYMPH#: 1.4 10*3/uL (ref 0.9–3.3)
LYMPH%: 16.5 % (ref 14.0–49.7)
MCH: 30.4 pg (ref 25.1–34.0)
MCHC: 34 g/dL (ref 31.5–36.0)
MCV: 89.5 fL (ref 79.5–101.0)
MONO#: 0.8 10*3/uL (ref 0.1–0.9)
MONO%: 9.2 % (ref 0.0–14.0)
NEUT#: 6.3 10*3/uL (ref 1.5–6.5)
NEUT%: 74.1 % (ref 38.4–76.8)
Platelets: 235 10*3/uL (ref 145–400)
RBC: 4.11 10*6/uL (ref 3.70–5.45)
RDW: 14.9 % — AB (ref 11.2–14.5)
WBC: 8.6 10*3/uL (ref 3.9–10.3)

## 2014-07-24 LAB — COMPREHENSIVE METABOLIC PANEL (CC13)
ALK PHOS: 96 U/L (ref 40–150)
ALT: 39 U/L (ref 0–55)
ANION GAP: 11 meq/L (ref 3–11)
AST: 19 U/L (ref 5–34)
Albumin: 3.8 g/dL (ref 3.5–5.0)
BUN: 18.5 mg/dL (ref 7.0–26.0)
CO2: 21 meq/L — AB (ref 22–29)
CREATININE: 0.7 mg/dL (ref 0.6–1.1)
Calcium: 9.5 mg/dL (ref 8.4–10.4)
Chloride: 103 mEq/L (ref 98–109)
GLUCOSE: 92 mg/dL (ref 70–140)
Potassium: 4.3 mEq/L (ref 3.5–5.1)
Sodium: 135 mEq/L — ABNORMAL LOW (ref 136–145)
Total Bilirubin: 0.22 mg/dL (ref 0.20–1.20)
Total Protein: 7.2 g/dL (ref 6.4–8.3)

## 2014-07-24 MED ORDER — SODIUM CHLORIDE 0.9 % IV SOLN
283.6000 mg | Freq: Once | INTRAVENOUS | Status: AC
  Administered 2014-07-24: 280 mg via INTRAVENOUS
  Filled 2014-07-24: qty 28

## 2014-07-24 MED ORDER — FAMOTIDINE IN NACL 20-0.9 MG/50ML-% IV SOLN
20.0000 mg | Freq: Once | INTRAVENOUS | Status: AC
  Administered 2014-07-24: 20 mg via INTRAVENOUS

## 2014-07-24 MED ORDER — PACLITAXEL CHEMO INJECTION 300 MG/50ML
45.0000 mg/m2 | Freq: Once | INTRAVENOUS | Status: AC
  Administered 2014-07-24: 90 mg via INTRAVENOUS
  Filled 2014-07-24: qty 15

## 2014-07-24 MED ORDER — FAMOTIDINE IN NACL 20-0.9 MG/50ML-% IV SOLN
INTRAVENOUS | Status: AC
  Filled 2014-07-24: qty 50

## 2014-07-24 MED ORDER — DEXAMETHASONE SODIUM PHOSPHATE 100 MG/10ML IJ SOLN
Freq: Once | INTRAMUSCULAR | Status: AC
  Administered 2014-07-24: 11:00:00 via INTRAVENOUS
  Filled 2014-07-24: qty 8

## 2014-07-24 MED ORDER — DIPHENHYDRAMINE HCL 50 MG/ML IJ SOLN
50.0000 mg | Freq: Once | INTRAMUSCULAR | Status: AC
  Administered 2014-07-24: 50 mg via INTRAVENOUS

## 2014-07-24 MED ORDER — NICOTINE 14 MG/24HR TD PT24: 14.0000 mg | MEDICATED_PATCH | Freq: Every day | TRANSDERMAL | Status: DC

## 2014-07-24 MED ORDER — SODIUM CHLORIDE 0.9 % IV SOLN
Freq: Once | INTRAVENOUS | Status: AC
  Administered 2014-07-24: 11:00:00 via INTRAVENOUS

## 2014-07-24 MED ORDER — DIPHENHYDRAMINE HCL 50 MG/ML IJ SOLN
INTRAMUSCULAR | Status: AC
  Filled 2014-07-24: qty 1

## 2014-07-24 MED ORDER — NICOTINE 7 MG/24HR TD PT24: 7.0000 mg | MEDICATED_PATCH | Freq: Every day | TRANSDERMAL | Status: DC

## 2014-07-24 NOTE — Telephone Encounter (Signed)
Called in nicoderm step 2 and 3 to pt pharmacy

## 2014-07-24 NOTE — Patient Instructions (Signed)
[facility] Discharge Instructions for Patients Receiving Chemotherapy  Today you received the following chemotherapy agents Paclitaxel/Carboplatin.  To help prevent nausea and vomiting after your treatment, we encourage you to take your nausea medication as directed.    If you develop nausea and vomiting that is not controlled by your nausea medication, call the clinic.   BELOW ARE SYMPTOMS THAT SHOULD BE REPORTED IMMEDIATELY:  *FEVER GREATER THAN 100.5 F  *CHILLS WITH OR WITHOUT FEVER  NAUSEA AND VOMITING THAT IS NOT CONTROLLED WITH YOUR NAUSEA MEDICATION  *UNUSUAL SHORTNESS OF BREATH  *UNUSUAL BRUISING OR BLEEDING  TENDERNESS IN MOUTH AND THROAT WITH OR WITHOUT PRESENCE OF ULCERS  *URINARY PROBLEMS  *BOWEL PROBLEMS  UNUSUAL RASH Items with * indicate a potential emergency and should be followed up as soon as possible.  Feel free to call the clinic you have any questions or concerns. The clinic phone number is [phone].  Please show the [place] at check-in to the Emergency Department and triage nurse.

## 2014-07-24 NOTE — Telephone Encounter (Signed)
Ordered BSC from [facility] this morning 07/24/14

## 2014-07-24 NOTE — Telephone Encounter (Signed)
TC to patient to let her know that the [facility] has been ordered. She verbalized understanding.  Pt. Also requested a refill on her nicoderm patches as boxes 2 & 3 fell into a mop bucket of water.

## 2014-07-24 NOTE — Telephone Encounter (Signed)
TC from [place] @ [facility]. She needs pt's demographics, insurance information and pt's recent height and weight fax'd to them @ [phone] attn: [name]

## 2014-07-25 ENCOUNTER — Other Ambulatory Visit: Payer: Self-pay | Admitting: Radiation Therapy

## 2014-07-25 ENCOUNTER — Ambulatory Visit
Admission: RE | Admit: 2014-07-25 | Discharge: 2014-07-25 | Disposition: A | Discharge status: Home / Self Care | Payer: Medicaid Other | Source: Ambulatory Visit | Attending: Radiation Oncology | Admitting: Radiation Oncology

## 2014-07-25 DIAGNOSIS — C7931 Secondary malignant neoplasm of brain: Secondary | ICD-10-CM

## 2014-07-25 DIAGNOSIS — C3411 Malignant neoplasm of upper lobe, right bronchus or lung: Secondary | ICD-10-CM | POA: Diagnosis not present

## 2014-07-26 ENCOUNTER — Ambulatory Visit
Admission: RE | Admit: 2014-07-26 | Discharge: 2014-07-26 | Disposition: A | Discharge status: Home / Self Care | Payer: Medicaid Other | Source: Ambulatory Visit | Attending: Radiation Oncology | Admitting: Radiation Oncology

## 2014-07-26 DIAGNOSIS — C3411 Malignant neoplasm of upper lobe, right bronchus or lung: Secondary | ICD-10-CM | POA: Diagnosis not present

## 2014-07-27 ENCOUNTER — Ambulatory Visit
Admission: RE | Admit: 2014-07-27 | Discharge: 2014-07-27 | Disposition: A | Discharge status: Home / Self Care | Payer: Medicaid Other | Source: Ambulatory Visit | Attending: Radiation Oncology | Admitting: Radiation Oncology

## 2014-07-27 ENCOUNTER — Encounter: Payer: Self-pay | Admitting: Radiation Oncology

## 2014-07-27 VITALS — BP 120/75 | HR 115 | Resp 16 | Wt 200.5 lb

## 2014-07-27 DIAGNOSIS — C3411 Malignant neoplasm of upper lobe, right bronchus or lung: Secondary | ICD-10-CM | POA: Diagnosis not present

## 2014-07-27 NOTE — Progress Notes (Signed)
  Radiation Oncology         [phone] ________________________________  Name: Destiny [name] MRN: [id number]  Date: 07/27/2014  DOB: 08-Jan-1971    Weekly Radiation Therapy Management    ICD-9-CM ICD-10-CM   1. Primary cancer of right upper lobe of lung 162.3 C34.11     Current Dose: 18 Gy     Planned Dose:  66 Gy  Narrative . . . . . . . . The patient presents for routine under treatment assessment.                                   Weight and vitals stable. Denies pain. Reports taking Decadron 1 mg bid. Denies headache, dizziness or vomiting. Reports nausea. Denies cough. Reports SOB with exertion. Denies difficulty swallowing. Reports her husband is concerned she is slurring her speech and dropping things often. Also, patient reports intermittent low back pain when standing for long periods.  She receives chemotherapy once a week with Dr. Julien Nordmann.            Blood work was reviewed.                                 Set-up films were reviewed.                                 The chart was checked. Physical Findings. . .  weight is 200 lb 8 oz (90.946 kg). Her blood pressure is 120/75 and her pulse is 115. Her respiration is 16. . Weight essentially stable.  No significant changes. Impression . . . . . . . The patient is tolerating radiation. Plan . . . . . . . . . . . . Continue treatment as planned.  This document serves as a record of services personally performed by Tyler Pita, MD. It was created on his behalf by Arlyce Harman, a trained medical scribe. The creation of this record is based on the scribe's personal observations and the provider's statements to them. This document has been checked and approved by the attending provider.     ________________________________  Sheral Apley. Tammi Klippel, M.D.

## 2014-07-27 NOTE — Progress Notes (Signed)
Weight and vitals stable. Denies pain. Reports taking Decadron 1 mg bid. Denies headache, dizziness or vomiting. Reports nausea. Denies cough. Reports SOB with exertion. Denies difficulty swallowing. Reports her husband is concerned she is slurring her speech and dropping things often. Also, patient reports intermittent low back pain when standing for long periods.

## 2014-07-28 ENCOUNTER — Ambulatory Visit
Admission: RE | Admit: 2014-07-28 | Discharge: 2014-07-28 | Disposition: A | Discharge status: Home / Self Care | Payer: Medicaid Other | Source: Ambulatory Visit | Attending: Radiation Oncology | Admitting: Radiation Oncology

## 2014-07-28 DIAGNOSIS — C3411 Malignant neoplasm of upper lobe, right bronchus or lung: Secondary | ICD-10-CM | POA: Diagnosis not present

## 2014-07-31 ENCOUNTER — Encounter: Payer: Self-pay | Admitting: Internal Medicine

## 2014-07-31 ENCOUNTER — Telehealth: Payer: Self-pay | Admitting: Internal Medicine

## 2014-07-31 ENCOUNTER — Other Ambulatory Visit (HOSPITAL_BASED_OUTPATIENT_CLINIC_OR_DEPARTMENT_OTHER): Payer: Medicaid Other

## 2014-07-31 ENCOUNTER — Encounter: Payer: Self-pay | Admitting: *Deleted

## 2014-07-31 ENCOUNTER — Ambulatory Visit (HOSPITAL_BASED_OUTPATIENT_CLINIC_OR_DEPARTMENT_OTHER): Payer: Medicaid Other

## 2014-07-31 ENCOUNTER — Other Ambulatory Visit: Payer: Self-pay | Admitting: *Deleted

## 2014-07-31 ENCOUNTER — Ambulatory Visit (HOSPITAL_BASED_OUTPATIENT_CLINIC_OR_DEPARTMENT_OTHER): Payer: Medicaid Other | Admitting: Internal Medicine

## 2014-07-31 ENCOUNTER — Ambulatory Visit
Admission: RE | Admit: 2014-07-31 | Discharge: 2014-07-31 | Disposition: A | Discharge status: Home / Self Care | Payer: Medicaid Other | Source: Ambulatory Visit | Attending: Radiation Oncology | Admitting: Radiation Oncology

## 2014-07-31 VITALS — BP 128/75 | HR 106 | Temp 98.3°F | Resp 17 | Ht 67.0 in | Wt 202.5 lb

## 2014-07-31 DIAGNOSIS — C7931 Secondary malignant neoplasm of brain: Secondary | ICD-10-CM | POA: Diagnosis not present

## 2014-07-31 DIAGNOSIS — Z5111 Encounter for antineoplastic chemotherapy: Secondary | ICD-10-CM | POA: Diagnosis not present

## 2014-07-31 DIAGNOSIS — C3411 Malignant neoplasm of upper lobe, right bronchus or lung: Secondary | ICD-10-CM | POA: Diagnosis not present

## 2014-07-31 LAB — CBC WITH DIFFERENTIAL/PLATELET
BASO%: 0.5 % (ref 0.0–2.0)
Basophils Absolute: 0 10*3/uL (ref 0.0–0.1)
EOS%: 0.2 % (ref 0.0–7.0)
Eosinophils Absolute: 0 10*3/uL (ref 0.0–0.5)
HCT: 40 % (ref 34.8–46.6)
HEMOGLOBIN: 13.5 g/dL (ref 11.6–15.9)
LYMPH%: 16.4 % (ref 14.0–49.7)
MCH: 31 pg (ref 25.1–34.0)
MCHC: 33.7 g/dL (ref 31.5–36.0)
MCV: 92.1 fL (ref 79.5–101.0)
MONO#: 0.9 10*3/uL (ref 0.1–0.9)
MONO%: 9.7 % (ref 0.0–14.0)
NEUT%: 73.2 % (ref 38.4–76.8)
NEUTROS ABS: 7 10*3/uL — AB (ref 1.5–6.5)
Platelets: 343 10*3/uL (ref 145–400)
RBC: 4.35 10*6/uL (ref 3.70–5.45)
RDW: 15.4 % — AB (ref 11.2–14.5)
WBC: 9.5 10*3/uL (ref 3.9–10.3)
lymph#: 1.6 10*3/uL (ref 0.9–3.3)

## 2014-07-31 LAB — COMPREHENSIVE METABOLIC PANEL (CC13)
ALT: 44 U/L (ref 0–55)
AST: 25 U/L (ref 5–34)
Albumin: 3.9 g/dL (ref 3.5–5.0)
Alkaline Phosphatase: 98 U/L (ref 40–150)
Anion Gap: 14 mEq/L — ABNORMAL HIGH (ref 3–11)
BUN: 16.2 mg/dL (ref 7.0–26.0)
CHLORIDE: 105 meq/L (ref 98–109)
CO2: 20 mEq/L — ABNORMAL LOW (ref 22–29)
CREATININE: 0.7 mg/dL (ref 0.6–1.1)
Calcium: 9.5 mg/dL (ref 8.4–10.4)
Glucose: 88 mg/dl (ref 70–140)
Potassium: 4.1 mEq/L (ref 3.5–5.1)
Sodium: 139 mEq/L (ref 136–145)
Total Bilirubin: 0.21 mg/dL (ref 0.20–1.20)
Total Protein: 7.9 g/dL (ref 6.4–8.3)

## 2014-07-31 MED ORDER — SODIUM CHLORIDE 0.9 % IV SOLN
283.6000 mg | Freq: Once | INTRAVENOUS | Status: AC
  Administered 2014-07-31: 280 mg via INTRAVENOUS
  Filled 2014-07-31: qty 28

## 2014-07-31 MED ORDER — SODIUM CHLORIDE 0.9 % IV SOLN
Freq: Once | INTRAVENOUS | Status: AC
  Administered 2014-07-31: 11:00:00 via INTRAVENOUS

## 2014-07-31 MED ORDER — DIPHENHYDRAMINE HCL 50 MG/ML IJ SOLN
INTRAMUSCULAR | Status: AC
  Filled 2014-07-31: qty 1

## 2014-07-31 MED ORDER — FAMOTIDINE IN NACL 20-0.9 MG/50ML-% IV SOLN
INTRAVENOUS | Status: AC
  Filled 2014-07-31: qty 50

## 2014-07-31 MED ORDER — DIPHENHYDRAMINE HCL 50 MG/ML IJ SOLN
50.0000 mg | Freq: Once | INTRAMUSCULAR | Status: AC
  Administered 2014-07-31: 50 mg via INTRAVENOUS

## 2014-07-31 MED ORDER — DEXAMETHASONE SODIUM PHOSPHATE 100 MG/10ML IJ SOLN
Freq: Once | INTRAMUSCULAR | Status: AC
  Administered 2014-07-31: 11:00:00 via INTRAVENOUS
  Filled 2014-07-31: qty 8

## 2014-07-31 MED ORDER — PACLITAXEL CHEMO INJECTION 300 MG/50ML
45.0000 mg/m2 | Freq: Once | INTRAVENOUS | Status: AC
  Administered 2014-07-31: 90 mg via INTRAVENOUS
  Filled 2014-07-31: qty 15

## 2014-07-31 MED ORDER — FAMOTIDINE IN NACL 20-0.9 MG/50ML-% IV SOLN
20.0000 mg | Freq: Once | INTRAVENOUS | Status: AC
  Administered 2014-07-31: 20 mg via INTRAVENOUS

## 2014-07-31 NOTE — Progress Notes (Signed)
Oncology Nurse Navigator Documentation  Oncology Nurse Navigator Flowsheets 07/31/2014  Navigator Encounter Type Treatment  Patient Visit Type Follow-up  Treatment Phase Treatment  Barriers/Navigation Needs Education.  Information booklet on cancer pain given and explained.  She was thankful for the information.   Education Pain/ Symptom Management  Interventions Education Method  Education Method Written  Time Spent with Patient 15

## 2014-07-31 NOTE — Telephone Encounter (Signed)
per pof to sch pt appt-gave pt copy of sch °

## 2014-07-31 NOTE — Progress Notes (Signed)
[place] [phone]   [phone] Multidisciplinary thoracic oncology clinic  OFFICE PROGRESS NOTE  [address] [place] [address]  DIAGNOSIS: Stage IV (T3, N2, M1b) non-small cell lung cancer, adenocarcinoma with negative EGFR mutation and negative for gene translocation diagnosed in April 2016  PRIOR THERAPY: Stereotactic radiotherapy to the solitary brain lesion under the care of Dr. Tammi Klippel completed 07/03/2014.  CURRENT THERAPY: Concurrent chemoradiation with weekly carboplatin for AUC of 2 and paclitaxel 45 MG/M2. First dose expected 07/17/2014.  INTERVAL HISTORY: Destiny [name] 44 y.o. female returns to the clinic today for follow-up visit follow-up visit accompanied by her daughter. The patient is feeling fine today with no specific complaints except for occasional low back pain. She is tolerating her treatment with concurrent chemoradiation fairly well with no significant adverse effects. The patient denied having any significant fever or chills. She denied having any significant weight loss or night sweats. She has occasional nausea with no vomiting. She denied having any significant chest pain, shortness breath, cough or hemoptysis. She is here today to start cycle #3 of her concurrent chemoradiation.  MEDICAL HISTORY: Past Medical History  Diagnosis Date  . Thyroid nodule   . Depression   . Suicide attempt   . SVT (supraventricular tachycardia)     a. Long RP tachycardia;  b. 01/2014 s/p RFCA.  . Lung cancer     ALLERGIES:  has No Known Allergies.  MEDICATIONS:  Current Outpatient Prescriptions  Medication Sig Dispense Refill  . dexamethasone (DECADRON) 1 MG tablet Take 2 mg by mouth 2 (two) times daily.  0  . dexamethasone (DECADRON) 4 MG tablet Take 1 tablet (4 mg total) by mouth as directed. Dexamethasone Taper:  Take 4 mg morning, noon, and night for 5 days, then, 4 mg morning and  night for 10  days then 2 mg morning and night for 10 days, the 2 mg once daily for 10 days, then stop 30 tablet 1  . fluconazole (DIFLUCAN) 100 MG tablet Take 1 tablet (100 mg total) by mouth daily. 10 tablet 0  . HYDROcodone-acetaminophen (NORCO/VICODIN) 5-325 MG per tablet Take 1 tablet by mouth every 4 (four) hours as needed for moderate pain. 30 tablet 0  . lisinopril-hydrochlorothiazide (PRINZIDE,ZESTORETIC) 10-12.5 MG per tablet Take 1 tablet by mouth daily.    [name] LORazepam (ATIVAN) 1 MG tablet Take 1 mg by mouth every 8 (eight) hours as needed for anxiety.   0  . mirtazapine (REMERON) 30 MG tablet Take 1 tablet (30 mg total) by mouth at bedtime. 30 tablet 2  . nicotine (NICODERM CQ) 7 mg/24hr patch Place 1 patch (7 mg total) onto the skin daily. 14 patch 0  . nystatin (MYCOSTATIN) 100000 UNIT/ML suspension Take 5 mLs (500,000 Units total) by mouth 4 (four) times daily. 60 mL 1  . omeprazole (PRILOSEC) 20 MG capsule Take 20 mg by mouth daily as needed (for heartburn).    . prochlorperazine (COMPAZINE) 10 MG tablet Take 1 tablet (10 mg total) by mouth every 6 (six) hours as needed for nausea or vomiting. 30 tablet 0   No current facility-administered medications for this visit.    SURGICAL HISTORY:  Past Surgical History  Procedure Laterality Date  . Thyroid surgery      Removed thyroid nodule, states still has thyroid; 2010  . Supraventricular tachycardia ablation N/A 01/16/2014    Procedure: SUPRAVENTRICULAR TACHYCARDIA ABLATION;  Surgeon: Champ Mungo  Lovena Le, MD;  Location: [facility] CATH LAB;  Service: Cardiovascular;  Laterality: N/A;  . Tee without cardioversion N/A 06/12/2014    Procedure: TRANSESOPHAGEAL ECHOCARDIOGRAM (TEE);  Surgeon: Sueanne Margarita, MD;  Location: [facility] ENDOSCOPY;  Service: Cardiovascular;  Laterality: N/A;    REVIEW OF SYSTEMS:  A comprehensive review of systems was negative except for: Gastrointestinal: positive for nausea Musculoskeletal: positive for back pain   PHYSICAL  EXAMINATION: General appearance: alert, cooperative and no distress Head: Normocephalic, without obvious abnormality, atraumatic Neck: no adenopathy, no JVD, supple, symmetrical, trachea midline and thyroid not enlarged, symmetric, no tenderness/mass/nodules Lymph nodes: Cervical, supraclavicular, and axillary nodes normal. Resp: clear to auscultation bilaterally Back: symmetric, no curvature. ROM normal. No CVA tenderness. Cardio: regular rate and rhythm, S1, S2 normal, no murmur, click, rub or gallop GI: soft, non-tender; bowel sounds normal; no masses,  no organomegaly Extremities: extremities normal, atraumatic, no cyanosis or edema Neurologic: Alert and oriented X 3, normal strength and tone. Normal symmetric reflexes. Normal coordination and gait  ECOG PERFORMANCE STATUS: 1 - Symptomatic but completely ambulatory  Blood pressure 128/75, pulse 106, temperature 98.3 F (36.8 C), temperature source Oral, resp. rate 17, height _0  (1.702 m), weight 202 lb 8 oz (91.853 kg), last menstrual period 06/30/2014, SpO2 100 %.  LABORATORY DATA: Lab Results  Component Value Date   WBC 9.5 07/31/2014   HGB 13.5 07/31/2014   HCT 40.0 07/31/2014   MCV 92.1 07/31/2014   PLT 343 07/31/2014      Chemistry      Component Value Date/Time   NA 139 07/31/2014 0914   NA 132* 07/06/2014 0900   K 4.1 07/31/2014 0914   K 4.8 07/06/2014 0900   CL 100 07/06/2014 0900   CO2 20* 07/31/2014 0914   CO2 25 07/06/2014 0900   BUN 16.2 07/31/2014 0914   BUN 27* 07/06/2014 0900   CREATININE 0.7 07/31/2014 0914   CREATININE 1.08 07/06/2014 0900      Component Value Date/Time   CALCIUM 9.5 07/31/2014 0914   CALCIUM 8.5 07/06/2014 0900   ALKPHOS 98 07/31/2014 0914   ALKPHOS 60 06/11/2014 0922   AST 25 07/31/2014 0914   AST 21 06/11/2014 0922   ALT 44 07/31/2014 0914   ALT 17 06/11/2014 0922   BILITOT 0.21 07/31/2014 0914   BILITOT 0.7 06/11/2014 0922       RADIOGRAPHIC STUDIES: No results  found.  ASSESSMENT AND PLAN: This is a very pleasant 44 years old African-American female recently diagnosed with stage IV (T3, N2, M1b) non-small cell lung cancer, adenocarcinoma with negative EGFR mutation and negative ALK gene translocation diagnosed in April 2016 and presenting with 2 pulmonary nodules in the right upper lobe as well as mediastinal lymphadenopathy and solitary brain lesion. The patient underwent stereotactic radiotherapy to the solitary brain lesion. She is currently undergoing a course of concurrent chemoradiation with weekly carboplatin and paclitaxel and tolerating her treatment well. I recommended for the patient to proceed with cycle #3 today as a scheduled. I will refill Compazine 10 mg by mouth every 6 hours as needed for nausea. The patient would come back for follow-up visit in 2 weeks for reevaluation. She was advised to call immediately if she has any concerning symptoms in the interval.  The patient voices understanding of current disease status and treatment options and is in agreement with the current care plan.  All questions were answered. The patient knows to call the clinic with any problems, questions or concerns. We  can certainly see the patient much sooner if necessary.  Disclaimer: This note was dictated with voice recognition software. Similar sounding words can inadvertently be transcribed and may not be corrected upon review.

## 2014-07-31 NOTE — Patient Instructions (Signed)
[facility] Discharge Instructions for Patients Receiving Chemotherapy  Today you received the following chemotherapy agents:  Taxol and Carboplatin  To help prevent nausea and vomiting after your treatment, we encourage you to take your nausea medication as ordered per MD.   If you develop nausea and vomiting that is not controlled by your nausea medication, call the clinic.   BELOW ARE SYMPTOMS THAT SHOULD BE REPORTED IMMEDIATELY:  *FEVER GREATER THAN 100.5 F  *CHILLS WITH OR WITHOUT FEVER  NAUSEA AND VOMITING THAT IS NOT CONTROLLED WITH YOUR NAUSEA MEDICATION  *UNUSUAL SHORTNESS OF BREATH  *UNUSUAL BRUISING OR BLEEDING  TENDERNESS IN MOUTH AND THROAT WITH OR WITHOUT PRESENCE OF ULCERS  *URINARY PROBLEMS  *BOWEL PROBLEMS  UNUSUAL RASH Items with * indicate a potential emergency and should be followed up as soon as possible.  Feel free to call the clinic you have any questions or concerns. The clinic phone number is [phone].  Please show the CHEMO ALERT CARD at check-in to the Emergency Department and triage nurse.   

## 2014-08-01 ENCOUNTER — Ambulatory Visit
Admission: RE | Admit: 2014-08-01 | Discharge: 2014-08-01 | Disposition: A | Discharge status: Home / Self Care | Payer: Medicaid Other | Source: Ambulatory Visit | Attending: Radiation Oncology | Admitting: Radiation Oncology

## 2014-08-01 DIAGNOSIS — C3411 Malignant neoplasm of upper lobe, right bronchus or lung: Secondary | ICD-10-CM | POA: Diagnosis not present

## 2014-08-02 ENCOUNTER — Ambulatory Visit: Admission: RE | Admit: 2014-08-02 | Payer: Medicaid Other | Source: Ambulatory Visit

## 2014-08-03 ENCOUNTER — Encounter: Payer: Self-pay | Admitting: *Deleted

## 2014-08-03 ENCOUNTER — Telehealth: Payer: Self-pay | Admitting: *Deleted

## 2014-08-03 ENCOUNTER — Ambulatory Visit
Admission: RE | Admit: 2014-08-03 | Discharge: 2014-08-03 | Disposition: A | Discharge status: Home / Self Care | Payer: Medicaid Other | Source: Ambulatory Visit | Attending: Radiation Oncology | Admitting: Radiation Oncology

## 2014-08-03 ENCOUNTER — Telehealth: Payer: Self-pay | Admitting: Radiation Oncology

## 2014-08-03 ENCOUNTER — Other Ambulatory Visit: Payer: Self-pay | Admitting: *Deleted

## 2014-08-03 DIAGNOSIS — C3411 Malignant neoplasm of upper lobe, right bronchus or lung: Secondary | ICD-10-CM

## 2014-08-03 MED ORDER — HYDROCODONE-ACETAMINOPHEN 5-325 MG PO TABS: 1.0000 | ORAL_TABLET | ORAL | Status: DC | PRN

## 2014-08-03 NOTE — Telephone Encounter (Signed)
-----   Message from Heywood Footman, RN sent at 08/03/2014  2:12 PM EDT ----- Regarding: Pain medication and nicotine patch Ladies.  I have had multiple conversations with Destiny [name] today. She is requesting a new pain script and a different nicotine patch. Dr. Julien Nordmann prescribed both. She reports the hydrocodone doesn't help her hip and leg pain but, we are treating her lung. She reports the nicotine patch is making her smoke. Also, she reports difficulty swallowing. I plan to address this with Dr. Tammi Klippel today. Most likely the difficulty swallowing is related to the radiation and he will prescribe Carafate. Her speeched is garbled and her mood is all over the place. Thanks in advance for helping with the pain medication and nicotine patch.  [name]

## 2014-08-03 NOTE — Telephone Encounter (Signed)
LMOVM per MD, pt to take ibuprofen between pain pills, smoking cessation class is the other alternative if patch is not working. Rx is ready for pick up  before 430pm.  Request pt call back with any concerns.

## 2014-08-03 NOTE — Telephone Encounter (Signed)
Received call from patient today that she has four hydrocodone pills left. Reports the hydrocodone doesn't help relieve the pain in her hips and legs. Explained that Dr. Julien Nordmann wrote for her hydrocodone and this nurse would be glad to reach out to Dr. Worthy Flank nurse with this information. Patient reports difficulty swallowing. Reminded patient difficulty swallowing is a side effect associated with her radiation. Informed patient this RN would discuss this finding with Dr. Tammi Klippel and phone in a script to her [place] if [employer] approved. Lastly, patient reports her stage II nicotine patch makes her smoke cigarettes. She questions if Dr. Tammi Klippel will increase her dose back to what it was with her stage I patch. Explained Dr. Julien Nordmann prescribed her nicotine patch and this RN would discuss this matter with Dr. Worthy Flank nurse. Patient verbalized understanding.

## 2014-08-03 NOTE — Telephone Encounter (Signed)
Phoned patient as promised by Thayer Headings, RN. Patient calm but, speech garbled. Patient states, "I am hurting.i have been without pain medication for sometime now (patient reported during conversation earlier today she had four pain pills left)." Explained a request has been sent to Dr. Worthy Flank nurse reference pain medication and nicotine patient. Explained that once Dr. Tammi Klippel got out of his consult this RN would discuss with him her difficulty swallowing. Patient verbalized understanding. Patient stated, "thanks for calling me back sweetie, i know you will take care of me, god bless you."

## 2014-08-03 NOTE — Telephone Encounter (Signed)
Opened in error

## 2014-08-03 NOTE — Progress Notes (Signed)
Patient brought to room 2 , not scheduled to see MD till tomorrow, and that MD was in a consult at present,  patient c/o pain in throat to sternum when swallowing and in her hips to her legs, pain in hips started with radiation to her lung, did inform her the radiation would cause the throat and swallowing difficulties but not to her hips, tried to get her schedluled  To see Dr.Manning today, patient stated" that hydrocodone not help in my pain, I called the nurse this morning why hasn't she taken care of this already?",  I was taking vitals and trying to listen to patients venting said I didn't know what Dr. Johny Shears nurse talked to her about that Aldona Bar RN was at lunch at present, patient very disgruntled, asked if she had carafate for her throat, patient upset said" Forget it I can't wait any longer why can't the MD just call a new rx in?" I tried to explain that if pain medication was a narcotic ,MD would need to write a rx , Patient  Got up started walking out and came and said'I want to change Dr's,who do I need to call?", I informed her I was sorry she felt this way and that Joaquim Lai ,RN would give her a call when she was back from lunch  1:38 PM

## 2014-08-03 NOTE — Telephone Encounter (Signed)
Patient left voice mail message she isn't feeling well. Phoned patient. Her step son, Merrilee Seashore, reports she is sleeping and was feeling better before she laid down. Encouraged Merrilee Seashore to have her mother phone with further needs and he verbalized understanding.

## 2014-08-04 ENCOUNTER — Telehealth: Payer: Self-pay | Admitting: *Deleted

## 2014-08-04 ENCOUNTER — Ambulatory Visit
Admission: RE | Admit: 2014-08-04 | Discharge: 2014-08-04 | Disposition: A | Discharge status: Home / Self Care | Payer: Medicaid Other | Source: Ambulatory Visit | Attending: Radiation Oncology | Admitting: Radiation Oncology

## 2014-08-04 DIAGNOSIS — C3411 Malignant neoplasm of upper lobe, right bronchus or lung: Secondary | ICD-10-CM | POA: Diagnosis not present

## 2014-08-04 NOTE — Telephone Encounter (Signed)
Oncology Nurse Navigator Documentation  Oncology Nurse Navigator Flowsheets 08/04/2014  Navigator Encounter Type Other.  Received message from staff nurse regarding patient having complaints yesterday.  I called to follow up with patient.  Her son [name] answered her mobile phone.  I asked how patient was doing.  He stated she is just tired and he believes this is the result of chemotherapy.  I asked if the patient needed anything.  He stated no.  I asked that he call back if needed.   Patient Visit Type Telephone   Treatment Phase Current treatment   Barriers/Navigation Needs Education.  I gave him tips on how to help patient with fatigue with energy conservation and to call back if needed.   Education Verbal  Interventions Other  Education Method Verbal   Time Spent with Patient 15

## 2014-08-04 NOTE — Progress Notes (Signed)
Per Trudee Kuster, RT patient received radiation treatment but, refused PUT with Dr. Tammi Klippel.

## 2014-08-06 NOTE — Progress Notes (Signed)
  Radiation Oncology         [phone] ________________________________  Name: Destiny [name] MRN: [id number]  Date: 08/04/2014  DOB: 03-03-1971    Weekly Radiation Therapy Management    ICD-9-CM ICD-10-CM   1. Primary cancer of right upper lobe of lung 162.3 C34.11     Current Dose: Reviewed/Approved  Narrative: The patient was supposed to present for routine under treatment assessment.  She left before I met with her                    Set-up films were reviewed. The chart was checked.  Impression: The patient is tolerating radiation.  Plan:  Continue treatment as planned.    This document serves as a record of services personally performed by Tyler Pita, MD. It was created on his behalf by Lenn Cal, a trained medical scribe. The creation of this record is based on the scribe's personal observations and the provider's statements to them. This document has been checked and approved by the attending provider.    ________________________________  Sheral Apley. Tammi Klippel, M.D.

## 2014-08-08 ENCOUNTER — Ambulatory Visit
Admission: RE | Admit: 2014-08-08 | Discharge: 2014-08-08 | Disposition: A | Discharge status: Home / Self Care | Payer: Medicaid Other | Source: Ambulatory Visit | Attending: Radiation Oncology | Admitting: Radiation Oncology

## 2014-08-08 ENCOUNTER — Ambulatory Visit (HOSPITAL_BASED_OUTPATIENT_CLINIC_OR_DEPARTMENT_OTHER): Payer: Medicaid Other

## 2014-08-08 ENCOUNTER — Other Ambulatory Visit: Payer: Self-pay | Admitting: Medical Oncology

## 2014-08-08 ENCOUNTER — Ambulatory Visit (HOSPITAL_COMMUNITY): Admission: RE | Admit: 2014-08-08 | Payer: Medicaid Other | Source: Ambulatory Visit

## 2014-08-08 ENCOUNTER — Encounter: Payer: Self-pay | Admitting: Nurse Practitioner

## 2014-08-08 ENCOUNTER — Ambulatory Visit (HOSPITAL_BASED_OUTPATIENT_CLINIC_OR_DEPARTMENT_OTHER): Payer: Medicaid Other | Admitting: Nurse Practitioner

## 2014-08-08 ENCOUNTER — Other Ambulatory Visit: Payer: Medicaid Other

## 2014-08-08 ENCOUNTER — Encounter: Payer: Self-pay | Admitting: Radiation Oncology

## 2014-08-08 ENCOUNTER — Other Ambulatory Visit: Payer: Self-pay | Admitting: Nurse Practitioner

## 2014-08-08 ENCOUNTER — Other Ambulatory Visit (HOSPITAL_BASED_OUTPATIENT_CLINIC_OR_DEPARTMENT_OTHER): Payer: Medicaid Other

## 2014-08-08 ENCOUNTER — Ambulatory Visit (HOSPITAL_COMMUNITY): Payer: Medicaid Other

## 2014-08-08 ENCOUNTER — Other Ambulatory Visit: Payer: Self-pay | Admitting: *Deleted

## 2014-08-08 VITALS — BP 108/53 | HR 106 | Temp 98.4°F | Resp 18

## 2014-08-08 DIAGNOSIS — C3411 Malignant neoplasm of upper lobe, right bronchus or lung: Secondary | ICD-10-CM | POA: Diagnosis present

## 2014-08-08 DIAGNOSIS — Z5111 Encounter for antineoplastic chemotherapy: Secondary | ICD-10-CM

## 2014-08-08 DIAGNOSIS — C7931 Secondary malignant neoplasm of brain: Secondary | ICD-10-CM

## 2014-08-08 DIAGNOSIS — G8929 Other chronic pain: Secondary | ICD-10-CM | POA: Diagnosis not present

## 2014-08-08 DIAGNOSIS — R600 Localized edema: Secondary | ICD-10-CM | POA: Diagnosis not present

## 2014-08-08 DIAGNOSIS — R609 Edema, unspecified: Secondary | ICD-10-CM

## 2014-08-08 LAB — COMPREHENSIVE METABOLIC PANEL (CC13)
ALBUMIN: 3.7 g/dL (ref 3.5–5.0)
ALK PHOS: 83 U/L (ref 40–150)
ALT: 63 U/L — ABNORMAL HIGH (ref 0–55)
ANION GAP: 12 meq/L — AB (ref 3–11)
AST: 35 U/L — ABNORMAL HIGH (ref 5–34)
BUN: 13 mg/dL (ref 7.0–26.0)
CO2: 24 mEq/L (ref 22–29)
Calcium: 9.3 mg/dL (ref 8.4–10.4)
Chloride: 104 mEq/L (ref 98–109)
Creatinine: 0.8 mg/dL (ref 0.6–1.1)
EGFR: >90 mL/min/{1.73_m2} (ref >90)
GLUCOSE: 109 mg/dL (ref 70–140)
POTASSIUM: 3.7 meq/L (ref 3.5–5.1)
Sodium: 140 mEq/L (ref 136–145)
TOTAL PROTEIN: 7.6 g/dL (ref 6.4–8.3)
Total Bilirubin: 0.23 mg/dL (ref 0.20–1.20)

## 2014-08-08 LAB — CBC WITH DIFFERENTIAL/PLATELET
BASO%: 0.4 % (ref 0.0–2.0)
BASOS ABS: 0 10*3/uL (ref 0.0–0.1)
EOS%: 0.3 % (ref 0.0–7.0)
Eosinophils Absolute: 0 10*3/uL (ref 0.0–0.5)
HEMATOCRIT: 36.9 % (ref 34.8–46.6)
HGB: 12.5 g/dL (ref 11.6–15.9)
LYMPH#: 1 10*3/uL (ref 0.9–3.3)
LYMPH%: 20 % (ref 14.0–49.7)
MCH: 30.7 pg (ref 25.1–34.0)
MCHC: 33.8 g/dL (ref 31.5–36.0)
MCV: 90.9 fL (ref 79.5–101.0)
MONO#: 0.7 10*3/uL (ref 0.1–0.9)
MONO%: 12.8 % (ref 0.0–14.0)
NEUT#: 3.4 10*3/uL (ref 1.5–6.5)
NEUT%: 66.5 % (ref 38.4–76.8)
PLATELETS: 270 10*3/uL (ref 145–400)
RBC: 4.06 10*6/uL (ref 3.70–5.45)
RDW: 15.3 % — AB (ref 11.2–14.5)
WBC: 5.2 10*3/uL (ref 3.9–10.3)

## 2014-08-08 MED ORDER — SODIUM CHLORIDE 0.9 % IV SOLN
Freq: Once | INTRAVENOUS | Status: AC
  Administered 2014-08-08: 13:00:00 via INTRAVENOUS
  Filled 2014-08-08: qty 8

## 2014-08-08 MED ORDER — HYDROCODONE-ACETAMINOPHEN 5-325 MG PO TABS: 1.0000 | ORAL_TABLET | ORAL | Status: DC | PRN

## 2014-08-08 MED ORDER — PACLITAXEL CHEMO INJECTION 300 MG/50ML
45.0000 mg/m2 | Freq: Once | INTRAVENOUS | Status: AC
  Administered 2014-08-08: 90 mg via INTRAVENOUS
  Filled 2014-08-08: qty 15

## 2014-08-08 MED ORDER — SODIUM CHLORIDE 0.9 % IV SOLN
283.6000 mg | Freq: Once | INTRAVENOUS | Status: AC
  Administered 2014-08-08: 280 mg via INTRAVENOUS
  Filled 2014-08-08: qty 28

## 2014-08-08 MED ORDER — DIPHENHYDRAMINE HCL 50 MG/ML IJ SOLN
50.0000 mg | Freq: Once | INTRAMUSCULAR | Status: AC
  Administered 2014-08-08: 50 mg via INTRAVENOUS

## 2014-08-08 MED ORDER — DIPHENHYDRAMINE HCL 50 MG/ML IJ SOLN
INTRAMUSCULAR | Status: AC
  Filled 2014-08-08: qty 1

## 2014-08-08 MED ORDER — FAMOTIDINE IN NACL 20-0.9 MG/50ML-% IV SOLN
20.0000 mg | Freq: Once | INTRAVENOUS | Status: AC
Start: 2014-08-08 — End: 2014-08-08
  Administered 2014-08-08: 20 mg via INTRAVENOUS

## 2014-08-08 MED ORDER — FAMOTIDINE IN NACL 20-0.9 MG/50ML-% IV SOLN
INTRAVENOUS | Status: AC
  Filled 2014-08-08: qty 50

## 2014-08-08 MED ORDER — SODIUM CHLORIDE 0.9 % IV SOLN
Freq: Once | INTRAVENOUS | Status: AC
  Administered 2014-08-08: 12:00:00 via INTRAVENOUS

## 2014-08-08 NOTE — Progress Notes (Signed)
SYMPTOM MANAGEMENT CLINIC   HPI: Destiny [name] 44 y.o. female diagnosed with lung cancer; with brain metastasis.  Currently undergoing carboplatin/Taxol chemotherapy; with concurrent radiation therapy.  Patient presented to the [facility] today to receive cycle 4 of her chemotherapy.  She also continues with daily radiation treatments.  Patient reports noticing some right upper extremity edema and some left lower extremity edema for the past week or so.  She denies any known injury or trauma to either extremity.  She also denies any chest pain, chest pressure, shortness breath, pain with inspiration.  HPI  ROS  Past Medical History  Diagnosis Date  . Thyroid nodule   . Depression   . Suicide attempt   . SVT (supraventricular tachycardia)     a. Long RP tachycardia;  b. 01/2014 s/p RFCA.  . Lung cancer     Past Surgical History  Procedure Laterality Date  . Thyroid surgery      Removed thyroid nodule, states still has thyroid; 2010  . Supraventricular tachycardia ablation N/A 01/16/2014    Procedure: SUPRAVENTRICULAR TACHYCARDIA ABLATION;  Surgeon: Evans Lance, MD;  Location: [facility] CATH LAB;  Service: Cardiovascular;  Laterality: N/A;  . Tee without cardioversion N/A 06/12/2014    Procedure: TRANSESOPHAGEAL ECHOCARDIOGRAM (TEE);  Surgeon: Sueanne Margarita, MD;  Location: [facility] ENDOSCOPY;  Service: Cardiovascular;  Laterality: N/A;    has Paroxysmal supraventricular tachycardia; SVT (supraventricular tachycardia); Depression; GERD (gastroesophageal reflux disease); Hypertension; Hyperglycemia; Cigarette smoker; Poor dentition; Primary cancer of right upper lobe of lung; Lung nodules; Solitary brain metastasis; Chronic pain; and Extremity edema on her problem list.    has No Known Allergies.    Medication List       This list is accurate as of: 08/08/14  5:39 PM.  Always use your most recent med list.               dexamethasone 4 MG tablet  Commonly known as:  DECADRON    Take 1 tablet (4 mg total) by mouth as directed. Dexamethasone Taper:  Take 4 mg morning, noon, and night for 5 days, then, 4 mg morning and night for 10  days then 2 mg morning and night for 10 days, the 2 mg once daily for 10 days, then stop     dexamethasone 1 MG tablet  Commonly known as:  DECADRON  Take 2 mg by mouth 2 (two) times daily.     fluconazole 100 MG tablet  Commonly known as:  DIFLUCAN  Take 1 tablet (100 mg total) by mouth daily.     HYDROcodone-acetaminophen 5-325 MG per tablet  Commonly known as:  NORCO/VICODIN  Take 1 tablet by mouth every 4 (four) hours as needed for moderate pain.     lisinopril-hydrochlorothiazide 10-12.5 MG per tablet  Commonly known as:  PRINZIDE,ZESTORETIC  Take 1 tablet by mouth daily.     LORazepam 1 MG tablet  Commonly known as:  ATIVAN  Take 1 mg by mouth every 8 (eight) hours as needed for anxiety.     mirtazapine 30 MG tablet  Commonly known as:  REMERON  Take 1 tablet (30 mg total) by mouth at bedtime.     nicotine 7 mg/24hr patch  Commonly known as:  NICODERM CQ  Place 1 patch (7 mg total) onto the skin daily.     nystatin 100000 UNIT/ML suspension  Commonly known as:  MYCOSTATIN  Take 5 mLs (500,000 Units total) by mouth 4 (four) times daily.  omeprazole 20 MG capsule  Commonly known as:  PRILOSEC  Take 20 mg by mouth daily as needed (for heartburn).     prochlorperazine 10 MG tablet  Commonly known as:  COMPAZINE  Take 1 tablet (10 mg total) by mouth every 6 (six) hours as needed for nausea or vomiting.         PHYSICAL EXAMINATION  Oncology Vitals 08/08/2014 08/03/2014 07/31/2014 07/27/2014 07/24/2014 07/21/2014 07/18/2014  Height - - 170 cm - - - -  Weight 93.214 kg - 91.853 kg 90.946 kg - 85.095 kg -  Weight (lbs) 205 lbs 8 oz - 202 lbs 8 oz 200 lbs 8 oz - 187 lbs 10 oz -  BMI (kg/m2) - - 31.72 kg/m2 - - - -  Temp 98.4 97.7 98.3 - 98.2 - 98.4  Pulse 106 109 106 115 94 97 82  Resp _0 SpO2 -  100 100 - 100 - 100  BSA (m2) - - 2.08 m2 - - - -   BP Readings from Last 3 Encounters:  08/08/14 108/53  08/03/14 130/77  07/31/14 128/75    Physical Exam  Constitutional: She is oriented to person, place, and time and well-developed, well-nourished, and in no distress.  HENT:  Head: Normocephalic and atraumatic.  Eyes: Conjunctivae and EOM are normal. Pupils are equal, round, and reactive to light. Right eye exhibits no discharge. Left eye exhibits no discharge. No scleral icterus.  Neck: Normal range of motion.  Pulmonary/Chest: Effort normal. No respiratory distress.  Musculoskeletal: Normal range of motion. She exhibits edema. She exhibits no tenderness.  Patient noted to have edema to both her right upper extremity and her left lower extremity.  There is no erythema, warmth, or tenderness on exam.  Patient observed with full range of motion; ambulating with no difficulty whatsoever.  Neurological: She is alert and oriented to person, place, and time. Gait normal.  Skin: Skin is warm and dry. No rash noted. No erythema. No pallor.  Psychiatric: Affect normal.  Nursing note and vitals reviewed.   LABORATORY DATA:. Appointment on 08/08/2014  Component Date Value Ref Range Status  . WBC 08/08/2014 5.2  3.9 - 10.3 10e3/uL Final  . NEUT# 08/08/2014 3.4  1.5 - 6.5 10e3/uL Final  . HGB 08/08/2014 12.5  11.6 - 15.9 g/dL Final  . HCT 08/08/2014 36.9  34.8 - 46.6 % Final  . Platelets 08/08/2014 270  145 - 400 10e3/uL Final  . MCV 08/08/2014 90.9  79.5 - 101.0 fL Final  . MCH 08/08/2014 30.7  25.1 - 34.0 pg Final  . MCHC 08/08/2014 33.8  31.5 - 36.0 g/dL Final  . RBC 08/08/2014 4.06  3.70 - 5.45 10e6/uL Final  . RDW 08/08/2014 15.3* 11.2 - 14.5 % Final  . lymph# 08/08/2014 1.0  0.9 - 3.3 10e3/uL Final  . MONO# 08/08/2014 0.7  0.1 - 0.9 10e3/uL Final  . Eosinophils Absolute 08/08/2014 0.0  0.0 - 0.5 10e3/uL Final  . Basophils Absolute 08/08/2014 0.0  0.0 - 0.1 10e3/uL Final  . NEUT%  08/08/2014 66.5  38.4 - 76.8 % Final  . LYMPH% 08/08/2014 20.0  14.0 - 49.7 % Final  . MONO% 08/08/2014 12.8  0.0 - 14.0 % Final  . EOS% 08/08/2014 0.3  0.0 - 7.0 % Final  . BASO% 08/08/2014 0.4  0.0 - 2.0 % Final  . Sodium 08/08/2014 140  136 - 145 mEq/L Final  . Potassium 08/08/2014 3.7  3.5 -  5.1 mEq/L Final  . Chloride 08/08/2014 104  98 - 109 mEq/L Final  . CO2 08/08/2014 24  22 - 29 mEq/L Final  . Glucose 08/08/2014 109  70 - 140 mg/dl Final  . BUN 08/08/2014 13.0  7.0 - 26.0 mg/dL Final  . Creatinine 08/08/2014 0.8  0.6 - 1.1 mg/dL Final  . Total Bilirubin 08/08/2014 0.23  0.20 - 1.20 mg/dL Final  . Alkaline Phosphatase 08/08/2014 83  40 - 150 U/L Final  . AST 08/08/2014 35* 5 - 34 U/L Final  . ALT 08/08/2014 63* 0 - 55 U/L Final  . Total Protein 08/08/2014 7.6  6.4 - 8.3 g/dL Final  . Albumin 08/08/2014 3.7  3.5 - 5.0 g/dL Final  . Calcium 08/08/2014 9.3  8.4 - 10.4 mg/dL Final  . Anion Gap 08/08/2014 12* 3 - 11 mEq/L Final  . EGFR 08/08/2014 >90  >90 ml/min/1.73 m2 Final   eGFR is calculated using the CKD-EPI Creatinine Equation (2009)     RADIOGRAPHIC STUDIES: No results found.  ASSESSMENT/PLAN:    Primary cancer of right upper lobe of lung Patient appears to be tolerating her carboplatin/Taxol chemotherapy fairly well; with blood counts stable today.  Patient received cycle 4 of her carboplatin/Taxol chemotherapy today.  She also continues with daily radiation treatments.  Her final radiation treatment is scheduled for 08/31/2014.  Patient is scheduled to return to the [facility] for follow-up visit on 08/11/2014.  She is scheduled for labs and her next chemotherapy on 08/14/2014.   Chronic pain Patient continues to complain of some chronic pain; was given a refill of her hydrocodone today.   Extremity edema Patient reports noticing some right upper extremity edema and some left lower extremity edema for the past week or so.  She denies any known injury or trauma  to either extremity.  She also denies any chest pain, chest pressure, shortness breath, pain with inspiration.  On exam.-Patient does have increased edema to both her right arm and her left lower leg.  No evidence of injury or trauma to the sites.  There is also no erythema, warmth, or tenderness.  Patient is scheduled to obtain both a right upper extremity and a left lower extremity Doppler ultrasound to rule out DVT tomorrow morning, 08/09/2014.   Patient stated understanding of all instructions; and was in agreement with this plan of care. The patient knows to call the clinic with any problems, questions or concerns.   Review/collaboration with Dr. Julien Nordmann regarding all aspects of patient's visit today.   Total time spent with patient was 40 minutes;  with greater than 75 percent of that time spent in face to face counseling regarding patient's symptoms,  and coordination of care and follow up.  Disclaimer: This note was dictated with voice recognition software. Similar sounding words can inadvertently be transcribed and may not be corrected upon review.   Drue Second, NP 08/08/2014

## 2014-08-08 NOTE — Progress Notes (Signed)
Refill done and locked in injection room. 

## 2014-08-08 NOTE — Assessment & Plan Note (Signed)
Patient reports noticing some right upper extremity edema and some left lower extremity edema for the past week or so.  She denies any known injury or trauma to either extremity.  She also denies any chest pain, chest pressure, shortness breath, pain with inspiration.  On exam.-Patient does have increased edema to both her right arm and her left lower leg.  No evidence of injury or trauma to the sites.  There is also no erythema, warmth, or tenderness.  Patient is scheduled to obtain both a right upper extremity and a left lower extremity Doppler ultrasound to rule out DVT tomorrow morning, 08/09/2014.

## 2014-08-08 NOTE — Assessment & Plan Note (Signed)
Patient appears to be tolerating her carboplatin/Taxol chemotherapy fairly well; with blood counts stable today.  Patient received cycle 4 of her carboplatin/Taxol chemotherapy today.  She also continues with daily radiation treatments.  Her final radiation treatment is scheduled for 08/31/2014.  Patient is scheduled to return to the [facility] for follow-up visit on 08/11/2014.  She is scheduled for labs and her next chemotherapy on 08/14/2014.

## 2014-08-08 NOTE — Progress Notes (Signed)
Patient reporting R arm swelling (mildly larger than left in forearm near [facility]); and bilateral lower extremity edema. She noticed over the weekend. Denies pain or injury to areas. Selena Lesser, NP notified. At chairside to assess.  Dopplers ordered after treatment today. OK to proceed with chemo. Patient informed of plan and rationale, she verbalizes understanding.   R arm and L leg doppler apt 3:30 after rad onc today per Selena Lesser, NP. Patient aware and verbalizes understanding.

## 2014-08-08 NOTE — Patient Instructions (Signed)
[facility] Discharge Instructions for Patients Receiving Chemotherapy  Today you received the following chemotherapy agents Taxol and Carboplatin.  To help prevent nausea and vomiting after your treatment, we encourage you to take your nausea medication as prescribed.   If you develop nausea and vomiting that is not controlled by your nausea medication, call the clinic.   BELOW ARE SYMPTOMS THAT SHOULD BE REPORTED IMMEDIATELY:  *FEVER GREATER THAN 100.5 F  *CHILLS WITH OR WITHOUT FEVER  NAUSEA AND VOMITING THAT IS NOT CONTROLLED WITH YOUR NAUSEA MEDICATION  *UNUSUAL SHORTNESS OF BREATH  *UNUSUAL BRUISING OR BLEEDING  TENDERNESS IN MOUTH AND THROAT WITH OR WITHOUT PRESENCE OF ULCERS  *URINARY PROBLEMS  *BOWEL PROBLEMS  UNUSUAL RASH Items with * indicate a potential emergency and should be followed up as soon as possible.  Feel free to call the clinic you have any questions or concerns. The clinic phone number is [phone].  Please show the CHEMO ALERT CARD at check-in to the Emergency Department and triage nurse.   

## 2014-08-08 NOTE — Progress Notes (Signed)
Weekly Management Note:  Site: Right lung Current Dose:  3000  cGy Projected Dose: 6600  cGy  Narrative: The patient is seen today for routine under treatment assessment. CBCT/MVCT images/port films were reviewed. The chart was reviewed.   The patient is seen today prior to her treatment, having refused to be seen by Dr. Tammi Klippel last week feeling that he should have refilled her Vicodin prescription.  This was initially prescribing Dr. Julien Nordmann, and it has since been refilled by Dr. Mckinley Jewel.  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  Her weight is actually up 3 pounds over the past week.  Physical Examination: There were no vitals filed for this visit..  Weight:  .  Oral cavity and oropharynx are without evidence for candidiasis.  Lungs are clear to auscultation.  Impression: Tolerating radiation therapy well, except for mild esophagitis.  She'll continue with her Vicodin as needed.    Plan: Continue radiation therapy as planned.

## 2014-08-08 NOTE — Assessment & Plan Note (Signed)
Patient continues to complain of some chronic pain; was given a refill of her hydrocodone today.

## 2014-08-09 ENCOUNTER — Telehealth: Payer: Self-pay | Admitting: *Deleted

## 2014-08-09 ENCOUNTER — Ambulatory Visit (HOSPITAL_BASED_OUTPATIENT_CLINIC_OR_DEPARTMENT_OTHER)
Admission: RE | Admit: 2014-08-09 | Discharge: 2014-08-09 | Disposition: A | Discharge status: Home / Self Care | Payer: Medicaid Other | Source: Ambulatory Visit | Attending: Nurse Practitioner | Admitting: Nurse Practitioner

## 2014-08-09 ENCOUNTER — Ambulatory Visit (HOSPITAL_COMMUNITY)
Admission: RE | Admit: 2014-08-09 | Discharge: 2014-08-09 | Disposition: A | Discharge status: Home / Self Care | Payer: Medicaid Other | Source: Ambulatory Visit | Attending: Nurse Practitioner | Admitting: Nurse Practitioner

## 2014-08-09 ENCOUNTER — Ambulatory Visit
Admission: RE | Admit: 2014-08-09 | Discharge: 2014-08-09 | Disposition: A | Discharge status: Home / Self Care | Payer: Medicaid Other | Source: Ambulatory Visit | Attending: Radiation Oncology | Admitting: Radiation Oncology

## 2014-08-09 DIAGNOSIS — R609 Edema, unspecified: Secondary | ICD-10-CM | POA: Insufficient documentation

## 2014-08-09 DIAGNOSIS — C3411 Malignant neoplasm of upper lobe, right bronchus or lung: Secondary | ICD-10-CM | POA: Diagnosis not present

## 2014-08-09 NOTE — Telephone Encounter (Signed)
LM for rtn call- Pt doppler studies were both negative for DVT. Check on status of pt pain/edema in left leg and right arm

## 2014-08-09 NOTE — Progress Notes (Signed)
Left lower extremity venous duplex completed.  Left:  No evidence of DVT, superficial thrombosis, or Baker's cyst.  Right:  Negative for DVT in the common femoral vein.  

## 2014-08-09 NOTE — Progress Notes (Signed)
Right upper extremity venous duplex completed.  Right:  No evidence of DVT or superficial thrombosis. Left:  Negative for DVT in the subclavian vein.

## 2014-08-10 ENCOUNTER — Ambulatory Visit
Admission: RE | Admit: 2014-08-10 | Discharge: 2014-08-10 | Disposition: A | Discharge status: Home / Self Care | Payer: Medicaid Other | Source: Ambulatory Visit | Attending: Radiation Oncology | Admitting: Radiation Oncology

## 2014-08-10 DIAGNOSIS — C3411 Malignant neoplasm of upper lobe, right bronchus or lung: Secondary | ICD-10-CM | POA: Diagnosis not present

## 2014-08-11 ENCOUNTER — Ambulatory Visit
Admission: RE | Admit: 2014-08-11 | Discharge: 2014-08-11 | Disposition: A | Discharge status: Home / Self Care | Payer: Medicaid Other | Source: Ambulatory Visit | Attending: Radiation Oncology | Admitting: Radiation Oncology

## 2014-08-11 ENCOUNTER — Other Ambulatory Visit: Payer: Medicaid Other

## 2014-08-11 ENCOUNTER — Encounter: Payer: Self-pay | Admitting: Physician Assistant

## 2014-08-11 ENCOUNTER — Telehealth: Payer: Self-pay | Admitting: Physician Assistant

## 2014-08-11 ENCOUNTER — Encounter: Payer: Self-pay | Admitting: *Deleted

## 2014-08-11 ENCOUNTER — Ambulatory Visit (HOSPITAL_BASED_OUTPATIENT_CLINIC_OR_DEPARTMENT_OTHER): Payer: Medicaid Other | Admitting: Physician Assistant

## 2014-08-11 VITALS — BP 91/65 | HR 97 | Temp 97.9°F | Resp 18 | Ht 67.0 in | Wt 201.8 lb

## 2014-08-11 DIAGNOSIS — C7931 Secondary malignant neoplasm of brain: Secondary | ICD-10-CM

## 2014-08-11 DIAGNOSIS — K209 Esophagitis, unspecified without bleeding: Secondary | ICD-10-CM

## 2014-08-11 DIAGNOSIS — C3411 Malignant neoplasm of upper lobe, right bronchus or lung: Secondary | ICD-10-CM | POA: Diagnosis present

## 2014-08-11 MED ORDER — SUCRALFATE 1 G PO TABS
1.0000 g | ORAL_TABLET | Freq: Three times a day (TID) | ORAL | Status: DC
Start: 2014-08-11 — End: 2014-09-11

## 2014-08-11 NOTE — Telephone Encounter (Signed)
Pt confirmed labs/ov per 06/03 POF, gave pt AVS and Calendar... KJ

## 2014-08-11 NOTE — Progress Notes (Signed)
[place] Telephone:([phone]   Fax:(336) [phone] Multidisciplinary thoracic oncology clinic  OFFICE PROGRESS NOTE  [address], [address] [place] [address]  DIAGNOSIS: Stage IV (T3, N2, M1b) non-small cell lung cancer, adenocarcinoma with negative EGFR mutation and negative for gene translocation diagnosed in April 2016  PRIOR THERAPY: Stereotactic radiotherapy to the solitary brain lesion under the care of Dr. Tammi Klippel completed 07/03/2014.  CURRENT THERAPY: Concurrent chemoradiation with weekly carboplatin for AUC of 2 and paclitaxel 45 MG/M2. First dose given 07/17/2014.  INTERVAL HISTORY: Destiny [name] 44 y.o. female returns to the clinic today for follow-up visit. The patient is feeling fine today with no specific complaints except for occasional low back pain. She is tolerating her treatment with concurrent chemoradiation fairly well with no significant adverse effects. The patient denied having any significant fever or chills. She denied having any significant weight loss or night sweats. She has occasional nausea with no vomiting. She denied having any significant chest pain, shortness breath, cough or hemoptysis. She is here today for evaluation prior to cycle #5 scheduled for 08/14/2014. She is currently scheduled to complete radiation therapy approximately 08/31/2014 or 09/01/2014.   MEDICAL HISTORY: Past Medical History  Diagnosis Date  . Thyroid nodule   . Depression   . Suicide attempt   . SVT (supraventricular tachycardia)     a. Long RP tachycardia;  b. 01/2014 s/p RFCA.  . Lung cancer     ALLERGIES:  has No Known Allergies.  MEDICATIONS:  Current Outpatient Prescriptions  Medication Sig Dispense Refill  . dexamethasone (DECADRON) 1 MG tablet Take 2 mg by mouth 2 (two) times daily.  0  . dexamethasone (DECADRON) 4 MG tablet Take 1 tablet (4 mg total) by mouth as directed. Dexamethasone Taper:  Take 4 mg  morning, noon, and night for 5 days, then, 4 mg morning and night for 10  days then 2 mg morning and night for 10 days, the 2 mg once daily for 10 days, then stop 30 tablet 1  . fluconazole (DIFLUCAN) 100 MG tablet Take 1 tablet (100 mg total) by mouth daily. 10 tablet 0  . HYDROcodone-acetaminophen (NORCO/VICODIN) 5-325 MG per tablet Take 1 tablet by mouth every 4 (four) hours as needed for moderate pain. 30 tablet 0  . lisinopril-hydrochlorothiazide (PRINZIDE,ZESTORETIC) 10-12.5 MG per tablet Take 1 tablet by mouth daily.    [name] LORazepam (ATIVAN) 1 MG tablet Take 1 mg by mouth every 8 (eight) hours as needed for anxiety.   0  . mirtazapine (REMERON) 30 MG tablet Take 1 tablet (30 mg total) by mouth at bedtime. 30 tablet 2  . nicotine (NICODERM CQ) 7 mg/24hr patch Place 1 patch (7 mg total) onto the skin daily. 14 patch 0  . nystatin (MYCOSTATIN) 100000 UNIT/ML suspension Take 5 mLs (500,000 Units total) by mouth 4 (four) times daily. 60 mL 1  . omeprazole (PRILOSEC) 20 MG capsule Take 20 mg by mouth daily as needed (for heartburn).    . prochlorperazine (COMPAZINE) 10 MG tablet Take 1 tablet (10 mg total) by mouth every 6 (six) hours as needed for nausea or vomiting. 30 tablet 0  . sucralfate (CARAFATE) 1 G tablet Take 1 tablet (1 g total) by mouth 4 (four) times daily -  with meals and at bedtime. 5 min before meals for radiation induced esophagitis 120 tablet 2   No current facility-administered medications for this visit.  SURGICAL HISTORY:  Past Surgical History  Procedure Laterality Date  . Thyroid surgery      Removed thyroid nodule, states still has thyroid; 2010  . Supraventricular tachycardia ablation N/A 01/16/2014    Procedure: SUPRAVENTRICULAR TACHYCARDIA ABLATION;  Surgeon: Evans Lance, MD;  Location: [facility] CATH LAB;  Service: Cardiovascular;  Laterality: N/A;  . Tee without cardioversion N/A 06/12/2014    Procedure: TRANSESOPHAGEAL ECHOCARDIOGRAM (TEE);  Surgeon: Sueanne Margarita,  MD;  Location: [facility] ENDOSCOPY;  Service: Cardiovascular;  Laterality: N/A;    REVIEW OF SYSTEMS:  A comprehensive review of systems was negative except for: Gastrointestinal: positive for nausea Musculoskeletal: positive for back pain   PHYSICAL EXAMINATION: General appearance: alert, cooperative and no distress Head: Normocephalic, without obvious abnormality, atraumatic Neck: no adenopathy, no JVD, supple, symmetrical, trachea midline and thyroid not enlarged, symmetric, no tenderness/mass/nodules Lymph nodes: Cervical, supraclavicular, and axillary nodes normal. Resp: clear to auscultation bilaterally Back: symmetric, no curvature. ROM normal. No CVA tenderness. Cardio: regular rate and rhythm, S1, S2 normal, no murmur, click, rub or gallop GI: soft, non-tender; bowel sounds normal; no masses,  no organomegaly Extremities: extremities normal, atraumatic, no cyanosis or edema Neurologic: Alert and oriented X 3, normal strength and tone. Normal symmetric reflexes. Normal coordination and gait  ECOG PERFORMANCE STATUS: 1 - Symptomatic but completely ambulatory  Blood pressure 91/65, pulse 97, temperature 97.9 F (36.6 C), temperature source Oral, resp. rate 18, height _0  (1.702 m), weight 201 lb 12.8 oz (91.536 kg), SpO2 100 %.  LABORATORY DATA: Lab Results  Component Value Date   WBC 5.2 08/08/2014   HGB 12.5 08/08/2014   HCT 36.9 08/08/2014   MCV 90.9 08/08/2014   PLT 270 08/08/2014      Chemistry      Component Value Date/Time   NA 140 08/08/2014 1106   NA 132* 07/06/2014 0900   K 3.7 08/08/2014 1106   K 4.8 07/06/2014 0900   CL 100 07/06/2014 0900   CO2 24 08/08/2014 1106   CO2 25 07/06/2014 0900   BUN 13.0 08/08/2014 1106   BUN 27* 07/06/2014 0900   CREATININE 0.8 08/08/2014 1106   CREATININE 1.08 07/06/2014 0900      Component Value Date/Time   CALCIUM 9.3 08/08/2014 1106   CALCIUM 8.5 07/06/2014 0900   ALKPHOS 83 08/08/2014 1106   ALKPHOS 60 06/11/2014 0922     AST 35* 08/08/2014 1106   AST 21 06/11/2014 0922   ALT 63* 08/08/2014 1106   ALT 17 06/11/2014 0922   BILITOT 0.23 08/08/2014 1106   BILITOT 0.7 06/11/2014 0922       RADIOGRAPHIC STUDIES: No results found.  ASSESSMENT AND PLAN: This is a very pleasant 44 years old African-American female recently diagnosed with stage IV (T3, N2, M1b) non-small cell lung cancer, adenocarcinoma with negative EGFR mutation and negative ALK gene translocation diagnosed in April 2016 and presenting with 2 pulmonary nodules in the right upper lobe as well as mediastinal lymphadenopathy and solitary brain lesion. The patient underwent stereotactic radiotherapy to the solitary brain lesion. She is currently undergoing a course of concurrent chemoradiation with weekly carboplatin and paclitaxel and tolerating her treatment well. She will continue with her course of concurrent chemoradiation as scheduled. The patient would return for a follow-up visit in 2 weeks for reevaluation. She was advised to call immediately if she has any concerning symptoms in the interval.  The patient voices understanding of current disease status and treatment options and is in agreement  with the current care plan.  All questions were answered. The patient knows to call the clinic with any problems, questions or concerns. We can certainly see the patient much sooner if necessary.  Carlton Adam, PA-C 08/11/2014   Disclaimer: This note was dictated with voice recognition software. Similar sounding words can inadvertently be transcribed and may not be corrected upon review.

## 2014-08-11 NOTE — Progress Notes (Signed)
  Radiation Oncology         (336) [phone] ________________________________  Name: Destiny [name] MRN: [id number]  Date: 08/11/2014  DOB: [birth year]    Weekly Radiation Therapy Management    ICD-9-CM ICD-10-CM   1. Acute esophagitis 530.12 K20.9 sucralfate (CARAFATE) 1 G tablet    Current Dose: 36 Gy     Planned Dose:  66 Gy  Narrative . . . . . . . . The patient presents for routine under treatment assessment. Weight and vitals stable. Pt reports increasing difficulty and discomfort swallowing. Otherwise, she is without complaint.                                  Set-up films were reviewed.                                 The chart was checked.  Physical Findings. . . . Weight essentially stable.  No significant changes.  Impression . . . . . . . The patient is tolerating radiation.  Plan . . . . . . . . . . . . Prescribe Carafate and continue treatment as planned.  This document serves as a record of services personally performed by Tyler Pita, MD. It was created on his behalf by Darcus Austin, a trained medical scribe. The creation of this record is based on the scribe's personal observations and the provider's statements to them. This document has been checked and approved by the attending provider.    ________________________________  Sheral Apley. Tammi Klippel, M.D.

## 2014-08-11 NOTE — Progress Notes (Signed)
Oncology Nurse Navigator Documentation  Oncology Nurse Navigator Flowsheets 08/11/2014  Navigator Encounter Type Other/Follow up  Patient Visit Type Follow-up.  Spoke with patient today in [place].  She is undergoing treatment and wanted to see if she needed anything.  She states she is doing well.  Has her son with her today and he also states they don't need anything at this time.   Treatment Phase Treatment  Barriers/Navigation Needs No barriers at this time  Education None noted at this time  Interventions Support  Education Method   Support Groups/Services Friends and Family  Time Spent with Patient 15

## 2014-08-12 NOTE — Patient Instructions (Signed)
Continue your course of concurrent chemoradiation as scheduled Followup in 2 weeks 

## 2014-08-14 ENCOUNTER — Other Ambulatory Visit: Payer: Self-pay | Admitting: Physician Assistant

## 2014-08-14 ENCOUNTER — Ambulatory Visit
Admission: RE | Admit: 2014-08-14 | Discharge: 2014-08-14 | Disposition: A | Discharge status: Home / Self Care | Payer: Medicaid Other | Source: Ambulatory Visit | Attending: Radiation Oncology | Admitting: Radiation Oncology

## 2014-08-14 ENCOUNTER — Ambulatory Visit (HOSPITAL_BASED_OUTPATIENT_CLINIC_OR_DEPARTMENT_OTHER): Payer: Medicaid Other | Admitting: Lab

## 2014-08-14 ENCOUNTER — Ambulatory Visit (HOSPITAL_BASED_OUTPATIENT_CLINIC_OR_DEPARTMENT_OTHER): Payer: Medicaid Other

## 2014-08-14 VITALS — BP 130/76 | HR 115 | Temp 98.2°F | Resp 18

## 2014-08-14 DIAGNOSIS — C7931 Secondary malignant neoplasm of brain: Secondary | ICD-10-CM

## 2014-08-14 DIAGNOSIS — Z5111 Encounter for antineoplastic chemotherapy: Secondary | ICD-10-CM

## 2014-08-14 DIAGNOSIS — C3411 Malignant neoplasm of upper lobe, right bronchus or lung: Secondary | ICD-10-CM

## 2014-08-14 LAB — CBC WITH DIFFERENTIAL/PLATELET
BASO%: 0.2 % (ref 0.0–2.0)
Basophils Absolute: 0 10*3/uL (ref 0.0–0.1)
EOS%: 0.2 % (ref 0.0–7.0)
Eosinophils Absolute: 0 10*3/uL (ref 0.0–0.5)
HCT: 37.5 % (ref 34.8–46.6)
HGB: 13.1 g/dL (ref 11.6–15.9)
LYMPH%: 18.3 % (ref 14.0–49.7)
MCH: 31.3 pg (ref 25.1–34.0)
MCHC: 34.9 g/dL (ref 31.5–36.0)
MCV: 89.5 fL (ref 79.5–101.0)
MONO#: 0.5 10*3/uL (ref 0.1–0.9)
MONO%: 9.1 % (ref 0.0–14.0)
NEUT#: 3.9 10*3/uL (ref 1.5–6.5)
NEUT%: 72.2 % (ref 38.4–76.8)
PLATELETS: 182 10*3/uL (ref 145–400)
RBC: 4.19 10*6/uL (ref 3.70–5.45)
RDW: 15.9 % — ABNORMAL HIGH (ref 11.2–14.5)
WBC: 5.4 10*3/uL (ref 3.9–10.3)
lymph#: 1 10*3/uL (ref 0.9–3.3)

## 2014-08-14 LAB — COMPREHENSIVE METABOLIC PANEL (CC13)
ALK PHOS: 97 U/L (ref 40–150)
ALT: 34 U/L (ref 0–55)
ANION GAP: 10 meq/L (ref 3–11)
AST: 18 U/L (ref 5–34)
Albumin: 3.6 g/dL (ref 3.5–5.0)
BUN: 15.7 mg/dL (ref 7.0–26.0)
CALCIUM: 9.5 mg/dL (ref 8.4–10.4)
CO2: 23 mEq/L (ref 22–29)
CREATININE: 0.7 mg/dL (ref 0.6–1.1)
Chloride: 105 mEq/L (ref 98–109)
Glucose: 100 mg/dl (ref 70–140)
Potassium: 3.6 mEq/L (ref 3.5–5.1)
SODIUM: 138 meq/L (ref 136–145)
Total Bilirubin: 0.26 mg/dL (ref 0.20–1.20)
Total Protein: 7.7 g/dL (ref 6.4–8.3)

## 2014-08-14 MED ORDER — SODIUM CHLORIDE 0.9 % IV SOLN
Freq: Once | INTRAVENOUS | Status: AC
  Administered 2014-08-14: 12:00:00 via INTRAVENOUS
  Filled 2014-08-14: qty 8

## 2014-08-14 MED ORDER — SODIUM CHLORIDE 0.9 % IV SOLN
Freq: Once | INTRAVENOUS | Status: AC
  Administered 2014-08-14: 12:00:00 via INTRAVENOUS

## 2014-08-14 MED ORDER — DIPHENHYDRAMINE HCL 50 MG/ML IJ SOLN
50.0000 mg | Freq: Once | INTRAMUSCULAR | Status: AC
Start: 2014-08-14 — End: 2014-08-14
  Administered 2014-08-14: 50 mg via INTRAVENOUS

## 2014-08-14 MED ORDER — DIPHENHYDRAMINE HCL 50 MG/ML IJ SOLN
INTRAMUSCULAR | Status: AC
  Filled 2014-08-14: qty 1

## 2014-08-14 MED ORDER — SODIUM CHLORIDE 0.9 % IV SOLN
280.0000 mg | Freq: Once | INTRAVENOUS | Status: AC
  Administered 2014-08-14: 280 mg via INTRAVENOUS
  Filled 2014-08-14: qty 28

## 2014-08-14 MED ORDER — PACLITAXEL CHEMO INJECTION 300 MG/50ML
45.0000 mg/m2 | Freq: Once | INTRAVENOUS | Status: AC
  Administered 2014-08-14: 90 mg via INTRAVENOUS
  Filled 2014-08-14: qty 15

## 2014-08-14 MED ORDER — FAMOTIDINE IN NACL 20-0.9 MG/50ML-% IV SOLN
INTRAVENOUS | Status: AC
  Filled 2014-08-14: qty 50

## 2014-08-14 MED ORDER — FAMOTIDINE IN NACL 20-0.9 MG/50ML-% IV SOLN
20.0000 mg | Freq: Once | INTRAVENOUS | Status: AC
  Administered 2014-08-14: 20 mg via INTRAVENOUS

## 2014-08-14 MED ORDER — OXYCODONE-ACETAMINOPHEN 5-325 MG PO TABS
1.0000 | ORAL_TABLET | Freq: Once | ORAL | Status: AC
  Administered 2014-08-14: 1 via ORAL

## 2014-08-14 MED ORDER — OXYCODONE-ACETAMINOPHEN 5-325 MG PO TABS
ORAL_TABLET | ORAL | Status: AC
  Filled 2014-08-14: qty 1

## 2014-08-14 NOTE — Patient Instructions (Signed)
[facility] Discharge Instructions for Patients Receiving Chemotherapy  Today you received the following chemotherapy agents: Taxol, Carboplatin   To help prevent nausea and vomiting after your treatment, we encourage you to take your nausea medication as directed.    If you develop nausea and vomiting that is not controlled by your nausea medication, call the clinic.   BELOW ARE SYMPTOMS THAT SHOULD BE REPORTED IMMEDIATELY:  *FEVER GREATER THAN 100.5 F  *CHILLS WITH OR WITHOUT FEVER  NAUSEA AND VOMITING THAT IS NOT CONTROLLED WITH YOUR NAUSEA MEDICATION  *UNUSUAL SHORTNESS OF BREATH  *UNUSUAL BRUISING OR BLEEDING  TENDERNESS IN MOUTH AND THROAT WITH OR WITHOUT PRESENCE OF ULCERS  *URINARY PROBLEMS  *BOWEL PROBLEMS  UNUSUAL RASH Items with * indicate a potential emergency and should be followed up as soon as possible.  Feel free to call the clinic you have any questions or concerns. The clinic phone number is [phone].  Please show the CHEMO ALERT CARD at check-in to the Emergency Department and triage nurse.   

## 2014-08-14 NOTE — Progress Notes (Signed)
Patient complaining of pain at radiation site rated a 9 out of 10. Pt states that she takes the vidocin at home with no relief and that MD is aware. Mindy, Adrena's nurse aware and to notify Adrena PA . Percocet 5/'325mg'$  ordered per Adrena PA.

## 2014-08-15 ENCOUNTER — Ambulatory Visit
Admission: RE | Admit: 2014-08-15 | Discharge: 2014-08-15 | Disposition: A | Discharge status: Home / Self Care | Payer: Medicaid Other | Source: Ambulatory Visit | Attending: Radiation Oncology | Admitting: Radiation Oncology

## 2014-08-15 DIAGNOSIS — C3411 Malignant neoplasm of upper lobe, right bronchus or lung: Secondary | ICD-10-CM | POA: Diagnosis not present

## 2014-08-16 ENCOUNTER — Ambulatory Visit
Admission: RE | Admit: 2014-08-16 | Discharge: 2014-08-16 | Disposition: A | Discharge status: Home / Self Care | Payer: Medicaid Other | Source: Ambulatory Visit | Attending: Radiation Oncology | Admitting: Radiation Oncology

## 2014-08-16 DIAGNOSIS — C3411 Malignant neoplasm of upper lobe, right bronchus or lung: Secondary | ICD-10-CM | POA: Diagnosis not present

## 2014-08-17 ENCOUNTER — Encounter: Payer: Self-pay | Admitting: Radiation Oncology

## 2014-08-17 ENCOUNTER — Ambulatory Visit
Admission: RE | Admit: 2014-08-17 | Discharge: 2014-08-17 | Disposition: A | Discharge status: Home / Self Care | Payer: Medicaid Other | Source: Ambulatory Visit | Attending: Radiation Oncology | Admitting: Radiation Oncology

## 2014-08-17 VITALS — BP 111/72 | HR 93 | Temp 98.0°F | Ht 67.0 in | Wt 204.5 lb

## 2014-08-17 DIAGNOSIS — C3411 Malignant neoplasm of upper lobe, right bronchus or lung: Secondary | ICD-10-CM

## 2014-08-17 MED ORDER — CALCIUM CARBONATE-SIMETHICONE 1000-60 MG PO CHEW: 1.0000 | CHEWABLE_TABLET | Freq: Four times a day (QID) | ORAL | Status: DC | PRN

## 2014-08-17 MED ORDER — FLUCONAZOLE 10 MG/ML PO SUSR: 100.0000 mg | Freq: Every day | ORAL | Status: DC

## 2014-08-17 NOTE — Progress Notes (Signed)
Ms. Crumm reports essophageal discomfort with frequent belching without any relief with Prilosec.  She is requesting additional medication management.  Taking Carafate with minimal relief.  Last chemotherapy on this past Monday .  Appears fatigued today.

## 2014-08-17 NOTE — Progress Notes (Signed)
  Radiation Oncology         (336) [phone] ________________________________  Name: Destiny [name] MRN: [id number]  Date: 08/17/2014  DOB: May 21, 1970    Weekly Radiation Therapy Management    ICD-9-CM ICD-10-CM   1. Primary cancer of right upper lobe of lung 162.3 C34.11 fluconazole (DIFLUCAN) 10 MG/ML suspension     Calcium Carbonate-Simethicone 1000-60 MG CHEW    Current Dose: 44 Gy     Planned Dose:  66 Gy  Narrative . . . . . . . . The patient presents for routine under treatment assessment. Weight and vitals stable. [name] reports essophageal discomfort with frequent belching without any relief with Prilosec.  She is requesting additional medication management.  Taking Carafate with minimal relief.  Last chemotherapy on this past Monday .  Appears fatigued today.   Set-up films were reviewed.  The chart was checked.  Physical Findings. . . . Weight essentially stable.  No significant changes.  Impression . . . . . . . The patient is tolerating radiation.  Plan . . . . . . . . . . . . Prescribe Carafate and continue treatment as planned. Given a prescription for Diflucan liquid twice daily for ten days for esophageal candidiasis. Also, recommend Maaloox Max.     This document serves as a record of services personally performed by Tyler Pita, MD. It was created on his behalf by Jeralene Peters, a trained medical scribe. The creation of this record is based on the scribe's personal observations and the provider's statements to them. This document has been checked and approved by the attending provider.         ________________________________  Sheral Apley. Tammi Klippel, M.D.

## 2014-08-18 ENCOUNTER — Other Ambulatory Visit: Payer: Medicaid Other

## 2014-08-18 ENCOUNTER — Ambulatory Visit
Admission: RE | Admit: 2014-08-18 | Discharge: 2014-08-18 | Disposition: A | Discharge status: Home / Self Care | Payer: Medicaid Other | Source: Ambulatory Visit | Attending: Radiation Oncology | Admitting: Radiation Oncology

## 2014-08-18 ENCOUNTER — Ambulatory Visit: Payer: Medicaid Other | Admitting: Physician Assistant

## 2014-08-18 DIAGNOSIS — C3411 Malignant neoplasm of upper lobe, right bronchus or lung: Secondary | ICD-10-CM | POA: Diagnosis not present

## 2014-08-21 ENCOUNTER — Ambulatory Visit (HOSPITAL_BASED_OUTPATIENT_CLINIC_OR_DEPARTMENT_OTHER): Payer: Medicaid Other | Admitting: Nurse Practitioner

## 2014-08-21 ENCOUNTER — Telehealth: Payer: Self-pay | Admitting: Medical Oncology

## 2014-08-21 ENCOUNTER — Other Ambulatory Visit: Payer: Self-pay | Admitting: Medical Oncology

## 2014-08-21 ENCOUNTER — Ambulatory Visit (HOSPITAL_BASED_OUTPATIENT_CLINIC_OR_DEPARTMENT_OTHER): Payer: Medicaid Other

## 2014-08-21 ENCOUNTER — Ambulatory Visit
Admission: RE | Admit: 2014-08-21 | Discharge: 2014-08-21 | Disposition: A | Discharge status: Home / Self Care | Payer: Medicaid Other | Source: Ambulatory Visit | Attending: Radiation Oncology | Admitting: Radiation Oncology

## 2014-08-21 ENCOUNTER — Other Ambulatory Visit (HOSPITAL_BASED_OUTPATIENT_CLINIC_OR_DEPARTMENT_OTHER): Payer: Medicaid Other

## 2014-08-21 ENCOUNTER — Encounter: Payer: Self-pay | Admitting: Nurse Practitioner

## 2014-08-21 VITALS — BP 122/77 | HR 90 | Temp 97.7°F | Resp 18

## 2014-08-21 DIAGNOSIS — C3411 Malignant neoplasm of upper lobe, right bronchus or lung: Secondary | ICD-10-CM

## 2014-08-21 DIAGNOSIS — G8929 Other chronic pain: Secondary | ICD-10-CM | POA: Diagnosis not present

## 2014-08-21 DIAGNOSIS — Z5111 Encounter for antineoplastic chemotherapy: Secondary | ICD-10-CM

## 2014-08-21 DIAGNOSIS — K209 Esophagitis, unspecified without bleeding: Secondary | ICD-10-CM

## 2014-08-21 DIAGNOSIS — C7931 Secondary malignant neoplasm of brain: Secondary | ICD-10-CM | POA: Diagnosis not present

## 2014-08-21 DIAGNOSIS — F419 Anxiety disorder, unspecified: Secondary | ICD-10-CM

## 2014-08-21 LAB — CBC WITH DIFFERENTIAL/PLATELET
BASO%: 0.4 % (ref 0.0–2.0)
Basophils Absolute: 0 10*3/uL (ref 0.0–0.1)
EOS%: 0.3 % (ref 0.0–7.0)
Eosinophils Absolute: 0 10*3/uL (ref 0.0–0.5)
HCT: 38.3 % (ref 34.8–46.6)
HGB: 12.9 g/dL (ref 11.6–15.9)
LYMPH%: 16.5 % (ref 14.0–49.7)
MCH: 30.6 pg (ref 25.1–34.0)
MCHC: 33.7 g/dL (ref 31.5–36.0)
MCV: 90.8 fL (ref 79.5–101.0)
MONO#: 0.7 10*3/uL (ref 0.1–0.9)
MONO%: 11.6 % (ref 0.0–14.0)
NEUT#: 4.4 10*3/uL (ref 1.5–6.5)
NEUT%: 71.2 % (ref 38.4–76.8)
PLATELETS: 170 10*3/uL (ref 145–400)
RBC: 4.22 10*6/uL (ref 3.70–5.45)
RDW: 16.3 % — ABNORMAL HIGH (ref 11.2–14.5)
WBC: 6.2 10*3/uL (ref 3.9–10.3)
lymph#: 1 10*3/uL (ref 0.9–3.3)

## 2014-08-21 LAB — COMPREHENSIVE METABOLIC PANEL (CC13)
ALT: 27 U/L (ref 0–55)
ANION GAP: 12 meq/L — AB (ref 3–11)
AST: 22 U/L (ref 5–34)
Albumin: 3.7 g/dL (ref 3.5–5.0)
Alkaline Phosphatase: 69 U/L (ref 40–150)
BUN: 14.1 mg/dL (ref 7.0–26.0)
CALCIUM: 9.6 mg/dL (ref 8.4–10.4)
CHLORIDE: 106 meq/L (ref 98–109)
CO2: 21 meq/L — AB (ref 22–29)
Creatinine: 0.8 mg/dL (ref 0.6–1.1)
GLUCOSE: 88 mg/dL (ref 70–140)
Potassium: 4.1 mEq/L (ref 3.5–5.1)
SODIUM: 138 meq/L (ref 136–145)
TOTAL PROTEIN: 7.5 g/dL (ref 6.4–8.3)
Total Bilirubin: 0.35 mg/dL (ref 0.20–1.20)

## 2014-08-21 MED ORDER — DIPHENHYDRAMINE HCL 50 MG/ML IJ SOLN
INTRAMUSCULAR | Status: AC
  Filled 2014-08-21: qty 1

## 2014-08-21 MED ORDER — PACLITAXEL CHEMO INJECTION 300 MG/50ML
45.0000 mg/m2 | Freq: Once | INTRAVENOUS | Status: AC
  Administered 2014-08-21: 90 mg via INTRAVENOUS
  Filled 2014-08-21: qty 15

## 2014-08-21 MED ORDER — FAMOTIDINE IN NACL 20-0.9 MG/50ML-% IV SOLN
20.0000 mg | Freq: Once | INTRAVENOUS | Status: AC
  Administered 2014-08-21: 20 mg via INTRAVENOUS

## 2014-08-21 MED ORDER — MORPHINE SULFATE 4 MG/ML IJ SOLN
INTRAMUSCULAR | Status: AC
  Filled 2014-08-21: qty 1

## 2014-08-21 MED ORDER — SODIUM CHLORIDE 0.9 % IV SOLN
283.6000 mg | Freq: Once | INTRAVENOUS | Status: AC
  Administered 2014-08-21: 280 mg via INTRAVENOUS
  Filled 2014-08-21: qty 28

## 2014-08-21 MED ORDER — LIDOCAINE VISCOUS 2 % MT SOLN: 20.0000 mL | Freq: Four times a day (QID) | OROMUCOSAL | Status: DC | PRN

## 2014-08-21 MED ORDER — OXYCODONE-ACETAMINOPHEN 5-325 MG PO TABS
1.0000 | ORAL_TABLET | Freq: Once | ORAL | Status: AC
  Administered 2014-08-21: 1 via ORAL

## 2014-08-21 MED ORDER — SODIUM CHLORIDE 0.9 % IV SOLN
250.0000 mL | Freq: Once | INTRAVENOUS | Status: AC
  Administered 2014-08-21: 250 mL via INTRAVENOUS

## 2014-08-21 MED ORDER — SODIUM CHLORIDE 0.9 % IV SOLN
Freq: Once | INTRAVENOUS | Status: AC
  Administered 2014-08-21: 12:00:00 via INTRAVENOUS
  Filled 2014-08-21: qty 8

## 2014-08-21 MED ORDER — MORPHINE SULFATE 4 MG/ML IJ SOLN
2.0000 mg | INTRAMUSCULAR | Status: AC | PRN
  Administered 2014-08-21 (×2): 2 mg via INTRAVENOUS

## 2014-08-21 MED ORDER — LORAZEPAM 2 MG/ML IJ SOLN
INTRAMUSCULAR | Status: AC
  Filled 2014-08-21: qty 1

## 2014-08-21 MED ORDER — OXYCODONE-ACETAMINOPHEN 5-325 MG PO TABS
ORAL_TABLET | ORAL | Status: AC
  Filled 2014-08-21: qty 1

## 2014-08-21 MED ORDER — DIPHENHYDRAMINE HCL 50 MG/ML IJ SOLN
50.0000 mg | Freq: Once | INTRAMUSCULAR | Status: AC
  Administered 2014-08-21: 50 mg via INTRAVENOUS

## 2014-08-21 MED ORDER — FAMOTIDINE IN NACL 20-0.9 MG/50ML-% IV SOLN
INTRAVENOUS | Status: AC
  Filled 2014-08-21: qty 50

## 2014-08-21 MED ORDER — OXYCODONE-ACETAMINOPHEN 5-325 MG PO TABS: 1.0000 | ORAL_TABLET | ORAL | Status: DC | PRN

## 2014-08-21 MED ORDER — LORAZEPAM 2 MG/ML IJ SOLN
0.5000 mg | Freq: Once | INTRAMUSCULAR | Status: AC
  Administered 2014-08-21: 0.5 mg via INTRAVENOUS

## 2014-08-21 NOTE — Telephone Encounter (Signed)
[name] , RN from radiation came to tell me that pt is asking for pain med and that current pian med not effective. Per Julien Nordmann , appt for [facility] today for pain evaluation.urgent pof sent

## 2014-08-21 NOTE — Assessment & Plan Note (Signed)
Patient has a history of chronic pain; and has been taking hydrocodone 2 tablets every 4 hours with only minimal effectiveness.  She is complaining of increasing discomfort to her esophageal area within this past week or so.  On exam.-It does appear the patient may very well be suffering from chemotherapy/radiation-induced esophagitis discomfort.  Confirmed the patient is already taking Carafate as directed; and taking Prilosec on a once daily basis.  Advised that patient switch from hydrocodone to Percocet.  Prescription for Percocet was given to the patient today.  Patient was instructed to discontinue all hydrocodone if she is taking the Percocet.

## 2014-08-21 NOTE — Progress Notes (Signed)
SYMPTOM MANAGEMENT CLINIC   HPI: Destiny [name] 44 y.o. female diagnosed with lung cancer; with brain metastasis.  Currently undergoing concurrent chemoradiation with carboplatin/paclitaxel chemotherapy.  Patient is complaining of increasing esophageal discomfort.  She denies any oral lesions or posterior oropharynx pain.  Patient is taking the Carafate as previously directed; and taking the Prilosec once daily.  HPI  ROS  Past Medical History  Diagnosis Date  . Thyroid nodule   . Depression   . Suicide attempt   . SVT (supraventricular tachycardia)     a. Long RP tachycardia;  b. 01/2014 s/p RFCA.  . Lung cancer     Past Surgical History  Procedure Laterality Date  . Thyroid surgery      Removed thyroid nodule, states still has thyroid; 2010  . Supraventricular tachycardia ablation N/A 01/16/2014    Procedure: SUPRAVENTRICULAR TACHYCARDIA ABLATION;  Surgeon: Evans Lance, MD;  Location: [facility] CATH LAB;  Service: Cardiovascular;  Laterality: N/A;  . Tee without cardioversion N/A 06/12/2014    Procedure: TRANSESOPHAGEAL ECHOCARDIOGRAM (TEE);  Surgeon: Sueanne Margarita, MD;  Location: [facility] ENDOSCOPY;  Service: Cardiovascular;  Laterality: N/A;    has Paroxysmal supraventricular tachycardia; SVT (supraventricular tachycardia); Depression; GERD (gastroesophageal reflux disease); Hypertension; Hyperglycemia; Cigarette smoker; Poor dentition; Primary cancer of right upper lobe of lung; Lung nodules; Solitary brain metastasis; Chronic pain; Extremity edema; and Esophagitis on her problem list.    has No Known Allergies.    Medication List       This list is accurate as of: 08/21/14  3:36 PM.  Always use your most recent med list.               Calcium Carbonate-Simethicone 1000-60 MG Chew  Chew 1 tablet by mouth every 6 (six) hours as needed (discomfort in abdomen).     dexamethasone 4 MG tablet  Commonly known as:  DECADRON  Take 1 tablet (4 mg total) by mouth as directed.  Dexamethasone Taper:  Take 4 mg morning, noon, and night for 5 days, then, 4 mg morning and night for 10  days then 2 mg morning and night for 10 days, the 2 mg once daily for 10 days, then stop     dexamethasone 1 MG tablet  Commonly known as:  DECADRON  Take 2 mg by mouth 2 (two) times daily.     fluconazole 10 MG/ML suspension  Commonly known as:  DIFLUCAN  Take 10 mLs (100 mg total) by mouth daily.     HYDROcodone-acetaminophen 5-325 MG per tablet  Commonly known as:  NORCO/VICODIN  Take 1 tablet by mouth every 4 (four) hours as needed for moderate pain.     lidocaine 2 % solution  Commonly known as:  XYLOCAINE  Use as directed 20 mLs in the mouth or throat every 6 (six) hours as needed for mouth pain.     lisinopril-hydrochlorothiazide 10-12.5 MG per tablet  Commonly known as:  PRINZIDE,ZESTORETIC  Take 1 tablet by mouth daily.     LORazepam 1 MG tablet  Commonly known as:  ATIVAN  Take 1 mg by mouth every 8 (eight) hours as needed for anxiety.     mirtazapine 30 MG tablet  Commonly known as:  REMERON  Take 1 tablet (30 mg total) by mouth at bedtime.     nicotine 7 mg/24hr patch  Commonly known as:  NICODERM CQ  Place 1 patch (7 mg total) onto the skin daily.     nystatin 100000 UNIT/ML suspension  Commonly known as:  MYCOSTATIN  Take 5 mLs (500,000 Units total) by mouth 4 (four) times daily.     omeprazole 20 MG capsule  Commonly known as:  PRILOSEC  Take 20 mg by mouth daily as needed (for heartburn).     oxyCODONE-acetaminophen 5-325 MG per tablet  Commonly known as:  PERCOCET/ROXICET  Take 1-2 tablets by mouth every 4 (four) hours as needed for severe pain.     prochlorperazine 10 MG tablet  Commonly known as:  COMPAZINE  Take 1 tablet (10 mg total) by mouth every 6 (six) hours as needed for nausea or vomiting.     sucralfate 1 G tablet  Commonly known as:  CARAFATE  Take 1 tablet (1 g total) by mouth 4 (four) times daily -  with meals and at bedtime. 5  min before meals for radiation induced esophagitis         PHYSICAL EXAMINATION  Oncology Vitals 08/21/2014 08/17/2014 08/14/2014 08/11/2014 08/08/2014 08/03/2014 07/31/2014  Height - 170 cm - 170 cm - - 170 cm  Weight - 92.761 kg - 91.536 kg 93.214 kg - 91.853 kg  Weight (lbs) - 204 lbs 8 oz - 201 lbs 13 oz 205 lbs 8 oz - 202 lbs 8 oz  BMI (kg/m2) - 32.03 kg/m2 - 31.61 kg/m2 - - 31.72 kg/m2  Temp 97.7 98 98.2 97.9 98.4 97.7 98.3  Pulse 90 93 115 97 106 109 106  Resp 18 - _0 SpO2 100 100 100 100 - 100 100  BSA (m2) - 2.09 m2 - 2.08 m2 - - 2.08 m2   BP Readings from Last 3 Encounters:  08/21/14 122/77  08/17/14 111/72  08/14/14 130/76    Physical Exam  Constitutional: She is oriented to person, place, and time and well-developed, well-nourished, and in no distress.  HENT:  Head: Normocephalic and atraumatic.  Mouth/Throat: Oropharynx is clear and moist. No oropharyngeal exudate.  Eyes: Conjunctivae and EOM are normal. Pupils are equal, round, and reactive to light. Right eye exhibits no discharge. Left eye exhibits no discharge. No scleral icterus.  Neck: Normal range of motion.  Pulmonary/Chest: Effort normal. No respiratory distress.  Musculoskeletal: Normal range of motion.  Neurological: She is alert and oriented to person, place, and time.  Skin: Skin is warm and dry.  Psychiatric: Affect normal.  Nursing note and vitals reviewed.   LABORATORY DATA:. Appointment on 08/21/2014  Component Date Value Ref Range Status  . WBC 08/21/2014 6.2  3.9 - 10.3 10e3/uL Final  . NEUT# 08/21/2014 4.4  1.5 - 6.5 10e3/uL Final  . HGB 08/21/2014 12.9  11.6 - 15.9 g/dL Final  . HCT 08/21/2014 38.3  34.8 - 46.6 % Final  . Platelets 08/21/2014 170  145 - 400 10e3/uL Final  . MCV 08/21/2014 90.8  79.5 - 101.0 fL Final  . MCH 08/21/2014 30.6  25.1 - 34.0 pg Final  . MCHC 08/21/2014 33.7  31.5 - 36.0 g/dL Final  . RBC 08/21/2014 4.22  3.70 - 5.45 10e6/uL Final  . RDW 08/21/2014 16.3*  11.2 - 14.5 % Final  . lymph# 08/21/2014 1.0  0.9 - 3.3 10e3/uL Final  . MONO# 08/21/2014 0.7  0.1 - 0.9 10e3/uL Final  . Eosinophils Absolute 08/21/2014 0.0  0.0 - 0.5 10e3/uL Final  . Basophils Absolute 08/21/2014 0.0  0.0 - 0.1 10e3/uL Final  . NEUT% 08/21/2014 71.2  38.4 - 76.8 % Final  . LYMPH% 08/21/2014 16.5  14.0 - 49.7 %  Final  . MONO% 08/21/2014 11.6  0.0 - 14.0 % Final  . EOS% 08/21/2014 0.3  0.0 - 7.0 % Final  . BASO% 08/21/2014 0.4  0.0 - 2.0 % Final  . Sodium 08/21/2014 138  136 - 145 mEq/L Final  . Potassium 08/21/2014 4.1  3.5 - 5.1 mEq/L Final  . Chloride 08/21/2014 106  98 - 109 mEq/L Final  . CO2 08/21/2014 21* 22 - 29 mEq/L Final  . Glucose 08/21/2014 88  70 - 140 mg/dl Final  . BUN 08/21/2014 14.1  7.0 - 26.0 mg/dL Final  . Creatinine 08/21/2014 0.8  0.6 - 1.1 mg/dL Final  . Total Bilirubin 08/21/2014 0.35  0.20 - 1.20 mg/dL Final  . Alkaline Phosphatase 08/21/2014 69  40 - 150 U/L Final  . AST 08/21/2014 22  5 - 34 U/L Final  . ALT 08/21/2014 27  0 - 55 U/L Final  . Total Protein 08/21/2014 7.5  6.4 - 8.3 g/dL Final  . Albumin 08/21/2014 3.7  3.5 - 5.0 g/dL Final  . Calcium 08/21/2014 9.6  8.4 - 10.4 mg/dL Final  . Anion Gap 08/21/2014 12* 3 - 11 mEq/L Final  . EGFR 08/21/2014 >90  >90 ml/min/1.73 m2 Final   eGFR is calculated using the CKD-EPI Creatinine Equation (2009)     RADIOGRAPHIC STUDIES: No results found.  ASSESSMENT/PLAN:    Primary cancer of right upper lobe of lung Patient presented to the [facility] today to receive cycle 6 of her carboplatin/paclitaxel chemotherapy regimen.  Patient has completed stereotactic brain irradiation; and continues with radiation therapy to her lungs.  Patient final radiation treatment is scheduled for 09/01/2014.  Blood counts remain essentially stable.  Patient does appear to be experiencing some increasing esophagitis discomfort; but otherwise tolerating both chemotherapy and radiation therapy fairly  well.  Patient will return on 08/28/2014 for labs, follow up visit, and her last scheduled chemotherapy.  Chronic pain Patient has a history of chronic pain; and has been taking hydrocodone 2 tablets every 4 hours with only minimal effectiveness.  She is complaining of increasing discomfort to her esophageal area within this past week or so.  On exam.-It does appear the patient may very well be suffering from chemotherapy/radiation-induced esophagitis discomfort.  Confirmed the patient is already taking Carafate as directed; and taking Prilosec on a once daily basis.  Advised that patient switch from hydrocodone to Percocet.  Prescription for Percocet was given to the patient today.  Patient was instructed to discontinue all hydrocodone if she is taking the Percocet.  Esophagitis Patient is complaining of increasing esophageal discomfort.  She denies any oral lesions or posterior oropharynx pain.  Patient is taking the Carafate as previously directed; and taking the Prilosec once daily.  On exam.-No oral lesions and posterior oropharynx clear.  Patient appears to be managing all secretions with no difficulty.  Patient is rating her esophageal discomfort at a 10 out of 10 on the pain scale; but does appear fairly calm and working on her cell phone when observed.  Patient was given morphine IV; and Percocet 1 while at the [place].  Patient also became tearful at one point; and Ativan 0.5 mg IV was given.  Long discussion with the patient regarding treatment options for esophagitis.  Will prescribe viscous lidocaine for the patient to try.  Also, advised patient to continue with the Carafate his previously directed; and to increase the Prilosec to twice daily.  Advised patient to discontinue the hydrocodone; and to try  the Percocet instead.  Printed instructions of all points reviewed with the patient was given to the patient today.  Patient stated understanding of all instructions;  and was in agreement with this plan of care. The patient knows to call the clinic with any problems, questions or concerns.   Review/collaboration with Dr. Julien Nordmann regarding all aspects of patient's visit today.   Total time spent with patient was 40 minutes;  with greater than 75 percent of that time spent in face to face counseling regarding patient's symptoms,  and coordination of care and follow up.  Disclaimer: This note was dictated with voice recognition software. Similar sounding words can inadvertently be transcribed and may not be corrected upon review.   Drue Second, NP 08/21/2014

## 2014-08-21 NOTE — Patient Instructions (Signed)
[facility] Discharge Instructions for Patients Receiving Chemotherapy  Today you received the following chemotherapy agents Taxol and Carboplatin.  To help prevent nausea and vomiting after your treatment, we encourage you to take your nausea medication as prescribed.   If you develop nausea and vomiting that is not controlled by your nausea medication, call the clinic.   BELOW ARE SYMPTOMS THAT SHOULD BE REPORTED IMMEDIATELY:  *FEVER GREATER THAN 100.5 F  *CHILLS WITH OR WITHOUT FEVER  NAUSEA AND VOMITING THAT IS NOT CONTROLLED WITH YOUR NAUSEA MEDICATION  *UNUSUAL SHORTNESS OF BREATH  *UNUSUAL BRUISING OR BLEEDING  TENDERNESS IN MOUTH AND THROAT WITH OR WITHOUT PRESENCE OF ULCERS  *URINARY PROBLEMS  *BOWEL PROBLEMS  UNUSUAL RASH Items with * indicate a potential emergency and should be followed up as soon as possible.  Feel free to call the clinic you have any questions or concerns. The clinic phone number is [phone].  Please show the CHEMO ALERT CARD at check-in to the Emergency Department and triage nurse.   

## 2014-08-21 NOTE — Progress Notes (Signed)
[place], NP assessed patient in infusion room. MS '2mg'$  IV given per instruction for continued pain, no relief from dose given at 1313. Chemo paused for MS injection

## 2014-08-21 NOTE — Assessment & Plan Note (Signed)
Patient is complaining of increasing esophageal discomfort.  She denies any oral lesions or posterior oropharynx pain.  Patient is taking the Carafate as previously directed; and taking the Prilosec once daily.  On exam.-No oral lesions and posterior oropharynx clear.  Patient appears to be managing all secretions with no difficulty.  Patient is rating her esophageal discomfort at a 10 out of 10 on the pain scale; but does appear fairly calm and working on her cell phone when observed.  Patient was given morphine IV; and Percocet 1 while at the [place].  Patient also became tearful at one point; and Ativan 0.5 mg IV was given.  Long discussion with the patient regarding treatment options for esophagitis.  Will prescribe viscous lidocaine for the patient to try.  Also, advised patient to continue with the Carafate his previously directed; and to increase the Prilosec to twice daily.  Advised patient to discontinue the hydrocodone; and to try the Percocet instead.  Printed instructions of all points reviewed with the patient was given to the patient today.

## 2014-08-21 NOTE — Progress Notes (Signed)
Vaughn, NP, pt is tearful. NP to order IVP Ativan.   14 Pt states she is feeling better, pain greatly decreased. Verbalized understanding to pick up Lidocaine and fill new pain medication script.

## 2014-08-21 NOTE — Assessment & Plan Note (Signed)
Patient presented to the [place] today to receive cycle 6 of her carboplatin/paclitaxel chemotherapy regimen.  Patient has completed stereotactic brain irradiation; and continues with radiation therapy to her lungs.  Patient final radiation treatment is scheduled for 09/01/2014.  Blood counts remain essentially stable.  Patient does appear to be experiencing some increasing esophagitis discomfort; but otherwise tolerating both chemotherapy and radiation therapy fairly well.  Patient will return on 08/28/2014 for labs, follow up visit, and her last scheduled chemotherapy.

## 2014-08-21 NOTE — Progress Notes (Signed)
Patient presented to rad onc nursing. Patient states, "I need my pain medication and I am not leaving here until it get it." Explained to the patient again that Dr. Julien Nordmann is the only physician that can prescribe/refill her pain medication because she is under contract with him. Escorted patient to the lobby. Explained the situation to Abelina Bachelor, RN for Dr. Julien Nordmann. Diane confirms the patient's Vicodin is due and will discuss refilling it with Dr. Julien Nordmann. Patient reports vicodin doesn't work and request something stronger. Escorted patient to main lobby to check in for lab work. Instructed patient after lab work to sit in the main lobby where Diane, RN would find her and provided her with a refill. Patient verbalized understanding.

## 2014-08-22 ENCOUNTER — Telehealth: Payer: Self-pay | Admitting: Nurse Practitioner

## 2014-08-22 ENCOUNTER — Ambulatory Visit
Admission: RE | Admit: 2014-08-22 | Discharge: 2014-08-22 | Disposition: A | Discharge status: Home / Self Care | Payer: Medicaid Other | Source: Ambulatory Visit | Attending: Radiation Oncology | Admitting: Radiation Oncology

## 2014-08-22 DIAGNOSIS — C3411 Malignant neoplasm of upper lobe, right bronchus or lung: Secondary | ICD-10-CM | POA: Diagnosis not present

## 2014-08-22 NOTE — Telephone Encounter (Signed)
per pof to sch pt appt-gave pt copy of avs °

## 2014-08-23 ENCOUNTER — Ambulatory Visit
Admission: RE | Admit: 2014-08-23 | Discharge: 2014-08-23 | Disposition: A | Discharge status: Home / Self Care | Payer: Medicaid Other | Source: Ambulatory Visit | Attending: Radiation Oncology | Admitting: Radiation Oncology

## 2014-08-23 DIAGNOSIS — C3411 Malignant neoplasm of upper lobe, right bronchus or lung: Secondary | ICD-10-CM | POA: Diagnosis not present

## 2014-08-24 ENCOUNTER — Ambulatory Visit
Admission: RE | Admit: 2014-08-24 | Discharge: 2014-08-24 | Disposition: A | Discharge status: Home / Self Care | Payer: Medicaid Other | Source: Ambulatory Visit | Attending: Radiation Oncology | Admitting: Radiation Oncology

## 2014-08-24 DIAGNOSIS — C3411 Malignant neoplasm of upper lobe, right bronchus or lung: Secondary | ICD-10-CM | POA: Diagnosis not present

## 2014-08-25 ENCOUNTER — Other Ambulatory Visit: Payer: Self-pay | Admitting: Nurse Practitioner

## 2014-08-25 ENCOUNTER — Ambulatory Visit
Admission: RE | Admit: 2014-08-25 | Discharge: 2014-08-25 | Disposition: A | Discharge status: Home / Self Care | Payer: Medicaid Other | Source: Ambulatory Visit | Attending: Radiation Oncology | Admitting: Radiation Oncology

## 2014-08-25 ENCOUNTER — Encounter: Payer: Self-pay | Admitting: Radiation Oncology

## 2014-08-25 ENCOUNTER — Telehealth: Payer: Self-pay | Admitting: Nurse Practitioner

## 2014-08-25 ENCOUNTER — Telehealth: Payer: Self-pay | Admitting: *Deleted

## 2014-08-25 ENCOUNTER — Telehealth: Payer: Self-pay | Admitting: Radiation Oncology

## 2014-08-25 VITALS — BP 126/79 | HR 110 | Resp 16 | Wt 202.4 lb

## 2014-08-25 DIAGNOSIS — C3411 Malignant neoplasm of upper lobe, right bronchus or lung: Secondary | ICD-10-CM

## 2014-08-25 MED ORDER — OXYCODONE-ACETAMINOPHEN 5-325 MG PO TABS: 1.0000 | ORAL_TABLET | ORAL | Status: DC | PRN

## 2014-08-25 NOTE — Telephone Encounter (Signed)
TC from patient who was sitting in the lobby stating she needed her pain medication refilled.  Pt apparently had registered as a walk-in for Selena Lesser, NP. Pt had received Percocet 5/325 # 30 on 08/21/14. Pt states she only has 4 tabs left.  Pt is experiencing radiation esophagitis. Spoke with Selena Lesser, NP and received refill prescription for Percocet 5/325 #45 tab 1-2 tabs every 4 hours as needed for pain. Also instructed pt not to take more often that d/t acetaminophen.  Pt. Stated she was unaware of toxicity limits of acetaminophen.. Pt. Is to contact Dr. Tammi Klippel for refills while she is under his care.  Spoke with patient in the lobby and had her sign for her prescription and with instructions to contact Dr. Johny Shears office for further refills/pain management. Pt states she is out of lidocaine-message left with Dr. Johny Shears nurse to have this refilled.  Pt able to voice understanding of instructions

## 2014-08-25 NOTE — Telephone Encounter (Signed)
Patient presented as a walk-in to the [place] today following her radiation treatment with complaint of continued radiation-induced esophagitis discomfort.  She states that she is most completely out of the oxycodone that she was prescribed this past Monday, 08/21/2014.  She denies any other new symptoms whatsoever.  Since it was Friday afternoon; and patient would not have enough pain medication to last through the weekend.-Patient was given a refill of her pain medication per her request.  After careful review of previous progress notes and discussion with radiation oncologist nurse Samantha-determine the patient does have a pain contract with medical oncologist Dr. Earlie Server.  Patient was advised that all further narcotic pain medication will need to be filled per Dr. Julien Nordmann per his pain contract with the patient.

## 2014-08-25 NOTE — Progress Notes (Signed)
Weight and vital stable. Reports pain chest and throat 5 on a scale of 0-10.Taking decadron 4 mg once per day. No thrush noted. Reports pain and difficulty swallowing are less. Hyperpigmentation without desquamation center of chest noted. Denies using radiaplex as recommended. Reports shortness of breath.

## 2014-08-25 NOTE — Progress Notes (Signed)
  Radiation Oncology         (336) [phone] ________________________________  Name: Destiny [name] MRN: [id number]  Date: 08/25/2014  DOB: 1971-02-27    Weekly Radiation Therapy Management    ICD-9-CM ICD-10-CM   1. Primary cancer of right upper lobe of lung 162.3 C34.11     Current Dose: 56 Gy     Planned Dose:  66 Gy  Narrative . . . . . . . . The patient presents for routine under treatment assessment. Weight and vitals stable. States her breathing is fine, only sob when going up stairs. Denies a cough. Reports chest and throat pain as a 5/10. Taking decadron 4 mg once per day. Reports pain and difficulty swallowing are less. Hyperpigmentation without desquamation center of chest noted. Denies using radiaplex as recommended.  Set-up films were reviewed.  The chart was checked.  Physical Findings. . . . Weight essentially stable.  No significant changes.  Impression . . . . . . . The patient is tolerating radiation.  Plan . . . . . . . . . . . . Continue treatment as planned.  This document serves as a record of services personally performed by Tyler Pita, MD. It was created on his behalf by Darcus Austin, a trained medical scribe. The creation of this record is based on the scribe's personal observations and the provider's statements to them. This document has been checked and approved by the attending provider.       ________________________________  Destiny [name]. Tammi Klippel, M.D.

## 2014-08-25 NOTE — Telephone Encounter (Signed)
Patient was seen today in rad onc clinic for PUT with Dr. Tammi Klippel. Patient requested a refill of her Percocet. Patient was told by this RN to request Percocet refills from Dr. Julien Nordmann. Again, reinforced to the patient there is to be only one physician prescribing her pain medication. Patient verbalized understanding. Returned message left by Selena Lesser, NP to discuss "patient's pain medication management." Berniece Salines, NP reports she refilled the patient's Percocet today but, questions if Dr. Tammi Klippel should do so in the future since the patient is receiving radiation. Reinforced with Selena Lesser, NP patient has a contract with Dr. Julien Nordmann.

## 2014-08-28 ENCOUNTER — Other Ambulatory Visit (HOSPITAL_BASED_OUTPATIENT_CLINIC_OR_DEPARTMENT_OTHER): Payer: Medicaid Other

## 2014-08-28 ENCOUNTER — Ambulatory Visit
Admission: RE | Admit: 2014-08-28 | Discharge: 2014-08-28 | Disposition: A | Discharge status: Home / Self Care | Payer: Medicaid Other | Source: Ambulatory Visit | Attending: Radiation Oncology | Admitting: Radiation Oncology

## 2014-08-28 ENCOUNTER — Ambulatory Visit (HOSPITAL_BASED_OUTPATIENT_CLINIC_OR_DEPARTMENT_OTHER): Payer: Medicaid Other

## 2014-08-28 ENCOUNTER — Telehealth: Payer: Self-pay | Admitting: Internal Medicine

## 2014-08-28 ENCOUNTER — Encounter: Payer: Self-pay | Admitting: Internal Medicine

## 2014-08-28 ENCOUNTER — Ambulatory Visit (HOSPITAL_BASED_OUTPATIENT_CLINIC_OR_DEPARTMENT_OTHER): Payer: Medicaid Other | Admitting: Internal Medicine

## 2014-08-28 VITALS — BP 104/69 | HR 102 | Temp 97.9°F | Resp 19 | Ht 67.0 in | Wt 203.0 lb

## 2014-08-28 DIAGNOSIS — G8929 Other chronic pain: Secondary | ICD-10-CM | POA: Diagnosis not present

## 2014-08-28 DIAGNOSIS — C3411 Malignant neoplasm of upper lobe, right bronchus or lung: Secondary | ICD-10-CM

## 2014-08-28 DIAGNOSIS — K209 Esophagitis, unspecified: Secondary | ICD-10-CM | POA: Diagnosis not present

## 2014-08-28 DIAGNOSIS — C7931 Secondary malignant neoplasm of brain: Secondary | ICD-10-CM

## 2014-08-28 DIAGNOSIS — Z5111 Encounter for antineoplastic chemotherapy: Secondary | ICD-10-CM

## 2014-08-28 LAB — COMPREHENSIVE METABOLIC PANEL (CC13)
ALK PHOS: 79 U/L (ref 40–150)
ALT: 27 U/L (ref 0–55)
ANION GAP: 7 meq/L (ref 3–11)
AST: 19 U/L (ref 5–34)
Albumin: 3.7 g/dL (ref 3.5–5.0)
BILIRUBIN TOTAL: 0.27 mg/dL (ref 0.20–1.20)
BUN: 11.9 mg/dL (ref 7.0–26.0)
CO2: 26 mEq/L (ref 22–29)
CREATININE: 0.8 mg/dL (ref 0.6–1.1)
Calcium: 9.6 mg/dL (ref 8.4–10.4)
Chloride: 103 mEq/L (ref 98–109)
EGFR: >90 mL/min/{1.73_m2} (ref >90)
Glucose: 97 mg/dl (ref 70–140)
Potassium: 4.1 mEq/L (ref 3.5–5.1)
Sodium: 136 mEq/L (ref 136–145)
TOTAL PROTEIN: 7.2 g/dL (ref 6.4–8.3)

## 2014-08-28 LAB — CBC WITH DIFFERENTIAL/PLATELET
BASO%: 0.5 % (ref 0.0–2.0)
Basophils Absolute: 0 10*3/uL (ref 0.0–0.1)
EOS ABS: 0 10*3/uL (ref 0.0–0.5)
EOS%: 0.6 % (ref 0.0–7.0)
HCT: 37.7 % (ref 34.8–46.6)
HGB: 12.8 g/dL (ref 11.6–15.9)
LYMPH%: 16.3 % (ref 14.0–49.7)
MCH: 30.9 pg (ref 25.1–34.0)
MCHC: 33.9 g/dL (ref 31.5–36.0)
MCV: 91.3 fL (ref 79.5–101.0)
MONO#: 0.5 10*3/uL (ref 0.1–0.9)
MONO%: 11.1 % (ref 0.0–14.0)
NEUT#: 3.3 10*3/uL (ref 1.5–6.5)
NEUT%: 71.5 % (ref 38.4–76.8)
Platelets: 233 10*3/uL (ref 145–400)
RBC: 4.13 10*6/uL (ref 3.70–5.45)
RDW: 17.1 % — AB (ref 11.2–14.5)
WBC: 4.6 10*3/uL (ref 3.9–10.3)
lymph#: 0.7 10*3/uL — ABNORMAL LOW (ref 0.9–3.3)

## 2014-08-28 MED ORDER — DIPHENHYDRAMINE HCL 50 MG/ML IJ SOLN
50.0000 mg | Freq: Once | INTRAMUSCULAR | Status: AC
  Administered 2014-08-28: 50 mg via INTRAVENOUS

## 2014-08-28 MED ORDER — FAMOTIDINE IN NACL 20-0.9 MG/50ML-% IV SOLN
INTRAVENOUS | Status: AC
  Filled 2014-08-28: qty 50

## 2014-08-28 MED ORDER — SODIUM CHLORIDE 0.9 % IV SOLN
283.6000 mg | Freq: Once | INTRAVENOUS | Status: AC
  Administered 2014-08-28: 280 mg via INTRAVENOUS
  Filled 2014-08-28: qty 28

## 2014-08-28 MED ORDER — LIDOCAINE VISCOUS 2 % MT SOLN: 20.0000 mL | Freq: Four times a day (QID) | OROMUCOSAL | Status: DC | PRN

## 2014-08-28 MED ORDER — SODIUM CHLORIDE 0.9 % IV SOLN
Freq: Once | INTRAVENOUS | Status: AC
  Administered 2014-08-28: 11:00:00 via INTRAVENOUS

## 2014-08-28 MED ORDER — DIPHENHYDRAMINE HCL 50 MG/ML IJ SOLN
INTRAMUSCULAR | Status: AC
  Filled 2014-08-28: qty 1

## 2014-08-28 MED ORDER — PACLITAXEL CHEMO INJECTION 300 MG/50ML
45.0000 mg/m2 | Freq: Once | INTRAVENOUS | Status: AC
  Administered 2014-08-28: 90 mg via INTRAVENOUS
  Filled 2014-08-28: qty 15

## 2014-08-28 MED ORDER — SODIUM CHLORIDE 0.9 % IV SOLN
Freq: Once | INTRAVENOUS | Status: AC
  Administered 2014-08-28: 12:00:00 via INTRAVENOUS
  Filled 2014-08-28: qty 8

## 2014-08-28 MED ORDER — FAMOTIDINE IN NACL 20-0.9 MG/50ML-% IV SOLN
20.0000 mg | Freq: Once | INTRAVENOUS | Status: AC
  Administered 2014-08-28: 20 mg via INTRAVENOUS

## 2014-08-28 MED ORDER — MORPHINE SULFATE 4 MG/ML IJ SOLN
2.0000 mg | Freq: Once | INTRAMUSCULAR | Status: AC
  Administered 2014-08-28: 2 mg via INTRAVENOUS

## 2014-08-28 MED ORDER — HYDROCODONE-ACETAMINOPHEN 7.5-325 MG/15ML PO SOLN: 15.0000 mL | Freq: Four times a day (QID) | ORAL | Status: DC | PRN

## 2014-08-28 MED ORDER — OXYCODONE-ACETAMINOPHEN 5-325 MG PO TABS
ORAL_TABLET | ORAL | Status: AC
  Filled 2014-08-28: qty 2

## 2014-08-28 MED ORDER — OXYCODONE-ACETAMINOPHEN 5-325 MG PO TABS
2.0000 | ORAL_TABLET | Freq: Once | ORAL | Status: AC
Start: 2014-08-28 — End: 2014-08-28
  Administered 2014-08-28: 2 via ORAL

## 2014-08-28 MED ORDER — MORPHINE SULFATE 4 MG/ML IJ SOLN
INTRAMUSCULAR | Status: AC
  Filled 2014-08-28: qty 1

## 2014-08-28 NOTE — Progress Notes (Signed)
[place] [phone]   [phone] Multidisciplinary thoracic oncology clinic  OFFICE PROGRESS NOTE  [address], [place] [place] [address]  DIAGNOSIS: Stage IV (T3, N2, M1b) non-small cell lung cancer, adenocarcinoma with negative EGFR mutation and negative for gene translocation diagnosed in April 2016  PRIOR THERAPY: Stereotactic radiotherapy to the solitary brain lesion under the care of Dr. Tammi Klippel completed 07/03/2014.  CURRENT THERAPY: Concurrent chemoradiation with weekly carboplatin for AUC of 2 and paclitaxel 45 MG/M2. Status post 7 cycles.  INTERVAL HISTORY: Destiny [name] 44 y.o. female returns to the clinic today for follow-up visit follow-up visit accompanied by her son. She is tolerating her treatment with concurrent chemoradiation fairly well with no significant adverse effects except for the retrosternal pain from the radiation induced esophagitis. She is currently on Percocet 5/325 mg 2 tablets by mouth every 4 hours as needed for pain in addition to Viscous Lidocaine and Carafate. She is requesting refill of her pain medication and lidocaine. The patient denied having any significant fever or chills. She denied having any significant weight loss or night sweats. She has occasional nausea with no vomiting. She denied having any significant chest pain, shortness of breath, cough or hemoptysis. She is here today to start cycle #8 of her concurrent chemoradiation.  MEDICAL HISTORY: Past Medical History  Diagnosis Date  . Thyroid nodule   . Depression   . Suicide attempt   . SVT (supraventricular tachycardia)     a. Long RP tachycardia;  b. 01/2014 s/p RFCA.  . Lung cancer     ALLERGIES:  has No Known Allergies.  MEDICATIONS:  Current Outpatient Prescriptions  Medication Sig Dispense Refill  . Calcium Carbonate-Simethicone 1000-60 MG CHEW Chew 1 tablet by mouth every 6 (six) hours as needed  (discomfort in abdomen). 60 each 0  . dexamethasone (DECADRON) 1 MG tablet Take 2 mg by mouth 2 (two) times daily.  0  . dexamethasone (DECADRON) 4 MG tablet Take 1 tablet (4 mg total) by mouth as directed. Dexamethasone Taper:  Take 4 mg morning, noon, and night for 5 days, then, 4 mg morning and night for 10  days then 2 mg morning and night for 10 days, the 2 mg once daily for 10 days, then stop 30 tablet 1  . fluconazole (DIFLUCAN) 10 MG/ML suspension Take 10 mLs (100 mg total) by mouth daily. 100 mL 0  . fluconazole (DIFLUCAN) 100 MG tablet Take 100 mg by mouth daily.  0  . lidocaine (XYLOCAINE) 2 % solution Use as directed 20 mLs in the mouth or throat every 6 (six) hours as needed for mouth pain. 100 mL 0  . lisinopril-hydrochlorothiazide (PRINZIDE,ZESTORETIC) 10-12.5 MG per tablet Take 1 tablet by mouth daily.    [name] LORazepam (ATIVAN) 1 MG tablet Take 1 mg by mouth every 8 (eight) hours as needed for anxiety.   0  . mirtazapine (REMERON) 30 MG tablet Take 1 tablet (30 mg total) by mouth at bedtime. 30 tablet 2  . nicotine (NICODERM CQ) 7 mg/24hr patch Place 1 patch (7 mg total) onto the skin daily. 14 patch 0  . nystatin (MYCOSTATIN) 100000 UNIT/ML suspension Take 5 mLs (500,000 Units total) by mouth 4 (four) times daily. 60 mL 1  . omeprazole (PRILOSEC) 20 MG capsule Take 20 mg by mouth daily as needed (for heartburn).    . prochlorperazine (COMPAZINE) 10 MG tablet Take 1 tablet (10 mg  total) by mouth every 6 (six) hours as needed for nausea or vomiting. 30 tablet 0  . sucralfate (CARAFATE) 1 G tablet Take 1 tablet (1 g total) by mouth 4 (four) times daily -  with meals and at bedtime. 5 min before meals for radiation induced esophagitis 120 tablet 2  . traMADol (ULTRAM) 50 MG tablet Take 50 mg by mouth 2 (two) times daily.  0  . HYDROcodone-acetaminophen (HYCET) 7.5-325 mg/15 ml solution Take 15 mLs by mouth 4 (four) times daily as needed for moderate pain. 473 mL 0   No current  facility-administered medications for this visit.    SURGICAL HISTORY:  Past Surgical History  Procedure Laterality Date  . Thyroid surgery      Removed thyroid nodule, states still has thyroid; 2010  . Supraventricular tachycardia ablation N/A 01/16/2014    Procedure: SUPRAVENTRICULAR TACHYCARDIA ABLATION;  Surgeon: Evans Lance, MD;  Location: [facility] CATH LAB;  Service: Cardiovascular;  Laterality: N/A;  . Tee without cardioversion N/A 06/12/2014    Procedure: TRANSESOPHAGEAL ECHOCARDIOGRAM (TEE);  Surgeon: Sueanne Margarita, MD;  Location: [facility] ENDOSCOPY;  Service: Cardiovascular;  Laterality: N/A;    REVIEW OF SYSTEMS:  Constitutional: negative Eyes: negative Ears, nose, mouth, throat, and face: negative Respiratory: negative Cardiovascular: negative Gastrointestinal: positive for dysphagia and odynophagia Genitourinary:negative Integument/breast: negative Hematologic/lymphatic: negative Musculoskeletal:negative Neurological: negative Behavioral/Psych: negative Endocrine: negative Allergic/Immunologic: negative   PHYSICAL EXAMINATION: General appearance: alert, cooperative and no distress Head: Normocephalic, without obvious abnormality, atraumatic Neck: no adenopathy, no JVD, supple, symmetrical, trachea midline and thyroid not enlarged, symmetric, no tenderness/mass/nodules Lymph nodes: Cervical, supraclavicular, and axillary nodes normal. Resp: clear to auscultation bilaterally Back: symmetric, no curvature. ROM normal. No CVA tenderness. Cardio: regular rate and rhythm, S1, S2 normal, no murmur, click, rub or gallop GI: soft, non-tender; bowel sounds normal; no masses,  no organomegaly Extremities: extremities normal, atraumatic, no cyanosis or edema Neurologic: Alert and oriented X 3, normal strength and tone. Normal symmetric reflexes. Normal coordination and gait  ECOG PERFORMANCE STATUS: 1 - Symptomatic but completely ambulatory  Blood pressure 104/69, pulse 102,  temperature 97.9 F (36.6 C), temperature source Oral, resp. rate 19, height $RemoveBe'5\' 7"'nkhzolQyP$  (1.702 m), weight 203 lb (92.08 kg), SpO2 100 %.  LABORATORY DATA: Lab Results  Component Value Date   WBC 4.6 08/28/2014   HGB 12.8 08/28/2014   HCT 37.7 08/28/2014   MCV 91.3 08/28/2014   PLT 233 08/28/2014      Chemistry      Component Value Date/Time   NA 136 08/28/2014 0906   NA 132* 07/06/2014 0900   K 4.1 08/28/2014 0906   K 4.8 07/06/2014 0900   CL 100 07/06/2014 0900   CO2 26 08/28/2014 0906   CO2 25 07/06/2014 0900   BUN 11.9 08/28/2014 0906   BUN 27* 07/06/2014 0900   CREATININE 0.8 08/28/2014 0906   CREATININE 1.08 07/06/2014 0900      Component Value Date/Time   CALCIUM 9.6 08/28/2014 0906   CALCIUM 8.5 07/06/2014 0900   ALKPHOS 79 08/28/2014 0906   ALKPHOS 60 06/11/2014 0922   AST 19 08/28/2014 0906   AST 21 06/11/2014 0922   ALT 27 08/28/2014 0906   ALT 17 06/11/2014 0922   BILITOT 0.27 08/28/2014 0906   BILITOT 0.7 06/11/2014 0922       RADIOGRAPHIC STUDIES: No results found.  ASSESSMENT AND PLAN: This is a very pleasant 44 years old African-American female recently diagnosed with stage IV (T3, N2, M1b)  non-small cell lung cancer, adenocarcinoma with negative EGFR mutation and negative ALK gene translocation diagnosed in April 2016 and presenting with 2 pulmonary nodules in the right upper lobe as well as mediastinal lymphadenopathy and solitary brain lesion. The patient underwent stereotactic radiotherapy to the solitary brain lesion. She is currently undergoing a course of concurrent chemoradiation with weekly carboplatin and paclitaxel and tolerating her treatment well except for the odynophagia and dysphagia from the radiation induced esophagitis. I recommended for the patient to proceed with cycle #8 today as a scheduled. For pain management I change in her pain medication to hydrocodone 15 ML by mouth every 4 hours. I also given a refill of viscous lidocaine. I  would see the patient back for follow-up visit in 6 weeks after repeating CT scan of the chest for restaging of her disease. She was advised to call immediately if she has any concerning symptoms in the interval.  The patient voices understanding of current disease status and treatment options and is in agreement with the current care plan.  All questions were answered. The patient knows to call the clinic with any problems, questions or concerns. We can certainly see the patient much sooner if necessary.  Disclaimer: This note was dictated with voice recognition software. Similar sounding words can inadvertently be transcribed and may not be corrected upon review.

## 2014-08-28 NOTE — Patient Instructions (Signed)
[facility] Discharge Instructions for Patients Receiving Chemotherapy  Today you received the following chemotherapy agents Taxol and Carboplatin.  To help prevent nausea and vomiting after your treatment, we encourage you to take your nausea medication as prescribed.   If you develop nausea and vomiting that is not controlled by your nausea medication, call the clinic.   BELOW ARE SYMPTOMS THAT SHOULD BE REPORTED IMMEDIATELY:  *FEVER GREATER THAN 100.5 F  *CHILLS WITH OR WITHOUT FEVER  NAUSEA AND VOMITING THAT IS NOT CONTROLLED WITH YOUR NAUSEA MEDICATION  *UNUSUAL SHORTNESS OF BREATH  *UNUSUAL BRUISING OR BLEEDING  TENDERNESS IN MOUTH AND THROAT WITH OR WITHOUT PRESENCE OF ULCERS  *URINARY PROBLEMS  *BOWEL PROBLEMS  UNUSUAL RASH Items with * indicate a potential emergency and should be followed up as soon as possible.  Feel free to call the clinic you have any questions or concerns. The clinic phone number is [phone].  Please show the CHEMO ALERT CARD at check-in to the Emergency Department and triage nurse.   

## 2014-08-28 NOTE — Telephone Encounter (Signed)
Gave avs & calendar for August °

## 2014-08-29 ENCOUNTER — Ambulatory Visit
Admission: RE | Admit: 2014-08-29 | Discharge: 2014-08-29 | Disposition: A | Discharge status: Home / Self Care | Payer: Medicaid Other | Source: Ambulatory Visit | Attending: Radiation Oncology | Admitting: Radiation Oncology

## 2014-08-29 DIAGNOSIS — C3411 Malignant neoplasm of upper lobe, right bronchus or lung: Secondary | ICD-10-CM | POA: Diagnosis not present

## 2014-08-30 ENCOUNTER — Ambulatory Visit
Admission: RE | Admit: 2014-08-30 | Discharge: 2014-08-30 | Disposition: A | Discharge status: Home / Self Care | Payer: Medicaid Other | Source: Ambulatory Visit | Attending: Radiation Oncology | Admitting: Radiation Oncology

## 2014-08-30 DIAGNOSIS — C3411 Malignant neoplasm of upper lobe, right bronchus or lung: Secondary | ICD-10-CM | POA: Diagnosis not present

## 2014-08-31 ENCOUNTER — Telehealth: Payer: Self-pay | Admitting: *Deleted

## 2014-08-31 ENCOUNTER — Ambulatory Visit
Admission: RE | Admit: 2014-08-31 | Discharge: 2014-08-31 | Disposition: A | Discharge status: Home / Self Care | Payer: Medicaid Other | Source: Ambulatory Visit | Attending: Radiation Oncology | Admitting: Radiation Oncology

## 2014-08-31 ENCOUNTER — Ambulatory Visit: Payer: Medicaid Other

## 2014-08-31 DIAGNOSIS — C3411 Malignant neoplasm of upper lobe, right bronchus or lung: Secondary | ICD-10-CM | POA: Diagnosis not present

## 2014-08-31 NOTE — Telephone Encounter (Signed)
Received vm call from Destiny [name] that [name] needed refill on numbing med, stating that it was filled on Monday & she is out.  Requested call back to [phone].  Returned call & received vm.  Informed Dr Worthy Flank RN/Mary & she will ask Dr Worthy Flank advice tomorrow.

## 2014-09-01 ENCOUNTER — Telehealth: Payer: Self-pay | Admitting: *Deleted

## 2014-09-01 ENCOUNTER — Ambulatory Visit
Admission: RE | Admit: 2014-09-01 | Discharge: 2014-09-01 | Disposition: A | Discharge status: Home / Self Care | Payer: Medicaid Other | Source: Ambulatory Visit | Attending: Radiation Oncology | Admitting: Radiation Oncology

## 2014-09-01 ENCOUNTER — Encounter: Payer: Self-pay | Admitting: Radiation Oncology

## 2014-09-01 ENCOUNTER — Other Ambulatory Visit: Payer: Self-pay | Admitting: *Deleted

## 2014-09-01 ENCOUNTER — Ambulatory Visit: Payer: Medicaid Other

## 2014-09-01 VITALS — BP 136/90 | HR 100 | Resp 18 | Wt 204.4 lb

## 2014-09-01 DIAGNOSIS — C3411 Malignant neoplasm of upper lobe, right bronchus or lung: Secondary | ICD-10-CM

## 2014-09-01 MED ORDER — LIDOCAINE VISCOUS 2 % MT SOLN: 20.0000 mL | Freq: Four times a day (QID) | OROMUCOSAL | Status: DC | PRN

## 2014-09-01 MED ORDER — LIDOCAINE VISCOUS 2 % MT SOLN: 10.0000 mL | Freq: Four times a day (QID) | OROMUCOSAL | Status: DC | PRN

## 2014-09-01 MED ORDER — RADIAPLEXRX EX GEL
Freq: Once | CUTANEOUS | Status: AC
  Administered 2014-09-01: 12:00:00 via TOPICAL

## 2014-09-01 NOTE — Telephone Encounter (Signed)
rx for lidocaine refilled,  Called pt [phone] lmovm " rx is ready to be picked up "

## 2014-09-01 NOTE — Telephone Encounter (Signed)
Called pt spoke to son, gave message pt rx for lidocaine is at pt pharmacy. Pt verbalized understanding.

## 2014-09-01 NOTE — Progress Notes (Signed)
Weight and vitals stable. Hyperpigmentation of the center of her chest and back noted. Provided patient with second tube of radiaplex. Patient requesting refill of Lidocaine. Reports pain in the center of her chest 5 on a scale of 0-10. Reports decadron 1 mg once per day. No thrush noted. Reports pain and difficulty swallowing are less. Reports shortness of breath continues with exertion. One month follow up appointment card given.   BP 136/90 mmHg  Pulse 100  Resp 18  Wt 204 lb 6.4 oz (92.715 kg)  SpO2 100% Wt Readings from Last 3 Encounters:  09/01/14 204 lb 6.4 oz (92.715 kg)  08/28/14 203 lb (92.08 kg)  08/25/14 202 lb 6.4 oz (91.808 kg)

## 2014-09-01 NOTE — Progress Notes (Signed)
  Radiation Oncology         [phone] ________________________________  Name: Destiny [name] MRN: [id number]  Date: 09/01/2014  DOB: 1970-12-22  Weekly Radiation Therapy Management    ICD-9-CM ICD-10-CM   1. Primary cancer of right upper lobe of lung 162.3 C34.11 hyaluronate sodium (RADIAPLEXRX) gel     DISCONTINUED: lidocaine (XYLOCAINE) 2 % solution    Current Dose: 66 Gy     Planned Dose:  66 Gy  Narrative . . . . . . . . The patient presents for the final under treatment assessment. Weight and vitals stable. Hyperpigmentation of the center of her chest and back noted. The nurse provided the patient with a second tube of radiaplex. The patient is requesting a refill of Lidocaine. The patient reports pain in the center of her chest as a 5/10. Reports decadron 1 mg once per day. Reports pain and difficulty swallowing are less. Reports shortness of breath continues with exertion. One month follow up appointment card given. The patient has had some continuation of previously noted symptoms.                                 Set-up films were reviewed.                                 The chart was checked. Physical Findings. . . BP 136/90 mmHg  Pulse 100  Resp 18  Wt 204 lb 6.4 oz (92.715 kg)  SpO2 100%  Wt Readings from Last 3 Encounters:   09/01/14  204 lb 6.4 oz (92.715 kg)   08/28/14  203 lb (92.08 kg)   08/25/14  202 lb 6.4 oz (91.808 kg)   Weight essentially stable.  No significant changes. Impression . . . . . . . The patient tolerated radiation relatively well. Plan . . . . . . . . . . . . Complete radiation today as scheduled, and follow-up in one month.  I have ordered 3 refills of Lidocaine for the pt. The patient was encouraged to call or return to the clinic in the interim for any worsening symptoms.  This document serves as a record of services personally performed by Tyler Pita, MD. It was created on his behalf by Darcus Austin, a trained medical scribe. The creation  of this record is based on the scribe's personal observations and the provider's statements to them. This document has been checked and approved by the attending provider.     ________________________________  Sheral Apley. Tammi Klippel, M.D.

## 2014-09-04 ENCOUNTER — Other Ambulatory Visit: Payer: Self-pay | Admitting: *Deleted

## 2014-09-04 ENCOUNTER — Other Ambulatory Visit: Payer: Self-pay | Admitting: Medical Oncology

## 2014-09-04 ENCOUNTER — Telehealth: Payer: Self-pay | Admitting: Medical Oncology

## 2014-09-04 NOTE — Telephone Encounter (Signed)
I called pt . "She is out" .  Pt is not available. I left a message fro pt to return my call .

## 2014-09-04 NOTE — Telephone Encounter (Signed)
Received call from son Destiny [name] requesting refill of Hydrocodone/APAP 7.5 - 325 mg  [name]   Phone     [phone].

## 2014-09-04 NOTE — Telephone Encounter (Addendum)
Patient and son Destiny [name] called to "check status of refill.  Took last dose of Hycet at 8:00 this morning.  Starting to hurt, want to pick up prescription before close.  Can I take a Percocet since I'm starting to hurt."  Collaborative nurse notified and will call patient.

## 2014-09-05 ENCOUNTER — Telehealth: Payer: Self-pay | Admitting: Nurse Practitioner

## 2014-09-05 ENCOUNTER — Ambulatory Visit (HOSPITAL_BASED_OUTPATIENT_CLINIC_OR_DEPARTMENT_OTHER): Payer: Medicaid Other | Admitting: Nurse Practitioner

## 2014-09-05 ENCOUNTER — Ambulatory Visit: Payer: Medicaid Other

## 2014-09-05 ENCOUNTER — Telehealth: Payer: Self-pay

## 2014-09-05 ENCOUNTER — Ambulatory Visit (HOSPITAL_COMMUNITY)
Admission: RE | Admit: 2014-09-05 | Discharge: 2014-09-05 | Disposition: A | Discharge status: Home / Self Care | Payer: Medicaid Other | Source: Ambulatory Visit | Attending: Nurse Practitioner | Admitting: Nurse Practitioner

## 2014-09-05 ENCOUNTER — Other Ambulatory Visit: Payer: Self-pay | Admitting: Nurse Practitioner

## 2014-09-05 VITALS — BP 129/81 | HR 118 | Temp 99.0°F | Resp 20 | Wt 201.6 lb

## 2014-09-05 DIAGNOSIS — Z923 Personal history of irradiation: Secondary | ICD-10-CM | POA: Insufficient documentation

## 2014-09-05 DIAGNOSIS — C3411 Malignant neoplasm of upper lobe, right bronchus or lung: Secondary | ICD-10-CM | POA: Diagnosis not present

## 2014-09-05 DIAGNOSIS — B029 Zoster without complications: Secondary | ICD-10-CM | POA: Diagnosis not present

## 2014-09-05 DIAGNOSIS — R05 Cough: Secondary | ICD-10-CM

## 2014-09-05 DIAGNOSIS — G8929 Other chronic pain: Secondary | ICD-10-CM | POA: Diagnosis present

## 2014-09-05 DIAGNOSIS — C7931 Secondary malignant neoplasm of brain: Secondary | ICD-10-CM | POA: Diagnosis not present

## 2014-09-05 DIAGNOSIS — R0602 Shortness of breath: Secondary | ICD-10-CM | POA: Diagnosis not present

## 2014-09-05 MED ORDER — VALACYCLOVIR HCL 1 G PO TABS: 1000.0000 mg | ORAL_TABLET | Freq: Three times a day (TID) | ORAL | Status: DC

## 2014-09-05 MED ORDER — HYDROCODONE-ACETAMINOPHEN 5-325 MG PO TABS: 1.0000 | ORAL_TABLET | Freq: Four times a day (QID) | ORAL | Status: DC | PRN

## 2014-09-05 NOTE — Telephone Encounter (Addendum)
Pt needs hycet refilled. She has productive cough of thick yellow sputum. Chest hurts when she coughs. Denies fever. smc set up for 330 pm. Last labs on 6/20. Taxol carbo on 6/20.  No lab appt made for today.

## 2014-09-05 NOTE — Telephone Encounter (Signed)
Patient called reporting she "needs something for pain when arrive after the CXR.  I feel like crap and can't wait to fill the prescription."  Reports at 3:40 pm she has registered for xray to be performed.  Will notify [facility].

## 2014-09-05 NOTE — Telephone Encounter (Signed)
Called pt to inform her Selena Lesser, NP would like her to get a chest x-ray before visit. Pt states her ride should be there any minute to pick her up then she will head over to radiology and then check in at the cancer center when finished.

## 2014-09-05 NOTE — Telephone Encounter (Signed)
MD visit with NP/CB today per 06/28 POF, pt is aware of time.... KJ

## 2014-09-05 NOTE — Telephone Encounter (Signed)
Triage aske dme to evaluate refill for Hycet . I will defer to [place] .

## 2014-09-05 NOTE — Telephone Encounter (Signed)
Pt calling asking for status on something. Also mentioned a chest cold and hurts when she coughs.

## 2014-09-06 ENCOUNTER — Telehealth: Payer: Self-pay | Admitting: *Deleted

## 2014-09-06 ENCOUNTER — Encounter: Payer: Self-pay | Admitting: Nurse Practitioner

## 2014-09-06 DIAGNOSIS — B029 Zoster without complications: Secondary | ICD-10-CM | POA: Insufficient documentation

## 2014-09-06 NOTE — Assessment & Plan Note (Signed)
Patient has a history of chronic pain.  She was taking Percocet for her pain; but discontinued the percocet due to excessive drowsiness. She has most recently been taking Hycet Kulzer for control of a chronic dry cough and continued chest wall pain.  She states that she completely ran out of her cough syrup approximately 24 hours ago; and needs refill.  She continues to complain of a dry, nonproductive cough.  She denies any recent fevers or chills.  She is complaining of progressive pain to her right posterior scapular region at her previous radiation therapy site.  Long discussion with both patient and her son regarding the need to switch from a narcotic cough syrup to regular hydrocodone for better pain management.  Patient was given a new prescription for hydrocodone to take on an as-needed basis.  Confirmed the patient is not to take either Percocet or any further narcotic cough syrup while taking hydrocodone.  Also, will arrange for a in-home palliative care referral so they can manage her chronic pain issues.

## 2014-09-06 NOTE — Progress Notes (Signed)
SYMPTOM MANAGEMENT CLINIC   HPI: Destiny [name] 44 y.o. female diagnosed with lung cancer; with brain metastasis.  Patient received her last cycle of carboplatin/Taxol chemotherapy on 08/28/2014.  She completed her radiation therapy on 09/01/2014.  Patient has a history of chronic pain; was requesting a refill of her narcotic cough syrup today.  She is complaining of increasing pain to her right posterior scapular region over this past week or so.   Patient continues to complain of a chronic dry cough; but states that the cough is nonproductive.  She also denies any recent fevers or chills.  HPI  ROS  Past Medical History  Diagnosis Date  . Thyroid nodule   . Depression   . Suicide attempt   . SVT (supraventricular tachycardia)     a. Long RP tachycardia;  b. 01/2014 s/p RFCA.  . Lung cancer     Past Surgical History  Procedure Laterality Date  . Thyroid surgery      Removed thyroid nodule, states still has thyroid; 2010  . Supraventricular tachycardia ablation N/A 01/16/2014    Procedure: SUPRAVENTRICULAR TACHYCARDIA ABLATION;  Surgeon: Evans Lance, MD;  Location: [facility] CATH LAB;  Service: Cardiovascular;  Laterality: N/A;  . Tee without cardioversion N/A 06/12/2014    Procedure: TRANSESOPHAGEAL ECHOCARDIOGRAM (TEE);  Surgeon: Sueanne Margarita, MD;  Location: [facility] ENDOSCOPY;  Service: Cardiovascular;  Laterality: N/A;    has Paroxysmal supraventricular tachycardia; SVT (supraventricular tachycardia); Depression; GERD (gastroesophageal reflux disease); Hypertension; Hyperglycemia; Cigarette smoker; Poor dentition; Primary cancer of right upper lobe of lung; Lung nodules; Solitary brain metastasis; Chronic pain; Extremity edema; Esophagitis; Encounter for antineoplastic chemotherapy; and Shingles on her problem list.    has No Known Allergies.    Medication List       This list is accurate as of: 09/05/14 11:59 PM.  Always use your most recent med list.               Calcium Carbonate-Simethicone 1000-60 MG Chew  Chew 1 tablet by mouth every 6 (six) hours as needed (discomfort in abdomen).     dexamethasone 4 MG tablet  Commonly known as:  DECADRON  Take 1 tablet (4 mg total) by mouth as directed. Dexamethasone Taper:  Take 4 mg morning, noon, and night for 5 days, then, 4 mg morning and night for 10  days then 2 mg morning and night for 10 days, the 2 mg once daily for 10 days, then stop     dexamethasone 1 MG tablet  Commonly known as:  DECADRON  Take 2 mg by mouth 2 (two) times daily.     fluconazole 100 MG tablet  Commonly known as:  DIFLUCAN  Take 100 mg by mouth daily.     fluconazole 10 MG/ML suspension  Commonly known as:  DIFLUCAN  Take 10 mLs (100 mg total) by mouth daily.     HYDROcodone-acetaminophen 5-325 MG per tablet  Commonly known as:  NORCO/VICODIN  Take 1-2 tablets by mouth every 6 (six) hours as needed for moderate pain.     lidocaine 2 % solution  Commonly known as:  XYLOCAINE  Use as directed 10 mLs in the mouth or throat every 6 (six) hours as needed for mouth pain.     lisinopril-hydrochlorothiazide 10-12.5 MG per tablet  Commonly known as:  PRINZIDE,ZESTORETIC  Take 1 tablet by mouth daily.     LORazepam 1 MG tablet  Commonly known as:  ATIVAN  Take 1 mg by mouth every  8 (eight) hours as needed for anxiety.     mirtazapine 30 MG tablet  Commonly known as:  REMERON  Take 1 tablet (30 mg total) by mouth at bedtime.     nicotine 7 mg/24hr patch  Commonly known as:  NICODERM CQ  Place 1 patch (7 mg total) onto the skin daily.     nystatin 100000 UNIT/ML suspension  Commonly known as:  MYCOSTATIN  Take 5 mLs (500,000 Units total) by mouth 4 (four) times daily.     omeprazole 20 MG capsule  Commonly known as:  PRILOSEC  Take 20 mg by mouth daily as needed (for heartburn).     oxyCODONE-acetaminophen 5-325 MG per tablet  Commonly known as:  PERCOCET/ROXICET  take 1-2 tablets by mouth every 4 hours if needed  for SEVERE PAIN     pantoprazole 40 MG tablet  Commonly known as:  PROTONIX  Take 40 mg by mouth.     prochlorperazine 10 MG tablet  Commonly known as:  COMPAZINE  Take 1 tablet (10 mg total) by mouth every 6 (six) hours as needed for nausea or vomiting.     sucralfate 1 G tablet  Commonly known as:  CARAFATE  Take 1 tablet (1 g total) by mouth 4 (four) times daily -  with meals and at bedtime. 5 min before meals for radiation induced esophagitis     traMADol 50 MG tablet  Commonly known as:  ULTRAM  Take 50 mg by mouth 2 (two) times daily.     valACYclovir 1000 MG tablet  Commonly known as:  VALTREX  Take 1 tablet (1,000 mg total) by mouth 3 (three) times daily.         PHYSICAL EXAMINATION  Oncology Vitals 09/05/2014 09/01/2014 08/28/2014 08/25/2014 08/21/2014 08/17/2014 08/14/2014  Height - - 170 cm - - 170 cm -  Weight 91.445 kg 92.715 kg 92.08 kg 91.808 kg - 92.761 kg -  Weight (lbs) 201 lbs 10 oz 204 lbs 6 oz 203 lbs 202 lbs 6 oz - 204 lbs 8 oz -  BMI (kg/m2) - - 31.79 kg/m2 - - 32.03 kg/m2 -  Temp 99 - 97.9 - 97.7 98 98.2  Pulse 118 100 102 110 90 93 115  Resp _0 - 18  SpO2 98 100 100 - 100 100 100  BSA (m2) - - 2.09 m2 - - 2.09 m2 -   BP Readings from Last 3 Encounters:  09/05/14 129/81  09/01/14 136/90  08/28/14 104/69    Physical Exam  Constitutional: She is oriented to person, place, and time and well-developed, well-nourished, and in no distress.  HENT:  Head: Normocephalic and atraumatic.  Mouth/Throat: Oropharynx is clear and moist.  Eyes: Conjunctivae and EOM are normal. Pupils are equal, round, and reactive to light. Right eye exhibits no discharge. Left eye exhibits no discharge. No scleral icterus.  Neck: Normal range of motion. Neck supple. No JVD present. No tracheal deviation present. No thyromegaly present.  Cardiovascular: Normal rate, regular rhythm, normal heart sounds and intact distal pulses.   Pulmonary/Chest: Effort normal and breath  sounds normal. No stridor. No respiratory distress. She has no wheezes. She has no rales. She exhibits no tenderness.  Occasional dry cough only.  Abdominal: Soft. Bowel sounds are normal. She exhibits no distension and no mass. There is no tenderness. There is no rebound and no guarding.  Musculoskeletal: Normal range of motion. She exhibits no edema or tenderness.  Lymphadenopathy:  She has no cervical adenopathy.  Neurological: She is alert and oriented to person, place, and time. Gait normal.  Skin: Skin is warm and dry. Rash noted. No erythema. No pallor.  On exam.-Patient does have hyperpigmentation to the right shoulder/scapular region post radiation treatments.  No obvious radiation dermatitis noted; the patient is noted to have what appears to be a shingles rash directly over the radiation treatment site.  Area with several crusted over blister-like lesions which are very tender with any manipulation.  Was able to obtain a herpetic swab culture of the lesions.  Culture results pending.     Psychiatric:  Anxious and tearful.  Nursing note and vitals reviewed.   LABORATORY DATA:. No visits with results within 3 Day(s) from this visit. Latest known visit with results is:  Appointment on 08/28/2014  Component Date Value Ref Range Status  . WBC 08/28/2014 4.6  3.9 - 10.3 10e3/uL Final  . NEUT# 08/28/2014 3.3  1.5 - 6.5 10e3/uL Final  . HGB 08/28/2014 12.8  11.6 - 15.9 g/dL Final  . HCT 08/28/2014 37.7  34.8 - 46.6 % Final  . Platelets 08/28/2014 233  145 - 400 10e3/uL Final  . MCV 08/28/2014 91.3  79.5 - 101.0 fL Final  . MCH 08/28/2014 30.9  25.1 - 34.0 pg Final  . MCHC 08/28/2014 33.9  31.5 - 36.0 g/dL Final  . RBC 08/28/2014 4.13  3.70 - 5.45 10e6/uL Final  . RDW 08/28/2014 17.1* 11.2 - 14.5 % Final  . lymph# 08/28/2014 0.7* 0.9 - 3.3 10e3/uL Final  . MONO# 08/28/2014 0.5  0.1 - 0.9 10e3/uL Final  . Eosinophils Absolute 08/28/2014 0.0  0.0 - 0.5 10e3/uL Final  . Basophils  Absolute 08/28/2014 0.0  0.0 - 0.1 10e3/uL Final  . NEUT% 08/28/2014 71.5  38.4 - 76.8 % Final  . LYMPH% 08/28/2014 16.3  14.0 - 49.7 % Final  . MONO% 08/28/2014 11.1  0.0 - 14.0 % Final  . EOS% 08/28/2014 0.6  0.0 - 7.0 % Final  . BASO% 08/28/2014 0.5  0.0 - 2.0 % Final  . Sodium 08/28/2014 136  136 - 145 mEq/L Final  . Potassium 08/28/2014 4.1  3.5 - 5.1 mEq/L Final  . Chloride 08/28/2014 103  98 - 109 mEq/L Final  . CO2 08/28/2014 26  22 - 29 mEq/L Final  . Glucose 08/28/2014 97  70 - 140 mg/dl Final  . BUN 08/28/2014 11.9  7.0 - 26.0 mg/dL Final  . Creatinine 08/28/2014 0.8  0.6 - 1.1 mg/dL Final  . Total Bilirubin 08/28/2014 0.27  0.20 - 1.20 mg/dL Final  . Alkaline Phosphatase 08/28/2014 79  40 - 150 U/L Final  . AST 08/28/2014 19  5 - 34 U/L Final  . ALT 08/28/2014 27  0 - 55 U/L Final  . Total Protein 08/28/2014 7.2  6.4 - 8.3 g/dL Final  . Albumin 08/28/2014 3.7  3.5 - 5.0 g/dL Final  . Calcium 08/28/2014 9.6  8.4 - 10.4 mg/dL Final  . Anion Gap 08/28/2014 7  3 - 11 mEq/L Final  . EGFR 08/28/2014 >90  >90 ml/min/1.73 m2 Final   eGFR is calculated using the CKD-EPI Creatinine Equation (2009)     RADIOGRAPHIC STUDIES: Dg Chest 2 View  09/05/2014   CLINICAL DATA:  History of lung carcinoma, currently under treatment with radiation, productive cough and increasing shortness of breath  EXAM: CHEST  2 VIEW  COMPARISON:  PET-CT of 06/23/2014, chest x-ray of 06/14/2014, and chest x-ray  of 12/08/2013  FINDINGS: The nodular areas in the right mid lung noted on CT of the chest appear less prominent presumably in response to the therapy. No infiltrate or effusion is seen and no new lung nodule is noted. Mediastinal and hilar contours are unchanged. The heart is within normal limits in size. No bony abnormality is seen.  IMPRESSION: The nodules in the right mid lung are less prominent most likely in response to interval treatment. No new nodular opacity is seen.   Electronically Signed   By:  Ivar Drape M.D.   On: 09/05/2014 16:13    ASSESSMENT/PLAN:    Chronic pain Patient has a history of chronic pain.  She was taking Percocet for her pain; but discontinued the percocet due to excessive drowsiness. She has most recently been taking Hycet Kulzer for control of a chronic dry cough and continued chest wall pain.  She states that she completely ran out of her cough syrup approximately 24 hours ago; and needs refill.  She continues to complain of a dry, nonproductive cough.  She denies any recent fevers or chills.  She is complaining of progressive pain to her right posterior scapular region at her previous radiation therapy site.  Long discussion with both patient and her son regarding the need to switch from a narcotic cough syrup to regular hydrocodone for better pain management.  Patient was given a new prescription for hydrocodone to take on an as-needed basis.  Confirmed the patient is not to take either Percocet or any further narcotic cough syrup while taking hydrocodone.  Also, will arrange for a in-home palliative care referral so they can manage her chronic pain issues.    Primary cancer of right upper lobe of lung Patient received cycle 8 of her carboplatin/Taxol chemotherapy on 08/28/2014.  Patient completed her last radiation treatment on 09/01/2014.  She is scheduled for a restaging brain MRI on 10/03/2014.  She is scheduled for restaging CT of the chest on 10/06/2014.  She is scheduled for labs only on 10/10/2014.  She is scheduled for follow-up visit here at the [facility] on 10/17/2014.  Shingles Patient has a history of chronic pain; was requesting a refill of her narcotic cough syrup today.  She is complaining of increasing pain to her right posterior scapular region over this past week or so.  On exam.-Patient does have hyperpigmentation to the right shoulder/scapular region post radiation treatments.  No obvious radiation dermatitis noted; the patient is noted to  have what appears to be a shingles rash directly over the radiation treatment site.  Area with several crusted over blister-like lesions which are very tender with any manipulation.  Was able to obtain a herpetic swab culture of the lesions.  Culture results pending.  Also, bilateral lung sounds clear with no wheezing noted.  Occasional dry cough only on exam.  No acute respiratory issues whatsoever.  Chest x-ray obtained today was stable; with no acute findings.  Most likely, recent onset of shingles causing increased pain to the right posterior shoulder/scapular region.  Patient was prescribed valacyclovir for treatment of the shingles.  Patient was also given a prescription for hydrocodone to take on an as needed basis as well.   Patient stated understanding of all instructions; and was in agreement with this plan of care. The patient knows to call the clinic with any problems, questions or concerns.   Review/collaboration with Dr. Julien Nordmann and Dr. Tammi Klippel regarding all aspects of patient's visit today.   Total time  spent with patient was 40 minutes;  with greater than 75 percent of that time spent in face to face counseling regarding patient's symptoms,  and coordination of care and follow up.  Disclaimer: This note was dictated with voice recognition software. Similar sounding words can inadvertently be transcribed and may not be corrected upon review.   Drue Second, NP 09/06/2014

## 2014-09-06 NOTE — Progress Notes (Signed)
Addendum

## 2014-09-06 NOTE — Assessment & Plan Note (Signed)
Patient received cycle 8 of her carboplatin/Taxol chemotherapy on 08/28/2014.  Patient completed her last radiation treatment on 09/01/2014.  She is scheduled for a restaging brain MRI on 10/03/2014.  She is scheduled for restaging CT of the chest on 10/06/2014.  She is scheduled for labs only on 10/10/2014.  She is scheduled for follow-up visit here at the [facility] on 10/17/2014.

## 2014-09-06 NOTE — Telephone Encounter (Signed)
Spoke to Hospice and Palliative care to provide pt information for a home palliative care evaluation.

## 2014-09-06 NOTE — Assessment & Plan Note (Addendum)
Patient has a history of chronic pain; was requesting a refill of her narcotic cough syrup today.  She is complaining of increasing pain to her right posterior scapular region over this past week or so.  On exam.-Patient does have hyperpigmentation to the right shoulder/scapular region post radiation treatments.  No obvious radiation dermatitis noted; the patient is noted to have what appears to be a shingles rash directly over the radiation treatment site.  Area with several crusted over blister-like lesions which are very tender with any manipulation.  Was able to obtain a herpetic swab culture of the lesions.  Culture results pending.  Also, bilateral lung sounds clear with no wheezing noted.  Occasional dry cough only on exam.  No acute respiratory issues whatsoever.  Chest x-ray obtained today was stable; with no acute findings.  Most likely, recent onset of shingles causing increased pain to the right posterior shoulder/scapular region.  Patient was prescribed valacyclovir for treatment of the shingles.  Patient was also given a prescription for hydrocodone to take on an as needed basis as well.

## 2014-09-07 LAB — HERPES SIMPLEX VIRUS CULTURE

## 2014-09-08 ENCOUNTER — Other Ambulatory Visit: Payer: Self-pay | Admitting: *Deleted

## 2014-09-08 ENCOUNTER — Encounter (HOSPITAL_COMMUNITY): Payer: Self-pay | Admitting: Neurology

## 2014-09-08 ENCOUNTER — Telehealth: Payer: Self-pay | Admitting: *Deleted

## 2014-09-08 ENCOUNTER — Emergency Department (HOSPITAL_COMMUNITY): Payer: Medicaid Other

## 2014-09-08 ENCOUNTER — Other Ambulatory Visit: Payer: Self-pay

## 2014-09-08 ENCOUNTER — Inpatient Hospital Stay (HOSPITAL_COMMUNITY)
Admission: EM | Admit: 2014-09-08 | Discharge: 2014-09-11 | DRG: 194 | Disposition: A | Discharge status: Home / Self Care | Payer: Medicaid Other | Attending: Internal Medicine | Admitting: Internal Medicine

## 2014-09-08 DIAGNOSIS — I471 Supraventricular tachycardia: Secondary | ICD-10-CM

## 2014-09-08 DIAGNOSIS — I10 Essential (primary) hypertension: Secondary | ICD-10-CM | POA: Diagnosis present

## 2014-09-08 DIAGNOSIS — Z923 Personal history of irradiation: Secondary | ICD-10-CM | POA: Diagnosis not present

## 2014-09-08 DIAGNOSIS — F329 Major depressive disorder, single episode, unspecified: Secondary | ICD-10-CM | POA: Diagnosis present

## 2014-09-08 DIAGNOSIS — Z9221 Personal history of antineoplastic chemotherapy: Secondary | ICD-10-CM

## 2014-09-08 DIAGNOSIS — D899 Disorder involving the immune mechanism, unspecified: Secondary | ICD-10-CM | POA: Diagnosis present

## 2014-09-08 DIAGNOSIS — J189 Pneumonia, unspecified organism: Secondary | ICD-10-CM | POA: Diagnosis present

## 2014-09-08 DIAGNOSIS — E871 Hypo-osmolality and hyponatremia: Secondary | ICD-10-CM | POA: Diagnosis present

## 2014-09-08 DIAGNOSIS — C3411 Malignant neoplasm of upper lobe, right bronchus or lung: Secondary | ICD-10-CM

## 2014-09-08 DIAGNOSIS — Y95 Nosocomial condition: Secondary | ICD-10-CM | POA: Diagnosis present

## 2014-09-08 DIAGNOSIS — K209 Esophagitis, unspecified without bleeding: Secondary | ICD-10-CM | POA: Insufficient documentation

## 2014-09-08 DIAGNOSIS — R05 Cough: Secondary | ICD-10-CM | POA: Insufficient documentation

## 2014-09-08 DIAGNOSIS — F17210 Nicotine dependence, cigarettes, uncomplicated: Secondary | ICD-10-CM | POA: Diagnosis present

## 2014-09-08 DIAGNOSIS — E860 Dehydration: Secondary | ICD-10-CM | POA: Diagnosis present

## 2014-09-08 DIAGNOSIS — K208 Other esophagitis: Secondary | ICD-10-CM | POA: Diagnosis present

## 2014-09-08 DIAGNOSIS — R059 Cough, unspecified: Secondary | ICD-10-CM | POA: Insufficient documentation

## 2014-09-08 DIAGNOSIS — R1310 Dysphagia, unspecified: Secondary | ICD-10-CM | POA: Insufficient documentation

## 2014-09-08 DIAGNOSIS — Z8249 Family history of ischemic heart disease and other diseases of the circulatory system: Secondary | ICD-10-CM | POA: Diagnosis not present

## 2014-09-08 DIAGNOSIS — Z800 Family history of malignant neoplasm of digestive organs: Secondary | ICD-10-CM

## 2014-09-08 DIAGNOSIS — R0789 Other chest pain: Secondary | ICD-10-CM | POA: Diagnosis not present

## 2014-09-08 DIAGNOSIS — D649 Anemia, unspecified: Secondary | ICD-10-CM | POA: Diagnosis present

## 2014-09-08 DIAGNOSIS — Z720 Tobacco use: Secondary | ICD-10-CM

## 2014-09-08 DIAGNOSIS — R000 Tachycardia, unspecified: Secondary | ICD-10-CM | POA: Diagnosis present

## 2014-09-08 DIAGNOSIS — B3781 Candidal esophagitis: Secondary | ICD-10-CM | POA: Diagnosis present

## 2014-09-08 LAB — URINALYSIS, ROUTINE W REFLEX MICROSCOPIC
GLUCOSE, UA: NEGATIVE mg/dL
Hgb urine dipstick: NEGATIVE
Ketones, ur: >80 mg/dL — AB
Leukocytes, UA: NEGATIVE
NITRITE: NEGATIVE
PH: 6 (ref 5.0–8.0)
PROTEIN: NEGATIVE mg/dL
Specific Gravity, Urine: 1.03 (ref 1.005–1.030)
Urobilinogen, UA: 1 mg/dL (ref 0.0–1.0)

## 2014-09-08 LAB — CBC WITH DIFFERENTIAL/PLATELET
Basophils Absolute: 0 10*3/uL (ref 0.0–0.1)
Basophils Relative: 0 % (ref 0–1)
Eosinophils Absolute: 0 10*3/uL (ref 0.0–0.7)
Eosinophils Relative: 0 % (ref 0–5)
HCT: 35.9 % — ABNORMAL LOW (ref 36.0–46.0)
Hemoglobin: 12.7 g/dL (ref 12.0–15.0)
Lymphocytes Relative: 8 % — ABNORMAL LOW (ref 12–46)
Lymphs Abs: 0.6 10*3/uL — ABNORMAL LOW (ref 0.7–4.0)
MCH: 30.9 pg (ref 26.0–34.0)
MCHC: 35.4 g/dL (ref 30.0–36.0)
MCV: 87.3 fL (ref 78.0–100.0)
MONO ABS: 0.6 10*3/uL (ref 0.1–1.0)
Monocytes Relative: 8 % (ref 3–12)
Neutro Abs: 6.6 10*3/uL (ref 1.7–7.7)
Neutrophils Relative %: 84 % — ABNORMAL HIGH (ref 43–77)
Platelets: 203 10*3/uL (ref 150–400)
RBC: 4.11 MIL/uL (ref 3.87–5.11)
RDW: 15.6 % — AB (ref 11.5–15.5)
WBC: 7.9 10*3/uL (ref 4.0–10.5)

## 2014-09-08 LAB — COMPREHENSIVE METABOLIC PANEL
ALBUMIN: 3.8 g/dL (ref 3.5–5.0)
ALK PHOS: 83 U/L (ref 38–126)
ALT: 33 U/L (ref 14–54)
AST: 29 U/L (ref 15–41)
Anion gap: 13 (ref 5–15)
BILIRUBIN TOTAL: 0.4 mg/dL (ref 0.3–1.2)
BUN: 11 mg/dL (ref 6–20)
CALCIUM: 9.4 mg/dL (ref 8.9–10.3)
CHLORIDE: 95 mmol/L — AB (ref 101–111)
CO2: 24 mmol/L (ref 22–32)
Creatinine, Ser: 0.88 mg/dL (ref 0.44–1.00)
GFR calc Af Amer: >60 mL/min (ref >60)
GFR calc non Af Amer: >60 mL/min (ref >60)
GLUCOSE: 108 mg/dL — AB (ref 65–99)
Potassium: 3.6 mmol/L (ref 3.5–5.1)
SODIUM: 132 mmol/L — AB (ref 135–145)
TOTAL PROTEIN: 7.9 g/dL (ref 6.5–8.1)

## 2014-09-08 LAB — I-STAT CG4 LACTIC ACID, ED
Lactic Acid, Venous: 1.05 mmol/L (ref 0.5–2.0)
Lactic Acid, Venous: 0.71 mmol/L (ref 0.5–2.0)

## 2014-09-08 MED ORDER — LEVOFLOXACIN IN D5W 750 MG/150ML IV SOLN
750.0000 mg | INTRAVENOUS | Status: DC
  Administered 2014-09-08: 750 mg via INTRAVENOUS
  Filled 2014-09-08: qty 150

## 2014-09-08 MED ORDER — SODIUM CHLORIDE 0.9 % IV SOLN
INTRAVENOUS | Status: DC
Start: 2014-09-08 — End: 2014-09-09
  Administered 2014-09-08: 23:00:00 via INTRAVENOUS

## 2014-09-08 MED ORDER — LIDOCAINE VISCOUS 2 % MT SOLN: 10.0000 mL | Freq: Four times a day (QID) | OROMUCOSAL | Status: DC | PRN

## 2014-09-08 MED ORDER — ALUM & MAG HYDROXIDE-SIMETH 200-200-20 MG/5ML PO SUSP
30.0000 mL | Freq: Four times a day (QID) | ORAL | Status: DC | PRN
  Administered 2014-09-09 – 2014-09-10 (×3): 30 mL via ORAL
  Filled 2014-09-08 (×3): qty 30

## 2014-09-08 MED ORDER — SIMETHICONE 80 MG PO CHEW
80.0000 mg | CHEWABLE_TABLET | Freq: Four times a day (QID) | ORAL | Status: DC | PRN
  Administered 2014-09-09: 80 mg via ORAL
  Filled 2014-09-08 (×3): qty 1

## 2014-09-08 MED ORDER — LIDOCAINE-PRILOCAINE 2.5-2.5 % EX CREA
TOPICAL_CREAM | Freq: Two times a day (BID) | CUTANEOUS | Status: DC | PRN
  Administered 2014-09-09: 18:00:00 via TOPICAL
  Filled 2014-09-08: qty 5

## 2014-09-08 MED ORDER — ACETAMINOPHEN 650 MG RE SUPP: 650.0000 mg | Freq: Four times a day (QID) | RECTAL | Status: DC | PRN

## 2014-09-08 MED ORDER — SODIUM CHLORIDE 0.9 % IV BOLUS (SEPSIS)
1000.0000 mL | Freq: Once | INTRAVENOUS | Status: AC
  Administered 2014-09-08: 1000 mL via INTRAVENOUS

## 2014-09-08 MED ORDER — SODIUM CHLORIDE 0.9 % IV BOLUS (SEPSIS): 1000.0000 mL | Freq: Once | INTRAVENOUS | Status: DC

## 2014-09-08 MED ORDER — ONDANSETRON HCL 4 MG PO TABS
4.0000 mg | ORAL_TABLET | Freq: Four times a day (QID) | ORAL | Status: DC | PRN
Start: 2014-09-08 — End: 2014-09-11

## 2014-09-08 MED ORDER — ACETAMINOPHEN 325 MG PO TABS
650.0000 mg | ORAL_TABLET | Freq: Four times a day (QID) | ORAL | Status: DC | PRN
Start: 2014-09-08 — End: 2014-09-11

## 2014-09-08 MED ORDER — LIDOCAINE VISCOUS 2 % MT SOLN
10.0000 mL | Freq: Four times a day (QID) | OROMUCOSAL | Status: DC | PRN
  Administered 2014-09-08 – 2014-09-10 (×5): 10 mL via OROMUCOSAL
  Filled 2014-09-08 (×7): qty 10

## 2014-09-08 MED ORDER — OXYCODONE HCL 5 MG PO TABS
5.0000 mg | ORAL_TABLET | ORAL | Status: DC | PRN
  Administered 2014-09-09 – 2014-09-11 (×8): 5 mg via ORAL
  Filled 2014-09-08 (×8): qty 1

## 2014-09-08 MED ORDER — DEXAMETHASONE 2 MG PO TABS
2.0000 mg | ORAL_TABLET | Freq: Every day | ORAL | Status: DC
  Administered 2014-09-09 – 2014-09-11 (×3): 2 mg via ORAL
  Filled 2014-09-08 (×5): qty 1

## 2014-09-08 MED ORDER — ONDANSETRON HCL 4 MG/2ML IJ SOLN: 4.0000 mg | Freq: Four times a day (QID) | INTRAMUSCULAR | Status: DC | PRN

## 2014-09-08 MED ORDER — FENTANYL CITRATE (PF) 100 MCG/2ML IJ SOLN
50.0000 ug | Freq: Once | INTRAMUSCULAR | Status: AC
  Administered 2014-09-08: 50 ug via INTRAVENOUS
  Filled 2014-09-08: qty 2

## 2014-09-08 MED ORDER — PANTOPRAZOLE SODIUM 40 MG PO TBEC
40.0000 mg | DELAYED_RELEASE_TABLET | Freq: Two times a day (BID) | ORAL | Status: DC
  Administered 2014-09-09 – 2014-09-11 (×5): 40 mg via ORAL
  Filled 2014-09-08 (×5): qty 1

## 2014-09-08 MED ORDER — HYDROMORPHONE HCL 1 MG/ML IJ SOLN
0.5000 mg | INTRAMUSCULAR | Status: DC | PRN
  Administered 2014-09-08 – 2014-09-11 (×11): 1 mg via INTRAVENOUS
  Filled 2014-09-08 (×11): qty 1

## 2014-09-08 MED ORDER — LORAZEPAM 1 MG PO TABS
1.0000 mg | ORAL_TABLET | Freq: Three times a day (TID) | ORAL | Status: DC | PRN
  Administered 2014-09-09 – 2014-09-10 (×2): 1 mg via ORAL
  Filled 2014-09-08 (×2): qty 1

## 2014-09-08 MED ORDER — ACETAMINOPHEN 325 MG PO TABS
650.0000 mg | ORAL_TABLET | Freq: Once | ORAL | Status: AC
  Administered 2014-09-08: 650 mg via ORAL
  Filled 2014-09-08: qty 2

## 2014-09-08 MED ORDER — CALCIUM CARBONATE-SIMETHICONE 1000-60 MG PO CHEW: 1.0000 | CHEWABLE_TABLET | Freq: Four times a day (QID) | ORAL | Status: DC | PRN

## 2014-09-08 MED ORDER — MIRTAZAPINE 30 MG PO TABS
30.0000 mg | ORAL_TABLET | Freq: Every day | ORAL | Status: DC
  Administered 2014-09-08 – 2014-09-10 (×3): 30 mg via ORAL
  Filled 2014-09-08 (×4): qty 1

## 2014-09-08 MED ORDER — GI COCKTAIL ~~LOC~~
30.0000 mL | Freq: Once | ORAL | Status: AC
  Administered 2014-09-08: 30 mL via ORAL
  Filled 2014-09-08: qty 30

## 2014-09-08 MED ORDER — LEVOFLOXACIN IN D5W 750 MG/150ML IV SOLN: 750.0000 mg | INTRAVENOUS | Status: DC

## 2014-09-08 MED ORDER — ENOXAPARIN SODIUM 40 MG/0.4ML ~~LOC~~ SOLN
40.0000 mg | SUBCUTANEOUS | Status: DC
  Administered 2014-09-08 – 2014-09-10 (×3): 40 mg via SUBCUTANEOUS
  Filled 2014-09-08 (×4): qty 0.4

## 2014-09-08 MED ORDER — ALBUTEROL SULFATE (2.5 MG/3ML) 0.083% IN NEBU: 2.5000 mg | INHALATION_SOLUTION | Freq: Four times a day (QID) | RESPIRATORY_TRACT | Status: DC | PRN

## 2014-09-08 MED ORDER — CALCIUM CARBONATE ANTACID 500 MG PO CHEW
2.0000 | CHEWABLE_TABLET | Freq: Four times a day (QID) | ORAL | Status: DC | PRN
  Filled 2014-09-08: qty 2

## 2014-09-08 MED ORDER — SODIUM CHLORIDE 0.9 % IV BOLUS (SEPSIS)
1000.0000 mL | Freq: Once | INTRAVENOUS | Status: AC
Start: 2014-09-08 — End: 2014-09-08
  Administered 2014-09-08: 1000 mL via INTRAVENOUS

## 2014-09-08 MED ORDER — VALACYCLOVIR HCL 500 MG PO TABS: 1000.0000 mg | ORAL_TABLET | Freq: Three times a day (TID) | ORAL | Status: DC

## 2014-09-08 MED ORDER — LEVOFLOXACIN IN D5W 750 MG/150ML IV SOLN
750.0000 mg | INTRAVENOUS | Status: DC
  Administered 2014-09-09 – 2014-09-10 (×2): 750 mg via INTRAVENOUS
  Filled 2014-09-08 (×3): qty 150

## 2014-09-08 MED ORDER — SODIUM CHLORIDE 0.9 % IV BOLUS (SEPSIS)
500.0000 mL | Freq: Once | INTRAVENOUS | Status: AC
  Administered 2014-09-08: 500 mL via INTRAVENOUS

## 2014-09-08 NOTE — Telephone Encounter (Signed)
Called pharmacy to verify pt has picked up [place] script along with the Lidocaine prescription. Pt also recently picked up a script for Oxycodone.  Pt son called triage with concerns that pt mouth continues to cause increase pain.

## 2014-09-08 NOTE — ED Notes (Signed)
Pt states she attempted to drink water but continues to experience discomfort with swallowing and requesting more "numbing medicine"; pt told that she must wait period of time for re-dosing if MD allows. Linker, MD made aware.

## 2014-09-08 NOTE — Telephone Encounter (Signed)
Palliative NP recently seen patient. Informed of patient temperature 100.8. Selena Lesser NP notified. Patient was instructed to go to the Emergency room for further treatment. Nurse Practitioner voiced understanding and stated that information was going to given to patient.

## 2014-09-08 NOTE — Progress Notes (Signed)
UR COMPLETED  

## 2014-09-08 NOTE — H&P (Signed)
[facility] Admission History and Physical       [name] ZOX:[id number] DOB: Nov 24, 1970 DOA: 09/08/2014  Referring physician: EDP PCP: Destiny Lyme, FNP  Specialists:   Chief Complaint: Fever Chills  HPI: Destiny [name] is a 44 y.o. female with a history of Lung Cancer currently undergoing Radiation RX who presents to the ED with complaints of Fevers, chills and nonproductive Cough  And poor appetite x 3 days.   She reports increased pain of her chest wall due to radiation treatments.  She was evaluated in the ED and found to have Findings of  Pneumonia on Chest X-ray and was placed on IV Levaquin and referred for medical admission.     Review of Systems:  Constitutional: No Weight Loss, No Weight Gain, Night Sweats, +Fevers, +Chills, Dizziness, Light Headedness, Fatigue, or Generalized Weakness HEENT: No Headaches, Difficulty Swallowing,Tooth/Dental Problems,Sore Throat,  No Sneezing, Rhinitis, Ear Ache, Nasal Congestion, or Post Nasal Drip,  Cardio-vascular:  +Chest Wall pain, Orthopnea, PND, Edema in Lower Extremities, Anasarca, Dizziness, Palpitations  Resp: No Dyspnea, No DOE, +Non-Productive Cough, No Hemoptysis, No Wheezing.    GI: No Heartburn, Indigestion, Abdominal Pain, Nausea, Vomiting, Diarrhea, Constipation, Hematemesis, Hematochezia, Melena, Change in Bowel Habits,  +Loss of Appetite  GU: No Dysuria, No Change in Color of Urine, No Urgency or Urinary Frequency, No Flank pain.  Musculoskeletal: No Joint Pain or Swelling, No Decreased Range of Motion, No Back Pain.  Neurologic: No Syncope, No Seizures, Muscle Weakness, Paresthesia, Vision Disturbance or Loss, No Diplopia, No Vertigo, No Difficulty Walking,  Skin: No Rash or Lesions. Psych: No Change in Mood or Affect, No Depression or Anxiety, No Memory loss, No Confusion, or Hallucinations   Past Medical History  Diagnosis Date  . Thyroid nodule   . Depression   . Suicide attempt   . SVT  (supraventricular tachycardia)     a. Long RP tachycardia;  b. 01/2014 s/p RFCA.  . Lung cancer      Past Surgical History  Procedure Laterality Date  . Thyroid surgery      Removed thyroid nodule, states still has thyroid; 2010  . Supraventricular tachycardia ablation N/A 01/16/2014    Procedure: SUPRAVENTRICULAR TACHYCARDIA ABLATION;  Surgeon: Evans Lance, MD;  Location: [facility] CATH LAB;  Service: Cardiovascular;  Laterality: N/A;  . Tee without cardioversion N/A 06/12/2014    Procedure: TRANSESOPHAGEAL ECHOCARDIOGRAM (TEE);  Surgeon: Sueanne Margarita, MD;  Location: [facility] ENDOSCOPY;  Service: Cardiovascular;  Laterality: N/A;      Prior to Admission medications   Medication Sig Start Date End Date Taking? Authorizing Provider  dexamethasone (DECADRON) 1 MG tablet Take 2 mg by mouth daily with breakfast.  07/12/14  Yes Historical Provider, MD  HYDROcodone-acetaminophen (NORCO/VICODIN) 5-325 MG per tablet Take 1-2 tablets by mouth every 6 (six) hours as needed for moderate pain. 09/05/14  Yes Susanne Borders, NP  lisinopril-hydrochlorothiazide (PRINZIDE,ZESTORETIC) 10-12.5 MG per tablet Take 1 tablet by mouth daily.   Yes Historical Provider, MD  LORazepam (ATIVAN) 1 MG tablet Take 1 mg by mouth every 8 (eight) hours as needed for anxiety.  06/19/14  Yes Historical Provider, MD  mirtazapine (REMERON) 30 MG tablet Take 1 tablet (30 mg total) by mouth at bedtime. 07/13/14  Yes Curt Bears, MD  pantoprazole (PROTONIX) 40 MG tablet Take 40 mg by mouth 2 (two) times daily before a meal.  08/28/14 08/28/15 Yes Historical Provider, MD  sucralfate (CARAFATE) 1 G tablet Take 1 tablet (1 g  total) by mouth 4 (four) times daily -  with meals and at bedtime. 5 min before meals for radiation induced esophagitis 08/11/14  Yes Tyler Pita, MD  Calcium Carbonate-Simethicone 1000-60 MG CHEW Chew 1 tablet by mouth every 6 (six) hours as needed (discomfort in abdomen). 08/17/14   Tyler Pita, MD  dexamethasone  (DECADRON) 4 MG tablet Take 1 tablet (4 mg total) by mouth as directed. Dexamethasone Taper:  Take 4 mg morning, noon, and night for 5 days, then, 4 mg morning and night for 10  days then 2 mg morning and night for 10 days, the 2 mg once daily for 10 days, then stop 06/22/14   Tyler Pita, MD  fluconazole (DIFLUCAN) 10 MG/ML suspension Take 10 mLs (100 mg total) by mouth daily. 08/17/14   Tyler Pita, MD  fluconazole (DIFLUCAN) 100 MG tablet Take 100 mg by mouth daily. 07/13/14   Historical Provider, MD  lidocaine (XYLOCAINE) 2 % solution Use as directed 10 mLs in the mouth or throat every 6 (six) hours as needed for mouth pain. 09/08/14   Curt Bears, MD  nicotine (NICODERM CQ) 7 mg/24hr patch Place 1 patch (7 mg total) onto the skin daily. 07/24/14   Curt Bears, MD  nystatin (MYCOSTATIN) 100000 UNIT/ML suspension Take 5 mLs (500,000 Units total) by mouth 4 (four) times daily. 06/25/14   Antonietta Breach, PA-C  omeprazole (PRILOSEC) 20 MG capsule Take 20 mg by mouth daily as needed (for heartburn).    Historical Provider, MD  oxyCODONE-acetaminophen (PERCOCET/ROXICET) 5-325 MG per tablet take 1-2 tablets by mouth every 4 hours if needed for SEVERE PAIN 08/25/14   Historical Provider, MD  prochlorperazine (COMPAZINE) 10 MG tablet Take 1 tablet (10 mg total) by mouth every 6 (six) hours as needed for nausea or vomiting. 07/13/14   Curt Bears, MD  traMADol (ULTRAM) 50 MG tablet Take 50 mg by mouth 2 (two) times daily. 08/06/14   Historical Provider, MD  valACYclovir (VALTREX) 1000 MG tablet Take 1 tablet (1,000 mg total) by mouth 3 (three) times daily. 09/05/14   Susanne Borders, NP     No Known Allergies  Social History:  reports that she has been smoking.  She does not have any smokeless tobacco history on file. She reports that she does not drink alcohol or use illicit drugs.    Family History  Problem Relation Age of Onset  . Seizures Mother   . Colon cancer Father   . Hypertension Father         Physical Exam:  GEN:  Pleasant Well Nourished and Well Developed 44 y.o. African American female examined and in Discomfort no acute distress; cooperative with exam Filed Vitals:   09/08/14 1900 09/08/14 1922 09/08/14 1930 09/08/14 2000  BP: 131/79 117/63 119/63 123/48  Pulse: 118 127 113 123  Temp:      TempSrc:      Resp:  '17 24 22  '$ Height:      Weight:      SpO2: 99% 99% 99% 98%   Blood pressure 123/48, pulse 123, temperature 100.2 F (37.9 C), temperature source Oral, resp. rate 22, height '5\' 7"'$  (1.702 m), weight 91.627 kg (202 lb), last menstrual period 08/06/2014, SpO2 98 %. PSYCH: She is alert and oriented x4; does not appear anxious does not appear depressed; affect is normal HEENT: Normocephalic and Atraumatic, Mucous membranes pink; PERRLA; EOM intact; Fundi:  Benign;  No scleral icterus, Nares: Patent, Oropharynx: Clear, Fair Dentition,    Neck:  FROM, No Cervical Lymphadenopathy nor Thyromegaly or Carotid Bruit; No JVD; Breasts:: Not examined CHEST WALL: No tenderness CHEST: Normal respiration, clear to auscultation bilaterally HEART: Regular rate and rhythm; no murmurs rubs or gallops BACK: No kyphosis or scoliosis; No CVA tenderness ABDOMEN: Positive Bowel Sounds, Soft Non-Tender, No Rebound or Guarding; No Masses, No Organomegaly Rectal Exam: Not done EXTREMITIES: No Cyanosis, Clubbing, or Edema; No Ulcerations. Genitalia: not examined PULSES: 2+ and symmetric SKIN: Normal hydration no rash or ulceration CNS:  Alert and Oriented x 4, No Focal Deficits Vascular: pulses palpable throughout    Labs on Admission:  Basic Metabolic Panel:  Recent Labs Lab 09/08/14 1730  NA 132*  K 3.6  CL 95*  CO2 24  GLUCOSE 108*  BUN 11  CREATININE 0.88  CALCIUM 9.4   Liver Function Tests:  Recent Labs Lab 09/08/14 1730  AST 29  ALT 33  ALKPHOS 83  BILITOT 0.4  PROT 7.9  ALBUMIN 3.8   No results for input(s): LIPASE, AMYLASE in the last 168 hours. No  results for input(s): AMMONIA in the last 168 hours. CBC:  Recent Labs Lab 09/08/14 1730  WBC 7.9  NEUTROABS 6.6  HGB 12.7  HCT 35.9*  MCV 87.3  PLT 203   Cardiac Enzymes: No results for input(s): CKTOTAL, CKMB, CKMBINDEX, TROPONINI in the last 168 hours.  BNP (last 3 results) No results for input(s): BNP in the last 8760 hours.  ProBNP (last 3 results) No results for input(s): PROBNP in the last 8760 hours.  CBG: No results for input(s): GLUCAP in the last 168 hours.  Radiological Exams on Admission: Dg Chest 2 View  09/08/2014   CLINICAL DATA:  Productive cough and shortness of breath for the past 3 days; onset of fever today. Long history of tobacco use  EXAM: CHEST  2 VIEW  COMPARISON:  PA and lateral chest x-ray of September 05, 2014  FINDINGS: The lungs are adequately inflated. The interstitial markings have become more conspicuous especially on the left. There is no alveolar infiltrate. There is no pleural effusion. The heart and pulmonary vascularity are normal. The mediastinum is normal in width. The bony thorax is unremarkable.  IMPRESSION: Increased pulmonary interstitial markings predominantly on the left are worrisome for early pneumonia. Stable density in the right upper lobe is noted consistent with known pulmonary nodules.   Electronically Signed   By: David  Martinique M.D.   On: 09/08/2014 17:18     EKG: Independently reviewed.         Assessment/Plan:   44 y.o. female with  Principal Problem:   1.   CAP (community acquired pneumonia)   IV Levaquin   IVFs   Active Problems:   2.   Sinus tachycardia- due to #1, and due to Chest Wall Pain vs Early Sepsis   Cardiac monitoring   Pain control   IVFs     3.   Hypertension   Continue Prinizide Rx   Monitor BPs     4.   Cigarette smoker   Nicotine Patch   Continue to decrease smoking     5.   Primary cancer of right upper lobe of lung   Notify Oncology in AM     6.   DVT  Prophylaxis   Lovenox          Code Status:     FULL CODE       Family Communication:   Family at Bedside    Disposition Plan:  Inpatient  Status        Time spent:  [facility] Pager [phone]   If [place] Please Contact the Day Rounding Team MD for [facility]  If 7PM-7AM, Please Contact Night-Floor Coverage  [email or web address] Password [email or web address] 09/08/2014, 9:02 PM     ADDENDUM:   Patient was seen and examined on 09/08/2014

## 2014-09-08 NOTE — Telephone Encounter (Signed)
Pt called before writer was able to return call- triage advised culture results and addressed other concerns with pt directly.

## 2014-09-08 NOTE — Telephone Encounter (Signed)
Refill: Xylocaine. Patient notified.

## 2014-09-08 NOTE — ED Notes (Signed)
Pt requesting to eat; snack pack provided and ice water provided

## 2014-09-08 NOTE — ED Provider Notes (Signed)
CSN: [id number]     Arrival date & time 09/08/14  1651 History   First MD Initiated Contact with Patient 09/08/14 1710     Chief Complaint  Patient presents with  . Pneumonia     (Consider location/radiation/quality/duration/timing/severity/associated sxs/prior Treatment) HPI  Pt with hx of lung cancer presenting with c/o coughing and low grade fever.  She is currently on a break from chemo for the past 2 weeks and break from chest radiation for the past week.  She has pain with eating and drinking and states she has not been able to drink for the past 4 days.  No vomiting.  No change in stools.  She has had diffuse body aches and worsening cough.  There are no other associated systemic symptoms, there are no other alleviating or modifying factors.   Past Medical History  Diagnosis Date  . Thyroid nodule   . Depression   . Suicide attempt   . SVT (supraventricular tachycardia)     a. Long RP tachycardia;  b. 01/2014 s/p RFCA.  . Lung cancer    Past Surgical History  Procedure Laterality Date  . Thyroid surgery      Removed thyroid nodule, states still has thyroid; 2010  . Supraventricular tachycardia ablation N/A 01/16/2014    Procedure: SUPRAVENTRICULAR TACHYCARDIA ABLATION;  Surgeon: Evans Lance, MD;  Location: [facility] CATH LAB;  Service: Cardiovascular;  Laterality: N/A;  . Tee without cardioversion N/A 06/12/2014    Procedure: TRANSESOPHAGEAL ECHOCARDIOGRAM (TEE);  Surgeon: Sueanne Margarita, MD;  Location: [facility] ENDOSCOPY;  Service: Cardiovascular;  Laterality: N/A;   Family History  Problem Relation Age of Onset  . Seizures Mother   . Colon cancer Father   . Hypertension Father    History  Substance Use Topics  . Smoking status: Current Some Day Smoker -- 1.00 packs/day for 30 years    Last Attempt to Quit: 06/13/2014  . Smokeless tobacco: Not on file  . 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  Home Medications   Prior to Admission medications   Medication Sig Start Date End Date Taking? Authorizing Provider  dexamethasone (DECADRON) 1 MG tablet Take 2 mg by mouth daily with breakfast.  07/12/14  Yes Historical Provider, MD  HYDROcodone-acetaminophen (NORCO/VICODIN) 5-325 MG per tablet Take 1-2 tablets by mouth every 6 (six) hours as needed for moderate pain. 09/05/14  Yes Susanne Borders, NP  lisinopril-hydrochlorothiazide (PRINZIDE,ZESTORETIC) 10-12.5 MG per tablet Take 1 tablet by mouth daily.   Yes Historical Provider, MD  LORazepam (ATIVAN) 1 MG tablet Take 1 mg by mouth every 8 (eight) hours as needed for anxiety.  06/19/14  Yes Historical Provider, MD  mirtazapine (REMERON) 30 MG tablet Take 1 tablet (30 mg total) by mouth at bedtime. 07/13/14  Yes Curt Bears, MD  nystatin (MYCOSTATIN) 100000 UNIT/ML suspension Take 5 mLs (500,000 Units total) by mouth 4 (four) times daily. 06/25/14  Yes Antonietta Breach, PA-C  omeprazole (PRILOSEC) 20 MG capsule Take 20 mg by mouth daily as needed (for heartburn).   Yes Historical Provider, MD  oxyCODONE-acetaminophen (PERCOCET/ROXICET) 5-325 MG per tablet take 1-2 tablets by mouth every 4 hours if needed for SEVERE PAIN 08/25/14  Yes Historical Provider, MD  pantoprazole (PROTONIX) 40 MG tablet Take 40 mg by mouth 2 (two) times daily before a meal.  08/28/14 08/28/15 Yes Historical Provider, MD  prochlorperazine (COMPAZINE) 10 MG tablet Take 1 tablet (10 mg total) by mouth every 6 (six) hours as needed for nausea or vomiting. 07/13/14  Yes Curt Bears, MD  sucralfate (CARAFATE) 1 G tablet Take 1 tablet (1 g total) by mouth 4 (four) times daily -  with meals and at bedtime. 5 min before meals for radiation induced esophagitis 08/11/14  Yes Tyler Pita, MD  traMADol (ULTRAM) 50 MG tablet Take 50  mg by mouth 2 (two) times daily. 08/06/14  Yes Historical Provider, MD   BP 133/79 mmHg  Pulse 94  Temp(Src) 98.5 F (36.9 C) (Oral)  Resp 18  Ht '5\' 7"'$  (1.702 m)  Wt 201 lb 4.5 oz (91.3 kg)  BMI 31.52 kg/m2  SpO2 97%  LMP 08/06/2014  Vitals reviewed Physical Exam  Physical Examination: General appearance - alert, well appearing, and in no distress Mental status - alert, oriented to person, place, and time Eyes - no conjunctival injection, no scleral icterus Mouth - mucous membranes dry, OP clear Chest - clear to auscultation, no wheezes, rales or rhonchi, symmetric air entry Heart - normal rate, regular rhythm, normal S1, S2, no murmurs, rubs, clicks or gallops Abdomen - soft, nontender, nondistended, no masses or organomegaly Neurological - alert, oriented Extremities - peripheral pulses normal, no pedal edema, no clubbing or cyanosis Skin - normal coloration and turgor, no rashes  ED Course  Procedures (including critical care time)  ED ECG REPORT   Date: 09/08/2014  Rate: 130  Rhythm: sinus tachycardia  QRS Axis: normal  Intervals: normal  ST/T Wave abnormalities: nonspecific T wave changes  Conduction Disutrbances:none  Narrative Interpretation:   Old EKG Reviewed: none available  I have personally reviewed the EKG tracing and agree with the computerized printout as noted.  EKG not available in epic for interpretation into muse  Labs Review Labs Reviewed  COMPREHENSIVE METABOLIC PANEL - Abnormal; Notable for the following:    Sodium 132 (*)    Chloride 95 (*)    Glucose, Bld 108 (*)    All other components within normal limits  CBC WITH DIFFERENTIAL/PLATELET - Abnormal; Notable for the following:    HCT 35.9 (*)    RDW 15.6 (*)    Neutrophils Relative % 84 (*)    Lymphocytes Relative 8 (*)    Lymphs Abs 0.6 (*)    All other components within normal limits  URINALYSIS, ROUTINE W REFLEX MICROSCOPIC (NOT AT [facility]) - Abnormal; Notable for the following:     Color, Urine AMBER (*)    Bilirubin Urine SMALL (*)    Ketones, ur >80 (*)    All other components within normal limits  BASIC METABOLIC PANEL - Abnormal; Notable for the following:    Sodium 134 (*)    BUN <5 (*)    Calcium 8.2 (*)    All other components within normal limits  CBC - Abnormal; Notable for the following:    RBC 3.34 (*)    Hemoglobin 10.2 (*)    HCT 29.6 (*)    RDW 15.8 (*)    All other components within normal limits  CULTURE, BLOOD (ROUTINE X 2)  CULTURE, BLOOD (ROUTINE X 2)  URINE CULTURE  MRSA PCR SCREENING  GRAM STAIN  PROCALCITONIN  STREP PNEUMONIAE URINARY ANTIGEN  CBC  BASIC METABOLIC PANEL  I-STAT CG4 LACTIC ACID, ED  I-STAT CG4 LACTIC ACID, ED    Imaging Review Dg Chest 2 View  09/08/2014   CLINICAL DATA:  Productive cough and shortness of breath for the past 3 days; onset of fever today. Long history of tobacco use  EXAM: CHEST  2 VIEW  COMPARISON:  PA and lateral chest x-ray of September 05, 2014  FINDINGS: The lungs are adequately inflated. The interstitial markings have become more conspicuous especially on the left. There is no alveolar infiltrate. There is no pleural effusion. The heart and pulmonary vascularity are normal. The mediastinum is normal in width. The bony thorax is unremarkable.  IMPRESSION: Increased pulmonary interstitial markings predominantly on the left are worrisome for early pneumonia. Stable density in the right upper lobe is noted consistent with known pulmonary nodules.   Electronically Signed   By: David  Martinique M.D.   On: 09/08/2014 17:18     EKG Interpretation   Date/Time:  Friday September 08 2014 17:35:01 EDT Ventricular Rate:  130 PR Interval:  184 QRS Duration: 88 QT Interval:  306 QTC Calculation: 450 R Axis:   -6 Text Interpretation:  Sinus tachycardia Borderline T wave abnormalities ED  PHYSICIAN INTERPRETATION AVAILABLE IN [facility] Confirmed by TEST,  Record ([id number]) on 09/09/2014 9:57:14 AM      MDM   Final  diagnoses:  Cough  hcap Fever Lung cancer Hyponatremia Anemia dehydration  Pt presenting with cough, fever- dehydration.  CXr c/w HCAP.  Pt started on levaquin, IV fluids given.  Pt having pain with swallowing presumably due to her radiation- feels improved after GI cocktail given.  Pt d/w Dr. Arnoldo Morale, [facility] for admission.    Xray images reviewed and interpreted by me as well.  Nursing notes including past medical history and social history reviewed and considered in documentation Prior records reviewed and considered during this visit  8:43 PM d/w Dr. Arnoldo Morale, [facility] for admission, pt to go to telemetry bed.     Alfonzo Beers, MD 09/09/14 2330

## 2014-09-08 NOTE — ED Notes (Signed)
Pt reports she has lung cancer and has been coughing, sent here for evaluation for pneumonia. Reports pain to entire chest for radiation on last Friday.

## 2014-09-08 NOTE — Telephone Encounter (Signed)
PT.'S SON WOULD LIKE THE RESULTS OF CULTURE TAKEN ON 09/05/14.

## 2014-09-08 NOTE — Telephone Encounter (Signed)
AT 9:53 PT.'S SON, [name], LEFT A VOICE MAIL CONCERNING A NEED FOR SOME "NUMBING MEDICATION" FOR HIS MOTHER'S MOUTH. SHE IS HAVING PROBLEMS EATING AND SWALLOWING. SPOKE [place], NURSE, SARA HANSEN,LPN. SHE WILL CONTACT PT.'S SON.

## 2014-09-08 NOTE — ED Notes (Signed)
Pt placed in mask

## 2014-09-08 NOTE — Telephone Encounter (Signed)
Culture, Herpes Herpes Culture   Comments: Final - ===== FINAL REPORT =====No Herpes Simplex Virus detected.

## 2014-09-08 NOTE — Progress Notes (Signed)
Received report from ED nurse. Awaiting pt arrival.

## 2014-09-09 DIAGNOSIS — E871 Hypo-osmolality and hyponatremia: Secondary | ICD-10-CM

## 2014-09-09 DIAGNOSIS — K208 Other esophagitis: Secondary | ICD-10-CM

## 2014-09-09 DIAGNOSIS — J189 Pneumonia, unspecified organism: Principal | ICD-10-CM

## 2014-09-09 DIAGNOSIS — R0789 Other chest pain: Secondary | ICD-10-CM

## 2014-09-09 LAB — BASIC METABOLIC PANEL
Anion gap: 11 (ref 5–15)
BUN: <5 mg/dL — ABNORMAL LOW (ref 6–20)
CALCIUM: 8.2 mg/dL — AB (ref 8.9–10.3)
CO2: 22 mmol/L (ref 22–32)
CREATININE: 0.65 mg/dL (ref 0.44–1.00)
Chloride: 101 mmol/L (ref 101–111)
Glucose, Bld: 88 mg/dL (ref 65–99)
Potassium: 3.6 mmol/L (ref 3.5–5.1)
SODIUM: 134 mmol/L — AB (ref 135–145)

## 2014-09-09 LAB — MRSA PCR SCREENING: MRSA BY PCR: NEGATIVE

## 2014-09-09 LAB — CBC
HEMATOCRIT: 29.6 % — AB (ref 36.0–46.0)
Hemoglobin: 10.2 g/dL — ABNORMAL LOW (ref 12.0–15.0)
MCH: 30.5 pg (ref 26.0–34.0)
MCHC: 34.5 g/dL (ref 30.0–36.0)
MCV: 88.6 fL (ref 78.0–100.0)
PLATELETS: 167 10*3/uL (ref 150–400)
RBC: 3.34 MIL/uL — ABNORMAL LOW (ref 3.87–5.11)
RDW: 15.8 % — ABNORMAL HIGH (ref 11.5–15.5)
WBC: 4.8 10*3/uL (ref 4.0–10.5)

## 2014-09-09 LAB — PROCALCITONIN

## 2014-09-09 MED ORDER — NICOTINE 7 MG/24HR TD PT24
7.0000 mg | MEDICATED_PATCH | Freq: Every day | TRANSDERMAL | Status: DC
  Administered 2014-09-10 – 2014-09-11 (×2): 7 mg via TRANSDERMAL
  Filled 2014-09-09 (×3): qty 1

## 2014-09-09 MED ORDER — SUCRALFATE 1 GM/10ML PO SUSP
1.0000 g | Freq: Three times a day (TID) | ORAL | Status: DC
  Administered 2014-09-09 – 2014-09-11 (×9): 1 g via ORAL
  Filled 2014-09-09 (×15): qty 10

## 2014-09-09 MED ORDER — GUAIFENESIN-DM 100-10 MG/5ML PO SYRP
5.0000 mL | ORAL_SOLUTION | Freq: Four times a day (QID) | ORAL | Status: DC | PRN
  Administered 2014-09-10 – 2014-09-11 (×3): 5 mL via ORAL
  Filled 2014-09-09 (×4): qty 10

## 2014-09-09 MED ORDER — SODIUM CHLORIDE 0.9 % IV SOLN
INTRAVENOUS | Status: DC
  Administered 2014-09-10: 100 mL/h via INTRAVENOUS

## 2014-09-09 MED ORDER — FLUCONAZOLE 200 MG PO TABS
200.0000 mg | ORAL_TABLET | Freq: Every day | ORAL | Status: DC
  Administered 2014-09-09 – 2014-09-11 (×3): 200 mg via ORAL
  Filled 2014-09-09 (×3): qty 1

## 2014-09-09 NOTE — Progress Notes (Signed)
Pt report called to 5W RN, belongs packed to be sent to new room with patient, CMT and elink notified of transfer. All meds current to time.
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
## 2014-09-10 DIAGNOSIS — E860 Dehydration: Secondary | ICD-10-CM

## 2014-09-10 DIAGNOSIS — R1310 Dysphagia, unspecified: Secondary | ICD-10-CM

## 2014-09-10 LAB — CBC
HCT: 28.8 % — ABNORMAL LOW (ref 36.0–46.0)
Hemoglobin: 10.1 g/dL — ABNORMAL LOW (ref 12.0–15.0)
MCH: 30.5 pg (ref 26.0–34.0)
MCHC: 35.1 g/dL (ref 30.0–36.0)
MCV: 87 fL (ref 78.0–100.0)
PLATELETS: 176 10*3/uL (ref 150–400)
RBC: 3.31 MIL/uL — ABNORMAL LOW (ref 3.87–5.11)
RDW: 15.5 % (ref 11.5–15.5)
WBC: 3.3 10*3/uL — AB (ref 4.0–10.5)

## 2014-09-10 LAB — BASIC METABOLIC PANEL
Anion gap: 10 (ref 5–15)
BUN: <5 mg/dL — ABNORMAL LOW (ref 6–20)
CO2: 21 mmol/L — ABNORMAL LOW (ref 22–32)
CREATININE: 0.58 mg/dL (ref 0.44–1.00)
Calcium: 8.9 mg/dL (ref 8.9–10.3)
Chloride: 107 mmol/L (ref 101–111)
GFR calc non Af Amer: >60 mL/min (ref >60)
GLUCOSE: 101 mg/dL — AB (ref 65–99)
Potassium: 3.8 mmol/L (ref 3.5–5.1)
Sodium: 138 mmol/L (ref 135–145)

## 2014-09-10 LAB — URINE CULTURE

## 2014-09-10 MED ORDER — LIDOCAINE VISCOUS 2 % MT SOLN
10.0000 mL | OROMUCOSAL | Status: DC | PRN
  Administered 2014-09-10 – 2014-09-11 (×3): 10 mL via OROMUCOSAL
  Filled 2014-09-10 (×4): qty 10

## 2014-09-10 MED ORDER — PHENOL 1.4 % MT LIQD
1.0000 | OROMUCOSAL | Status: DC | PRN
  Administered 2014-09-10: 1 via OROMUCOSAL
  Filled 2014-09-10: qty 177
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
## 2014-09-10 NOTE — Progress Notes (Signed)
Patient calmer after explanation of new medication orders received.  Appeared satisfied with outcome.  Is using Chloroseptic oral spray.

## 2014-09-10 NOTE — Progress Notes (Signed)
Patient upset / walking up and down hall stating she wanted another nurse because she was not receiving her pain medications as she wanted them.  MD was called to increase time of lidocaine to q 4 hours from q 6 hours and to give her come Cepracol spray, but she stated this was not timely enough for her.  Was yelling at Probation officer, stating that she wanted another nurse.  Charge nurse in to speak with her.

## 2014-09-10 NOTE — Progress Notes (Signed)
Patient calmer after 1:1 and explaination of medication regimen.  Patient requires frequent cueing of when medications are due and times should be written on white board in room when medications are available.  Patient given Cloreseptic spray, which she can independently manage, giving her some semblance of control which she may have perceived as lost over the past year.

## 2014-09-11 DIAGNOSIS — K209 Esophagitis, unspecified without bleeding: Secondary | ICD-10-CM | POA: Insufficient documentation

## 2014-09-11 DIAGNOSIS — J189 Pneumonia, unspecified organism: Secondary | ICD-10-CM | POA: Insufficient documentation

## 2014-09-11 DIAGNOSIS — E860 Dehydration: Secondary | ICD-10-CM | POA: Insufficient documentation

## 2014-09-11 DIAGNOSIS — R1310 Dysphagia, unspecified: Secondary | ICD-10-CM | POA: Insufficient documentation

## 2014-09-11 LAB — PROCALCITONIN

## 2014-09-11 MED ORDER — TRAMADOL HCL 50 MG PO TABS: 50.0000 mg | ORAL_TABLET | Freq: Two times a day (BID) | ORAL | Status: DC

## 2014-09-11 MED ORDER — LIDOCAINE VISCOUS 2 % MT SOLN: 10.0000 mL | OROMUCOSAL | Status: DC | PRN

## 2014-09-11 MED ORDER — LEVOFLOXACIN 750 MG PO TABS: 750.0000 mg | ORAL_TABLET | Freq: Every day | ORAL | Status: DC

## 2014-09-11 MED ORDER — FLUCONAZOLE 200 MG PO TABS: 200.0000 mg | ORAL_TABLET | Freq: Every day | ORAL | Status: DC

## 2014-09-11 MED ORDER — HYDROCODONE-ACETAMINOPHEN 5-325 MG PO TABS: 1.0000 | ORAL_TABLET | Freq: Four times a day (QID) | ORAL | Status: DC | PRN

## 2014-09-11 MED ORDER — GUAIFENESIN-DM 100-10 MG/5ML PO SYRP: 5.0000 mL | ORAL_SOLUTION | Freq: Four times a day (QID) | ORAL | Status: DC | PRN

## 2014-09-11 MED ORDER — PHENOL 1.4 % MT LIQD: 1.0000 | OROMUCOSAL | Status: DC | PRN

## 2014-09-11 MED ORDER — LIDOCAINE-PRILOCAINE 2.5-2.5 % EX CREA: TOPICAL_CREAM | Freq: Two times a day (BID) | CUTANEOUS | Status: DC | PRN

## 2014-09-11 MED ORDER — SUCRALFATE 1 GM/10ML PO SUSP: 1.0000 g | Freq: Three times a day (TID) | ORAL | Status: DC

## 2014-09-11 MED ORDER — NICOTINE 7 MG/24HR TD PT24: 7.0000 mg | MEDICATED_PATCH | Freq: Every day | TRANSDERMAL | Status: DC

## 2014-09-11 NOTE — Discharge Summary (Signed)
Physician Discharge Summary  Destiny [name] QQV:[id number] DOB: [birth year] DOA: 09/08/2014  PCP: Barrie Lyme, FNP  Primary Oncologist: Dr. Curt Bears Primary Radiation Oncologist: Dr. Tammi Klippel  Admit date: 09/08/2014 Discharge date: 09/11/2014  Time spent: Greater than 30 minutes  Recommendations for Outpatient Follow-up:  1. Gwenlyn Perking, FNP/PCP. Patient has prior follow-up appointment on 09/12/14 which she was advised to keep. Patient needs repeat labs (CBC & BMP) in 5-7 days from discharge. Please follow final blood culture results that were sent from the hospital. 2. Dr. Curt Bears, Oncology 3. Dr. Tyler Pita, Radiation Oncology 4. Recommend repeating chest x-ray or CT chest as part of her oncology staging to ensure resolution of pneumonia findings.  Discharge Diagnoses:  Principal Problem:   CAP (community acquired pneumonia) Active Problems:   Hypertension   Cigarette smoker   Primary cancer of right upper lobe of lung   Sinus tachycardia   Cough   Discharge Condition: Improved & Stable  Diet recommendation: Heart healthy diet.  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 She continues to have lower substernal pain with any by mouth intake and hence has not been able to eat or drink much. She also has chest wall pain at site of radiation burns mid sternum and right scapular region. In the ED,  hemodynamically stable, temperature 100.46F, no leukocytosis and chest x-ray suggested left sided pneumonia. Admitted for further evaluation and management.  Hospital Course:   Healthcare associated pneumonia - Patient is immunocompromised related to underlying cancer diagnosis and chemoradiation. - Patient does not look septic or toxic. She is hemodynamically stable and not hypoxic. - Started on IV levofloxacin empirically.  - Supportive treatment. - Follow-up chest x-ray or even better-patient supposed to have Staging Imaging Studies for her Lung Cancer- to Ensure Resolution of Pneumonia - Trend pro-calcitonin: <0.10. - HIV antibody screen: Negative on 06/11/14. - Improving. Defervesced. - Blood cultures 2: Negative to date. - Clinically improved. No dyspnea. Chest wall pain related to radiation burns improved. Cough with minimal intermittent tan sputum. Discharge to complete total 7 days of oral levofloxacin. - Patient requested refill of opioid pain medications for her chest wall pain and painful swallowing. She was given a short supply until follow-up with her PCP/oncologists. She was counseled not to use these medications and drive, not to drink alcohol along with these medications or consume other sedating medications. She verbalized understanding.  Odynophagia/dysphagia/radiation induced esophagitis/Posible Candida esophagitis - Poor oral intake related to same. - Continue PPI and Viscous Lidocaine when necessary. Resume sucralfate liquid. - Discussed with GI M.D. on 7/2 who recommended starting fluconazole for esophageal candidiasis and completing 14 days course. - Patient states that her swallowing has improved. We will discharge on Viscous Lidocaine, sucralfate, PPI and fluconazole. - Outpatient follow-up with oncology. - Increasing oral intake.  Dehydration with hyponatremia - Related to poor oral intake - Treated with IV fluids and resolved.  Sinus tachycardia - Related to  dehydration, pain and  infection. Resolved.  Muscular chest wall pain - Related to radiation burns - Pain management. - Improved.  Essential hypertension - Controlled off medications. Discontinued Zestoretic-reevaluate during outpatient follow-up.  Tobacco abuse - Nicotine patch. Cessation counseled  Anemia - Stable  Stage IV lung cancer - Outpatient follow-up with oncology    Consultants:  None  Procedures:  None    Discharge Exam:  Complaints: Feels much better. Painful swallowing has definitely improved. Appetite also improved. No dyspnea. Chest wall pain better. Cough with intermittent mild tan sputum-seen in cup.  Filed Vitals:   09/10/14 0553 09/10/14 1335 09/10/14 2115 09/11/14 0543  BP: 119/75 120/75 109/62 113/66  Pulse: 79 93 111 88  Temp: 98 F (36.7 C) 97.8 F (36.6 C) 98.3 F (36.8 C) 98.5 F (36.9 C)  TempSrc: Oral Oral Oral Oral  Resp: '18 18 18 16  '$ Height:      Weight:      SpO2: 96% 97% 96% 99%    General exam: Moderately built and nourished, chronically ill-appearing female patient sitting up in bed eating breakfast this morning. Appears cheerful. Grandson at bedside.. ENT: No oral pharyngeal thrush. No acute findings in throat. Mucosa moist. Respiratory system: clear to auscultation. No increased work of breathing. Superficial peeling of skin in the midsternal region from radiation injury-examined 7/2.[name] Cardiovascular system: S1 & S2 heard, RRR. No JVD, murmurs, gallops, clicks or pedal edema.  Gastrointestinal system: Abdomen is nondistended, soft and nontender. Normal bowel sounds heard. Central nervous system: Alert and oriented. No focal neurological deficits. Extremities: Symmetric 5 x 5 power. Skin: Superficial clean ulcer over right scapula from radiation injury. Examined 7/2.  Discharge Instructions      Discharge Instructions    Call MD for:  difficulty breathing, headache or visual disturbances    Complete by:  As directed       Call MD for:  extreme fatigue    Complete by:  As directed      Call MD for:  hives    Complete by:  As directed      Call MD for:  persistant dizziness or light-headedness    Complete by:  As directed      Call MD for:  persistant nausea and vomiting    Complete by:  As directed      Call MD for:  redness, tenderness, or signs of infection (pain, swelling, redness, odor or green/yellow discharge around incision site)    Complete by:  As directed      Call MD for:  severe uncontrolled pain    Complete by:  As directed      Call MD for:  temperature >100.4    Complete by:  As directed      Diet - low sodium heart healthy    Complete by:  As directed      Increase activity slowly    Complete by:  As directed             Medication List    STOP taking these medications        lisinopril-hydrochlorothiazide 10-12.5 MG per tablet  Commonly known as:  PRINZIDE,ZESTORETIC     nystatin 100000 UNIT/ML suspension  Commonly known as:  MYCOSTATIN     omeprazole 20 MG capsule  Commonly known as:  PRILOSEC     oxyCODONE-acetaminophen 5-325 MG per tablet  Commonly known as:  PERCOCET/ROXICET     sucralfate 1 G tablet  Commonly known as:  CARAFATE  Replaced by:  sucralfate 1 GM/10ML suspension      TAKE these medications        dexamethasone 1 MG tablet  Commonly known as:  DECADRON  Take 2 mg by mouth daily with breakfast.     fluconazole 200 MG tablet  Commonly known as:  DIFLUCAN  Take 1 tablet (200 mg total) by mouth daily.     guaiFENesin-dextromethorphan 100-10 MG/5ML syrup  Commonly known as:  ROBITUSSIN DM  Take 5 mLs by mouth every 6 (six) hours as needed for cough.     HYDROcodone-acetaminophen 5-325 MG per tablet  Commonly known as:  NORCO/VICODIN  Take 1-2 tablets by mouth every 6 (six) hours as needed for moderate pain or severe pain.     levofloxacin 750 MG tablet  Commonly known as:  LEVAQUIN  Take 1 tablet (750 mg total) by mouth daily.      lidocaine 2 % solution  Commonly known as:  XYLOCAINE  Use as directed 10 mLs in the mouth or throat every 4 (four) hours as needed for mouth pain.     lidocaine-prilocaine cream  Commonly known as:  EMLA  Apply topically 2 (two) times daily as needed (painful skin on chest).     LORazepam 1 MG tablet  Commonly known as:  ATIVAN  Take 1 mg by mouth every 8 (eight) hours as needed for anxiety.     mirtazapine 30 MG tablet  Commonly known as:  REMERON  Take 1 tablet (30 mg total) by mouth at bedtime.     nicotine 7 mg/24hr patch  Commonly known as:  NICODERM CQ - dosed in mg/24 hr  Place 1 patch (7 mg total) onto the skin daily.     pantoprazole 40 MG tablet  Commonly known as:  PROTONIX  Take 40 mg by mouth 2 (two) times daily before a meal.     phenol 1.4 % Liqd  Commonly known as:  CHLORASEPTIC  Use as directed 1 spray in the mouth or throat as needed for throat irritation / pain.     prochlorperazine 10 MG tablet  Commonly known as:  COMPAZINE  Take 1 tablet (10 mg total) by mouth every 6 (six) hours as needed for nausea or vomiting.     sucralfate 1 GM/10ML suspension  Commonly known as:  CARAFATE  Take 10 mLs (1 g total) by mouth 4 (four) times daily -  with meals and at bedtime.     traMADol 50 MG tablet  Commonly known as:  ULTRAM  Take 1 tablet (50 mg total) by mouth 2 (two) times daily.       Follow-up Information    Follow up with Barrie Lyme, FNP On 09/12/2014.   Specialty:  Nurse Practitioner   Why:  Please keep previous appointment. Needs to have repeat labs (CBC & BMP) in 5-7 days from discharge.   Contact information:   [address] [address] [phone]       Schedule an appointment as soon as possible for a visit with Eilleen Kempf., MD.   Specialty:  Oncology   Contact information:   [place] [address] [phone]       Schedule an appointment as soon as possible for a visit with  Tyler Pita, MD.   Specialty:  Radiation Oncology   Contact information:   [place] [address] [phone]        The results of significant diagnostics  from this hospitalization (including imaging, microbiology, ancillary and laboratory) are listed below for reference.    Significant Diagnostic Studies: Dg Chest 2 View  09/08/2014   CLINICAL DATA:  Productive cough and shortness of breath for the past 3 days; onset of fever today. Long history of tobacco use  EXAM: CHEST  2 VIEW  COMPARISON:  PA and lateral chest x-ray of September 05, 2014  FINDINGS: The lungs are adequately inflated. The interstitial markings have become more conspicuous especially on the left. There is no alveolar infiltrate. There is no pleural effusion. The heart and pulmonary vascularity are normal. The mediastinum is normal in width. The bony thorax is unremarkable.  IMPRESSION: Increased pulmonary interstitial markings predominantly on the left are worrisome for early pneumonia. Stable density in the right upper lobe is noted consistent with known pulmonary nodules.   Electronically Signed   By: David  Martinique M.D.   On: 09/08/2014 17:18   Dg Chest 2 View  09/05/2014   CLINICAL DATA:  History of lung carcinoma, currently under treatment with radiation, productive cough and increasing shortness of breath  EXAM: CHEST  2 VIEW  COMPARISON:  PET-CT of 06/23/2014, chest x-ray of 06/14/2014, and chest x-ray of 12/08/2013  FINDINGS: The nodular areas in the right mid lung noted on CT of the chest appear less prominent presumably in response to the therapy. No infiltrate or effusion is seen and no new lung nodule is noted. Mediastinal and hilar contours are unchanged. The heart is within normal limits in size. No bony abnormality is seen.  IMPRESSION: The nodules in the right mid lung are less prominent most likely in response to interval treatment. No new nodular opacity is seen.   Electronically Signed    By: Ivar Drape M.D.   On: 09/05/2014 16:13    Microbiology: Recent Results (from the past 240 hour(s))  Herpes simplex virus culture     Status: None   Collection Time: 09/05/14  5:07 PM  Result Value Ref Range Status   Culture, Herpes Herpes Culture  Final    Comment: Final - ===== FINAL REPORT =====No Herpes Simplex Virus detected.  MRSA PCR Screening     Status: None   Collection Time: 09/08/14 10:40 AM  Result Value Ref Range Status   MRSA by PCR NEGATIVE NEGATIVE Final    Comment:        The GeneXpert MRSA Assay (FDA approved for NASAL specimens only), is one component of a comprehensive MRSA colonization surveillance program. It is not intended to diagnose MRSA infection nor to guide or monitor treatment for MRSA infections.   Culture, blood (routine x 2)     Status: None (Preliminary result)   Collection Time: 09/08/14  5:30 PM  Result Value Ref Range Status   Specimen Description BLOOD RIGHT ARM  Final   Special Requests BOTTLES DRAWN AEROBIC AND ANAEROBIC 5CC  Final   Culture NO GROWTH 2 DAYS  Final   Report Status PENDING  Incomplete  Culture, blood (routine x 2)     Status: None (Preliminary result)   Collection Time: 09/08/14  5:38 PM  Result Value Ref Range Status   Specimen Description BLOOD LEFT ARM  Final   Special Requests BOTTLES DRAWN AEROBIC AND ANAEROBIC 5CC  Final   Culture NO GROWTH 2 DAYS  Final   Report Status PENDING  Incomplete  Urine culture     Status: None   Collection Time: 09/08/14  7:25 PM  Result Value Ref Range Status  Specimen Description URINE, RANDOM  Final   Special Requests NONE  Final   Culture   Final    MULTIPLE SPECIES PRESENT, SUGGEST RECOLLECTION IF CLINICALLY INDICATED   Report Status 09/10/2014 FINAL  Final     Labs: Basic Metabolic Panel:  Recent Labs Lab 09/08/14 1730 09/09/14 0301 09/10/14 0520  NA 132* 134* 138  K 3.6 3.6 3.8  CL 95* 101 107  CO2 24 22 21*  GLUCOSE 108* 88 101*  BUN 11 <5* <5*   CREATININE 0.88 0.65 0.58  CALCIUM 9.4 8.2* 8.9   Liver Function Tests:  Recent Labs Lab 09/08/14 1730  AST 29  ALT 33  ALKPHOS 83  BILITOT 0.4  PROT 7.9  ALBUMIN 3.8   No results for input(s): LIPASE, AMYLASE in the last 168 hours. No results for input(s): AMMONIA in the last 168 hours. CBC:  Recent Labs Lab 09/08/14 1730 09/09/14 0301 09/10/14 0520  WBC 7.9 4.8 3.3*  NEUTROABS 6.6  --   --   HGB 12.7 10.2* 10.1*  HCT 35.9* 29.6* 28.8*  MCV 87.3 88.6 87.0  PLT 203 167 176   Cardiac Enzymes: No results for input(s): CKTOTAL, CKMB, CKMBINDEX, TROPONINI in the last 168 hours. BNP: BNP (last 3 results) No results for input(s): BNP in the last 8760 hours.  ProBNP (last 3 results) No results for input(s): PROBNP in the last 8760 hours.  CBG: No results for input(s): GLUCAP in the last 168 hours.      Signed:  Vernell Leep, MD, FACP, FHM. [facility] Pager [phone]  If 7PM-7AM, please contact night-coverage [email or web address] Password [email or web address] 09/11/2014, 11:44 AM

## 2014-09-11 NOTE — Discharge Instructions (Signed)
Pneumonia Pneumonia is an infection of the lungs.  CAUSES Pneumonia may be caused by bacteria or a virus. Usually, these infections are caused by breathing infectious particles into the lungs (respiratory tract). SIGNS AND SYMPTOMS   Cough.  Fever.  Chest pain.  Increased rate of breathing.  Wheezing.  Mucus production. DIAGNOSIS  If you have the common symptoms of pneumonia, your health care provider will typically confirm the diagnosis with a chest X-ray. The X-ray will show an abnormality in the lung (pulmonary infiltrate) if you have pneumonia. Other tests of your blood, urine, or sputum may be done to find the specific cause of your pneumonia. Your health care provider may also do tests (blood gases or pulse oximetry) to see how well your lungs are working. TREATMENT  Some forms of pneumonia may be spread to other people when you cough or sneeze. You may be asked to wear a mask before and during your exam. Pneumonia that is caused by bacteria is treated with antibiotic medicine. Pneumonia that is caused by the influenza virus may be treated with an antiviral medicine. Most other viral infections must run their course. These infections will not respond to antibiotics.  HOME CARE INSTRUCTIONS   Cough suppressants may be used if you are losing too much rest. However, coughing protects you by clearing your lungs. You should avoid using cough suppressants if you can.  Your health care provider may have prescribed medicine if he or she thinks your pneumonia is caused by bacteria or influenza. Finish your medicine even if you start to feel better.  Your health care provider may also prescribe an expectorant. This loosens the mucus to be coughed up.  Take medicines only as directed by your health care provider.  Do not smoke. Smoking is a common cause of bronchitis and can contribute to pneumonia. If you are a smoker and continue to smoke, your cough may last several weeks after your  pneumonia has cleared.  A cold steam vaporizer or humidifier in your room or home may help loosen mucus.  Coughing is often worse at night. Sleeping in a semi-upright position in a recliner or using a couple pillows under your head will help with this.  Get rest as you feel it is needed. Your body will usually let you know when you need to rest. PREVENTION A pneumococcal shot (vaccine) is available to prevent a common bacterial cause of pneumonia. This is usually suggested for:  People over 65 years old.  Patients on chemotherapy.  People with chronic lung problems, such as bronchitis or emphysema.  People with immune system problems. If you are over 65 or have a high risk condition, you may receive the pneumococcal vaccine if you have not received it before. In some countries, a routine influenza vaccine is also recommended. This vaccine can help prevent some cases of pneumonia.You may be offered the influenza vaccine as part of your care. If you smoke, it is time to quit. You may receive instructions on how to stop smoking. Your health care provider can provide medicines and counseling to help you quit. SEEK MEDICAL CARE IF: You have a fever. SEEK IMMEDIATE MEDICAL CARE IF:   Your illness becomes worse. This is especially true if you are elderly or weakened from any other disease.  You cannot control your cough with suppressants and are losing sleep.  You begin coughing up blood.  You develop pain which is getting worse or is uncontrolled with medicines.  Any of the symptoms   which initially brought you in for treatment are getting worse rather than better.  You develop shortness of breath or chest pain. MAKE SURE YOU:   Understand these instructions.  Will watch your condition.  Will get help right away if you are not doing well or get worse. Document Released: 02/24/2005 Document Revised: 07/11/2013 Document Reviewed: 05/16/2010 [facility] Patient Information 2015  [place], [place]. This information is not intended to replace advice given to you by your health care provider. Make sure you discuss any questions you have with your health care provider.  Esophagitis Esophagitis is inflammation of the esophagus. It can involve swelling, soreness, and pain in the esophagus. This condition can make it difficult and painful to swallow. CAUSES  Most causes of esophagitis are not serious. Many different factors can cause esophagitis, including:  Gastroesophageal reflux disease (GERD). This is when acid from your stomach flows up into the esophagus.  Recurrent vomiting.  An allergic-type reaction.  Certain medicines, especially those that come in large pills.  Ingestion of harmful chemicals, such as household cleaning products.  Heavy alcohol use.  An infection of the esophagus.  Radiation treatment for cancer.  Certain diseases such as sarcoidosis, Crohn's disease, and scleroderma. These diseases may cause recurrent esophagitis. SYMPTOMS   Trouble swallowing.  Painful swallowing.  Chest pain.  Difficulty breathing.  Nausea.  Vomiting.  Abdominal pain. DIAGNOSIS  Your caregiver will take your history and do a physical exam. Depending upon what your caregiver finds, certain tests may also be done, including:  Barium X-ray. You will drink a solution that coats the esophagus, and X-rays will be taken.  Endoscopy. A lighted tube is put down the esophagus so your caregiver can examine the area.  Allergy tests. These can sometimes be arranged through follow-up visits. TREATMENT  Treatment will depend on the cause of your esophagitis. In some cases, steroids or other medicines may be given to help relieve your symptoms or to treat the underlying cause of your condition. Medicines that may be recommended include:  Viscous lidocaine, to soothe the esophagus.  Antacids.  Acid reducers.  Proton pump inhibitors.  Antiviral medicines for certain  viral infections of the esophagus.  Antifungal medicines for certain fungal infections of the esophagus.  Antibiotic medicines, depending on the cause of the esophagitis. HOME CARE INSTRUCTIONS   Avoid foods and drinks that seem to make your symptoms worse.  Eat small, frequent meals instead of large meals.  Avoid eating for the 3 hours prior to your bedtime.  If you have trouble taking pills, use a pill splitter to decrease the size and likelihood of the pill getting stuck or injuring the esophagus on the way down. Drinking water after taking a pill also helps.  Stop smoking if you smoke.  Maintain a healthy weight.  Wear loose-fitting clothing. Do not wear anything tight around your waist that causes pressure on your stomach.  Raise the head of your bed 6 to 8 inches with wood blocks to help you sleep. Extra pillows will not help.  Only take over-the-counter or prescription medicines as directed by your caregiver. SEEK IMMEDIATE MEDICAL CARE IF:  You have severe chest pain that radiates into your arm, neck, or jaw.  You feel sweaty, dizzy, or lightheaded.  You have shortness of breath.  You vomit blood.  You have difficulty or pain with swallowing.  You have bloody or black, tarry stools.  You have a fever.  You have a burning sensation in the chest more than  3 times a week for more than 2 weeks.  You cannot swallow, drink, or eat.  You drool because you cannot swallow your saliva. MAKE SURE YOU:  Understand these instructions.  Will watch your condition.  Will get help right away if you are not doing well or get worse. Document Released: 04/03/2004 Document Revised: 05/19/2011 Document Reviewed: 10/25/2010 [facility] Patient Information 2015 [place], [place]. This information is not intended to replace advice given to you by your health care provider. Make sure you discuss any questions you have with your health care provider.

## 2014-09-12 ENCOUNTER — Encounter: Payer: Self-pay | Admitting: *Deleted

## 2014-09-12 NOTE — Progress Notes (Signed)
[place] Work  Clinical Social Work was referred by nurse for assessment of psychosocial needs due to possible help at home with cleaning and managing her medications.  Per RN, pt lives with her husband and stepson. Clinical Social Worker reviewed chart. Pt has MCD, but does not appear to need HH currently to help manage medications. CSW contacted patient at home to offer support and assess for needs. CSW left message for pt stating there were not resources for house cleaning. Pt could possibly qualify for PCS/CNA when she is no longer able to provide self care and needs assistance with ADLs.     Clinical Social Work interventions: Resource education  Loren Racer, [place] Worker [place]  [place] Phone: [phone] Fax: [phone]

## 2014-09-12 NOTE — Progress Notes (Signed)
[place] Work  Clinical Social Work received return call from pt. Pt reports she has temporary custody of her 46yo grandson. Pt is worried about caring for him when she starts back with chemo in the next month. CSW further explained how home health and CNA services work. Pt stated understanding and appeared to realize that there had to be a medical need to receive these services. She reports to use [facility] and also MCD for transportation. Pt plans to see if DSS can help with sitter during next chemo round. Pt aware to keep [place] in the loop if her needs change. Pt also plans to complete ADRs with her CSW, Ander Purpura and agrees to make appt in the coming weeks. Pt appreciated info today.   Clinical Social Work interventions: Resource education  Loren Racer, [place] Worker [place]  [place] Phone: [phone] Fax: [phone]

## 2014-09-13 ENCOUNTER — Telehealth: Payer: Self-pay

## 2014-09-13 LAB — CULTURE, BLOOD (ROUTINE X 2)
CULTURE: NO GROWTH
Culture: NO GROWTH

## 2014-09-13 MED ORDER — HYDROCODONE-ACETAMINOPHEN 5-325 MG PO TABS: 1.0000 | ORAL_TABLET | Freq: Four times a day (QID) | ORAL | Status: DC | PRN

## 2014-09-13 NOTE — Telephone Encounter (Signed)
Destiny [name] called for his mother. Requesting hydrocodone-apap 5-325. Received #10 on 7/4 from Dr Algis Liming, received #45 on 6/28 from Selena Lesser NP. Rx is 1-2 q 6 hr prn. She is using about 8 tabs per day. Palliative care was called in to help handle chronic pain issues per Selena Lesser.

## 2014-09-13 NOTE — Telephone Encounter (Signed)
Pt was unsure who to call for her hydrocodone. Hydrocodone is only lasting 4 hours even when she takes 2 tablets. What they were doing in the hospital was keeping her comfortable. Pt does not have contact information for her palliative care coordinator. Call [name] [phone].

## 2014-09-13 NOTE — Telephone Encounter (Signed)
We Rx her pain medications for now if appropriate until she sees the home palliative care PA.

## 2014-09-13 NOTE — Telephone Encounter (Addendum)
Reviewed pt refill request with MD. Per Dr. Julien Nordmann ok to refill Hydrocodone 5 -325 mg 1-2 tablets q 6hours pain. Call will be placed to [place] NP [facility] [employer]. Called pt unable to reach [facility] Rx ready for pick up.

## 2014-09-13 NOTE — Addendum Note (Signed)
Addended by: Lucile Crater on: 09/13/2014 05:16 PM   Modules accepted: Orders

## 2014-09-14 NOTE — Telephone Encounter (Signed)
err

## 2014-09-19 ENCOUNTER — Telehealth: Payer: Self-pay | Admitting: *Deleted

## 2014-09-19 NOTE — Telephone Encounter (Signed)
CONTACTED PT. SHE HAS SIX PILLS LEFT. LAST FILLED ON 09/13/14 FOR #60. CALL WHEN PRESCRIPTION IS READY.

## 2014-09-20 ENCOUNTER — Telehealth: Payer: Self-pay | Admitting: *Deleted

## 2014-09-20 DIAGNOSIS — C3490 Malignant neoplasm of unspecified part of unspecified bronchus or lung: Secondary | ICD-10-CM

## 2014-09-20 MED ORDER — HYDROCODONE-ACETAMINOPHEN 5-325 MG PO TABS: 1.0000 | ORAL_TABLET | Freq: Four times a day (QID) | ORAL | Status: DC | PRN

## 2014-09-20 NOTE — Telephone Encounter (Signed)
Refill Norco

## 2014-09-25 ENCOUNTER — Other Ambulatory Visit: Payer: Self-pay | Admitting: Medical Oncology

## 2014-09-25 ENCOUNTER — Telehealth: Payer: Self-pay

## 2014-09-25 DIAGNOSIS — C3490 Malignant neoplasm of unspecified part of unspecified bronchus or lung: Secondary | ICD-10-CM

## 2014-09-25 MED ORDER — HYDROCODONE-ACETAMINOPHEN 5-325 MG PO TABS: 1.0000 | ORAL_TABLET | Freq: Four times a day (QID) | ORAL | Status: DC | PRN

## 2014-09-25 NOTE — Progress Notes (Signed)
Pt notified via voice message that rx hydrocodone will be ready for pick up tomorrow.

## 2014-09-25 NOTE — Telephone Encounter (Signed)
Pt called for pain pill refill. She has 6 left. Norco last filled 7/13 #60. Call pt when rx ready

## 2014-09-26 ENCOUNTER — Telehealth: Payer: Self-pay | Admitting: Radiation Oncology

## 2014-09-26 ENCOUNTER — Encounter: Payer: Self-pay | Admitting: *Deleted

## 2014-09-26 ENCOUNTER — Telehealth: Payer: Self-pay | Admitting: *Deleted

## 2014-09-26 NOTE — Telephone Encounter (Signed)
Noted patient was double book to see Dr. Tammi Klippel once on 7/21 and again on 7/27. Cancelled 7/21 follow up. Phoned patient instructing her to present for on the 7/27 follow up after her MRI on 7/26 to review results.

## 2014-09-26 NOTE — Telephone Encounter (Signed)
I talked to [name] and reiterated that pt is done with chemo and radiation and if she is having more pain she needs to see her PCP and be evaluated . I also reinforced that radiation esophagitis pain should resolve 2 weeks after completing the treatment. Pt seeing Tammi Klippel this week for f/u. Son stated okay.

## 2014-09-26 NOTE — Telephone Encounter (Signed)
Received VM message from pt's son, Destiny [name] regarding future pain medication refills.  Dr. Worthy Flank nurse, diane to call him back.

## 2014-09-26 NOTE — Progress Notes (Signed)
Kili here to pick up prescription for pain medication.  Note attached to Rx that if she needs any further pain medicine that she would need to call her PCP.  Her chemotherapy and radiation treatments were completed in June.  She got upset and stated that she was going to get another oncologist who would give her her pain medication.  States that she was told that she would hurt for 4 weeks after her radiation was completed. She asked for a number to call about getting another MD.  Hanley Seamen her main number here to call.

## 2014-09-27 ENCOUNTER — Encounter: Payer: Self-pay | Admitting: Radiation Therapy

## 2014-09-27 ENCOUNTER — Telehealth: Payer: Self-pay | Admitting: *Deleted

## 2014-09-27 ENCOUNTER — Telehealth: Payer: Self-pay | Admitting: Internal Medicine

## 2014-09-27 ENCOUNTER — Telehealth: Payer: Self-pay | Admitting: Medical Oncology

## 2014-09-27 ENCOUNTER — Other Ambulatory Visit: Payer: Self-pay | Admitting: *Deleted

## 2014-09-27 DIAGNOSIS — C3411 Malignant neoplasm of upper lobe, right bronchus or lung: Secondary | ICD-10-CM

## 2014-09-27 NOTE — Telephone Encounter (Signed)
Patient will see Dr. Mindi Junker @ [facility] 07/27 @ 9. Per Jackelyn Poling will mail out direction on tomorrow.  [phone]([phone])

## 2014-09-27 NOTE — Progress Notes (Signed)
Jenafer called me today to ask if Dr. Tammi Klippel can refer her to [facility] for the remainder of her oncology care. I asked what this was concerning and she stated that she has discussed her pain medication with Dr. Julien Nordmann several times stating that the Rx is written for her to take it every 6 hours, but her pain returns every 4 hours so that is how she has been taking it. When she runs out she is made to feel that she is abusing her pain medication and she is tired of that attitude toward her. She has been given the "run around" as to who she needs to speak with about her medication and then finally told that she should no longer call on Dr. Julien Nordmann to fill her medication because she has completed with her Radiation and Chemo. She has been advised to call her PCP for future refills and pain medication needs. This has upset her and she feels that it is time for her to start seeing someone else.        I asked if she was interested in switching her Medical Oncology care only and reminded her that she has a MRI and visit with Dr. Tammi Klippel coming up next week to follow-up on her treated brain lesions. She said that she will have the MRI and come to see Dr. Tammi Klippel, but is still interested in switching her Medical Oncology care.   Mont Dutton R.T. (R) (T) Radiation Special Procedures [place] [phone] Office [phone] Pager [phone] Fax Manuela [email or web address]

## 2014-09-27 NOTE — Telephone Encounter (Signed)
Per 07/20 POF, referral request sent to Medical Records for consult at [facility]... KJ

## 2014-09-27 NOTE — Telephone Encounter (Signed)
I told pt to call me when she gets a referral and whether or not she wants to keep appt with [facility]. She said she would.

## 2014-09-27 NOTE — Telephone Encounter (Signed)
Oncology Nurse Navigator Documentation  Oncology Nurse Navigator Flowsheets 09/27/2014  Navigator Encounter Type Telephone/I received message from Rad Onc that patient would like to get her medical oncology treatment in [place]. I called patient to see if I can help.  I left my name and vm message to call   Patient Visit Type Follow-up  Treatment Phase Treatment  Barriers/Navigation Needs Education  Time Spent with Patient 15

## 2014-09-28 ENCOUNTER — Ambulatory Visit: Payer: Medicaid Other | Admitting: Radiation Oncology

## 2014-09-29 ENCOUNTER — Telehealth: Payer: Self-pay | Admitting: Internal Medicine

## 2014-09-29 NOTE — Telephone Encounter (Signed)
Referral [facility] Dr. Shirleen Schirmer @ 9 Aug 8 patient is aware.

## 2014-10-02 ENCOUNTER — Telehealth: Payer: Self-pay | Admitting: *Deleted

## 2014-10-02 ENCOUNTER — Other Ambulatory Visit: Payer: Self-pay | Admitting: Radiation Oncology

## 2014-10-02 ENCOUNTER — Encounter: Payer: Self-pay | Admitting: *Deleted

## 2014-10-02 DIAGNOSIS — C3411 Malignant neoplasm of upper lobe, right bronchus or lung: Secondary | ICD-10-CM

## 2014-10-02 MED ORDER — SUCRALFATE 1 GM/10ML PO SUSP: 1.0000 g | Freq: Three times a day (TID) | ORAL | Status: DC

## 2014-10-02 NOTE — Telephone Encounter (Signed)
Returned Destiny [name]'s call and discovered that she is actually experiencing pain in the esophageal region with burning and pain upon swallowing which is based on the acidity and temperature of foods and liquids she is ingesting. She also reported that she is taking Decadron 1 mg po daily, tapered dose.  She admits to taking Hydrocodone  5-'325mg'$  tabs 2 tabs every 4 hours due to unrelieved esophageal discomfort which is outside of the prescribed parameter of 1-2 tabs q 6 hours, therefore, the script given on 09/26/14 for 60 tabs is exhausted at this time.  When asked, she stated she has a script for Protonix 40 mg tabs 2 po daily, but is only taking one daily.  Advised to check her mouth and back of throat during this telephone encounter for "whitish" areas which was negative for thrush.  Also advised her to call her pharmacist to ask for a drug review to determine if permissable for her to take 2 Protonix daily, per her current script to ensure there are no drug interactions with her current regimen since she seemed reluctant to do so.  She stated agreement.    Reported to Dr. Tammi Klippel the above information and he stated he would prescribe Carafate for the esophageal discomfort.  Called to leave message for script but no answer, therefore, left a message regarding script.  Will call in the am.  Ms. Tipping has an appt. With Dr. Tammi Klippel on 10/04/14

## 2014-10-02 NOTE — Progress Notes (Signed)
[place] Work  Clinical Social Work received phone call from patient.  Patient was tearful on phone and stated she was upset with "how she has been treated".  Patient shared she has felt disrespected throughout entire cancer experience.  Ms. Mosco reported she was unable to receive pain medications from clinic for what she felt was "cancer related".  (Related to note from Mont Dutton on 09/27/14) CSW provided brief emotional support and discussed process for utilizing cancer physicians as well as primary care physicians.  The patient shared she has set up appointment at [facility], but feels she's being "pushed out" and wants the opportunity to report her experience to somebody "higher up". CSW agreed to make referral to Medical Director/Director of Nursing to give her opportunity to share her concerns with leadership.CSW encouraged patient to call CSW in the future for emotional support and resources.  Polo Riley, MSW, LCSW, OSW-C Clinical Social Worker [facility] [phone]

## 2014-10-02 NOTE — Telephone Encounter (Signed)
Patient called requesting pain medication,. She knows she has finished radiation and chemo, said she called Dr. Julien Nordmann office and told to  Call Dr. Tammi Klippel, I informd her she has a appt this Wednesday with Dr. Tammi Klippel, he is out of the office today and tomorrow, and read Mont Dutton last note that patient is to get any more pain medications from her Primary MD, she started yelling at me and wouldn't listen, I told her I would e-mail Dr. Tammi Klippel, bnut he wouldn't write another pain med rx , her last rx was written by Dr,.Mohamed on 09/25/14 with 60 tablets, to take 1 every 6 hours, and too soon to refill, she kept yelling on the phone " and asked to speak with Samanths, RN, infomred her Inocente Salles was on Vacation this week. She kept insisting we write her a pain rx, yelling  Over and over ,,so Finally I said I'm sorry and hung up

## 2014-10-03 ENCOUNTER — Telehealth: Payer: Self-pay | Admitting: *Deleted

## 2014-10-03 ENCOUNTER — Telehealth: Payer: Self-pay | Admitting: Medical Oncology

## 2014-10-03 ENCOUNTER — Inpatient Hospital Stay: Admission: RE | Admit: 2014-10-03 | Payer: Medicaid Other | Source: Ambulatory Visit

## 2014-10-03 ENCOUNTER — Telehealth: Payer: Self-pay | Admitting: Internal Medicine

## 2014-10-03 ENCOUNTER — Encounter: Payer: Self-pay | Admitting: Radiation Therapy

## 2014-10-03 ENCOUNTER — Encounter: Payer: Self-pay | Admitting: *Deleted

## 2014-10-03 ENCOUNTER — Ambulatory Visit: Payer: Medicaid Other

## 2014-10-03 DIAGNOSIS — C3490 Malignant neoplasm of unspecified part of unspecified bronchus or lung: Secondary | ICD-10-CM

## 2014-10-03 NOTE — Telephone Encounter (Addendum)
Pain mangement-Pt called and stated she is in too much pain to get MRI . She is out of her hydrocodone rx ( 09/26/14) . I asked her if she picked up her carafate today . She said she did not pick up carafate rx today because she had some previous rx.  . I asked her if she had been taking it as prescribed and she said she was. Today , pt took a  rx to [facility] for hydrocodone ( 5/325 1 q 6 prn #45  from Gwenlyn Perking, [place] ) . Pt called me  and said [facility] not [facility].  [facility] called [facility] and was told someone called and told their pharmacist not to fill rx because it was a day early. So they did not fill it.  I called [facility] and they will fill it. [facility]  said pt can pick up rx and take it to [facility] . Pt notified and said she will [facility] to [facility].  Pt understands to f/u with PCP regarding her pain.  MRI-Pt said she will r/s her her MRI . Thayer Headings in xrt notified that pt is not getting MRI today. Referral-Pt also has appt at [facility] in aug to see oncology -per pt request for referral.

## 2014-10-03 NOTE — Telephone Encounter (Signed)
Patient called and spoke to [employer], CSW, she was concerned about the way she was treated at [facility].  I called the patient to discuss her concerns.  She voiced concerns about having pain in the "middle of her chest" that has not resolved since her radiation treatments and that she was being referred back to her PCP for pain management.    I verified with the patient that she had received a prescription for her pain medication.  She stated she had.  According to the patient, her PCP called something in but [facility] was unable to fill it due to it being to early from her last refill.  Patient stated her husband picked up the prescription the next day.  I also saw in the patients chart that she was being managed by both Medical Oncology and Radiation Oncology, Dr. Tammi Klippel ordered Carafate Suspension on 7/26, I instructed the patient if able to get that filled, that it could help with the pain in her "chest area".  She described the pain as burning after eating and drinking water.  She stated understanding and was going to get that filled.  She also reported taking Ibuprofen in between pain medications to see if that would help.  Patient was very appreciative of me calling her back.  I encouraged her to reach out to [employer] she had concerns related to her continued care.  I also encouraged her to contact Radiation Oncology for concerns related to her radiation treatments.

## 2014-10-03 NOTE — Progress Notes (Signed)
Called to check on Destiny [name].  She reported that she was called by an unknown Radiation Oncology Nurse today who informed her that she should be able to pick up her script refill for pain meds on tomorrow.  Encouraged her to continue Carafate QID and Protonix BID as instructed even after she picks up her pain medications tomorrow.  Explained again that Carafate coats her esophagus and will sooth and decrease her discomfort, in addition to the BID Protonix.  She stated compliance.  Will continue to monitor her status.

## 2014-10-03 NOTE — Telephone Encounter (Signed)
Per Abelina Bachelor voice message  Today, Patient didn't go and have her MRI today stated too much pain, Patric Dykes, RN informed her to take her carafate 4x day religiously for her esophagitis symptoms, I called this rx in  Early thi9s am, but patient old Diane she still had some of carafate but needed her pain medication, left Susna Boyles voicemessage, patient f/u appt tomorrow with Dr. Tammi Klippel Will inbasket both 2:49 PM

## 2014-10-03 NOTE — Telephone Encounter (Signed)
[facility] [phone], left voice message for  rx for  Sucralfate(Carafate) 1gm/61m suspension, take 137m1gm total) by mouth 4x day with meals and at bedtime, dispense 420 ml, no refills per Dr. MaTyler Pita:57 AM

## 2014-10-03 NOTE — Telephone Encounter (Signed)
Manuela Schwartz from radiology oncology called to report pt had called wanting a pain medication refill. Manuela Schwartz wanted to check with our office to see if Cyndee would be able to see pt. Upon chart review, brought information to Cyndee's attention. She to discuss with Dr. Julien Nordmann. Manuela Schwartz at ext. [place]

## 2014-10-03 NOTE — Addendum Note (Signed)
Addended by: Ardeen Garland on: 10/03/2014 01:38 PM   Modules accepted: Medications

## 2014-10-04 ENCOUNTER — Encounter: Payer: Self-pay | Admitting: Radiation Oncology

## 2014-10-04 ENCOUNTER — Telehealth: Payer: Self-pay | Admitting: *Deleted

## 2014-10-04 ENCOUNTER — Ambulatory Visit
Admission: RE | Admit: 2014-10-04 | Discharge: 2014-10-04 | Disposition: A | Discharge status: Home / Self Care | Payer: Medicaid Other | Source: Ambulatory Visit | Attending: Radiation Oncology | Admitting: Radiation Oncology

## 2014-10-04 VITALS — BP 98/59 | HR 112 | Temp 98.2°F | Resp 12 | Wt 198.0 lb

## 2014-10-04 DIAGNOSIS — J189 Pneumonia, unspecified organism: Secondary | ICD-10-CM | POA: Diagnosis not present

## 2014-10-04 DIAGNOSIS — Z08 Encounter for follow-up examination after completed treatment for malignant neoplasm: Secondary | ICD-10-CM | POA: Insufficient documentation

## 2014-10-04 DIAGNOSIS — Z79899 Other long term (current) drug therapy: Secondary | ICD-10-CM | POA: Diagnosis not present

## 2014-10-04 DIAGNOSIS — C7931 Secondary malignant neoplasm of brain: Secondary | ICD-10-CM

## 2014-10-04 MED ORDER — RADIAPLEXRX EX GEL
Freq: Once | CUTANEOUS | Status: AC
  Administered 2014-10-04: 17:00:00 via TOPICAL

## 2014-10-04 MED ORDER — GADOBENATE DIMEGLUMINE 529 MG/ML IV SOLN
18.0000 mL | Freq: Once | INTRAVENOUS | Status: AC | PRN
  Administered 2014-10-04: 18 mL via INTRAVENOUS

## 2014-10-04 NOTE — Telephone Encounter (Signed)
Pain medication problem - pt to take rx to [facility].

## 2014-10-04 NOTE — Progress Notes (Signed)
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 Brain metastases 198.3 C79.31     Interval Since Last Radiation:  1  months  Narrative:  The patient returns today for routine follow-up.  Pt was discharged from hospital on 09/11/14 for pneumonia, was on Levaquin. Using Radiaplex, given refill. Pt denies dysphagia. Reports burning when swallowing/eating. Pt complains of fatigue, loss of sleep and poor appetite. Pt denies headaches.  Pt reports she is feeling "terrible". She was not able to have her MRI done yesterday due to pain; she rescheduled to have it done this morning, 10/04/14. Pt has chemo next month and she is scheduled for a chest CT scan this Friday, 10/06/14.                               ALLERGIES:  has No Known Allergies.  Meds: Current Outpatient Prescriptions  Medication Sig Dispense Refill  . dexamethasone (DECADRON) 1 MG tablet Take 2 mg by mouth daily with breakfast.   0  . fluconazole (DIFLUCAN) 200 MG tablet Take 1 tablet (200 mg total) by mouth daily. (Patient not taking: Reported on 10/04/2014) 11 tablet 0  . guaiFENesin-dextromethorphan (ROBITUSSIN DM) 100-10 MG/5ML syrup Take 5 mLs by mouth every 6 (six) hours as needed for cough. 118 mL 0  . HYDROcodone-acetaminophen (NORCO/VICODIN) 5-325 MG per tablet Take 1-2 tablets by mouth every 6 (six) hours as needed for moderate pain or severe pain. 60 tablet 0  . levofloxacin (LEVAQUIN) 750 MG tablet Take 1 tablet (750 mg total) by mouth daily. (Patient not taking: Reported on 10/04/2014) 4 tablet 0  . lidocaine (XYLOCAINE) 2 % solution Use as directed 10 mLs in the mouth or throat  every 4 (four) hours as needed for mouth pain. 100 mL 1  . lidocaine-prilocaine (EMLA) cream Apply topically 2 (two) times daily as needed (painful skin on chest). 30 g 0  . LORazepam (ATIVAN) 1 MG tablet Take 1 mg by mouth every 8 (eight) hours as needed for anxiety.   0  . mirtazapine (REMERON) 30 MG tablet Take 1 tablet (30 mg total) by mouth at bedtime. 30 tablet 2  . nicotine (NICODERM CQ - DOSED IN MG/24 HR) 7 mg/24hr patch Place 1 patch (7 mg total) onto the skin daily. 28 patch 0  . pantoprazole (PROTONIX) 40 MG tablet Take 40 mg by mouth 2 (two) times daily before a meal.     . phenol (CHLORASEPTIC) 1.4 % LIQD Use as directed 1 spray in the mouth or throat as needed for throat irritation / pain. 20 mL 0  . prochlorperazine (COMPAZINE) 10 MG tablet Take 1 tablet (10 mg total) by mouth every 6 (six) hours as needed for nausea or vomiting. 30 tablet 0  . sucralfate (CARAFATE) 1 GM/10ML suspension Take 10 mLs (1 g total) by mouth 4 (four) times daily -  with meals and at bedtime. 420 mL 0  . traMADol (ULTRAM) 50 MG tablet Take 1 tablet (50 mg total) by mouth 2 (two) times daily. (Patient not taking:  Reported on 10/04/2014) 10 tablet 0   No current facility-administered medications for this encounter.    Physical Findings: The patient is in no acute distress. Patient is alert and oriented.  weight is 198 lb (89.812 kg). Her oral temperature is 98.2 F (36.8 C). Her blood pressure is 98/59 and her pulse is 112. Her respiration is 12 and oxygen saturation is 100%. . Noted hyperpigmentation and dryness to upper central/right posterior shoulder.  No significant changes.  Lab Findings: Lab Results  Component Value Date   WBC 3.3* 09/10/2014   WBC 4.6 08/28/2014   HGB 10.1* 09/10/2014   HGB 12.8 08/28/2014   HCT 28.8* 09/10/2014   HCT 37.7 08/28/2014   PLT 176 09/10/2014   PLT 233 08/28/2014    Lab Results  Component Value Date   NA 138 09/10/2014   NA 136 08/28/2014   K 3.8  09/10/2014   K 4.1 08/28/2014   CHLORIDE 103 08/28/2014   CO2 21* 09/10/2014   CO2 26 08/28/2014   GLUCOSE 101* 09/10/2014   GLUCOSE 97 08/28/2014   BUN <5* 09/10/2014   BUN 11.9 08/28/2014   CREATININE 0.58 09/10/2014   CREATININE 0.8 08/28/2014   BILITOT 0.4 09/08/2014   BILITOT 0.27 08/28/2014   ALKPHOS 83 09/08/2014   ALKPHOS 79 08/28/2014   AST 29 09/08/2014   AST 19 08/28/2014   ALT 33 09/08/2014   ALT 27 08/28/2014   PROT 7.9 09/08/2014   PROT 7.2 08/28/2014   ALBUMIN 3.8 09/08/2014   ALBUMIN 3.7 08/28/2014   CALCIUM 8.9 09/10/2014   CALCIUM 9.6 08/28/2014   ANIONGAP 10 09/10/2014   ANIONGAP 7 08/28/2014    Radiographic Findings: Dg Chest 2 View  09/08/2014   CLINICAL DATA:  Productive cough and shortness of breath for the past 3 days; onset of fever today. Long history of tobacco use  EXAM: CHEST  2 VIEW  COMPARISON:  PA and lateral chest x-ray of September 05, 2014  FINDINGS: The lungs are adequately inflated. The interstitial markings have become more conspicuous especially on the left. There is no alveolar infiltrate. There is no pleural effusion. The heart and pulmonary vascularity are normal. The mediastinum is normal in width. The bony thorax is unremarkable.  IMPRESSION: Increased pulmonary interstitial markings predominantly on the left are worrisome for early pneumonia. Stable density in the right upper lobe is noted consistent with known pulmonary nodules.   Electronically Signed   By: David  Martinique M.D.   On: 09/08/2014 17:18   Dg Chest 2 View  09/05/2014   CLINICAL DATA:  History of lung carcinoma, currently under treatment with radiation, productive cough and increasing shortness of breath  EXAM: CHEST  2 VIEW  COMPARISON:  PET-CT of 06/23/2014, chest x-ray of 06/14/2014, and chest x-ray of 12/08/2013  FINDINGS: The nodular areas in the right mid lung noted on CT of the chest appear less prominent presumably in response to the therapy. No infiltrate or effusion is  seen and no new lung nodule is noted. Mediastinal and hilar contours are unchanged. The heart is within normal limits in size. No bony abnormality is seen.  IMPRESSION: The nodules in the right mid lung are less prominent most likely in response to interval treatment. No new nodular opacity is seen.   Electronically Signed   By: Ivar Drape M.D.   On: 09/05/2014 16:13   Mr Jeri Cos TD Contrast  10/04/2014   CLINICAL DATA:  Metastatic lung cancer. Stereotactic radiosurgery treatment 07/03/2014.  EXAM: MRI HEAD WITHOUT AND WITH CONTRAST  TECHNIQUE: Multiplanar, multiecho pulse sequences of the brain and surrounding structures were obtained without and with intravenous contrast.  CONTRAST:  25m MULTIHANCE GADOBENATE DIMEGLUMINE 529 MG/ML IV SOLN  COMPARISON:  MRI 06/20/2014  FINDINGS: Left posterior temporal metastatic deposit is smaller now measuring 8 x 9 mm compared with 12 x 14 mm previously. Decreased in surrounding vasogenic edema since the prior study.  New lesion in the right temporoparietal lobe. This is superficial extends to the cortex. This lesion measures 10 x 11 mm and was not present previously. There is mild surrounding edema which was not present previously.  No other enhancing lesions identified.  No other areas of edema  Ventricle size normal.  No acute infarct.  Mild mucosal edema in the paranasal sinuses. Orbit intact. Calvarium intact.  IMPRESSION: Interval improvement in treated left posterior temporal metastatic deposit  New metastatic disease right temporoparietal lobe measuring 10 x 11 mm with mild surrounding edema. No other lesions.   Electronically Signed   By: CFranchot GalloM.D.   On: 10/04/2014 11:01    Impression:  The patient is recovering from the effects of radiation.  44yo woman with a new isolated right parietal brain metastasis measuring 11 mm.  Her previously treated left temporal brain met is [facility] in [facility]  At this point, the patient would  potentially benefit from radiotherapy. The options include whole brain irradiation versus stereotactic radiosurgery. There are pros and cons associated with each of these potential treatment options. Whole brain radiotherapy would treat the known metastatic deposits and help provide some reduction of risk for future brain metastases. However, whole brain radiotherapy carries potential risks including hair loss, subacute somnolence, and neurocognitive changes including a possible reduction in short-term memory. Whole brain radiotherapy also may carry a lower likelihood of tumor control at the treatment sites because of the low-dose used. Stereotactic radiosurgery carries a higher likelihood for local tumor control at the targeted sites with lower associated risk for neurocognitive changes such as memory loss. However, the use of stereotactic radiosurgery in this setting may leave the patient at increased risk for new brain metastases elsewhere in the brain as high as 50-60%. Accordingly, patients who receive stereotactic radiosurgery in this setting should undergo ongoing surveillance imaging with brain MRI more frequently in order to identify and treat new small brain metastases before they become symptomatic. Stereotactic radiosurgery does carry some different risks, including a risk of radionecrosis.  PLAN: Today, I reviewed the findings and workup thus far with the patient. We discussed the dilemma regarding whole brain radiotherapy versus stereotactic radiosurgery. We discussed the pros and cons of each. We also discussed the logistics and delivery of each. We reviewed the results associated with each of the treatments described above. The patient seems to understand the treatment options and would like to proceed with stereotactic radiosurgery.  I spent 10 minutes minutes face to face with the patient and more than 50% of that time was spent in counseling and/or coordination of care.   This document  serves as a record of services personally performed by MTyler Pita MD. It was created on his behalf by EArlyce Harman a trained medical scribe. The creation of this record is based on the scribe's personal observations and the provider's statements to them. This document has been checked and approved by the attending provider.    _____________________________________  MSheral Apley MTammi Klippel M.D.

## 2014-10-04 NOTE — Progress Notes (Addendum)
PAIN: She is currently in no pain. However at approximately 7:30am this morning she reports pain of 10/10 central chest, described as "burning" She took Norco with relief. RESPIRATORY: Coughing  Productive and Color of Phlegm  yellow. Pt was discharged from hospital on 09/11/14 for pneumonia, was on Levaquin. Noted hyperpigmentation and dryness to upper central/right posterior shoulder. Using Radiaplex, given refill. SWALLOWING/DIET: Pt denies dysphagia. Reports burning when swallowing/eating. OTHER: Pt complains of fatigue, loss of sleep and poor appetite. Pt is currently smoking, she would like patches to quit again, since she had success with them in the past. Denies headaches, nausea or vomiting.  A&ox3, gait-steady.  Pt reports swelling to bilateral lower ankles which started over the weekend.  She expressed depression but reports she has been in contact with our social workers.   BP 98/59 mmHg  Pulse 112  Temp(Src) 98.2 F (36.8 C) (Oral)  Resp 12  Wt 198 lb (89.812 kg)  SpO2 100%  LMP 08/06/2014 Wt Readings from Last 3 Encounters:  10/04/14 198 lb (89.812 kg)  10/04/14 191 lb (86.637 kg)  09/08/14 201 lb 4.5 oz (91.3 kg)

## 2014-10-04 NOTE — Addendum Note (Signed)
Encounter addended by: Jenene Slicker, RN on: 10/04/2014  4:44 PM<BR>     Documentation filed: Dx Association, Inpatient MAR, Orders

## 2014-10-04 NOTE — Telephone Encounter (Signed)
Oncology Nurse Navigator Documentation  Oncology Nurse Navigator Flowsheets 10/04/2014  Navigator Encounter Type Telephone/I called patient to follow up to see if she needed help with anything.  She stated she received some bad news today.  She stated her latest MRI Brain showed another lesion.  Sh was upset by this finding.  I listened as she explained.  I supported her and she explained her treatment plan.  I stated I would let Lauren, CSW know about her disease progression.  She said that would be ok.    Patient Visit Type Follow-up  Treatment Phase Abnormal Scans  Barriers/Navigation Needs Support  Interventions Other  Time Spent with Patient 30

## 2014-10-05 ENCOUNTER — Ambulatory Visit
Admission: RE | Admit: 2014-10-05 | Discharge: 2014-10-05 | Disposition: A | Discharge status: Home / Self Care | Payer: Medicaid Other | Source: Ambulatory Visit | Attending: Radiation Oncology | Admitting: Radiation Oncology

## 2014-10-05 VITALS — BP 96/62 | HR 113 | Temp 97.8°F | Ht 67.0 in | Wt 191.0 lb

## 2014-10-05 DIAGNOSIS — C3411 Malignant neoplasm of upper lobe, right bronchus or lung: Secondary | ICD-10-CM | POA: Diagnosis not present

## 2014-10-05 DIAGNOSIS — C7931 Secondary malignant neoplasm of brain: Secondary | ICD-10-CM

## 2014-10-05 MED ORDER — SODIUM CHLORIDE 0.9 % IJ SOLN
10.0000 mL | Freq: Once | INTRAMUSCULAR | Status: AC
  Administered 2014-10-05: 10 mL via INTRAVENOUS

## 2014-10-05 NOTE — Progress Notes (Signed)
  Radiation Oncology         [phone] ________________________________  Name: Destiny [name] MRN: [id number]  Date: 10/05/2014  DOB: 1970-06-20  SIMULATION AND TREATMENT PLANNING NOTE    ICD-9-CM ICD-10-CM   1. Primary cancer of right upper lobe of lung 162.3 C34.11   2. Brain metastases 198.3 C79.31     DIAGNOSIS:  44 yo woman with a new right parietal 11 mm brain metastasis  NARRATIVE:  The patient was brought to the [facility].  Identity was confirmed.  All relevant records and images related to the planned course of therapy were reviewed.  The patient freely provided informed written consent to proceed with treatment after reviewing the details related to the planned course of therapy. The consent form was witnessed and verified by the simulation staff. Intravenous access was established for contrast administration. Then, the patient was set-up in a stable reproducible supine position for radiation therapy.  A relocatable thermoplastic stereotactic head frame was fabricated for precise immobilization.  CT images were obtained.  Surface markings were placed.  The CT images were loaded into the planning software and fused with the patient's targeting MRI scan.  Then the target and avoidance structures were contoured.  Treatment planning then occurred.  The radiation prescription was entered and confirmed.  I have requested 3D planning  I have requested a DVH of the following structures: Brain stem, brain, left eye, right eye, lenses, optic chiasm, target volumes, uninvolved brain, and normal tissue.    SPECIAL TREATMENT PROCEDURE:  The planned course of therapy using radiation constitutes a special treatment procedure. Special care is required in the management of this patient for the following reasons. This treatment constitutes a Special Treatment Procedure for the following reason: High dose per fraction requiring special monitoring for increased toxicities of treatment  including daily imaging.  The special nature of the planned course of radiotherapy will require increased physician supervision and oversight to ensure patient's safety with optimal treatment outcomes.  PLAN:  The patient will receive 20 Gy in one fraction.  ________________________________  Sheral Apley Tammi Klippel, M.D.

## 2014-10-05 NOTE — Progress Notes (Signed)
D/i IV catheter from right fore arm, tip intact, placed 4x4 gause over  Site, and taped ,instructions given to keep dressing on till this afternoon, if when removing  Bleeding recurrs,place dresiing back over site and hold pressure and tape again,leave on overnight, patient gave verbal understanding, offered water to patient before d/c home,  10:53 AM

## 2014-10-05 NOTE — Progress Notes (Signed)
  Radiation Oncology         [phone] ________________________________  Stereotactic Treatment Procedure Note  Name: Destiny [name] MRN: [id number]  Date: 10/06/2014  DOB: [birth year]  SPECIAL TREATMENT PROCEDURE    ICD-9-CM ICD-10-CM   1. Brain metastases 198.3 C79.31     3D TREATMENT PLANNING AND DOSIMETRY:  The patient's radiation plan was reviewed and approved by neurosurgery and radiation oncology prior to treatment.  It showed 3-dimensional radiation distributions overlaid onto the planning CT/MRI image set.  The [facility] for the target structures as well as the organs at risk were reviewed. The documentation of the 3D plan and dosimetry are filed in the radiation oncology EMR.  NARRATIVE:  Destiny [name] was brought to the TrueBeam stereotactic radiation treatment machine and placed supine on the CT couch. The head frame was applied, and the patient was set up for stereotactic radiosurgery.  Neurosurgery was present for the set-up and delivery  SIMULATION VERIFICATION:  In the couch zero-angle position, the patient underwent Exactrac imaging using the Brainlab system with orthogonal KV images.  These were carefully aligned and repeated to confirm treatment position for each of the isocenters.  The Exactrac snap film verification was repeated at each couch angle.  PROCEDURE: Destiny [name] received stereotactic radiosurgery to the following targets: Right parietal 11 mm target was treated using 4 Dynamic Conformal Arcs to a prescription dose of 20 Gy.  ExacTrac registration was performed for each couch angle.  The 79.4% isodose line was prescribed.  6 MV X-rays were delivered in the flattening filter free beam mode.  STEREOTACTIC TREATMENT MANAGEMENT:  Following delivery, the patient was transported to nursing in stable condition and monitored for possible acute effects.  Vital signs were recorded . The patient tolerated treatment without significant acute effects, and was discharged  to home in stable condition.    PLAN: Follow-up in one month.  ________________________________  Sheral Apley. Tammi Klippel, M.D.

## 2014-10-06 ENCOUNTER — Ambulatory Visit
Admission: RE | Admit: 2014-10-06 | Discharge: 2014-10-06 | Disposition: A | Discharge status: Home / Self Care | Payer: Medicaid Other | Source: Ambulatory Visit | Attending: Radiation Oncology | Admitting: Radiation Oncology

## 2014-10-06 ENCOUNTER — Encounter: Payer: Self-pay | Admitting: Radiation Oncology

## 2014-10-06 ENCOUNTER — Ambulatory Visit (HOSPITAL_COMMUNITY)
Admission: RE | Admit: 2014-10-06 | Discharge: 2014-10-06 | Disposition: A | Discharge status: Home / Self Care | Payer: Medicaid Other | Source: Ambulatory Visit | Attending: Internal Medicine | Admitting: Internal Medicine

## 2014-10-06 VITALS — BP 109/76 | HR 103 | Temp 98.4°F

## 2014-10-06 DIAGNOSIS — Z08 Encounter for follow-up examination after completed treatment for malignant neoplasm: Secondary | ICD-10-CM | POA: Insufficient documentation

## 2014-10-06 DIAGNOSIS — C3411 Malignant neoplasm of upper lobe, right bronchus or lung: Secondary | ICD-10-CM | POA: Diagnosis not present

## 2014-10-06 DIAGNOSIS — C7931 Secondary malignant neoplasm of brain: Secondary | ICD-10-CM

## 2014-10-06 DIAGNOSIS — Z85118 Personal history of other malignant neoplasm of bronchus and lung: Secondary | ICD-10-CM | POA: Diagnosis not present

## 2014-10-06 MED ORDER — IOHEXOL 300 MG/ML  SOLN
100.0000 mL | Freq: Once | INTRAMUSCULAR | Status: AC | PRN
  Administered 2014-10-06: 100 mL via INTRAVENOUS

## 2014-10-06 NOTE — Progress Notes (Signed)
Destiny [name] is here for post [facility] monitoring.  She reports she has a slight headache that is improving.  She denies nausea, dizziness or vision changes.  Call light in reach and family present in room.  Will continue to monitor.

## 2014-10-06 NOTE — Op Note (Signed)
Stereotactic Radiosurgery Operative Note  Name: Destiny [name] MRN: [id number]  Date: 10/06/2014  DOB: 22-Sep-1970  Op Note  Pre Operative Diagnosis:  Metastatic lung cancer  Post Operative Diagnois: Metastatic lung cancer  3D TREATMENT PLANNING AND DOSIMETRY:  The patient's radiation plan was reviewed and approved by myself (neurosurgery) and Dr. Ledon Snare (radiation oncology) prior to treatment.  It showed 3-dimensional radiation distributions overlaid onto the planning CT/MRI image set.  The [facility] for the target structures as well as the organs at risk were reviewed. The documentation of the 3D plan and dosimetry are filed in the radiation oncology EMR.  NARRATIVE:  Destiny [name] was brought to the TrueBeam stereotactic radiation treatment machine and placed supine on the CT couch. The head frame was applied, and the patient was set up for stereotactic radiosurgery.  I was present for the set-up and delivery.  SIMULATION VERIFICATION:  In the couch zero-angle position, the patient underwent Exactrac imaging using the Brainlab system with orthogonal KV images.  These were carefully aligned and repeated to confirm treatment position for each of the isocenters.  The Exactrac snap film verification was repeated at each couch angle.  SPECIAL TREATMENT PROCEDURE: Destiny [name] received stereotactic radiosurgery to the following targets: Right parietal target was treated using 4 Dynamic Conformal Arcs to a prescription dose of 20 Gy.  ExacTrac registration was performed for each couch angle.  The 79.4% isodose line was prescribed.  STEREOTACTIC TREATMENT MANAGEMENT:  Following delivery, the patient was transported to nursing in stable condition and monitored for possible acute effects.  Vital signs were recorded LMP 09/05/2014. The patient tolerated treatment without significant acute effects, and was discharged to home in stable condition.    PLAN: Follow-up in one month.

## 2014-10-09 NOTE — Progress Notes (Signed)
  Radiation Oncology         [phone] ________________________________  Name: Destiny [name] MRN: [id number]  Date: 10/06/2014  DOB: 1970/06/03  End of Treatment Note    ICD-9-CM ICD-10-CM   1. Primary cancer of right upper lobe of lung 162.3 C34.11   2. Brain metastases 198.3 C79.31     DIAGNOSIS: 44 yo woman with a new right parietal 11 mm brain metastasis     Indication for treatment:  Palliation       Radiation treatment dates:   10/06/2014  Site/dose/beams/energy:   Right parietal 11 mm target was treated using 4 Dynamic Conformal Arcs to a prescription dose of 20 Gy.  ExacTrac registration was performed for each couch angle.  The 79.4% isodose line was prescribed.  6 MV X-rays were delivered in the flattening filter free beam mode  Narrative: The patient tolerated radiation treatment relatively well.     Plan: The patient has completed radiation treatment. The patient will return to radiation oncology clinic for routine followup in one month. I advised her to call or return sooner if she has any questions or concerns related to her recovery or treatment. ________________________________  Sheral Apley. Tammi Klippel, M.D.

## 2014-10-10 ENCOUNTER — Other Ambulatory Visit: Payer: Medicaid Other

## 2014-10-11 ENCOUNTER — Telehealth: Payer: Self-pay | Admitting: Radiation Oncology

## 2014-10-11 ENCOUNTER — Other Ambulatory Visit: Payer: Self-pay | Admitting: Medical Oncology

## 2014-10-11 ENCOUNTER — Encounter: Payer: Self-pay | Admitting: Radiation Oncology

## 2014-10-11 DIAGNOSIS — C3490 Malignant neoplasm of unspecified part of unspecified bronchus or lung: Secondary | ICD-10-CM

## 2014-10-11 MED ORDER — HYDROCODONE-ACETAMINOPHEN 5-325 MG PO TABS: 1.0000 | ORAL_TABLET | Freq: Four times a day (QID) | ORAL | Status: DC | PRN

## 2014-10-11 NOTE — Telephone Encounter (Signed)
Patient reported during our conversation this morning that she is out of her pain medication. Understanding that the patient was instructed by her former medical oncologist, Dr. Julien Nordmann, to have her pain medication refilled by her PCP this RN instructed her to call her PCP. Patient states, "they won't fill it." Phoned Gaston Islam, RN for FNP, Gwenlyn Perking to inquire. She reports the patient has been a no show for five consecutive follow up appointments thus, they will not refill her pain medication.

## 2014-10-11 NOTE — Progress Notes (Signed)
Today , Dr Julien Nordmann refilled hydrocodone 1 tablet every 6 hours prn pain #40. Pt told me she has appt aug 8 th at [facility] and will not be coming here for her appt on aug 9th.She said to cancel appt. "i don't like the way [facility] long treated me. ". I told her and she acknowledged that Dr Julien Nordmann will not be refilling her pain med anymore to f/u with [facility]. Note to [place] and [employer]

## 2014-10-11 NOTE — Telephone Encounter (Signed)
Patient received her last [place] treatment on 10/06/2014. Patient phoned this morning reporting daily headaches since receiving SRS treatment. Reports she did not have headaches "like this" prior to this treatment. Reports taking decadron 1 mg daily as prescribed. Denies nausea, vomiting, vision changes or ringing in the ears. Reports a steady gait. Reports she is scheduled to be seen at [facility] on 8/8 by a medical oncologist that will assume her care from Dr. Julien Nordmann. Patient reports that only other medications she takes outside of her decadron on a daily basis is "a blood pressure pill and an anti depressant." Offered to arrange an appointment for patient to be seen today by a radiation oncologist. Patient refused. Patient states, "can't somebody just call something in for these headaches." Informed patient this RN will speak with Dr. Lisbeth Renshaw (physician covering Tammi Klippel) about her situation and phone her back later today. Patient verbalized understanding.

## 2014-10-11 NOTE — Telephone Encounter (Signed)
Per Dr. Ida Rogue order called patient instructing her to begin today taking Decadron 2 mg daily instead of 1 mg. Reminded patient of 9/12 follow up at 1030 with Dr. Tammi Klippel. Encouraged patient to call this RN back with future needs. Encouraged patient to reconnect with PCP. Patient verbalized understanding of all reviewed and expressed appreciation for the call.

## 2014-10-13 ENCOUNTER — Other Ambulatory Visit: Payer: Self-pay | Admitting: Medical Oncology

## 2014-10-13 ENCOUNTER — Encounter: Payer: Self-pay | Admitting: *Deleted

## 2014-10-16 ENCOUNTER — Telehealth: Payer: Self-pay | Admitting: *Deleted

## 2014-10-16 NOTE — Telephone Encounter (Signed)
VM message from patient requesting call back. Message received '@1055am'$ .  Patient did not specify what she needed.  Attempted call back. Left message on answering machine to return call to [facility].

## 2014-10-17 ENCOUNTER — Ambulatory Visit: Payer: Medicaid Other | Admitting: Internal Medicine

## 2014-10-17 ENCOUNTER — Encounter: Payer: Self-pay | Admitting: *Deleted

## 2014-10-17 ENCOUNTER — Telehealth: Payer: Self-pay | Admitting: Radiation Oncology

## 2014-10-17 NOTE — Telephone Encounter (Signed)
Patient phoned this RN to report that since she began taking Decadron 2 mg daily (on 10/11/2014) her headaches have resolved. She does reports occasional forgetfulness and dropping items. Explained this RN will inform Dr. Tammi Klippel of these findings but, to continue decadron 2 mg and keep follow up for 11/20/2014. Patient verbalized understanding.

## 2014-10-17 NOTE — Progress Notes (Signed)
Oncology Nurse Navigator Documentation  Oncology Nurse Navigator Flowsheets 10/17/2014  Navigator Encounter Type Other/Tiffany called to speak with me about patients wanting to see another provider. I spoke with Dr. Julien Nordmann and is his ok with her seeing another provider.  Tiffany will arrange appt.   Interventions Referrals;Coordination of Care     Time Spent with Patient 30

## 2014-10-19 ENCOUNTER — Telehealth: Payer: Self-pay | Admitting: *Deleted

## 2014-10-19 NOTE — Telephone Encounter (Signed)
Oncology Nurse Navigator Documentation  Oncology Nurse Navigator Flowsheets 10/19/2014  Navigator Encounter Type Other/Telephone call   Patient Visit Type Follow-up  Treatment Phase Treatment  Barriers/Navigation Needs I called to follow up with patient.  She would like to see another provider and I wanted to see what I could do to address needs.  I left a vm message to call with my name and phone number  Interventions Other  Time Spent with Patient 15

## 2014-10-20 ENCOUNTER — Other Ambulatory Visit: Payer: Self-pay | Admitting: *Deleted

## 2014-10-20 ENCOUNTER — Telehealth: Payer: Self-pay | Admitting: Hematology

## 2014-10-20 ENCOUNTER — Telehealth: Payer: Self-pay | Admitting: *Deleted

## 2014-10-20 ENCOUNTER — Telehealth: Payer: Self-pay

## 2014-10-20 NOTE — Telephone Encounter (Signed)
Oncology Nurse Navigator Documentation  Oncology Nurse Navigator Flowsheets 10/20/2014  Navigator Encounter Type Telephone/I called to follow up with patient and check on her concerns.  I have tried to reach her several times and have been unable.  I called and spoke to her son Merrilee Seashore.  I asked if he could let her know I am here to help and would like to discuss any concerns with her.  He stated he would have her call me.    Patient Visit Type Follow-up  Barriers/Navigation Needs Coordination of care  Interventions Other;Coordination of Care  Time Spent with Patient 30

## 2014-10-20 NOTE — Telephone Encounter (Signed)
S/w patient and gave np appt for 08/17 @ 9:30 w/Dr. [name]

## 2014-10-20 NOTE — Telephone Encounter (Signed)
Pt is switching from Dr Julien Nordmann to Dr [name]. Her appt with Dr [name] is 8/17. She is requesting pain medication refill at least until she sees Dr [name]. Also needs carafate refill. Will Dr [name] do this? She is requesting to pick up rx today.

## 2014-10-23 ENCOUNTER — Other Ambulatory Visit: Payer: Self-pay | Admitting: Nurse Practitioner

## 2014-10-23 ENCOUNTER — Ambulatory Visit (HOSPITAL_BASED_OUTPATIENT_CLINIC_OR_DEPARTMENT_OTHER): Payer: Medicaid Other | Admitting: Hematology

## 2014-10-23 ENCOUNTER — Encounter: Payer: Self-pay | Admitting: Hematology

## 2014-10-23 ENCOUNTER — Other Ambulatory Visit: Payer: Self-pay | Admitting: Hematology

## 2014-10-23 ENCOUNTER — Telehealth: Payer: Self-pay | Admitting: Hematology

## 2014-10-23 ENCOUNTER — Telehealth: Payer: Self-pay | Admitting: Radiation Oncology

## 2014-10-23 VITALS — BP 113/73 | HR 99 | Temp 98.7°F | Resp 16 | Ht 67.0 in | Wt 190.5 lb

## 2014-10-23 DIAGNOSIS — C7931 Secondary malignant neoplasm of brain: Secondary | ICD-10-CM

## 2014-10-23 DIAGNOSIS — K208 Other esophagitis: Secondary | ICD-10-CM

## 2014-10-23 DIAGNOSIS — Z720 Tobacco use: Secondary | ICD-10-CM | POA: Diagnosis not present

## 2014-10-23 DIAGNOSIS — T66XXXA Radiation sickness, unspecified, initial encounter: Secondary | ICD-10-CM

## 2014-10-23 DIAGNOSIS — C3411 Malignant neoplasm of upper lobe, right bronchus or lung: Secondary | ICD-10-CM

## 2014-10-23 MED ORDER — OXYCODONE HCL 5 MG PO TABS
ORAL_TABLET | ORAL | Status: DC
Start: 2014-10-23 — End: 2014-10-26

## 2014-10-23 MED ORDER — SUCRALFATE 1 GM/10ML PO SUSP
1.0000 g | Freq: Three times a day (TID) | ORAL | Status: DC
Start: 2014-10-23 — End: 2015-04-01

## 2014-10-23 MED ORDER — OXYCODONE-ACETAMINOPHEN 5-325 MG PO TABS: 1.0000 | ORAL_TABLET | Freq: Four times a day (QID) | ORAL | Status: DC | PRN

## 2014-10-23 MED ORDER — FIRST-BXN MOUTHWASH MT SUSP: 5.0000 mL | Freq: Four times a day (QID) | OROMUCOSAL | Status: DC

## 2014-10-23 MED ORDER — NICOTINE 21 MG/24HR TD PT24: 21.0000 mg | MEDICATED_PATCH | Freq: Every day | TRANSDERMAL | Status: DC

## 2014-10-23 MED ORDER — SENNOSIDES-DOCUSATE SODIUM 8.6-50 MG PO TABS: 2.0000 | ORAL_TABLET | Freq: Every day | ORAL | Status: DC

## 2014-10-23 MED ORDER — SUCRALFATE 1 GM/10ML PO SUSP: 1.0000 g | ORAL | Status: DC

## 2014-10-23 MED ORDER — NICOTINE 14 MG/24HR TD PT24: 14.0000 mg | MEDICATED_PATCH | Freq: Every day | TRANSDERMAL | Status: DC

## 2014-10-23 NOTE — Telephone Encounter (Signed)
Called patient and gave new d/t for appt  08/15 @ 1:30 w/Dr. [name]

## 2014-10-23 NOTE — Telephone Encounter (Signed)
Patient phoned to report she will be meeting with Dr. [name] today for the first time to establish a med onc since transitioning from Dr. Julien Nordmann. Reminded patient of 11/20/2014 follow up with Dr. Tammi Klippel. Patient without any complaints at this time.

## 2014-10-23 NOTE — Progress Notes (Signed)
    [facility] [phone]   [phone]    Hematology oncology clinic note  Date of service 10/23/2014   [name], [name], FNP [address] [place] [address]  Chief complaint : Transfer of medical oncology care for ongoing treatment of metastatic lung adenocarcinoma  DIAGNOSIS: Stage IV (T3, N2, M1b) non-small cell lung cancer, adenocarcinoma with negative EGFR mutation and negative for ALK gene rearrangement diagnosed in April 2016  PRIOR THERAPY: Patient initially had Stereotactic radiotherapy to the solitary left posterior temporal brain lesion under the care of Dr. Manning completed 07/03/2014. She was subsequently treated with concurrent chemoradiation with weekly carboplatin for AUC of 2 and paclitaxel 45 MG/M2. Status post 8 cycles completed 09/04/2014. Repeat CT chest on 10/06/2014 showed appropriate decrease in size of her right upper lobe lung nodules and mediastinal disease. Unfortunately her MRI of the brain showed a new right temporal parietal brain met which was subsequently treated with SRS by Dr. Manning.   CURRENT THERAPY: Post-treatment re-evaluation to determine further treatments.  INTERVAL HISTORY: Destiny [name] 44 y.o. female is here to transfer her medical oncology cares and for continued treatment of her metastatic lung adenocarcinoma. She is accompanied by her son [name]. She notes that her symptom control for radiation esophagitis has been somewhat suboptimal over the last couple of months and causes her some discomfort.  Overall she is currently not having significant pain at rest from her radiation esophagitis but notes significant discomfort with swallowing in her mid chest area. No fevers or chills. Completed SRS to her second brain metastasis that was noted on MRI late last month. Notes generalized fatigue grade 2, notes some muscle tightness in her lower extremities. No overt leg swelling.  Notes that  she is having some mild memory problems likely getting where she kept things. No headaches. No seizures. No focal neurological deficits.  Wants her try to quit smoking and requests nicotine patch 21 mg for longer than the standard 4 weeks noting that she had quit smoking while she was on this dose of nicotine patch. She continues to take Remeron at nighttime and has been sleeping well.  Breathing was stable. Wondering when she should be reevaluated. We talked about reevaluation 8 to 10 weeks after completion of concurrent chemoradiation. We talked about the palliative nature of the treatment and the fact that aggressive treatment for her oligo metastatic disease might potentially help with improving progression free survival.   MEDICAL HISTORY: Past Medical History  Diagnosis Date  . Thyroid nodule   . Depression   . Suicide attempt   . SVT (supraventricular tachycardia)     a. Long RP tachycardia;  b. 01/2014 s/p RFCA.  . Lung cancer     ALLERGIES:  has No Known Allergies.  MEDICATIONS:  Current Outpatient Prescriptions  Medication Sig Dispense Refill  . dexamethasone (DECADRON) 1 MG tablet Take 2 mg by mouth daily with breakfast. Instructed patient to begin taking Decadron 2 mg daily on 10/11/2014.  0  . lidocaine-prilocaine (EMLA) cream Apply topically 2 (two) times daily as needed (painful skin on chest). 30 g 0  . LORazepam (ATIVAN) 1 MG tablet Take 1 mg by mouth every 8 (eight) hours as needed for anxiety.   0  . mirtazapine (REMERON) 30 MG tablet Take 1 tablet (30 mg total) by mouth at bedtime. 30 tablet 2  . pantoprazole (PROTONIX) 40 MG tablet Take 40 mg by mouth 2 (two) times daily   before a meal.     . prochlorperazine (COMPAZINE) 10 MG tablet Take 1 tablet (10 mg total) by mouth every 6 (six) hours as needed for nausea or vomiting. 30 tablet 0  . Diphenhyd-Lidocaine-Nystatin (FIRST-BXN MOUTHWASH) SUSP Take 5 mLs by mouth 4 (four) times daily. 1 Bottle 2  . [START ON  12/23/2014] nicotine (NICODERM CQ - DOSED IN MG/24 HOURS) 14 mg/24hr patch Place 1 patch (14 mg total) onto the skin daily. 28 patch 0  . nicotine (NICODERM CQ - DOSED IN MG/24 HOURS) 21 mg/24hr patch Place 1 patch (21 mg total) onto the skin daily. 28 patch 1  . oxyCODONE (OXY IR/ROXICODONE) 5 MG immediate release tablet 1-2 tabs PO Q 6 hours prn pain. 30 tablet 0  . oxyCODONE-acetaminophen (PERCOCET/ROXICET) 5-325 MG per tablet Take 1-2 tablets by mouth every 6 (six) hours as needed for moderate pain or severe pain. 60 tablet 0  . sucralfate (CARAFATE) 1 GM/10ML suspension Take 10 mLs (1 g total) by mouth 4 (four) times daily -  with meals and at bedtime. 420 mL 1   No current facility-administered medications for this visit.    SURGICAL HISTORY:  Past Surgical History  Procedure Laterality Date  . Thyroid surgery      Removed thyroid nodule, states still has thyroid; 2010  . Supraventricular tachycardia ablation N/A 01/16/2014    Procedure: SUPRAVENTRICULAR TACHYCARDIA ABLATION;  Surgeon: Gregg W Taylor, MD;  Location: [facility] CATH LAB;  Service: Cardiovascular;  Laterality: N/A;  . Tee without cardioversion N/A 06/12/2014    Procedure: TRANSESOPHAGEAL ECHOCARDIOGRAM (TEE);  Surgeon: Traci R Turner, MD;  Location: [facility] ENDOSCOPY;  Service: Cardiovascular;  Laterality: N/A;    REVIEW OF SYSTEMS:  Constitutional: negative Eyes: negative Ears, nose, mouth, throat, and face: negative Respiratory: negative Cardiovascular: negative Gastrointestinal: positive for dysphagia and odynophagia Genitourinary:negative Integument/breast: negative Hematologic/lymphatic: negative Musculoskeletal:negative Neurological: negative Behavioral/Psych: negative Endocrine: negative Allergic/Immunologic: negative   PHYSICAL EXAMINATION: .BP 113/73 mmHg  Pulse 99  Temp(Src) 98.7 F (37.1 C) (Oral)  Resp 16  Ht 5' 7" (1.702 m)  Wt 190 lb 8 oz (86.41 kg)  BMI 29.83 kg/m2  SpO2 100%  LMP  09/05/2014  GENERAL: Middle-aged African-American female in no acute distress. Mild discomfort from odynophagia. SKIN: skin color, texture, turgor are normal, no rashes or significant lesions EYES: normal, conjunctiva are pink and non-injected, sclera clear OROPHARYNX:no exudate, no erythema and lips, buccal mucosa, and tongue normal  NECK: supple, thyroid normal size, non-tender, without nodularity LYMPH: no palpable lymphadenopathy in the cervical, axillary or inguinal LUNGS: clear to auscultation  HEART: regular rate & rhythm. Musculoskeletal: No pedal edema PSYCH: alert & oriented x 3 with fluent speech NEURO: no focal motor/sensory deficits   ECOG PERFORMANCE STATUS: 1 - Symptomatic but completely ambulatory    LABORATORY DATA:  . CBC Latest Ref Rng 09/10/2014 09/09/2014 09/08/2014  WBC 4.0 - 10.5 K/uL 3.3(L) 4.8 7.9  Hemoglobin 12.0 - 15.0 g/dL 10.1(L) 10.2(L) 12.7  Hematocrit 36.0 - 46.0 % 28.8(L) 29.6(L) 35.9(L)  Platelets 150 - 400 K/uL 176 167 203    . CMP Latest Ref Rng 09/10/2014 09/09/2014 09/08/2014  Glucose 65 - 99 mg/dL 101(H) 88 108(H)  BUN 6 - 20 mg/dL <5(L) <5(L) 11  Creatinine 0.44 - 1.00 mg/dL 0.58 0.65 0.88  Sodium 135 - 145 mmol/L 138 134(L) 132(L)  Potassium 3.5 - 5.1 mmol/L 3.8 3.6 3.6  Chloride 101 - 111 mmol/L 107 101 95(L)  CO2 22 - 32 mmol/L 21(L) 22 24    Calcium 8.9 - 10.3 mg/dL 8.9 8.2(L) 9.4  Total Protein 6.5 - 8.1 g/dL - - 7.9  Total Bilirubin 0.3 - 1.2 mg/dL - - 0.4  Alkaline Phos 38 - 126 U/L - - 83  AST 15 - 41 U/L - - 29  ALT 14 - 54 U/L - - 33    RADIOGRAPHIC STUDIES: Ct Chest W Contrast  10/06/2014   CLINICAL DATA:  44 year old female with history of right upper lobe lung cancer diagnosed in April 2016 status post radiation therapy which is now complete. Ongoing chemotherapy.  EXAM: CT CHEST WITH CONTRAST  TECHNIQUE: Multidetector CT imaging of the chest was performed during intravenous contrast administration.  CONTRAST:  174m OMNIPAQUE  IOHEXOL 300 MG/ML  SOLN  COMPARISON:  PET-CT 06/23/2014.  FINDINGS: Mediastinum/Lymph Nodes: Heart size is normal. There is no significant pericardial fluid, thickening or pericardial calcification. There is atherosclerosis of the thoracic aorta, the great vessels of the mediastinum and the coronary arteries, including calcified atherosclerotic plaque in the left main and left anterior descending coronary arteries. Borderline enlarged subcarinal lymph node measuring 9 mm in short axis. Borderline enlarged low right paratracheal lymph node measuring 8 mm in short axis. Both of these are smaller than prior examinations. No new hilar or other mediastinal lymphadenopathy is noted. Mild thickening of the midesophagus, potentially treatment related. No axillary lymphadenopathy.  Lungs/Pleura: Significant regression of previously noted right upper lobe pulmonary nodules, which currently measure 10 x 7 mm (image 21 of series 5) and 13 x 8 mm (image 24 of series 5), now surrounded by architectural distortion, and a small amount of ground-glass attenuation and septal thickening, compatible with evolving postradiation changes. Linear opacity in the medial segment of the right middle lobe, presumably an area of mild scarring or subsegmental atelectasis. There several ill-defined new areas of nodularity, in the posterior aspect of the right upper lobe (image 26 of series 5), and in the medial aspect of the left upper lobe in a peribronchovascular distribution, best demonstrated on image 25 of series 5). These are highly nonspecific, favored to be inflammatory in etiology, likely related to recent radiation therapy. No other larger more suspicious appearing pulmonary nodules or masses are noted. No acute consolidative airspace disease. No pleural effusions. Mild diffuse bronchial wall thickening with mild centrilobular and paraseptal emphysema.  Upper Abdomen: Unremarkable.  Musculoskeletal/Soft Tissues: There are no aggressive  appearing lytic or blastic lesions noted in the visualized portions of the skeleton.  IMPRESSION: 1. Today's study demonstrates a positive response to therapy with regression of the previously noted right upper lobe pulmonary nodules, and decreased size of previously described mediastinal adenopathy. 2. Evolving postradiation changes in the lungs, as above. 3. Mild thickening of the mid esophagus may be treatment related secondary to recent radiation therapy. 4. Mild diffuse bronchial wall thickening with mild centrilobular and paraseptal emphysema; imaging findings suggestive of underlying COPD. 5. Atherosclerosis, including left main and left anterior descending coronary artery disease. Please note that although the presence of coronary artery calcium documents the presence of coronary artery disease, the severity of this disease and any potential stenosis cannot be assessed on this non-gated CT examination. Assessment for potential risk factor modification, dietary therapy or pharmacologic therapy may be warranted, if clinically indicated.   Electronically Signed   By: DVinnie LangtonM.D.   On: 10/06/2014 11:41   Mr BJeri CosWSVContrast  10/04/2014   CLINICAL DATA:  Metastatic lung cancer. Stereotactic radiosurgery treatment 07/03/2014.  EXAM: MRI HEAD WITHOUT  AND WITH CONTRAST  TECHNIQUE: Multiplanar, multiecho pulse sequences of the brain and surrounding structures were obtained without and with intravenous contrast.  CONTRAST:  18mL MULTIHANCE GADOBENATE DIMEGLUMINE 529 MG/ML IV SOLN  COMPARISON:  MRI 06/20/2014  FINDINGS: Left posterior temporal metastatic deposit is smaller now measuring 8 x 9 mm compared with 12 x 14 mm previously. Decreased in surrounding vasogenic edema since the prior study.  New lesion in the right temporoparietal lobe. This is superficial extends to the cortex. This lesion measures 10 x 11 mm and was not present previously. There is mild surrounding edema which was not present  previously.  No other enhancing lesions identified.  No other areas of edema  Ventricle size normal.  No acute infarct.  Mild mucosal edema in the paranasal sinuses. Orbit intact. Calvarium intact.  IMPRESSION: Interval improvement in treated left posterior temporal metastatic deposit  New metastatic disease right temporoparietal lobe measuring 10 x 11 mm with mild surrounding edema. No other lesions.   Electronically Signed   By: Charles  Clark M.D.   On: 10/04/2014 11:01    ASSESSMENT AND PLAN:   This is a very pleasant 43 years old African-American female recently diagnosed with   1) Stage IV (T3, N2, M1b) non-small cell lung cancer, adenocarcinoma with negative EGFR mutation and negative ALK gene rearrangement diagnosed in April 2016 and presenting with 2 pulmonary nodules in the right upper lobe as well as mediastinal lymphadenopathy and solitary brain lesion. Patient initially had Stereotactic radiotherapy to the solitary left posterior temporal brain lesion under the care of Dr. Manning completed 07/03/2014. She was subsequently treated with concurrent chemoradiation with weekly carboplatin for AUC of 2 and paclitaxel 45 MG/M2. Status post 8 cycles completed 09/04/2014. Repeat CT chest on 10/06/2014 showed appropriate decrease in size of her right upper lobe lung nodules and mediastinal disease. Unfortunately her MRI of the brain showed a new right temporal parietal brain met which was subsequently treated with SRS by Dr. Manning.   #2 grade 2 Radiation esophagitis -resolving not much pain at rest but significant pain with swallowing food.Patient notes Carafate does not completely relieve pain.   #3 Memory problems-patient notes some increased forgetfulness likely related to radiation and chemotherapy. No focal neurological deficits. No seizures. No headaches.   #4 active tobacco abuse.  Plan -Patient counseled to continue good oral intake and adequate fluid intake. -Continue PPI -We'll refill  her sucralfate liquid which she will take 1 hour before meals up to 4 times a day. She was advised not to take any other medications with it due to problems with absorption. -Will also give her a prescription for nystatin and lidocaine-containing mouthwash. -Percocet when necessary for severe uncontrolled pain due to radiation esophagitis. We discussed that this patient should continue improving and that she will be appropriately weaned off pain medications. -Patient counseled on smoking cessation and given a prescription for nicotine patch. -Continue Remeron at nighttime for insomnia and to help with appetite. -Patient understands that she has metastatic disease where all treatments are predominantly palliative and not curative. Her aggressive treatments thus far might potentially improve her progression free survival.  -We will see her back in clinic in 4 weeks with labs and a repeat PET/CT scan for reevaluation. Repeat MRI brain in 8 weeks.  Patient was given the clinic contact information in case of any new questions or concerns. She confirmed understanding of the current disease status, treatment plan and follow plan. All her questions were answered to her apparent   satisfaction.  Gautam Kale MD MS Hematology/Oncology Physician [facility]  (Office):       [phone] (Work cell):  [phone] (Fax):           [phone]   Total time spent 40 minutes more than 50% of time on direct patient counseling, face-to-face communication, smoking cessation guidance, discussions of pain management and follow-up plan.    Disclaimer: This note was dictated with voice recognition software. Similar sounding words can inadvertently be transcribed and may not be corrected upon review.       

## 2014-10-24 ENCOUNTER — Ambulatory Visit: Payer: Medicaid Other | Admitting: Hematology

## 2014-10-24 ENCOUNTER — Telehealth: Payer: Self-pay | Admitting: Hematology

## 2014-10-24 NOTE — Telephone Encounter (Signed)
Pt couldn't hear me on the other end tried calling twice no answer mailed out schedule to pt.[name] KJ

## 2014-10-25 ENCOUNTER — Ambulatory Visit: Payer: Medicaid Other | Admitting: Hematology

## 2014-10-26 ENCOUNTER — Other Ambulatory Visit: Payer: Self-pay | Admitting: Hematology

## 2014-10-26 DIAGNOSIS — K208 Other esophagitis without bleeding: Secondary | ICD-10-CM

## 2014-10-26 DIAGNOSIS — C3411 Malignant neoplasm of upper lobe, right bronchus or lung: Secondary | ICD-10-CM

## 2014-10-26 MED ORDER — OXYCODONE-ACETAMINOPHEN 5-325 MG PO TABS: 1.0000 | ORAL_TABLET | Freq: Four times a day (QID) | ORAL | Status: DC | PRN

## 2014-10-27 ENCOUNTER — Other Ambulatory Visit: Payer: Self-pay | Admitting: Physician Assistant

## 2014-10-27 MED ORDER — OXYCODONE-ACETAMINOPHEN 5-325 MG PO TABS: 1.0000 | ORAL_TABLET | Freq: Four times a day (QID) | ORAL | Status: DC | PRN

## 2014-10-30 NOTE — Progress Notes (Signed)
  Radiation Oncology         [phone]) [phone] ________________________________  Name: DAVID TOWSON MRN: [id number]  Date: 09/01/2014  DOB: June 09, 1970  End of Treatment Note  Diagnosis:   44 yo woman with stage IV lung cancer based on a treated solitary brain met and local stage III disease     Indication for treatment:  Curative, Chemo-Radiotherapy       Radiation treatment dates:   07/17/14-09/01/14  Site/dose:   The primary tumor and involved mediastinal adenopathy were treated to 66 Gy in 33 fractions of 2 Gy.  Beams/energy:   A five field 3D conformal treatment arrangement was used delivering 6 and 10 MV photons.  Daily image-guidance CT was used to align the treatment with the targeted volume  Narrative: The patient tolerated radiation treatment relatively well.  The patient experienced some esophagitis characterized as moderate, requiring carafate.  The patient also noted fatigue.  Plan: The patient has completed radiation treatment. The patient will return to radiation oncology clinic for routine followup in one month. I advised her to call or return sooner if she has any questions or concerns related to her recovery or treatment. ________________________________  Sheral Apley. Tammi Klippel, M.D.

## 2014-11-02 ENCOUNTER — Telehealth: Payer: Self-pay | Admitting: Radiation Oncology

## 2014-11-02 NOTE — Telephone Encounter (Addendum)
Per Dr. Johny Shears order called in decadron 2 mg daily, quantity 30, and no refills to Merrilee Seashore, pharmacist, at [facility]. Phoned patient making her aware this was done. Patient verbalized understanding.

## 2014-11-10 ENCOUNTER — Other Ambulatory Visit: Payer: Self-pay | Admitting: *Deleted

## 2014-11-10 DIAGNOSIS — C3490 Malignant neoplasm of unspecified part of unspecified bronchus or lung: Secondary | ICD-10-CM

## 2014-11-10 MED ORDER — OXYCODONE-ACETAMINOPHEN 5-325 MG PO TABS: 1.0000 | ORAL_TABLET | Freq: Four times a day (QID) | ORAL | Status: DC | PRN

## 2014-11-12 ENCOUNTER — Encounter (HOSPITAL_COMMUNITY): Payer: Self-pay | Admitting: *Deleted

## 2014-11-12 ENCOUNTER — Emergency Department (HOSPITAL_COMMUNITY)
Admission: EM | Admit: 2014-11-12 | Discharge: 2014-11-12 | Disposition: A | Discharge status: Home / Self Care | Payer: Medicaid Other | Attending: Emergency Medicine | Admitting: Emergency Medicine

## 2014-11-12 ENCOUNTER — Emergency Department (HOSPITAL_COMMUNITY): Payer: Medicaid Other

## 2014-11-12 DIAGNOSIS — Z720 Tobacco use: Secondary | ICD-10-CM | POA: Diagnosis not present

## 2014-11-12 DIAGNOSIS — R05 Cough: Secondary | ICD-10-CM | POA: Diagnosis present

## 2014-11-12 DIAGNOSIS — J159 Unspecified bacterial pneumonia: Secondary | ICD-10-CM | POA: Diagnosis not present

## 2014-11-12 DIAGNOSIS — F329 Major depressive disorder, single episode, unspecified: Secondary | ICD-10-CM | POA: Insufficient documentation

## 2014-11-12 DIAGNOSIS — R079 Chest pain, unspecified: Secondary | ICD-10-CM | POA: Diagnosis not present

## 2014-11-12 DIAGNOSIS — Z85118 Personal history of other malignant neoplasm of bronchus and lung: Secondary | ICD-10-CM | POA: Insufficient documentation

## 2014-11-12 DIAGNOSIS — K219 Gastro-esophageal reflux disease without esophagitis: Secondary | ICD-10-CM | POA: Insufficient documentation

## 2014-11-12 DIAGNOSIS — Z79899 Other long term (current) drug therapy: Secondary | ICD-10-CM | POA: Insufficient documentation

## 2014-11-12 DIAGNOSIS — J189 Pneumonia, unspecified organism: Secondary | ICD-10-CM

## 2014-11-12 DIAGNOSIS — Z792 Long term (current) use of antibiotics: Secondary | ICD-10-CM | POA: Diagnosis not present

## 2014-11-12 LAB — BASIC METABOLIC PANEL
ANION GAP: 7 (ref 5–15)
BUN: 10 mg/dL (ref 6–20)
CALCIUM: 10.3 mg/dL (ref 8.9–10.3)
CHLORIDE: 104 mmol/L (ref 101–111)
CO2: 27 mmol/L (ref 22–32)
Creatinine, Ser: 0.78 mg/dL (ref 0.44–1.00)
GFR calc non Af Amer: >60 mL/min (ref >60)
Glucose, Bld: 137 mg/dL — ABNORMAL HIGH (ref 65–99)
Potassium: 3.7 mmol/L (ref 3.5–5.1)
Sodium: 138 mmol/L (ref 135–145)

## 2014-11-12 LAB — CBC WITH DIFFERENTIAL/PLATELET
BASOS ABS: 0 10*3/uL (ref 0.0–0.1)
BASOS PCT: 0 % (ref 0–1)
Eosinophils Absolute: 0 10*3/uL (ref 0.0–0.7)
Eosinophils Relative: 0 % (ref 0–5)
HEMATOCRIT: 38 % (ref 36.0–46.0)
HEMOGLOBIN: 12.6 g/dL (ref 12.0–15.0)
Lymphocytes Relative: 7 % — ABNORMAL LOW (ref 12–46)
Lymphs Abs: 0.6 10*3/uL — ABNORMAL LOW (ref 0.7–4.0)
MCH: 30.1 pg (ref 26.0–34.0)
MCHC: 33.2 g/dL (ref 30.0–36.0)
MCV: 90.9 fL (ref 78.0–100.0)
Monocytes Absolute: 0.3 10*3/uL (ref 0.1–1.0)
Monocytes Relative: 3 % (ref 3–12)
NEUTROS ABS: 8 10*3/uL — AB (ref 1.7–7.7)
NEUTROS PCT: 90 % — AB (ref 43–77)
Platelets: 277 10*3/uL (ref 150–400)
RBC: 4.18 MIL/uL (ref 3.87–5.11)
RDW: 13.6 % (ref 11.5–15.5)
WBC: 8.9 10*3/uL (ref 4.0–10.5)

## 2014-11-12 LAB — TROPONIN I: Troponin I: <0.03 ng/mL (ref <0.031)

## 2014-11-12 MED ORDER — SODIUM CHLORIDE 0.9 % IV BOLUS (SEPSIS)
500.0000 mL | Freq: Once | INTRAVENOUS | Status: AC
Start: 2014-11-12 — End: 2014-11-12
  Administered 2014-11-12: 500 mL via INTRAVENOUS

## 2014-11-12 MED ORDER — SODIUM CHLORIDE 0.9 % IV SOLN
Freq: Once | INTRAVENOUS | Status: AC
  Administered 2014-11-12: 13:00:00 via INTRAVENOUS

## 2014-11-12 MED ORDER — HYDROCODONE-ACETAMINOPHEN 5-325 MG PO TABS: 1.0000 | ORAL_TABLET | ORAL | Status: DC | PRN

## 2014-11-12 MED ORDER — DEXTROSE 5 % IV SOLN
1.0000 g | Freq: Once | INTRAVENOUS | Status: AC
  Administered 2014-11-12: 1 g via INTRAVENOUS
  Filled 2014-11-12: qty 10

## 2014-11-12 MED ORDER — LEVOFLOXACIN 500 MG PO TABS: 500.0000 mg | ORAL_TABLET | Freq: Every day | ORAL | Status: DC

## 2014-11-12 MED ORDER — ONDANSETRON HCL 4 MG/2ML IJ SOLN
4.0000 mg | Freq: Once | INTRAMUSCULAR | Status: AC
  Administered 2014-11-12: 4 mg via INTRAVENOUS
  Filled 2014-11-12: qty 2

## 2014-11-12 MED ORDER — HYDROCODONE-ACETAMINOPHEN 5-325 MG PO TABS
2.0000 | ORAL_TABLET | Freq: Once | ORAL | Status: AC
  Administered 2014-11-12: 2 via ORAL
  Filled 2014-11-12: qty 2

## 2014-11-12 MED ORDER — MORPHINE SULFATE (PF) 4 MG/ML IV SOLN
4.0000 mg | INTRAVENOUS | Status: DC | PRN
  Administered 2014-11-12 (×2): 4 mg via INTRAVENOUS
  Filled 2014-11-12 (×2): qty 1

## 2014-11-12 MED ORDER — IOHEXOL 350 MG/ML SOLN
80.0000 mL | Freq: Once | INTRAVENOUS | Status: AC | PRN
  Administered 2014-11-12: 80 mL via INTRAVENOUS

## 2014-11-12 NOTE — Discharge Instructions (Signed)
Pneumonia, Adult  Pneumonia is an infection of the lungs. It may be caused by a germ (virus or bacteria). Some types of pneumonia can spread easily from person to person. This can happen when you cough or sneeze.  HOME CARE   Only take medicine as told by your doctor.   Take your medicine (antibiotics) as told. Finish it even if you start to feel better.   Do not smoke.   You may use a vaporizer or humidifier in your room. This can help loosen thick spit (mucus).   Sleep so you are almost sitting up (semi-upright). This helps reduce coughing.   Rest.  A shot (vaccine) can help prevent pneumonia. Shots are often advised for:   People over 65 years old.   Patients on chemotherapy.   People with long-term (chronic) lung problems.   People with immune system problems.  GET HELP RIGHT AWAY IF:    You are getting worse.   You cannot control your cough, and you are losing sleep.   You cough up blood.   Your pain gets worse, even with medicine.   You have a fever.   Any of your problems are getting worse, not better.   You have shortness of breath or chest pain.  MAKE SURE YOU:    Understand these instructions.   Will watch your condition.   Will get help right away if you are not doing well or get worse.  Document Released: 08/13/2007 Document Revised: 05/19/2011 Document Reviewed: 05/17/2010  ExitCare Patient Information 2015 [employer]. This information is not intended to replace advice given to you by your health care provider. Make sure you discuss any questions you have with your health care provider.

## 2014-11-12 NOTE — ED Notes (Signed)
Pt states L sided chest pain since Fri.  Pain increases when she coughs.  Stage IV lung CA.

## 2014-11-12 NOTE — ED Provider Notes (Signed)
CSN: [id number]     Arrival date & time 11/12/14  1041 History   First MD Initiated Contact with Patient 11/12/14 1106     Chief Complaint  Patient presents with  . Chest Pain  . Cough      HPI  Patient presents for evaluation of "gas pain in my right chest".  She has a history of lung cancer. Underwent chemotherapy and radiation. She states her most recent treatment was in June. Not scheduled for any additional treatment until end of September. Is still in follow-up with her oncologist. Most recent CT showed good response with decreased size of tumor burden. Is unclear about the exact timing of onset of her symptoms stating "I always carry some gas pain". Also states she frequent as "reflux. Recent CT does show inflammation versus off-again secondary to radiation. However it sounds like for at least 2 days she's had some increasing discomfort in her right chest that is worse with cough. Dry nonproductive infrequent cough. No hemoptysis. No sputum production. No fevers chills nausea vomiting or other complaints. No history of DVT or PE. No leg swelling or symptoms. Does not feel short of breath.   Past Medical History  Diagnosis Date  . Thyroid nodule   . Depression   . Suicide attempt   . SVT (supraventricular tachycardia)     a. Long RP tachycardia;  b. 01/2014 s/p RFCA.  . Lung cancer    Past Surgical History  Procedure Laterality Date  . Thyroid surgery      Removed thyroid nodule, states still has thyroid; 2010  . Supraventricular tachycardia ablation N/A 01/16/2014    Procedure: SUPRAVENTRICULAR TACHYCARDIA ABLATION;  Surgeon: Evans Lance, MD;  Location: [facility] CATH LAB;  Service: Cardiovascular;  Laterality: N/A;  . Tee without cardioversion N/A 06/12/2014    Procedure: TRANSESOPHAGEAL ECHOCARDIOGRAM (TEE);  Surgeon: Sueanne Margarita, MD;  Location: [facility] ENDOSCOPY;  Service: Cardiovascular;  Laterality: N/A;   Family History  Problem Relation Age of Onset  . Seizures Mother   .  Colon cancer Father   . Hypertension Father    Social History  Substance Use Topics  . Smoking status: Current Some Day Smoker -- 1.00 packs/day for 30 years    Types: Cigarettes    Last Attempt to Quit: 06/13/2014  . Smokeless tobacco: None  . Alcohol Use: No     Comment: occ now, used to drink moderately for 20 years    OB History    No data available     Review of Systems  Constitutional: Negative for fever, chills, diaphoresis, appetite change and fatigue.  HENT: Negative for mouth sores, sore throat and trouble swallowing.   Eyes: Negative for visual disturbance.  Respiratory: Negative for cough, chest tightness, shortness of breath and wheezing.        "Gas pain" in her right chest frequently.  Cardiovascular: Positive for chest pain.  Gastrointestinal: Negative for nausea, vomiting, abdominal pain, diarrhea and abdominal distention.       "Reflux" described as burning in her chest often.  Endocrine: Negative for polydipsia, polyphagia and polyuria.  Genitourinary: Negative for dysuria, frequency and hematuria.  Musculoskeletal: Negative for gait problem.  Skin: Negative for color change, pallor and rash.  Neurological: Negative for dizziness, syncope, light-headedness and headaches.  Hematological: Does not bruise/bleed easily.  Psychiatric/Behavioral: Negative for behavioral problems and confusion.      Allergies  Review of patient's allergies indicates no known allergies.  Home Medications   Prior to Admission  medications   Medication Sig Start Date End Date Taking? Authorizing Provider  dexamethasone (DECADRON) 2 MG tablet Take 2 mg by mouth daily. 11/02/14  Yes Historical Provider, MD  Diphenhyd-Lidocaine-Nystatin (FIRST-BXN MOUTHWASH) SUSP Take 5 mLs by mouth 4 (four) times daily. 10/23/14  Yes Brunetta Genera, MD  lidocaine-prilocaine (EMLA) cream Apply topically 2 (two) times daily as needed (painful skin on chest). 09/11/14  Yes Modena Jansky, MD   lisinopril-hydrochlorothiazide (PRINZIDE,ZESTORETIC) 10-12.5 MG per tablet Take 1 tablet by mouth daily. 10/20/14  Yes Historical Provider, MD  LORazepam (ATIVAN) 1 MG tablet Take 1 mg by mouth daily as needed for anxiety.  06/19/14  Yes Historical Provider, MD  mirtazapine (REMERON) 30 MG tablet Take 30 mg by mouth daily. 11/09/14  Yes Historical Provider, MD  nicotine (NICODERM CQ - DOSED IN MG/24 HOURS) 14 mg/24hr patch Place 1 patch (14 mg total) onto the skin daily. 12/23/14  Yes Brunetta Genera, MD  nicotine (NICODERM CQ - DOSED IN MG/24 HOURS) 21 mg/24hr patch Place 1 patch (21 mg total) onto the skin daily. 10/23/14  Yes Brunetta Genera, MD  oxyCODONE-acetaminophen (PERCOCET/ROXICET) 5-325 MG per tablet Take 1-2 tablets by mouth every 6 (six) hours as needed for severe pain. Do not exceed 8 tabs a day 11/10/14  Yes Brunetta Genera, MD  pantoprazole (PROTONIX) 40 MG tablet Take 40 mg by mouth 2 (two) times daily before a meal.  08/28/14 08/28/15 Yes Historical Provider, MD  HYDROcodone-acetaminophen (NORCO/VICODIN) 5-325 MG per tablet Take 1 tablet by mouth every 4 (four) hours as needed. 11/12/14   Tanna Furry, MD  levofloxacin (LEVAQUIN) 500 MG tablet Take 1 tablet (500 mg total) by mouth daily. 11/12/14   Tanna Furry, MD  sucralfate (CARAFATE) 1 GM/10ML suspension Take 10 mLs (1 g total) by mouth 4 (four) times daily -  with meals and at bedtime. Patient not taking: Reported on 11/12/2014 10/23/14   Susanne Borders, NP   BP 120/69 mmHg  Pulse 89  Temp(Src) 98.1 F (36.7 C) (Oral)  Resp 25  Ht '5\' 7"'$  (1.702 m)  Wt 194 lb 7 oz (88.196 kg)  BMI 30.45 kg/m2  SpO2 96%  LMP 06/12/2014 Physical Exam  Constitutional: She is oriented to person, place, and time. She appears well-developed and well-nourished. No distress.  HENT:  Head: Normocephalic.  Eyes: Conjunctivae are normal. Pupils are equal, round, and reactive to light. No scleral icterus.  Neck: Normal range of motion. Neck supple. No  thyromegaly present.  Cardiovascular: Normal rate and regular rhythm.  Exam reveals no gallop and no friction rub.   No murmur heard. Pulmonary/Chest: Effort normal and breath sounds normal. No respiratory distress. She has no wheezes. She has no rales.  Clear bilateral breath sounds. No wheezing rales rhonchi. No focal diminished breath sounds.  Abdominal: Soft. Bowel sounds are normal. She exhibits no distension. There is no tenderness. There is no rebound.  Musculoskeletal: Normal range of motion.  Neurological: She is alert and oriented to person, place, and time.  Skin: Skin is warm and dry. No rash noted.  Psychiatric: She has a normal mood and affect. Her behavior is normal.    ED Course  Procedures (including critical care time) Labs Review Labs Reviewed  CBC WITH DIFFERENTIAL/PLATELET - Abnormal; Notable for the following:    Neutrophils Relative % 90 (*)    Neutro Abs 8.0 (*)    Lymphocytes Relative 7 (*)    Lymphs Abs 0.6 (*)  All other components within normal limits  BASIC METABOLIC PANEL - Abnormal; Notable for the following:    Glucose, Bld 137 (*)    All other components within normal limits  TROPONIN I    Imaging Review Ct Angio Chest Pe W/cm &/or Wo Cm  11/12/2014   CLINICAL DATA:  Right chest pain. Tachycardia. Shortness of breath. History of lung cancer. Completed radiation therapy chemotherapy.  EXAM: CT ANGIOGRAPHY CHEST WITH CONTRAST  TECHNIQUE: Multidetector CT imaging of the chest was performed using the standard protocol during bolus administration of intravenous contrast. Multiplanar CT image reconstructions and MIPs were obtained to evaluate the vascular anatomy.  CONTRAST:  57m OMNIPAQUE IOHEXOL 350 MG/ML SOLN  COMPARISON:  10/06/2014.  FINDINGS: Normally opacified pulmonary arteries with no pulmonary arterial filling defects seen. Small area of focal bullous change and associated lung nodule in the right upper lobe. The nodule measures 9 x 7 mm on image  number 30, previously measuring 10 x 7 mm.  A previously demonstrated 13 x 8 mm less well-defined nodular component more inferiorly appears more linear and ill-defined today, measuring approximately 10 x 5 mm on image number 33.  Interval small, focal area of ill-defined increased density and cavitation in the posterior medial aspect of the right upper lobe on images 21 through 23. The somewhat nodular, patchy component measures 12 x 8 mm on image number 23.  There is also new, focal, patchy density in the lateral aspect of the right upper lobe, measuring 19 x 9 mm on image number 27.  Linear scarring in the right middle lobe is less prominent. The left lung remains clear. Diffuse peribronchial thickening is unchanged.  No pleural fluid. The previously demonstrated 8 mm short axis precarinal node on the right measures 7 mm in short axis diameter on image number 34 and is visually less prominent. A previously demonstrated 9 mm short axis subcarinal node measures 6 mm in short axis diameter on image number 38. Interval mildly enlarged right hilar lymph nodes. The largest has a short axis diameter of 10 mm on image number 45. A prevascular lymph node anterior to the aortic arch currently has a short axis diameter of 10 mm on image number 25 and previously had a short axis diameter 10 mm.  Previously described atheromatous coronary artery calcifications are again noted. Concentric mid esophageal wall thickening is unchanged, measuring 8 mm in thickness on image number 37. Mild thoracic spine degenerative changes. Unremarkable upper abdomen.  Review of the MIP images confirms the above findings.  IMPRESSION: 1. Interval small areas of patchy, peripheral density in the right upper lobe, as described above. These have an appearance suspicious for small foci of infection. 2. Further regression of previously described small right upper lobe nodules. 3. No significant change in mid esophageal wall thickening, most likely due  to postradiation changes. A mass is less likely. 4. Stable changes of COPD and chronic bronchitis. 5. Previously noted coronary artery atheromatous calcifications. 6. No pulmonary emboli. 7. Interval mild right hilar adenopathy, most likely reactive. 8. No significant change in mild mediastinal adenopathy.   Electronically Signed   By: SClaudie ReveringM.D.   On: 11/12/2014 13:38   I have personally reviewed and evaluated these images and lab results as part of my medical decision-making.   EKG Interpretation   Date/Time:  Sunday November 12 2014 10:52:32 EDT Ventricular Rate:  119 PR Interval:    QRS Duration: 96 QT Interval:  460 QTC Calculation: 647 R Axis:  94 Text Interpretation:  sinus rhythm Prolonged QT Abnormal ECG Confirmed by  Jeneen Rinks  MD, [place] ([id number]) on 11/12/2014 11:08:16 AM Also confirmed by Jeneen Rinks   MD, [place] ([id number])  on 11/12/2014 12:39:03 PM      MDM   Final diagnoses:  CAP (community acquired pneumonia)    All the findings on her CT scan including the new infiltrate. Asked to take her CT scan with her tonight follow-up point with her physician. Given IV Rocephin. Discharge Levaquin and Vicodin for her community acquired pneumonia.  She is well oxygenated. She has been able toward with minimal symptoms.    Tanna Furry, MD 11/12/14 1357

## 2014-11-15 ENCOUNTER — Other Ambulatory Visit: Payer: Self-pay | Admitting: *Deleted

## 2014-11-15 DIAGNOSIS — C3490 Malignant neoplasm of unspecified part of unspecified bronchus or lung: Secondary | ICD-10-CM

## 2014-11-15 MED ORDER — OXYCODONE-ACETAMINOPHEN 5-325 MG PO TABS: 1.0000 | ORAL_TABLET | Freq: Four times a day (QID) | ORAL | Status: DC | PRN

## 2014-11-20 ENCOUNTER — Inpatient Hospital Stay
Admission: RE | Admit: 2014-11-20 | Discharge: 2014-11-20 | Disposition: A | Discharge status: Home / Self Care | Payer: Medicaid Other | Source: Ambulatory Visit | Attending: Radiation Oncology | Admitting: Radiation Oncology

## 2014-11-20 ENCOUNTER — Ambulatory Visit (HOSPITAL_BASED_OUTPATIENT_CLINIC_OR_DEPARTMENT_OTHER): Payer: Medicaid Other | Admitting: Hematology

## 2014-11-20 ENCOUNTER — Ambulatory Visit (HOSPITAL_COMMUNITY): Payer: Medicaid Other

## 2014-11-20 ENCOUNTER — Other Ambulatory Visit (HOSPITAL_BASED_OUTPATIENT_CLINIC_OR_DEPARTMENT_OTHER): Payer: Medicaid Other

## 2014-11-20 ENCOUNTER — Encounter: Payer: Self-pay | Admitting: Hematology

## 2014-11-20 ENCOUNTER — Encounter: Payer: Self-pay | Admitting: Medical Oncology

## 2014-11-20 ENCOUNTER — Telehealth: Payer: Self-pay | Admitting: Hematology

## 2014-11-20 VITALS — BP 120/68 | HR 89 | Temp 98.4°F | Resp 18 | Ht 67.0 in | Wt 192.5 lb

## 2014-11-20 DIAGNOSIS — C3411 Malignant neoplasm of upper lobe, right bronchus or lung: Secondary | ICD-10-CM

## 2014-11-20 DIAGNOSIS — G893 Neoplasm related pain (acute) (chronic): Secondary | ICD-10-CM

## 2014-11-20 DIAGNOSIS — R1310 Dysphagia, unspecified: Secondary | ICD-10-CM

## 2014-11-20 DIAGNOSIS — F419 Anxiety disorder, unspecified: Secondary | ICD-10-CM

## 2014-11-20 LAB — CBC & DIFF AND RETIC
BASO%: 0.1 % (ref 0.0–2.0)
Basophils Absolute: 0 10*3/uL (ref 0.0–0.1)
EOS%: 0.1 % (ref 0.0–7.0)
Eosinophils Absolute: 0 10*3/uL (ref 0.0–0.5)
HCT: 36.3 % (ref 34.8–46.6)
HGB: 12.4 g/dL (ref 11.6–15.9)
IMMATURE RETIC FRACT: 3.8 % (ref 1.60–10.00)
LYMPH#: 1.4 10*3/uL (ref 0.9–3.3)
LYMPH%: 13.9 % — AB (ref 14.0–49.7)
MCH: 30.8 pg (ref 25.1–34.0)
MCHC: 34.2 g/dL (ref 31.5–36.0)
MCV: 90.3 fL (ref 79.5–101.0)
MONO#: 0.6 10*3/uL (ref 0.1–0.9)
MONO%: 6.1 % (ref 0.0–14.0)
NEUT%: 79.8 % — ABNORMAL HIGH (ref 38.4–76.8)
NEUTROS ABS: 7.8 10*3/uL — AB (ref 1.5–6.5)
PLATELETS: 266 10*3/uL (ref 145–400)
RBC: 4.02 10*6/uL (ref 3.70–5.45)
RDW: 13.6 % (ref 11.2–14.5)
Retic %: 1.02 % (ref 0.70–2.10)
Retic Ct Abs: 41 10*3/uL (ref 33.70–90.70)
WBC: 9.8 10*3/uL (ref 3.9–10.3)

## 2014-11-20 LAB — COMPREHENSIVE METABOLIC PANEL (CC13)
ALT: 20 U/L (ref 0–55)
ANION GAP: 9 meq/L (ref 3–11)
AST: 16 U/L (ref 5–34)
Albumin: 3.7 g/dL (ref 3.5–5.0)
Alkaline Phosphatase: 78 U/L (ref 40–150)
BUN: 14.4 mg/dL (ref 7.0–26.0)
CHLORIDE: 104 meq/L (ref 98–109)
CO2: 26 meq/L (ref 22–29)
Calcium: 10.2 mg/dL (ref 8.4–10.4)
Creatinine: 0.8 mg/dL (ref 0.6–1.1)
GLUCOSE: 118 mg/dL (ref 70–140)
POTASSIUM: 3.8 meq/L (ref 3.5–5.1)
SODIUM: 139 meq/L (ref 136–145)
TOTAL PROTEIN: 7.6 g/dL (ref 6.4–8.3)
Total Bilirubin: 0.4 mg/dL (ref 0.20–1.20)

## 2014-11-20 MED ORDER — CLONAZEPAM 0.5 MG PO TABS: 0.5000 mg | ORAL_TABLET | Freq: Two times a day (BID) | ORAL | Status: DC | PRN

## 2014-11-20 MED ORDER — HYDROXYZINE PAMOATE 25 MG PO CAPS: 25.0000 mg | ORAL_CAPSULE | Freq: Three times a day (TID) | ORAL | Status: DC | PRN

## 2014-11-20 NOTE — Telephone Encounter (Signed)
Gave and printed appt sched adna vs for pt for OCT

## 2014-11-21 ENCOUNTER — Other Ambulatory Visit: Payer: Self-pay | Admitting: Oncology

## 2014-11-22 ENCOUNTER — Other Ambulatory Visit: Payer: Self-pay | Admitting: *Deleted

## 2014-11-22 MED ORDER — CLONAZEPAM 0.5 MG PO TABS: 0.5000 mg | ORAL_TABLET | Freq: Two times a day (BID) | ORAL | Status: DC | PRN

## 2014-11-22 MED ORDER — HYDROXYZINE PAMOATE 25 MG PO CAPS: 25.0000 mg | ORAL_CAPSULE | Freq: Three times a day (TID) | ORAL | Status: DC | PRN

## 2014-11-22 NOTE — Telephone Encounter (Signed)
Prescription for Vistaril/Klonopin phone in with consent by Awilda Metro PA due to pending authorization for [facility] with Medicaid

## 2014-11-23 ENCOUNTER — Encounter: Payer: Self-pay | Admitting: Radiation Oncology

## 2014-11-23 ENCOUNTER — Ambulatory Visit
Admission: RE | Admit: 2014-11-23 | Discharge: 2014-11-23 | Disposition: A | Discharge status: Home / Self Care | Payer: Medicaid Other | Source: Ambulatory Visit | Attending: Radiation Oncology | Admitting: Radiation Oncology

## 2014-11-23 ENCOUNTER — Telehealth: Payer: Self-pay | Admitting: Hematology

## 2014-11-23 VITALS — BP 120/85 | HR 97 | Resp 16 | Wt 192.0 lb

## 2014-11-23 DIAGNOSIS — C3411 Malignant neoplasm of upper lobe, right bronchus or lung: Secondary | ICD-10-CM

## 2014-11-23 NOTE — Progress Notes (Signed)
Weight and vitals stable. Reports significant pain with swallowing. Reports Dr. [name] is setting her up to be scope to evaluate source of pain. Denies headaches. Upset about a small patch of hair loss. Denies nausea or vomiting. Reports occasional dizziness. Denies diplopia or ringing in the ears. Reports shortness of breath with exertion. Reports her right upper back remains tender. Hyperpigmentation of right upper back noted. Encouraged patient to apply OTC lotion to right upper back where hyperpigmentation is located. Patient continues to take Decadron 2 mg daily.  BP 120/85 mmHg  Pulse 97  Resp 16  Wt 192 lb (87.091 kg)  SpO2 98%  LMP 06/12/2014 Wt Readings from Last 3 Encounters:  11/23/14 192 lb (87.091 kg)  11/20/14 192 lb 8 oz (87.317 kg)  11/12/14 194 lb 7 oz (88.196 kg)

## 2014-11-23 NOTE — Telephone Encounter (Signed)
pt cld & left voicemail wanted to r/s appt-cld pt back no answer-adv to call back

## 2014-11-23 NOTE — Progress Notes (Signed)
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 Primary cancer of right upper lobe of lung 162.3 C34.11     Interval Since Last Radiation:  3 months [07/17/2014-09/01/2014]  Narrative:  The patient returns today for routine follow-up with radiation oncology. The patient's weight and vitals are currently stable. She reports significant pain upon swallowing. She denies symptoms of headaches, nausea or vomiting . She is upset about a small patch of hair loss, which occurred during her most recent radiation therapy treatment. She denies symptoms of diplopia or ringing in the ears. She additionally reports symptoms of shortness of breath with exertion, hyperpigmentation of right upper back, and occasional dizziness. The patient will continues administration of Decadron 2 mg daily. "The water still hurts. I be hollering and eating at the same time," the patient stated while pointing to the middle of her lower chest. She reports that she has headaches when she is off of the steroids and for this reason wishes to continue administration of steroids. A CT scan was completed on 11/12/2014. Her most recent medical visit she stated that she "had a touch of the pneumonia." She has requested a copy of her scans and was informed of how to obtain these. The patient presented with a heavy dialect. She is currently trying to find a new PCP, especially to address her back pain and muscle spasms. The patient projected a healthy mental status and was accompanied by her family for today's radiation oncology appointment.                             ALLERGIES:  has No Known Allergies.  Meds: Current Outpatient Prescriptions    Medication Sig Dispense Refill  . clonazePAM (KLONOPIN) 0.5 MG tablet Take 1 tablet (0.5 mg total) by mouth 2 (two) times daily as needed for anxiety. 60 tablet 0  . dexamethasone (DECADRON) 2 MG tablet Take 2 mg by mouth daily.    . Diphenhyd-Lidocaine-Nystatin (FIRST-BXN MOUTHWASH) SUSP Take 5 mLs by mouth 4 (four) times daily. 1 Bottle 2  . hydrOXYzine (VISTARIL) 25 MG capsule Take 1 capsule (25 mg total) by mouth 3 (three) times daily as needed for itching or anxiety. 60 capsule 0  . lidocaine-prilocaine (EMLA) cream Apply topically 2 (two) times daily as needed (painful skin on chest). 30 g 0  . lisinopril-hydrochlorothiazide (PRINZIDE,ZESTORETIC) 10-12.5 MG per tablet Take 1 tablet by mouth daily.  0  . mirtazapine (REMERON) 30 MG tablet Take 30 mg by mouth daily.  0  . [START ON 12/23/2014] nicotine (NICODERM CQ - DOSED IN MG/24 HOURS) 14 mg/24hr patch Place 1 patch (14 mg total) onto the skin daily. 28 patch 0  . nicotine (NICODERM CQ - DOSED IN MG/24 HOURS) 21 mg/24hr patch Place 1 patch (21 mg total) onto the skin daily. 28 patch 1  . oxyCODONE-acetaminophen (PERCOCET/ROXICET) 5-325 MG per tablet Take 1-2 tablets by mouth every 6 (six) hours as needed for severe pain. Do not exceed 8 tabs a day 120 tablet 0  . pantoprazole (PROTONIX) 40 MG tablet Take 40 mg by mouth 2 (two) times daily before a meal.     .  sucralfate (CARAFATE) 1 GM/10ML suspension Take 10 mLs (1 g total) by mouth 4 (four) times daily -  with meals and at bedtime. 420 mL 1   No current facility-administered medications for this encounter.    Physical Findings: The patient is in no acute distress and the patient is alert and oriented x3.  weight is 192 lb (87.091 kg). Her blood pressure is 120/85 and her pulse is 97. Her respiration is 16 and oxygen saturation is 98%. . The patient has no significant changes to the status of her overall health to be noted at this time.  Lab Findings: Lab Results  Component Value  Date   WBC 9.8 11/20/2014   WBC 8.9 11/12/2014   HGB 12.4 11/20/2014   HGB 12.6 11/12/2014   HCT 36.3 11/20/2014   HCT 38.0 11/12/2014   PLT 266 11/20/2014   PLT 277 11/12/2014    Lab Results  Component Value Date   NA 139 11/20/2014   NA 138 11/12/2014   K 3.8 11/20/2014   K 3.7 11/12/2014   CHLORIDE 104 11/20/2014   CO2 26 11/20/2014   CO2 27 11/12/2014   GLUCOSE 118 11/20/2014   GLUCOSE 137* 11/12/2014   BUN 14.4 11/20/2014   BUN 10 11/12/2014   CREATININE 0.8 11/20/2014   CREATININE 0.78 11/12/2014   BILITOT 0.40 11/20/2014   BILITOT 0.4 09/08/2014   ALKPHOS 78 11/20/2014   ALKPHOS 83 09/08/2014   AST 16 11/20/2014   AST 29 09/08/2014   ALT 20 11/20/2014   ALT 33 09/08/2014   PROT 7.6 11/20/2014   PROT 7.9 09/08/2014   ALBUMIN 3.7 11/20/2014   ALBUMIN 3.8 09/08/2014   CALCIUM 10.2 11/20/2014   CALCIUM 10.3 11/12/2014   ANIONGAP 9 11/20/2014   ANIONGAP 7 11/12/2014    Radiographic Findings: Ct Angio Chest Pe W/cm &/or Wo Cm  11/12/2014   CLINICAL DATA:  Right chest pain. Tachycardia. Shortness of breath. History of lung cancer. Completed radiation therapy chemotherapy.  EXAM: CT ANGIOGRAPHY CHEST WITH CONTRAST  TECHNIQUE: Multidetector CT imaging of the chest was performed using the standard protocol during bolus administration of intravenous contrast. Multiplanar CT image reconstructions and MIPs were obtained to evaluate the vascular anatomy.  CONTRAST:  78m OMNIPAQUE IOHEXOL 350 MG/ML SOLN  COMPARISON:  10/06/2014.  FINDINGS: Normally opacified pulmonary arteries with no pulmonary arterial filling defects seen. Small area of focal bullous change and associated lung nodule in the right upper lobe. The nodule measures 9 x 7 mm on image number 30, previously measuring 10 x 7 mm.  A previously demonstrated 13 x 8 mm less well-defined nodular component more inferiorly appears more linear and ill-defined today, measuring approximately 10 x 5 mm on image number 33.   Interval small, focal area of ill-defined increased density and cavitation in the posterior medial aspect of the right upper lobe on images 21 through 23. The somewhat nodular, patchy component measures 12 x 8 mm on image number 23.  There is also new, focal, patchy density in the lateral aspect of the right upper lobe, measuring 19 x 9 mm on image number 27.  Linear scarring in the right middle lobe is less prominent. The left lung remains clear. Diffuse peribronchial thickening is unchanged.  No pleural fluid. The previously demonstrated 8 mm short axis precarinal node on the right measures 7 mm in short axis diameter on image number 34 and is visually less prominent. A previously demonstrated 9 mm short axis subcarinal node measures 6  mm in short axis diameter on image number 38. Interval mildly enlarged right hilar lymph nodes. The largest has a short axis diameter of 10 mm on image number 45. A prevascular lymph node anterior to the aortic arch currently has a short axis diameter of 10 mm on image number 25 and previously had a short axis diameter 10 mm.  Previously described atheromatous coronary artery calcifications are again noted. Concentric mid esophageal wall thickening is unchanged, measuring 8 mm in thickness on image number 37. Mild thoracic spine degenerative changes. Unremarkable upper abdomen.  Review of the MIP images confirms the above findings.  IMPRESSION: 1. Interval small areas of patchy, peripheral density in the right upper lobe, as described above. These have an appearance suspicious for small foci of infection. 2. Further regression of previously described small right upper lobe nodules. 3. No significant change in mid esophageal wall thickening, most likely due to postradiation changes. A mass is less likely. 4. Stable changes of COPD and chronic bronchitis. 5. Previously noted coronary artery atheromatous calcifications. 6. No pulmonary emboli. 7. Interval mild right hilar adenopathy,  most likely reactive. 8. No significant change in mild mediastinal adenopathy.   Electronically Signed   By: Claudie Revering M.D.   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 The patient is recovering from the effects of radiation. The patient is aware of her PET scan to take place via Dr. [name]. Brain MRI to take place around 01/01/2015 with radiation oncology appointment to follow. The patient understands the results of her most recent CT scan. She understands that Dr. [name] is currently considering a referral for endoscopy. The patient understands the difference between a CT scan, PET scan, and MRI brain scan and benefits of completing these. The patient is aware of the purpose and how to use MyChart to obtain her appointment information and records.  Plan: The patient's most recent CT scan was reviewed in detail. She has been encouraged to apply OTC lotion to right upper back where hyperpigmentation was noted at today's appointment upon physical exam. She has been encouraged to visit MyChart and radiology in order to receive a personal copy of her scans, as requested. The patient has been advised of her PET scan to take place via Dr. [name] and a brain MRI to take place on 01/01/2015 with radiation oncology appointment to follow. She has been encouraged to continue the administration of her medication, as previously advised in management of her symptoms. All vocalized questions and concerns have been addressed. If the patient develops any further questions or concerns in regards to her treatment and recovery, she has been encouraged to contact Dr. Tammi Klippel, MD.  This document serves as a record of services personally performed by Tyler Pita, MD. It was created on his behalf by Lenn Cal, a trained medical scribe. The creation of this record  is based on the scribe's personal observations and the provider's statements to them. This document has been checked and approved by the attending provider. _____________________________________  Sheral Apley. Tammi Klippel, M.D.

## 2014-11-26 NOTE — Progress Notes (Signed)
Destiny [name] [phone]   Fax:([phone]) [phone]    Hematology oncology clinic note  Date of service 11/26/2014   Destiny [name], [place] [address] [place] [address]  Chief complaint : Transfer of medical oncology care for ongoing treatment of metastatic lung adenocarcinoma  DIAGNOSIS: Stage IV (T3, N2, M1b) non-small cell lung cancer, adenocarcinoma with negative EGFR mutation and negative for ALK gene rearrangement diagnosed in April 2016  PRIOR THERAPY: Patient initially had Stereotactic radiotherapy to the solitary left posterior temporal brain lesion under the care of Dr. Tammi Klippel completed 07/03/2014. She was subsequently treated with concurrent chemoradiation with weekly carboplatin for AUC of 2 and paclitaxel 45 MG/M2. Status post 8 cycles completed 09/04/2014. Repeat CT chest on 10/06/2014 showed appropriate decrease in size of her right upper lobe lung nodules and mediastinal disease. Unfortunately her MRI of the brain showed a new right temporal parietal brain met which was subsequently treated with SRS by Dr. Tammi Klippel.   CURRENT THERAPY: Post-treatment re-evaluation to determine further treatments.  INTERVAL HISTORY: Destiny [name] 44 y.o. female is is here with her son and other family for follow-up regarding her metastatic lung cancer. She notes no new focal neurological deficits no headaches. Still notes that she is having significant mid to lower esophageal pain with swallowing of food.  She still notes significant anxiety despite taking her Remeron and as needed Ativan. Anxiety is persistent. She is open to the idea of switching the lorazepam to clonazepam. Also given when necessary hydroxyzine for anxiety. Somewhat hesitant to get a psychiatry referral. She was encouraged to talk to her radiation oncology doctors about possibly tapering her off her dexamethasone as this might help with her anxiety some.  No change in  breathing.  Still taking her PPI. Still smoking despite getting a nicotine patch last visit. No other concerning focal symptoms.    MEDICAL HISTORY: Past Medical History  Diagnosis Date  . Thyroid nodule   . Depression   . Suicide attempt   . SVT (supraventricular tachycardia)     a. Long RP tachycardia;  b. 01/2014 s/p RFCA.  . Lung cancer     ALLERGIES:  has No Known Allergies.  MEDICATIONS:  Current Outpatient Prescriptions  Medication Sig Dispense Refill  . dexamethasone (DECADRON) 2 MG tablet Take 2 mg by mouth daily.    . Diphenhyd-Lidocaine-Nystatin (FIRST-BXN MOUTHWASH) SUSP Take 5 mLs by mouth 4 (four) times daily. 1 Bottle 2  . lidocaine-prilocaine (EMLA) cream Apply topically 2 (two) times daily as needed (painful skin on chest). 30 g 0  . lisinopril-hydrochlorothiazide (PRINZIDE,ZESTORETIC) 10-12.5 MG per tablet Take 1 tablet by mouth daily.  0  . mirtazapine (REMERON) 30 MG tablet Take 30 mg by mouth daily.  0  . [START ON 12/23/2014] nicotine (NICODERM CQ - DOSED IN MG/24 HOURS) 14 mg/24hr patch Place 1 patch (14 mg total) onto the skin daily. 28 patch 0  . nicotine (NICODERM CQ - DOSED IN MG/24 HOURS) 21 mg/24hr patch Place 1 patch (21 mg total) onto the skin daily. 28 patch 1  . oxyCODONE-acetaminophen (PERCOCET/ROXICET) 5-325 MG per tablet Take 1-2 tablets by mouth every 6 ([name]) hours as needed for severe pain. Do not exceed 8 tabs a day 120 tablet 0  . pantoprazole (PROTONIX) 40 MG tablet Take 40 mg by mouth 2 (two) times daily before a meal.     . sucralfate (CARAFATE) 1 GM/10ML suspension Take 10 mLs (1 g  total) by mouth 4 (four) times daily -  with meals and at bedtime. 420 mL 1  . clonazePAM (KLONOPIN) 0.5 MG tablet Take 1 tablet (0.5 mg total) by mouth 2 (two) times daily as needed for anxiety. 60 tablet 0  . hydrOXYzine (VISTARIL) 25 MG capsule Take 1 capsule (25 mg total) by mouth 3 (three) times daily as needed for itching or anxiety. 60 capsule 0   No  current facility-administered medications for this visit.    SURGICAL HISTORY:  Past Surgical History  Procedure Laterality Date  . Thyroid surgery      Removed thyroid nodule, states still has thyroid; 2010  . Supraventricular tachycardia ablation N/A 01/16/2014    Procedure: SUPRAVENTRICULAR TACHYCARDIA ABLATION;  Surgeon: Evans Lance, MD;  Location: [facility] CATH LAB;  Service: Cardiovascular;  Laterality: N/A;  . Tee without cardioversion N/A 06/12/2014    Procedure: TRANSESOPHAGEAL ECHOCARDIOGRAM (TEE);  Surgeon: Sueanne Margarita, MD;  Location: [facility] ENDOSCOPY;  Service: Cardiovascular;  Laterality: N/A;    REVIEW OF SYSTEMS:  Constitutional: negative Eyes: negative Ears, nose, mouth, throat, and face: negative Respiratory: negative Cardiovascular: negative Gastrointestinal: positive for dysphagia and odynophagia Genitourinary:negative Integument/breast: negative Hematologic/lymphatic: negative Musculoskeletal:negative Neurological: negative Behavioral/Psych: negative Endocrine: negative Allergic/Immunologic: negative   PHYSICAL EXAMINATION: .BP 120/68 mmHg  Pulse 89  Temp(Src) 98.4 F (36.9 C) (Oral)  Resp 18  Ht 5' 7"  (1.702 m)  Wt 192 lb 8 oz (87.317 kg)  BMI 30.14 kg/m2  SpO2 100%  LMP 06/12/2014  GENERAL: Middle-aged African-American female in no acute distress. Anxious.  SKIN: skin color, texture, turgor are normal, no rashes or significant lesions EYES: normal, conjunctiva are pink and non-injected, sclera clear OROPHARYNX:no exudate, no erythema and lips, buccal mucosa, and tongue normal  NECK: supple, thyroid normal size, non-tender, without nodularity LYMPH: no palpable lymphadenopathy in the cervical, axillary or inguinal LUNGS: clear to auscultation  HEART: regular rate & rhythm. Musculoskeletal: No pedal edema PSYCH: alert & oriented x 3 with fluent speech NEURO: no focal motor/sensory deficits   ECOG PERFORMANCE STATUS: 1 - Symptomatic but completely  ambulatory    LABORATORY DATA:  . CBC Latest Ref Rng 11/20/2014 11/12/2014 09/10/2014  WBC 3.9 - 10.3 10e3/uL 9.8 8.9 3.3(L)  Hemoglobin 11.6 - 15.9 g/dL 12.4 12.6 10.1(L)  Hematocrit 34.8 - 46.6 % 36.3 38.0 28.8(L)  Platelets 145 - 400 10e3/uL 266 277 176    . CMP Latest Ref Rng 11/20/2014 11/12/2014 09/10/2014  Glucose 70 - 140 mg/dl 118 137(H) 101(H)  BUN 7.0 - 26.0 mg/dL 14.4 10 <5(L)  Creatinine 0.6 - 1.1 mg/dL 0.8 0.78 0.58  Sodium 136 - 145 mEq/L 139 138 138  Potassium 3.5 - 5.1 mEq/L 3.8 3.7 3.8  Chloride 101 - 111 mmol/L - 104 107  CO2 22 - 29 mEq/L 26 27 21(L)  Calcium 8.4 - 10.4 mg/dL 10.2 10.3 8.9  Total Protein 6.4 - 8.3 g/dL 7.6 - -  Total Bilirubin 0.20 - 1.20 mg/dL 0.40 - -  Alkaline Phos 40 - 150 U/L 78 - -  AST 5 - 34 U/L 16 - -  ALT 0 - 55 U/L 20 - -    RADIOGRAPHIC STUDIES: Ct Angio Chest Pe W/cm &/or Wo Cm  11/12/2014   CLINICAL DATA:  Right chest pain. Tachycardia. Shortness of breath. History of lung cancer. Completed radiation therapy chemotherapy.  EXAM: CT ANGIOGRAPHY CHEST WITH CONTRAST  TECHNIQUE: Multidetector CT imaging of the chest was performed using the standard protocol during bolus administration  of intravenous contrast. Multiplanar CT image reconstructions and MIPs were obtained to evaluate the vascular anatomy.  CONTRAST:  25m OMNIPAQUE IOHEXOL 350 MG/ML SOLN  COMPARISON:  10/06/2014.  FINDINGS: Normally opacified pulmonary arteries with no pulmonary arterial filling defects seen. Small area of focal bullous change and associated lung nodule in the right upper lobe. The nodule measures 9 x 7 mm on image number 30, previously measuring 10 x 7 mm.  A previously demonstrated 13 x 8 mm less well-defined nodular component more inferiorly appears more linear and ill-defined today, measuring approximately 10 x 5 mm on image number 33.  Interval small, focal area of ill-defined increased density and cavitation in the posterior medial aspect of the right upper lobe  on images 21 through 23. The somewhat nodular, patchy component measures 12 x 8 mm on image number 23.  There is also new, focal, patchy density in the lateral aspect of the right upper lobe, measuring 19 x 9 mm on image number 27.  Linear scarring in the right middle lobe is less prominent. The left lung remains clear. Diffuse peribronchial thickening is unchanged.  No pleural fluid. The previously demonstrated 8 mm short axis precarinal node on the right measures 7 mm in short axis diameter on image number 34 and is visually less prominent. A previously demonstrated 9 mm short axis subcarinal node measures 6 mm in short axis diameter on image number 38. Interval mildly enlarged right hilar lymph nodes. The largest has a short axis diameter of 10 mm on image number 45. A prevascular lymph node anterior to the aortic arch currently has a short axis diameter of 10 mm on image number 25 and previously had a short axis diameter 10 mm.  Previously described atheromatous coronary artery calcifications are again noted. Concentric mid esophageal wall thickening is unchanged, measuring 8 mm in thickness on image number 37. Mild thoracic spine degenerative changes. Unremarkable upper abdomen.  Review of the MIP images confirms the above findings.  IMPRESSION: 1. Interval small areas of patchy, peripheral density in the right upper lobe, as described above. These have an appearance suspicious for small foci of infection. 2. Further regression of previously described small right upper lobe nodules. 3. No significant change in mid esophageal wall thickening, most likely due to postradiation changes. A mass is less likely. 4. Stable changes of COPD and chronic bronchitis. 5. Previously noted coronary artery atheromatous calcifications. 6. No pulmonary emboli. 7. Interval mild right hilar adenopathy, most likely reactive. 8. No significant change in mild mediastinal adenopathy.   Electronically Signed   By: SClaudie ReveringM.D.    On: 11/12/2014 13:38    ASSESSMENT AND PLAN:   This is a very pleasant 44years old African-American female recently diagnosed with   1) Stage IV (T3, N2, M1b) non-small cell lung cancer, adenocarcinoma with negative EGFR mutation and negative ALK gene rearrangement diagnosed in April 2016 and presenting with 2 pulmonary nodules in the right upper lobe as well as mediastinal lymphadenopathy and solitary brain lesion. Patient initially had Stereotactic radiotherapy to the solitary left posterior temporal brain lesion under the care of Dr. MTammi Klippelcompleted 07/03/2014. She was subsequently treated with concurrent chemoradiation with weekly carboplatin for AUC of 2 and paclitaxel 45 MG/M2. Status post 8 cycles completed 09/04/2014. Repeat CT chest on 10/06/2014 showed appropriate decrease in size of her right upper lobe lung nodules and mediastinal disease. Unfortunately her MRI of the brain showed a new right temporal parietal brain met which was  subsequently treated with SRS by Dr. Tammi Klippel.   #2 grade 2 Radiation esophagitis -resolving not much pain at rest but significant pain with swallowing food.Patient notes Carafate does not completely relieve pain.   #3 Memory problems-patient notes some increased forgetfulness likely related to radiation and chemotherapy. No focal neurological deficits. No seizures. No headaches.   #4 active tobacco abuse.  #5 anxiety not controlled with Remeron and Ativan Plan -Patient counseled to continue good oral intake and adequate fluid intake. -Continue PPI -Discuss with radiation oncology about tapering off her dexamethasone. -Switch to lorazepam to clonazepam for smoother control of generalized anxiety. -Given additional when necessary hydroxyzine for anxiety control. Referred to gastroenterology to consider EGD for persistent esophageal discomfort with deglutition to rule out other infectious etiologies of esophagitis that might need alternative treatment given  that her symptoms are persistent longer than what would typically be expected with radiation esophagitis. -Patient was again counseled about the importance of smoking cessation.  -She was counseled not to use her narcotic pain medications for other indications, other than her neoplasm/its treatment related pain -She is due to get a follow-up PET CT scan on 11/29/2014.  -Received message from Dr. Tammi Klippel that they would prefer getting the MRI of the brain on a 3 Tesla MRI machine and haven't discussed in brain tumor board. We will defer further follow-up imaging to radiation oncology for those specifics and allow them to handle scheduling.   -We will see her back in clinic in 6 weeks with labs.Earlier if the PET/CT scan shows any signs of disease progression systemically .  Patient was given the clinic contact information in case of any new questions or concerns. She confirmed understanding of the current disease status, treatment plan and follow plan. All her questions were answered to her apparent satisfaction.   Sullivan Lone MD MS Hematology/Oncology Physician [facility]  (Office):       [phone] (Work cell):  [phone] (Fax):           [phone]   Total time spent 25 minutes more than 50% of time on direct patient counseling, face-to-face communication, smoking cessation guidance, discussions of pain and anxiety management and follow-up plan and care co-ordination with radiation oncology.

## 2014-11-27 ENCOUNTER — Telehealth: Payer: Self-pay | Admitting: *Deleted

## 2014-11-27 NOTE — Telephone Encounter (Signed)
Pt's nephew- [name]- called to [facility] stating need to obtain refill on oxycodone for pick up tomorrw.  This RN informed [name] request will be sent to MD and RN and he will be contacted when available for pick up.  Return call number for [name] given as [phone].  Last refill was 11/10/2014 with 120 tabs which if taken as directed is a 15 day supply.  This note forwarded to Dr [name] and his nurse for refill and contact.

## 2014-11-28 ENCOUNTER — Other Ambulatory Visit: Payer: Self-pay | Admitting: Hematology

## 2014-11-28 ENCOUNTER — Encounter: Payer: Self-pay | Admitting: Gastroenterology

## 2014-11-28 ENCOUNTER — Telehealth: Payer: Self-pay | Admitting: Hematology

## 2014-11-28 ENCOUNTER — Telehealth: Payer: Self-pay | Admitting: *Deleted

## 2014-11-28 ENCOUNTER — Other Ambulatory Visit: Payer: Self-pay | Admitting: *Deleted

## 2014-11-28 DIAGNOSIS — C3411 Malignant neoplasm of upper lobe, right bronchus or lung: Secondary | ICD-10-CM

## 2014-11-28 DIAGNOSIS — M549 Dorsalgia, unspecified: Secondary | ICD-10-CM

## 2014-11-28 NOTE — Telephone Encounter (Signed)
per Delle Reining to follow-up on referral to [facility] for GI appt-cld & spoke to Stephanie/Wendy-stated pt needed NPI # since Medicaid-cld pt PCP on file and was told from that office pt no longer a patient there. Cld pt to inquire who PCP was and Dr name she gave me Dr Gracy Racer -cld that office and they had never seen patient before and wanted pt to call them-Called pt back to adv that she need to contact her Social worker to have updated card made w/PCP on it because we were unable to schedule without it-adv to bring in new card on next visit-called Abigail Butts back @ [facility] to advise I had Cancelled referral until updated card is received-adv Delle Reining to update Dr [name] on matter.

## 2014-11-28 NOTE — Telephone Encounter (Signed)
Patient's nephew Destiny [name] called reporting appointment has been set January 16, 2015 with Dr. Havery Moros at [facility].  Patient will have to wait a month and a half before she can be seen and she will then have to be scheduled for procedure after this November 8th visit.  This is a long time to wait to rececive pain medications.  Why does she have to wait?"   Will notify Dr. [name] of appointment to see new provider.  Per Destiny [name], "her pain occurs when it hits the middle of her chest".  Sounds like indigestion or heartburn to this nurse and suggested talk with pharmacist to see what OTC meds may help.  Protonox 40 mg bid on current med list.

## 2014-11-28 NOTE — Telephone Encounter (Signed)
Spoke to patient nephew Destiny [name] regarding message he left for oxycodone rx.  Per Dr. [name], pt was told we would not be able to refill that rx until patient had endoscopy done to provide [place] with more information about why patient is still experiencing so much throat pain.  Referral made to [facility] 9/12, no apt made.  Per [facility] in scheduling, [facility] had tried to contact patient with time and date for endoscopy, but had not received a call back from the patient.  Instructed nephew, Destiny [name] that [name] would need to have this done prior to getting any new rx for pain medication.  Phone number to [facility] given.

## 2014-11-29 ENCOUNTER — Ambulatory Visit (HOSPITAL_COMMUNITY): Payer: Medicaid Other

## 2014-11-29 NOTE — Progress Notes (Signed)
  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     Indication for treatment:  Palliation       Radiation treatment dates:   07/03/14  Site/dose/beams/energy:   Left temporal 92m target was treated using 4 Dynamic Conformal Arcs to a prescription dose of 20 Gy.  ExacTrac registration was performed for each couch angle.  The 81.3% isodose line was prescribed.  6 MV X-rays were delivered in the flattening filter free beam mode.  Narrative: The patient tolerated radiation treatment relatively well.     Plan: The patient has completed radiation treatment. The patient will return to radiation oncology clinic for routine followup in one month. I advised her to call or return sooner if she has any questions or concerns related to her recovery or treatment. ________________________________  MSheral Apley MTammi Klippel M.D.

## 2014-11-30 ENCOUNTER — Encounter: Payer: Self-pay | Admitting: Radiation Therapy

## 2014-11-30 ENCOUNTER — Other Ambulatory Visit: Payer: Self-pay | Admitting: Radiation Therapy

## 2014-11-30 ENCOUNTER — Encounter: Payer: Self-pay | Admitting: *Deleted

## 2014-11-30 DIAGNOSIS — C7931 Secondary malignant neoplasm of brain: Secondary | ICD-10-CM

## 2014-11-30 NOTE — Progress Notes (Signed)
[facility] Psychosocial Distress Screening Clinical Social Work  Clinical Social Work was referred by distress screening protocol.  The patient scored a 10 on the Psychosocial Distress Thermometer which indicates severe distress. Clinical Social Worker phoned pt at home to assess for distress and other psychosocial needs. Pt states her biggest issue is pain and not having access to her pain medications. Pt denies other concerns currently. Pt very upset about current situation. CSW provided supportive listening and education.   [facility] DISTRESS SCREENING 11/20/2014  Screening Type Change in Status  Distress experienced in past week (1-10) 10  Practical problem type Housing  Family Problem type Partner;Children  Emotional problem type Depression;Nervousness/Anxiety;Isolation/feeling alone;Boredom  Spiritual/Religous concerns type Relating to God  Physical Problem type Pain  Physician notified of physical symptoms Yes    Clinical Social Worker follow up needed: No.  If yes, follow up plan: Loren Racer, [place]  [facility] Phone: [phone] Fax: [phone]

## 2014-11-30 NOTE — Progress Notes (Addendum)
1.  Do you need a wheel chair?    no  2. On oxygen? no  3. Have you ever had any surgery in the body part being scanned?  no  4. Have you ever had any surgery on your brain or heart?   no                5. Have you ever had surgery on your eyes or ears?    no                                        6. Do you have a pacemaker or defibrillator?   no  7. Do you have a Neurostimulator?    No  8. Claustrophobic?  Yes takes Ativan  9. Any risk for metal in eyes?  no  10. Injury by bullet, buckshot, or shrapnel?  no  11. Stent?      no                                                                                                             12. Hx of Cancer?     Yes. Lung met to brain                                                                                           13. Kidney or Liver disease?  no  14. Hx of Lupus, Rheumatoid Arthritis or Scleroderma?  no  15. IV Antibiotics or long term use of NSAIDS?  no  16. HX of Hypertension?  yes  17. Diabetes?  no  18. Allergy to contrast?  no  19. Recent labs. To be drawn in October prior to her CT restaging scans

## 2014-12-04 ENCOUNTER — Telehealth: Payer: Self-pay | Admitting: *Deleted

## 2014-12-04 NOTE — Telephone Encounter (Addendum)
Opened in error

## 2014-12-04 NOTE — Telephone Encounter (Signed)
"  This is Destiny [name] calling in reference to [name].  This is the third call .  Can Dr. [name] prescribe something for pain.  It doesn't have to be a narcotic.  OTC Tylenol is not working.  She is suffering from insomnia, irritability and it's getting ridiculous.  Call her at [phone] or call me at [phone]."

## 2014-12-04 NOTE — Telephone Encounter (Signed)
Pt called triage at [phone] asking "to speak to someone". No other needs identified on VM.  I called pt to assess pt's needs, no answer, left VM to call us back.

## 2014-12-05 ENCOUNTER — Other Ambulatory Visit: Payer: Self-pay | Admitting: Hematology

## 2014-12-05 ENCOUNTER — Emergency Department (HOSPITAL_COMMUNITY)
Admission: EM | Admit: 2014-12-05 | Discharge: 2014-12-05 | Disposition: A | Discharge status: Home / Self Care | Payer: Medicaid Other | Attending: Emergency Medicine | Admitting: Emergency Medicine

## 2014-12-05 ENCOUNTER — Encounter (HOSPITAL_COMMUNITY): Payer: Self-pay | Admitting: *Deleted

## 2014-12-05 DIAGNOSIS — Z85118 Personal history of other malignant neoplasm of bronchus and lung: Secondary | ICD-10-CM | POA: Insufficient documentation

## 2014-12-05 DIAGNOSIS — F329 Major depressive disorder, single episode, unspecified: Secondary | ICD-10-CM | POA: Insufficient documentation

## 2014-12-05 DIAGNOSIS — Z8639 Personal history of other endocrine, nutritional and metabolic disease: Secondary | ICD-10-CM | POA: Insufficient documentation

## 2014-12-05 DIAGNOSIS — K209 Esophagitis, unspecified without bleeding: Secondary | ICD-10-CM

## 2014-12-05 DIAGNOSIS — Z8679 Personal history of other diseases of the circulatory system: Secondary | ICD-10-CM | POA: Insufficient documentation

## 2014-12-05 DIAGNOSIS — Z720 Tobacco use: Secondary | ICD-10-CM | POA: Diagnosis not present

## 2014-12-05 DIAGNOSIS — Z79899 Other long term (current) drug therapy: Secondary | ICD-10-CM | POA: Insufficient documentation

## 2014-12-05 DIAGNOSIS — Z85841 Personal history of malignant neoplasm of brain: Secondary | ICD-10-CM | POA: Diagnosis not present

## 2014-12-05 DIAGNOSIS — R1013 Epigastric pain: Secondary | ICD-10-CM | POA: Diagnosis present

## 2014-12-05 HISTORY — DX: Malignant neoplasm of unspecified part of unspecified bronchus or lung: C34.90

## 2014-12-05 HISTORY — DX: Secondary malignant neoplasm of brain: C79.31

## 2014-12-05 LAB — CBC
HCT: 37.2 % (ref 36.0–46.0)
Hemoglobin: 12.5 g/dL (ref 12.0–15.0)
MCH: 30.3 pg (ref 26.0–34.0)
MCHC: 33.6 g/dL (ref 30.0–36.0)
MCV: 90.1 fL (ref 78.0–100.0)
PLATELETS: 233 10*3/uL (ref 150–400)
RBC: 4.13 MIL/uL (ref 3.87–5.11)
RDW: 13.5 % (ref 11.5–15.5)
WBC: 7.1 10*3/uL (ref 4.0–10.5)

## 2014-12-05 LAB — COMPREHENSIVE METABOLIC PANEL
ALBUMIN: 3.4 g/dL — AB (ref 3.5–5.0)
ALT: 29 U/L (ref 14–54)
AST: 23 U/L (ref 15–41)
Alkaline Phosphatase: 62 U/L (ref 38–126)
Anion gap: 8 (ref 5–15)
BUN: 10 mg/dL (ref 6–20)
CHLORIDE: 105 mmol/L (ref 101–111)
CO2: 27 mmol/L (ref 22–32)
Calcium: 9.9 mg/dL (ref 8.9–10.3)
Creatinine, Ser: 0.71 mg/dL (ref 0.44–1.00)
GFR calc Af Amer: >60 mL/min (ref >60)
GFR calc non Af Amer: >60 mL/min (ref >60)
GLUCOSE: 94 mg/dL (ref 65–99)
POTASSIUM: 3.4 mmol/L — AB (ref 3.5–5.1)
Sodium: 140 mmol/L (ref 135–145)
Total Bilirubin: 0.3 mg/dL (ref 0.3–1.2)
Total Protein: 6.6 g/dL (ref 6.5–8.1)

## 2014-12-05 LAB — I-STAT TROPONIN, ED: Troponin i, poc: 0 ng/mL (ref 0.00–0.08)

## 2014-12-05 LAB — LIPASE, BLOOD: LIPASE: 23 U/L (ref 22–51)

## 2014-12-05 LAB — I-STAT CG4 LACTIC ACID, ED: LACTIC ACID, VENOUS: 0.95 mmol/L (ref 0.5–2.0)

## 2014-12-05 MED ORDER — FLUCONAZOLE 100 MG PO TABS: 200.0000 mg | ORAL_TABLET | Freq: Every day | ORAL | Status: DC

## 2014-12-05 MED ORDER — FLUCONAZOLE 100 MG PO TABS
100.0000 mg | ORAL_TABLET | Freq: Once | ORAL | Status: AC
  Administered 2014-12-05: 100 mg via ORAL
  Filled 2014-12-05: qty 1

## 2014-12-05 MED ORDER — SUCRALFATE 1 G PO TABS: 1.0000 g | ORAL_TABLET | Freq: Three times a day (TID) | ORAL | Status: DC

## 2014-12-05 MED ORDER — SODIUM CHLORIDE 0.9 % IV BOLUS (SEPSIS)
1000.0000 mL | Freq: Once | INTRAVENOUS | Status: AC
  Administered 2014-12-05: 1000 mL via INTRAVENOUS

## 2014-12-05 MED ORDER — OXYCODONE-ACETAMINOPHEN 5-325 MG PO TABS: 1.0000 | ORAL_TABLET | Freq: Four times a day (QID) | ORAL | Status: DC | PRN

## 2014-12-05 MED ORDER — HYDROMORPHONE HCL 1 MG/ML IJ SOLN
1.0000 mg | Freq: Once | INTRAMUSCULAR | Status: AC
  Administered 2014-12-05: 1 mg via INTRAVENOUS
  Filled 2014-12-05: qty 1

## 2014-12-05 MED ORDER — FLUCONAZOLE 100 MG PO TABS
200.0000 mg | ORAL_TABLET | Freq: Every day | ORAL | Status: DC
  Administered 2014-12-05: 200 mg via ORAL
  Filled 2014-12-05: qty 2

## 2014-12-05 MED ORDER — GI COCKTAIL ~~LOC~~
30.0000 mL | Freq: Once | ORAL | Status: AC
Start: 2014-12-05 — End: 2014-12-05
  Administered 2014-12-05: 30 mL via ORAL
  Filled 2014-12-05: qty 30

## 2014-12-05 NOTE — ED Notes (Signed)
Pt wheeled to waiting room to family who is transporting pt home. Pt verbalizes understanding of follow up and discharge instructions. NAD. A/O x4.

## 2014-12-05 NOTE — ED Notes (Signed)
Pt presents via POV c/o "esophagus pain" since July.  Pt reports lung cancer and had radiation in June, reports PNA and infection in esophagus in July, pain has continued since then, no relief with OTC medications. Pt also reports PNA in September. Pt a x 4, NAD, denies SOB.  +cough, nonproductive.

## 2014-12-05 NOTE — Telephone Encounter (Addendum)
Spoke to patient 12/04/14. Informed patient that MD [name] will not prescribe her anymore pain medication until an endoscopy is performed and has a visit with her PCP. Instructed patient that if pain gets any worse then she will most likely need to go to the ED. Patient states that she will most likely go to the ER and possibly change providers.

## 2014-12-05 NOTE — ED Provider Notes (Signed)
CSN: [id number]     Arrival date & time 12/05/14  7628 History   First MD Initiated Contact with Patient 12/05/14 1012     No chief complaint on file.    (Consider location/radiation/quality/duration/timing/severity/associated sxs/prior Treatment) Patient is a 44 y.o. female presenting with abdominal pain. The history is provided by the patient.  Abdominal Pain Pain location:  Epigastric Pain quality: burning and sharp   Pain radiation: chest and upper abdomen. Pain severity:  Moderate Onset quality:  Gradual Timing:  Constant Progression:  Worsening Chronicity:  Recurrent Context: recent illness (had radiation and chemo for lung cancer 3 months ago)   Context: not eating and not sick contacts   Relieved by:  Nothing Worsened by:  Eating Associated symptoms: no chest pain, no chills, no cough, no fever, no hematemesis, no shortness of breath and no vomiting     Past Medical History  Diagnosis Date  . Thyroid nodule   . Depression   . Suicide attempt   . SVT (supraventricular tachycardia)     a. Long RP tachycardia;  b. 01/2014 s/p RFCA.  . Metastatic lung cancer (metastasis from lung to other site) 06/2014    Stage IV (T3, N2, M1b) non-small cell lung cancer, adenocarcinoma; treated with chemo radiation (radiation completed 09/04/14).  Brain met treated with radiation 06/2014.   . Brain metastases     left brain met treated with radiation 06/2014. MRI 7/27 with new right brain met and shrunken left met.    Past Surgical History  Procedure Laterality Date  . Thyroid surgery      Removed thyroid nodule, states still has thyroid; 2010  . Supraventricular tachycardia ablation N/A 01/16/2014    Procedure: SUPRAVENTRICULAR TACHYCARDIA ABLATION;  Surgeon: Evans Lance, MD;  Location: [facility] CATH LAB;  Service: Cardiovascular;  Laterality: N/A;  . Tee without cardioversion N/A 06/12/2014    Procedure: TRANSESOPHAGEAL ECHOCARDIOGRAM (TEE);  Surgeon: Sueanne Margarita, MD;  Location: [facility]  ENDOSCOPY;  Service: Cardiovascular;  Laterality: N/A;   Family History  Problem Relation Age of Onset  . Seizures Mother   . Colon cancer Father   . Hypertension Father    Social History  Substance Use Topics  . Smoking status: Current Some Day Smoker -- 1.00 packs/day for 30 years    Types: Cigarettes    Last Attempt to Quit: 06/13/2014  . Smokeless tobacco: None  . Alcohol Use: No     Comment: occ now, used to drink moderately for 20 years    OB History    No data available     Review of Systems  Constitutional: Negative for fever, chills and unexpected weight change.  Respiratory: Negative for cough and shortness of breath.   Cardiovascular: Negative for chest pain and leg swelling.  Gastrointestinal: Negative for vomiting, abdominal pain and hematemesis.  All other systems reviewed and are negative.     Allergies  Review of patient's allergies indicates no known allergies.  Home Medications   Prior to Admission medications   Medication Sig Start Date End Date Taking? Authorizing Provider  clonazePAM (KLONOPIN) 0.5 MG tablet Take 1 tablet (0.5 mg total) by mouth 2 (two) times daily as needed for anxiety. 11/22/14  Yes Adrena E Johnson, PA-C  dexamethasone (DECADRON) 2 MG tablet Take 2 mg by mouth daily. 11/02/14  Yes Historical Provider, MD  hydrOXYzine (VISTARIL) 25 MG capsule Take 1 capsule (25 mg total) by mouth 3 (three) times daily as needed for itching or anxiety. 11/22/14  Yes  Carlton Adam, PA-C  lisinopril-hydrochlorothiazide (PRINZIDE,ZESTORETIC) 10-12.5 MG per tablet Take 1 tablet by mouth daily. 10/20/14  Yes Historical Provider, MD  mirtazapine (REMERON) 30 MG tablet Take 30 mg by mouth at bedtime.  11/09/14  Yes Historical Provider, MD  nicotine (NICODERM CQ - DOSED IN MG/24 HOURS) 21 mg/24hr patch Place 21 mg onto the skin daily.   Yes Historical Provider, MD  pantoprazole (PROTONIX) 40 MG tablet Take 40 mg by mouth 2 (two) times daily before a meal.   08/28/14 08/28/15 Yes Historical Provider, MD  Diphenhyd-Lidocaine-Nystatin (FIRST-BXN MOUTHWASH) SUSP Take 5 mLs by mouth 4 (four) times daily. Patient not taking: Reported on 12/05/2014 10/23/14   Brunetta Genera, MD  lidocaine-prilocaine (EMLA) cream Apply topically 2 (two) times daily as needed (painful skin on chest). 09/11/14   Modena Jansky, MD  sucralfate (CARAFATE) 1 GM/10ML suspension Take 10 mLs (1 g total) by mouth 4 (four) times daily -  with meals and at bedtime. 10/23/14   Susanne Borders, NP   BP 132/77 mmHg  Pulse 72  Temp(Src) 97.9 F (36.6 C) (Oral)  Resp 14  Ht 5' 7" (1.702 m)  Wt 194 lb (87.998 kg)  BMI 30.38 kg/m2  SpO2 100%  LMP 06/12/2014 Physical Exam  Constitutional: She is oriented to person, place, and time. She appears well-developed and well-nourished. No distress.  HENT:  Head: Normocephalic and atraumatic.  Mouth/Throat: Oropharynx is clear and moist.  Eyes: EOM are normal. Pupils are equal, round, and reactive to light.  Neck: Normal range of motion. Neck supple.  Cardiovascular: Normal rate and regular rhythm.  Exam reveals no friction rub.   No murmur heard. Pulmonary/Chest: Effort normal and breath sounds normal. No respiratory distress. She has no wheezes. She has no rales.  Abdominal: Soft. She exhibits no distension. There is tenderness (mild epigastric). There is no rebound.  Musculoskeletal: Normal range of motion. She exhibits no edema.  Neurological: She is alert and oriented to person, place, and time. No cranial nerve deficit. She exhibits normal muscle tone. Coordination normal.  Skin: No rash noted. She is not diaphoretic.  Nursing note and vitals reviewed.   ED Course  Procedures (including critical care time) Labs Review Labs Reviewed  COMPREHENSIVE METABOLIC PANEL - Abnormal; Notable for the following:    Potassium 3.4 (*)    Albumin 3.4 (*)    All other components within normal limits  CBC  LIPASE, BLOOD  I-STAT CG4 LACTIC  ACID, ED  I-STAT TROPOININ, ED    Imaging Review No results found. I have personally reviewed and evaluated these images and lab results as part of my medical decision-making.   EKG Interpretation   Date/Time:  Tuesday December 05 2014 10:24:52 EDT Ventricular Rate:  80 PR Interval:  195 QRS Duration: 103 QT Interval:  430 QTC Calculation: 496 R Axis:   -13 Text Interpretation:  Sinus rhythm ST elevation, consider inferior injury  Borderline prolonged QT interval No significant change since last tracing  Confirmed by Mingo Amber  MD, [place] ([id number]) on 12/05/2014 10:33:10 AM      MDM   Final diagnoses:  Esophagitis    44 yo female here with chest pain and pain when swallowing. History of recent radiation and chemo forl lung cancer 3 months ago. States she needs her pain meds from her regular doctor, but hasn't seen her new PCP recently. Hx of esophagitis, no major relief with carafate. She has been out of her pain meds for over one  week. Denies vomiting, weight loss, fever. No difficulty breathing or chest pain.  Reports this pain is worse with eating.  She was referred to [facility] to get an EGD, however was unable to go due to insurance reasons. GI needs a referral Patient here with benign exam. No signs of dehydration. Labs are normal.  No reason for admission, no weight loss, labs normal. GI cannot see her without a referral, I contacted her PCP who can help with a sooner appointment in 1 month rather than 2 months. Given fluconazole and sucralfate to help with her pain and esophagitis. She described the pain as burning going up and down her substernal area and her epigastric region and similar to prior esophagitis, so will treat for same. Well appearing, relaxing comfortably.  Stable for discharge.   Evelina Bucy, MD 12/05/14 [phone]

## 2014-12-05 NOTE — Discharge Instructions (Signed)
Please follow up with your primary doctor as soon as possible so you can be referred to GI for your endoscopy.  Esophagitis Esophagitis is inflammation of the esophagus. It can involve swelling, soreness, and pain in the esophagus. This condition can make it difficult and painful to swallow. CAUSES  Most causes of esophagitis are not serious. Many different factors can cause esophagitis, including:  Gastroesophageal reflux disease (GERD). This is when acid from your stomach flows up into the esophagus.  Recurrent vomiting.  An allergic-type reaction.  Certain medicines, especially those that come in large pills.  Ingestion of harmful chemicals, such as household cleaning products.  Heavy alcohol use.  An infection of the esophagus.  Radiation treatment for cancer.  Certain diseases such as sarcoidosis, Crohn's disease, and scleroderma. These diseases may cause recurrent esophagitis. SYMPTOMS   Trouble swallowing.  Painful swallowing.  Chest pain.  Difficulty breathing.  Nausea.  Vomiting.  Abdominal pain. DIAGNOSIS  Your caregiver will take your history and do a physical exam. Depending upon what your caregiver finds, certain tests may also be done, including:  Barium X-ray. You will drink a solution that coats the esophagus, and X-rays will be taken.  Endoscopy. A lighted tube is put down the esophagus so your caregiver can examine the area.  Allergy tests. These can sometimes be arranged through follow-up visits. TREATMENT  Treatment will depend on the cause of your esophagitis. In some cases, steroids or other medicines may be given to help relieve your symptoms or to treat the underlying cause of your condition. Medicines that may be recommended include:  Viscous lidocaine, to soothe the esophagus.  Antacids.  Acid reducers.  Proton pump inhibitors.  Antiviral medicines for certain viral infections of the esophagus.  Antifungal medicines for certain  fungal infections of the esophagus.  Antibiotic medicines, depending on the cause of the esophagitis. HOME CARE INSTRUCTIONS   Avoid foods and drinks that seem to make your symptoms worse.  Eat small, frequent meals instead of large meals.  Avoid eating for the 3 hours prior to your bedtime.  If you have trouble taking pills, use a pill splitter to decrease the size and likelihood of the pill getting stuck or injuring the esophagus on the way down. Drinking water after taking a pill also helps.  Stop smoking if you smoke.  Maintain a healthy weight.  Wear loose-fitting clothing. Do not wear anything tight around your waist that causes pressure on your stomach.  Raise the head of your bed 6 to 8 inches with wood blocks to help you sleep. Extra pillows will not help.  Only take over-the-counter or prescription medicines as directed by your caregiver. SEEK IMMEDIATE MEDICAL CARE IF:  You have severe chest pain that radiates into your arm, neck, or jaw.  You feel sweaty, dizzy, or lightheaded.  You have shortness of breath.  You vomit blood.  You have difficulty or pain with swallowing.  You have bloody or black, tarry stools.  You have a fever.  You have a burning sensation in the chest more than 3 times a week for more than 2 weeks.  You cannot swallow, drink, or eat.  You drool because you cannot swallow your saliva. MAKE SURE YOU:  Understand these instructions.  Will watch your condition.  Will get help right away if you are not doing well or get worse. Document Released: 04/03/2004 Document Revised: 05/19/2011 Document Reviewed: 10/25/2010 [facility] Patient Information 2015 [place], [place]. This information is not intended to replace  advice given to you by your health care provider. Make sure you discuss any questions you have with your health care provider.

## 2014-12-05 NOTE — Progress Notes (Signed)
Summary documentation    My clinic has received significant number of phone calls repeatedly from Mrs. Delavega demanding specific pain medications.  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  Then she noted that she is still having some odynophagia with swallowing.  Uncertain that if she is using Magic mouthwash and sucralfate as instructed.  She has used up her Percocet much faster then she was recommended.  We give her GI referral urgently to have an EGD to evaluate for other possible esophageal pathologies including Candida/HSV/CMV  Esophagitis but Medicaid would not allow me to give her a referral for this and needed it to come from her primary care physician.  She was I asked to call her primary care physician to get this referral and  To set up the pain medication care plan for her chronic back pain issues and other non-oncologic pain issues.  We found out that her primary care physician had discharged her  Likely due to noncompliance and pain medication issues.  She has changed 3 oncology providers in our group including Dr. Burr Medico,  Dr. Earlie Server and I.  She was also given a referral to [facility] pain management clinic which also Medicaid would not allow me to proceed with.  This will need to come from her primary care physician.   She has  threatened my nurse about changing providers several times if not given the specific pain medications she is requesting.  We have recommended to her that she is always welcome to get a fourth oncology opinion if she chooses to.  If her pain is out of control and is in fact actually requiring the amount of pain medications that she claims she is using then she needs to go to the emergency room for an urgent evaluation with imaging and urgent EGD.  If specific treatment related or oncologic pathology is noted it would justify appropriate pain management.  She was instructed by  scheduling to set up a primary care physician through her Medicaid provider network and this has been done for next week.  She was scheduled for a PET/CT scan which medicaid refused to pay for. This was changed to CT chest/abd/pelvis scheduled for 12/06/2014.  I would strongly recommend that the patient transfer cares to a medical Oncology provider that she can have a more connected healing relationship with. Also the fact that her medication use is non standard and she notes difficult with remembering things and significant anxiety issues makes careful management of polypharmacy imperative and places additional burden on [place] to avoid overdoses of opiates.  Sullivan Lone MD MS

## 2014-12-06 ENCOUNTER — Ambulatory Visit (HOSPITAL_COMMUNITY): Payer: Medicaid Other

## 2014-12-06 ENCOUNTER — Telehealth: Payer: Self-pay | Admitting: Hematology

## 2014-12-06 NOTE — Telephone Encounter (Signed)
Patient called requesting that we fax her medical records to [facility] at Fax # [phone]. Faxed HIM/Medical Records documents.       [facility].

## 2014-12-11 ENCOUNTER — Telehealth: Payer: Self-pay | Admitting: *Deleted

## 2014-12-11 NOTE — Telephone Encounter (Signed)
"  [name] calling for [employer] for my mom, Destiny [name].  Her phone '[phone]' is disconnected.  She needs steroid, dexamethasone refilled.  Please cal me at [phone]."

## 2014-12-12 ENCOUNTER — Telehealth: Payer: Self-pay | Admitting: Radiation Oncology

## 2014-12-12 NOTE — Telephone Encounter (Signed)
Phoned Destiny [name], patient's stepson. Explained that a decadron refill request has been sent to Dr. Tammi Klippel and this RN will call them back with further directions. Destiny [name] verbalized understanding and expressed appreciation for the call. He explained his stepmother has one pill left.

## 2014-12-12 NOTE — Telephone Encounter (Signed)
Status: Signed       Expand All Collapse All   Phoned [name], patient's stepson. Explained that a decadron refill request has been sent to Dr. Tammi Klippel and this RN will call them back with further directions. [name] verbalized understanding and expressed appreciation for the call. He explained his stepmother has one pill left.

## 2014-12-13 ENCOUNTER — Telehealth: Payer: Self-pay | Admitting: Radiation Oncology

## 2014-12-13 ENCOUNTER — Other Ambulatory Visit: Payer: Self-pay | Admitting: Radiation Oncology

## 2014-12-13 ENCOUNTER — Telehealth: Payer: Self-pay

## 2014-12-13 ENCOUNTER — Telehealth (HOSPITAL_BASED_OUTPATIENT_CLINIC_OR_DEPARTMENT_OTHER): Payer: Self-pay | Admitting: Emergency Medicine

## 2014-12-13 DIAGNOSIS — C7931 Secondary malignant neoplasm of brain: Secondary | ICD-10-CM

## 2014-12-13 MED ORDER — DEXAMETHASONE 2 MG PO TABS: 2.0000 mg | ORAL_TABLET | Freq: Every day | ORAL | Status: DC

## 2014-12-13 NOTE — Telephone Encounter (Signed)
Ms. Ballengee calling to find out why ct scan is not scheduled.   Discussed with Delle Reining RN with Dr. [name].  Medicaid requires prior authorization for CT. Dr. Grier Mitts nurse called to see if appointment could be moved up with PCP.  Nurse told that Ms. Polyakov is no longer a patient at the practice.  In the meantime pt has self referred to [facility]. Tried to explain this situation to Ms. Wilison.   Pt feels she is not being treated well.  She has a call into legal aid regarding her care at the cancer center.  Told pt. that if she was in severe she needed to go to the ED to be evaluated.  Pt does not want to keep going to the ED for pain medication. Told Ms. Gaskins that the Diflucan prescribed in the ED was to help with the "infection in her throat" Told her that Dr. [name] will not prescribe narcotics for her pain at this time.   Conversation ended.

## 2014-12-13 NOTE — Telephone Encounter (Signed)
Patient left message on this RN's voicemail requesting a return call. Phoned patient. No answer. Left message.

## 2014-12-14 ENCOUNTER — Telehealth: Payer: Self-pay | Admitting: Hematology

## 2014-12-14 NOTE — Telephone Encounter (Signed)
Please see 9/20 note from scheduler re GI appointment. The referral for the pain clinic has also been closed out as the same information applies. Desk nurse was informed at that time.

## 2014-12-15 ENCOUNTER — Telehealth: Payer: Self-pay | Admitting: Radiation Oncology

## 2014-12-15 ENCOUNTER — Telehealth: Payer: Self-pay | Admitting: *Deleted

## 2014-12-15 ENCOUNTER — Encounter: Payer: Self-pay | Admitting: *Deleted

## 2014-12-15 NOTE — Telephone Encounter (Signed)
On 12-15-14 fax medical records to per pt request to [phone], it was Dr. Tammi Klippel notes.

## 2014-12-15 NOTE — Progress Notes (Signed)
Oncology Nurse Navigator Documentation  Oncology Nurse Navigator Flowsheets 12/15/2014  Navigator Encounter Type Telephone/I received a message from [name] in radiation.  She she updated me that patient is unhappy with her care. I called [name] to follow up.  Patient needs pain medication and GI referral.   I spoke with Dr. [name] and his nurse Ander Purpura today about patient complaints.  Patient has been referred to Pain clinic and GI but do to not having PCP and having medicaid, she is unable to be seen.   I called Destiny [name] and asked how I can help.  Her voice was soft with slurred speech.  I listened as she explained.  I tried to explain about her needing a PCP before referrals can be made.  She stated she will see PCP on 12/28/14/  She also stated she has filed a complaint and spoken with [employer].  I asked what she was doing for her pain and she stated she received medication from ED and was happy with that.  Patient is not on active treatment and I only encouraged her to go to ED again if needed for pain management. Patient would like to see another provider and I notified Dr. [name].    Patient Visit Type Follow-up  Treatment Phase -  Barriers/Navigation Needs -  Interventions -  Support Groups/Services -  Time Spent with Patient 30

## 2014-12-15 NOTE — Telephone Encounter (Signed)
Patient phoned today requesting "help" from this RN and Dr. Tammi Klippel. Patient expresses several concerns. Ultimately, patient requesting a referral to a gastroenterologist for an EGD to determine why she continues to have several esophageal pain worse while eating. Also, patient requesting her CT of the body be reschedule. Will forward request to Dr. Tammi Klippel. Patient understands this RN will call her back with further directions.

## 2014-12-18 ENCOUNTER — Telehealth: Payer: Self-pay | Admitting: Hematology

## 2014-12-18 NOTE — Telephone Encounter (Signed)
Left message for patient to return call [phone] to give d/t for appts.  Pain Clinic referral authorized npi # attached to referral.  Patient aware of [facility] Appt 10/25 @ 6 a.m she is aware this visit will take 2-3hrs long [phone].

## 2014-12-19 ENCOUNTER — Encounter: Payer: Self-pay | Admitting: Gastroenterology

## 2014-12-19 ENCOUNTER — Telehealth: Payer: Self-pay | Admitting: *Deleted

## 2014-12-19 NOTE — Telephone Encounter (Signed)
Norton Blizzard informed

## 2014-12-19 NOTE — Telephone Encounter (Signed)
Voicemail from patient.  "Please return call from Dr. Grier Mitts nurse to [phone]." Voicemail from son.  "We missed a call from Destiny [name] on Friday.  Please call [phone].  If you have more information for [place]."

## 2014-12-20 ENCOUNTER — Encounter (HOSPITAL_COMMUNITY): Payer: Self-pay | Admitting: Emergency Medicine

## 2014-12-20 ENCOUNTER — Emergency Department (HOSPITAL_COMMUNITY): Payer: Medicaid Other

## 2014-12-20 ENCOUNTER — Emergency Department (HOSPITAL_COMMUNITY)
Admission: EM | Admit: 2014-12-20 | Discharge: 2014-12-21 | Disposition: A | Discharge status: Home / Self Care | Payer: Medicaid Other | Source: Home / Self Care | Attending: Emergency Medicine | Admitting: Emergency Medicine

## 2014-12-20 ENCOUNTER — Telehealth: Payer: Self-pay | Admitting: *Deleted

## 2014-12-20 ENCOUNTER — Emergency Department (HOSPITAL_COMMUNITY)
Admission: EM | Admit: 2014-12-20 | Discharge: 2014-12-20 | Discharge status: Against Medical Advice | Payer: Medicaid Other | Attending: Emergency Medicine | Admitting: Emergency Medicine

## 2014-12-20 DIAGNOSIS — F329 Major depressive disorder, single episode, unspecified: Secondary | ICD-10-CM | POA: Diagnosis not present

## 2014-12-20 DIAGNOSIS — Z720 Tobacco use: Secondary | ICD-10-CM | POA: Diagnosis not present

## 2014-12-20 DIAGNOSIS — K209 Esophagitis, unspecified without bleeding: Secondary | ICD-10-CM

## 2014-12-20 DIAGNOSIS — R110 Nausea: Secondary | ICD-10-CM | POA: Insufficient documentation

## 2014-12-20 DIAGNOSIS — Z7952 Long term (current) use of systemic steroids: Secondary | ICD-10-CM | POA: Insufficient documentation

## 2014-12-20 DIAGNOSIS — Z85118 Personal history of other malignant neoplasm of bronchus and lung: Secondary | ICD-10-CM | POA: Diagnosis not present

## 2014-12-20 DIAGNOSIS — Z9089 Acquired absence of other organs: Secondary | ICD-10-CM | POA: Insufficient documentation

## 2014-12-20 DIAGNOSIS — Z8639 Personal history of other endocrine, nutritional and metabolic disease: Secondary | ICD-10-CM | POA: Diagnosis not present

## 2014-12-20 DIAGNOSIS — Z8679 Personal history of other diseases of the circulatory system: Secondary | ICD-10-CM | POA: Diagnosis not present

## 2014-12-20 DIAGNOSIS — K228 Other specified diseases of esophagus: Secondary | ICD-10-CM

## 2014-12-20 DIAGNOSIS — Z85841 Personal history of malignant neoplasm of brain: Secondary | ICD-10-CM | POA: Diagnosis not present

## 2014-12-20 DIAGNOSIS — K2289 Other specified disease of esophagus: Secondary | ICD-10-CM

## 2014-12-20 DIAGNOSIS — Z79899 Other long term (current) drug therapy: Secondary | ICD-10-CM | POA: Diagnosis not present

## 2014-12-20 DIAGNOSIS — R1013 Epigastric pain: Secondary | ICD-10-CM | POA: Diagnosis present

## 2014-12-20 LAB — COMPREHENSIVE METABOLIC PANEL
ALK PHOS: 79 U/L (ref 38–126)
ALT: 28 U/L (ref 14–54)
ANION GAP: 10 (ref 5–15)
AST: 24 U/L (ref 15–41)
Albumin: 3.6 g/dL (ref 3.5–5.0)
BILIRUBIN TOTAL: 0.3 mg/dL (ref 0.3–1.2)
BUN: 13 mg/dL (ref 6–20)
CALCIUM: 10.8 mg/dL — AB (ref 8.9–10.3)
CO2: 27 mmol/L (ref 22–32)
Chloride: 98 mmol/L — ABNORMAL LOW (ref 101–111)
Creatinine, Ser: 0.82 mg/dL (ref 0.44–1.00)
GLUCOSE: 101 mg/dL — AB (ref 65–99)
Potassium: 4 mmol/L (ref 3.5–5.1)
Sodium: 135 mmol/L (ref 135–145)
TOTAL PROTEIN: 7.3 g/dL (ref 6.5–8.1)

## 2014-12-20 LAB — CBC WITH DIFFERENTIAL/PLATELET
Basophils Absolute: 0 10*3/uL (ref 0.0–0.1)
Basophils Relative: 0 %
Eosinophils Absolute: 0 10*3/uL (ref 0.0–0.7)
Eosinophils Relative: 1 %
HEMATOCRIT: 41.1 % (ref 36.0–46.0)
HEMOGLOBIN: 14.1 g/dL (ref 12.0–15.0)
LYMPHS ABS: 1.3 10*3/uL (ref 0.7–4.0)
Lymphocytes Relative: 19 %
MCH: 30.3 pg (ref 26.0–34.0)
MCHC: 34.3 g/dL (ref 30.0–36.0)
MCV: 88.4 fL (ref 78.0–100.0)
MONO ABS: 0.7 10*3/uL (ref 0.1–1.0)
MONOS PCT: 10 %
NEUTROS PCT: 70 %
Neutro Abs: 4.8 10*3/uL (ref 1.7–7.7)
Platelets: 256 10*3/uL (ref 150–400)
RBC: 4.65 MIL/uL (ref 3.87–5.11)
RDW: 13.6 % (ref 11.5–15.5)
WBC: 6.8 10*3/uL (ref 4.0–10.5)

## 2014-12-20 LAB — TROPONIN I

## 2014-12-20 LAB — ACETAMINOPHEN LEVEL

## 2014-12-20 LAB — LIPASE, BLOOD: LIPASE: 31 U/L (ref 22–51)

## 2014-12-20 MED ORDER — OXYCODONE HCL 5 MG PO TABS
5.0000 mg | ORAL_TABLET | Freq: Once | ORAL | Status: AC
  Administered 2014-12-20: 5 mg via ORAL
  Filled 2014-12-20: qty 1

## 2014-12-20 MED ORDER — GI COCKTAIL ~~LOC~~
30.0000 mL | Freq: Once | ORAL | Status: AC
  Administered 2014-12-20: 30 mL via ORAL
  Filled 2014-12-20: qty 30

## 2014-12-20 MED ORDER — MORPHINE SULFATE (PF) 4 MG/ML IV SOLN: 4.0000 mg | Freq: Once | INTRAVENOUS | Status: DC

## 2014-12-20 NOTE — Telephone Encounter (Signed)
Thanks [name],  I think med-onc placed orders for follow-up CT imagine and GI consult.  These have not yet been completed.  I will defer to their schedulers to execute.  We will continue to follow the brain and an MRI is scheduled for that.  MM

## 2014-12-20 NOTE — ED Notes (Signed)
Pt reports pain is doing better.

## 2014-12-20 NOTE — ED Notes (Signed)
Pt ambulated to restroom without distress.  

## 2014-12-20 NOTE — ED Notes (Addendum)
Pt states because of family emergency she has to leave right now. Pt agreed to stay in room until Dr. Wyvonnia Dusky came to talk to her. Dr. Wyvonnia Dusky explained that GI dr had been paged and we were waiting to hear back from him. Pt states she has no time to wait and has to leave right now. Pt explained that she needs to be evaluated by GI DR so a plan of care can be made for pt. Risks of leaving explained to pt by DR. Rancour. Pt still request to leave. Pt came out into hallway and left without signing AMA form.

## 2014-12-20 NOTE — Telephone Encounter (Signed)
Oncology Nurse Navigator Documentation  Oncology Nurse Navigator Flowsheets 12/20/2014  Navigator Encounter Type Telephone/Patient left me a vm message to call.  I called and spoke with patient.  She would like to see another med onc here, she would like her CT Chest to be set up and she would like pain medication.  I told her I would get back with her about follow up.    Patient Visit Type Follow-up  Treatment Phase Other  Barriers/Navigation Needs -  Interventions -  Support Groups/Services -  Time Spent with Patient 30

## 2014-12-20 NOTE — ED Notes (Signed)
Patient has lung cancer and stopped radiation treatment in June 2016. States since then have pain when eating and drink.  Seen in ED today and while being seen had to leave for a family emergency.  Came back to be seen again and finish the evaluation.

## 2014-12-20 NOTE — ED Notes (Signed)
Patient called for triage 3x with no answer

## 2014-12-20 NOTE — ED Notes (Signed)
Patient came to the nurse first desk and asked when she will be next. Stated did not hear name being called.

## 2014-12-20 NOTE — ED Notes (Signed)
Patient transported to X-ray 

## 2014-12-20 NOTE — Telephone Encounter (Signed)
I spoke with executive secretary about plan for Destiny [name].  She stated she will speak with Dr. Rolanda Jay.  She did and got back with me. She stated patient experience, Destiny [name] will be setting up appt with her to see social work and physician to figure out best plan of care.    I called patient to update.
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
## 2014-12-20 NOTE — ED Notes (Signed)
Pt had radiation for cancer in lung and brain finished June 24th. FOund out about cancer in April. States still having pain in esophagus. MD states she needs to be scoped and will not get more pain meds until then. Pt cannot get appt till November. States she does not feel had good care at cancer center. Supposed to have PET scan last month and did not get it and has not been rescheduled.

## 2014-12-20 NOTE — ED Provider Notes (Signed)
CSN: [id number]     Arrival date & time 12/20/14  2027 History  By signing my name below, I, Evelene Croon, attest that this documentation has been prepared under the direction and in the presence of Everlene Balls, MD . Electronically Signed: Evelene Croon, Scribe. 12/21/2014. 12:08 AM.     Chief Complaint  Patient presents with  . Gastrophageal Reflux   The history is provided by the patient. No language interpreter was used.    HPI Comments:  Destiny [name] is a 44 y.o. female with a history of lung CA, who presents to the Emergency Department complaining of moderate epigastric pain. She notes she has been experiencing the same pain since her last radiation on June 24th, 2016. Her pain is worse after drinking soda or after eating. She has taken oxycodone for her pain with moderate relief but notes she is out. Pt denies nausea and vomiting.   Past Medical History  Diagnosis Date  . Thyroid nodule   . Depression   . Suicide attempt ([place])   . SVT (supraventricular tachycardia) ([place])     a. Long RP tachycardia;  b. 01/2014 s/p RFCA.  . Metastatic lung cancer (metastasis from lung to other site) ([place]) 06/2014    Stage IV (T3, N2, M1b) non-small cell lung cancer, adenocarcinoma; treated with chemo radiation (radiation completed 09/04/14).  Brain met treated with radiation 06/2014.   . Brain metastases ([place])     left brain met treated with radiation 06/2014. MRI 7/27 with new right brain met and shrunken left met.    Past Surgical History  Procedure Laterality Date  . Thyroid surgery      Removed thyroid nodule, states still has thyroid; 2010  . Supraventricular tachycardia ablation N/A 01/16/2014    Procedure: SUPRAVENTRICULAR TACHYCARDIA ABLATION;  Surgeon: Evans Lance, MD;  Location: [facility] CATH LAB;  Service: Cardiovascular;  Laterality: N/A;  . Tee without cardioversion N/A 06/12/2014    Procedure: TRANSESOPHAGEAL ECHOCARDIOGRAM (TEE);  Surgeon: Sueanne Margarita, MD;  Location: [facility] ENDOSCOPY;   Service: Cardiovascular;  Laterality: N/A;   Family History  Problem Relation Age of Onset  . Seizures Mother   . Colon cancer Father   . Hypertension Father    Social History  Substance Use Topics  . Smoking status: Current Some Day Smoker -- 1.00 packs/day for 30 years    Types: Cigarettes    Last Attempt to Quit: 06/13/2014  . Smokeless tobacco: None  . Alcohol Use: No     Comment: occ now, used to drink moderately for 20 years    OB History    No data available     Review of Systems  A complete 10 system review of systems was obtained and all systems are negative except as noted in the HPI and PMH.    Allergies  Review of patient's allergies indicates no known allergies.  Home Medications   Prior to Admission medications   Medication Sig Start Date End Date Taking? Authorizing Provider  acetaminophen (TYLENOL) 500 MG tablet Take 1,000 mg by mouth every 6 (six) hours as needed (pain).    Historical Provider, MD  clonazePAM (KLONOPIN) 0.5 MG tablet Take 1 tablet (0.5 mg total) by mouth 2 (two) times daily as needed for anxiety. 11/22/14   Carlton Adam, PA-C  dexamethasone (DECADRON) 2 MG tablet Take 1 tablet (2 mg total) by mouth daily. 12/13/14   Tyler Pita, MD  hydrOXYzine (VISTARIL) 25 MG capsule Take 1 capsule (25 mg total)  by mouth 3 (three) times daily as needed for itching or anxiety. 11/22/14   Carlton Adam, PA-C  lidocaine-prilocaine (EMLA) cream Apply topically 2 (two) times daily as needed (painful skin on chest). Patient not taking: Reported on 12/20/2014 09/11/14   Modena Jansky, MD  lisinopril-hydrochlorothiazide (PRINZIDE,ZESTORETIC) 10-12.5 MG per tablet Take 1 tablet by mouth daily. 10/20/14   Historical Provider, MD  mirtazapine (REMERON) 30 MG tablet Take 30 mg by mouth at bedtime.  11/09/14   Historical Provider, MD  pantoprazole (PROTONIX) 40 MG tablet Take 40 mg by mouth 2 (two) times daily before a meal.  08/28/14 08/28/15  Historical Provider,  MD  sucralfate (CARAFATE) 1 GM/10ML suspension Take 10 mLs (1 g total) by mouth 4 (four) times daily -  with meals and at bedtime. 10/23/14   Susanne Borders, NP   BP 106/88 mmHg  Pulse 106  Temp(Src) 97.7 F (36.5 C) (Oral)  Resp 18  Ht 5' 7"  (1.702 m)  Wt 194 lb (87.998 kg)  BMI 30.38 kg/m2  SpO2 99% Physical Exam  Constitutional: She is oriented to person, place, and time. She appears well-developed and well-nourished. No distress.  HENT:  Head: Normocephalic and atraumatic.  Nose: Nose normal.  Mouth/Throat: Oropharynx is clear and moist. No oropharyngeal exudate.  Eyes: Conjunctivae and EOM are normal. Pupils are equal, round, and reactive to light. No scleral icterus.  Neck: Normal range of motion. Neck supple. No JVD present. No tracheal deviation present. No thyromegaly present.  Cardiovascular: Regular rhythm and normal heart sounds.  Tachycardia present.  Exam reveals no gallop and no friction rub.   No murmur heard. Pulmonary/Chest: Effort normal and breath sounds normal. No respiratory distress. She has no wheezes. She exhibits no tenderness.  Abdominal: Soft. Bowel sounds are normal. She exhibits no distension and no mass. There is no tenderness. There is no rebound and no guarding.  Musculoskeletal: Normal range of motion. She exhibits no edema or tenderness.  Lymphadenopathy:    She has no cervical adenopathy.  Neurological: She is alert and oriented to person, place, and time. No cranial nerve deficit. She exhibits normal muscle tone.  Skin: Skin is warm and dry. No rash noted. No erythema. No pallor.  Nursing note and vitals reviewed.   ED Course  Procedures   DIAGNOSTIC STUDIES:  Oxygen Saturation is 99% on RA, normal by my interpretation.    COORDINATION OF CARE:  12:11 AM Discussed treatment plan with pt at bedside and pt agreed to plan.  Labs Review Labs Reviewed - No data to display  Imaging Review Dg Chest 2 View  12/20/2014  CLINICAL DATA:   Pain.  Radiation treatment . EXAM: CHEST  2 VIEW COMPARISON:  CT 11/12/2014.  Chest x-ray 09/08/2014 . FINDINGS: Mediastinum and hilar structures are normal. Persistent increased densities in the right upper lobe. Findings could represent mild residual tumor, mild infection, and/or postradiation change . Similar findings noted on prior exam P No pleural effusion or pneumothorax. Heart size normal. No acute bony abnormality . IMPRESSION: Persistent small densities in the right upper lobe. Similar findings noted on prior exam. Findings could represent mild residual tumor, mild infection, and/or postradiation change. Electronically Signed   By: Marcello Moores  Register   On: 12/20/2014 10:41   I have personally reviewed and evaluated these images and lab results as part of my medical decision-making.   EKG Interpretation None      MDM   Final diagnoses:  None   Patient presents  emergency department for mid epigastric abdominal pain. She states this is consistent with her prior esophagitis. She states only pain medications helped her, she has tried GI medications without any relief. Patient was given IV fluids in the emergency department for tachycardia. For pain control she was given sucralfate, Reglan, Zantac, morphine. Plan to provide GI follow-up for EGD.  Patient is refusing IV and fluids. Her tachycardia has gone down to 101, she was advised this risk of being discharged still tachycardic, and the patient is okay with this. Patient will be provided with prescription for Norco to take as needed. She otherwise appears well-developed acute distress. Patient is safe for discharge.   I, Priscella Donna, personally performed the services described in this documentation. All medical record entries made by the scribe were at my direction and in my presence.  I have reviewed the chart and discharge instructions and agree that the record reflects my personal performance and is accurate and complete. Tashika Goodin.   12/21/2014. 12:48 AM.       Everlene Balls, MD 12/21/14 [phone]

## 2014-12-20 NOTE — ED Notes (Signed)
Pt reports pain down her throat and into stomach is chronic but worse when she eats food; has to chase food down with water every time.

## 2014-12-21 ENCOUNTER — Telehealth: Payer: Self-pay | Admitting: Gastroenterology

## 2014-12-21 MED ORDER — HYDROCODONE-ACETAMINOPHEN 5-325 MG PO TABS
1.0000 | ORAL_TABLET | Freq: Two times a day (BID) | ORAL | Status: DC | PRN
Start: 2014-12-21 — End: 2014-12-29

## 2014-12-21 MED ORDER — SUCRALFATE 1 GM/10ML PO SUSP
1.0000 g | Freq: Once | ORAL | Status: AC
  Administered 2014-12-21: 1 g via ORAL
  Filled 2014-12-21: qty 10

## 2014-12-21 MED ORDER — RANITIDINE HCL 150 MG/10ML PO SYRP
300.0000 mg | ORAL_SOLUTION | Freq: Once | ORAL | Status: AC
  Administered 2014-12-21: 300 mg via ORAL
  Filled 2014-12-21 (×2): qty 20

## 2014-12-21 MED ORDER — ALUM & MAG HYDROXIDE-SIMETH 200-200-20 MG/5ML PO SUSP
30.0000 mL | Freq: Once | ORAL | Status: AC
  Administered 2014-12-21: 30 mL via ORAL
  Filled 2014-12-21: qty 30

## 2014-12-21 MED ORDER — HYDROCODONE-ACETAMINOPHEN 5-325 MG PO TABS
1.0000 | ORAL_TABLET | Freq: Once | ORAL | Status: AC
  Administered 2014-12-21: 1 via ORAL
  Filled 2014-12-21: qty 1

## 2014-12-21 MED ORDER — METOCLOPRAMIDE HCL 10 MG PO TABS
10.0000 mg | ORAL_TABLET | Freq: Once | ORAL | Status: AC
  Administered 2014-12-21: 10 mg via ORAL
  Filled 2014-12-21: qty 1

## 2014-12-21 MED ORDER — MORPHINE SULFATE (PF) 4 MG/ML IV SOLN
4.0000 mg | Freq: Once | INTRAVENOUS | Status: DC
  Filled 2014-12-21: qty 1

## 2014-12-21 MED ORDER — SODIUM CHLORIDE 0.9 % IV BOLUS (SEPSIS): 1000.0000 mL | Freq: Once | INTRAVENOUS | Status: DC

## 2014-12-21 NOTE — Telephone Encounter (Signed)
Doc of the Day Please see the 2 ER notes from 12/20/14 on this patient. I need you to advise on scheduling. Can she be seen by PA? My first available doctor opening is 01/12/15.

## 2014-12-21 NOTE — Discharge Instructions (Signed)
Esophagitis Destiny [name], take pain medication as needed, see a GI doctor within 3 days for close follow-up. If any symptoms worsen come back to the emergency department immediately. Thank you. Esophagitis is inflammation of the esophagus. The esophagus is the tube that carries food and liquids from your mouth to your stomach. Esophagitis can cause soreness or pain in the esophagus. This condition can make it difficult and painful to swallow.  CAUSES Most causes of esophagitis are not serious. Common causes of this condition include:  Gastroesophageal reflux disease (GERD). This is when stomach contents move back up into the esophagus (reflux).  Repeated vomiting.  An allergic-type reaction, especially caused by food allergies (eosinophilic esophagitis).  Injury to the esophagus by swallowing large pills with or without water, or swallowing certain types of medicines.  Swallowing (ingesting) harmful chemicals, such as household cleaning products.  Heavy alcohol use.  An infection of the esophagus.This most often occurs in people who have a weakened immune system.  Radiation or chemotherapy treatment for cancer.  Certain diseases such as sarcoidosis, Crohn disease, and scleroderma. SYMPTOMS Symptoms of this condition include:  Difficult or painful swallowing.  Pain with swallowing acidic liquids, such as citrus juices.  Pain with burping.  Chest pain.  Difficulty breathing.  Nausea.  Vomiting.  Pain in the abdomen.  Weight loss.  Ulcers in the mouth.  Patches of white material in the mouth (candidiasis).  Fever.  Coughing up blood or vomiting blood.  Stool that is black, tarry, or bright red. DIAGNOSIS Your health care provider will take a medical history and perform a physical exam. You may also have other tests, including:  An endoscopy to examine your stomach and esophagus with a small camera.  A test that measures the acidity level in your esophagus.  A  test that measures how much pressure is on your esophagus.  A barium swallow or modified barium swallow to show the shape, size, and functioning of your esophagus.  Allergy tests. TREATMENT Treatment for this condition depends on the cause of your esophagitis. In some cases, steroids or other medicines may be given to help relieve your symptoms or to treat the underlying cause of your condition. You may have to make some lifestyle changes, such as:  Avoiding alcohol.  Quitting smoking.  Changing your diet.  Exercising.  Changing your sleep habits and your sleep environment. HOME CARE INSTRUCTIONS Take these actions to decrease your discomfort and to help avoid complications. Diet  Follow a diet as recommended by your health care provider. This may involve avoiding foods and drinks such as:  Coffee and tea (with or without caffeine).  Drinks that contain alcohol.  Energy drinks and sports drinks.  Carbonated drinks or sodas.  Chocolate and cocoa.  Peppermint and mint flavorings.  Garlic and onions.  Horseradish.  Spicy and acidic foods, including peppers, chili powder, curry powder, vinegar, hot sauces, and barbecue sauce.  Citrus fruit juices and citrus fruits, such as oranges, lemons, and limes.  Tomato-based foods, such as red sauce, chili, salsa, and pizza with red sauce.  Fried and fatty foods, such as donuts, french fries, potato chips, and high-fat dressings.  High-fat meats, such as hot dogs and fatty cuts of red and white meats, such as rib eye steak, sausage, ham, and bacon.  High-fat dairy items, such as whole milk, butter, and cream cheese.  Eat small, frequent meals instead of large meals.  Avoid drinking large amounts of liquid with your meals.  Avoid eating meals  during the 2-3 hours before bedtime.  Avoid lying down right after you eat.  Do not exercise right after you eat.  Avoid foods and drinks that seem to make your symptoms  worse. General Instructions  Pay attention to any changes in your symptoms.  Take over-the-counter and prescription medicines only as told by your health care provider. Do not take aspirin, ibuprofen, or other NSAIDs unless your health care provider told you to do so.  If you have trouble taking pills, use a pill splitter to decrease the size of the pill. This will decrease the chance of the pill getting stuck or injuring your esophagus on the way down. Also, drink water after you take a pill.  Do not use any tobacco products, including cigarettes, chewing tobacco, and e-cigarettes. If you need help quitting, ask your health care provider.  Wear loose-fitting clothing. Do not wear anything tight around your waist that causes pressure on your abdomen.  Raise (elevate) the head of your bed about 6 inches (15 cm).  Try to reduce your stress, such as with yoga or meditation. If you need help reducing stress, ask your health care provider.  If you are overweight, reduce your weight to an amount that is healthy for you. Ask your health care provider for guidance about a safe weight loss goal.  Keep all follow-up visits as told by your health care provider. This is important. SEEK MEDICAL CARE IF:  You have new symptoms.  You have unexplained weight loss.  You have difficulty swallowing, or it hurts to swallow.  You have wheezing or a persistent cough.  Your symptoms do not improve with treatment.  You have frequent heartburn for more than two weeks. SEEK IMMEDIATE MEDICAL CARE IF:  You have severe pain in your arms, neck, jaw, teeth, or back.  You feel sweaty, dizzy, or light-headed.  You have chest pain or shortness of breath.  You vomit and your vomit looks like blood or coffee grounds.  Your stool is bloody or black.  You have a fever.  You cannot swallow, drink, or eat.   This information is not intended to replace advice given to you by your health care provider. Make  sure you discuss any questions you have with your health care provider.   Document Released: 04/03/2004 Document Revised: 11/15/2014 Document Reviewed: 06/21/2014 Elsevier Interactive Patient Education [employer].

## 2014-12-21 NOTE — Telephone Encounter (Signed)
Patient can be seen by one of the PAs. Thanks

## 2014-12-26 ENCOUNTER — Encounter: Payer: Self-pay | Admitting: *Deleted

## 2014-12-27 ENCOUNTER — Telehealth: Payer: Self-pay | Admitting: *Deleted

## 2014-12-27 NOTE — Telephone Encounter (Signed)
Apt made for patient on 12-29-14 at 1:30pm with Amy for hospital follow.

## 2014-12-27 NOTE — Telephone Encounter (Signed)
Called patient and message left to call office to schedule hospital follow up apt with a PA.

## 2014-12-27 NOTE — Telephone Encounter (Signed)
Patient called wanting to speak to Norton Blizzard concerning any new information about CT chest/abdomen appointment. Message sent to [facility].

## 2014-12-27 NOTE — Telephone Encounter (Signed)
Pt said she is returning your call [phone]

## 2014-12-28 ENCOUNTER — Telehealth: Payer: Self-pay | Admitting: *Deleted

## 2014-12-28 NOTE — Telephone Encounter (Signed)
Oncology Nurse Navigator Documentation  Oncology Nurse Navigator Flowsheets 12/28/2014  Navigator Encounter Type Telephone/Patient called yesterday and had questions about scan and when that will be ordered.  I spoke with Destiny [name] in [place] and CT Chest was denied by Medicaid.  I called to speak with patient.  Her son [name] answered the phone.  I updated him that scan was denied but will be requested again for approval.  I asked that he update his mom and to call with any questions   Patient Visit Type Follow-up  Treatment Phase Other  Barriers/Navigation Needs -  Interventions Coordination of Care  Coordination of Care Radiology  Support Groups/Services -  Time Spent with Patient 30

## 2014-12-29 ENCOUNTER — Telehealth: Payer: Self-pay | Admitting: Physician Assistant

## 2014-12-29 ENCOUNTER — Ambulatory Visit (INDEPENDENT_AMBULATORY_CARE_PROVIDER_SITE_OTHER): Payer: Medicaid Other | Admitting: Physician Assistant

## 2014-12-29 ENCOUNTER — Encounter: Payer: Self-pay | Admitting: Physician Assistant

## 2014-12-29 VITALS — BP 122/76 | HR 120 | Ht 67.0 in | Wt 194.3 lb

## 2014-12-29 DIAGNOSIS — T66XXXA Radiation sickness, unspecified, initial encounter: Secondary | ICD-10-CM

## 2014-12-29 DIAGNOSIS — R079 Chest pain, unspecified: Secondary | ICD-10-CM | POA: Diagnosis not present

## 2014-12-29 DIAGNOSIS — R1310 Dysphagia, unspecified: Secondary | ICD-10-CM

## 2014-12-29 DIAGNOSIS — K208 Other esophagitis: Secondary | ICD-10-CM

## 2014-12-29 MED ORDER — HYDROCODONE-ACETAMINOPHEN 5-300 MG PO TABS: 1.0000 | ORAL_TABLET | Freq: Four times a day (QID) | ORAL | Status: DC | PRN

## 2014-12-29 MED ORDER — LIDOCAINE VISCOUS 2 % MT SOLN: OROMUCOSAL | Status: DC

## 2014-12-29 NOTE — Progress Notes (Signed)
Patient ID: Destiny [name], female   DOB: 1970-07-07, 44 y.o.   MRN: [id number]   Subjective:    Patient ID: Destiny [name], female    DOB: 02-Nov-1970, 44 y.o.   MRN: [id number]  HPI  Destiny [name] is a pleasant 44 year old African-American female, new to GI today referred after recent ER visit with acute esophagitis. She's currently being followed by Dr. 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 She says she completed radiation to her chest in July 2016. She has been having some chest discomfort, dysphagia and odynophagia ever since that time. She has been consuming a lot of Advil and Aleve which are not helping the pain. She says she felt better when she had a stronger pain medication. She relates a constant dull burning discomfort in the lower mid center of her chest which is currently nonradiating. Any time she eats or drinks anything it increases her pain and either  hot or cold foods also increase her discomfort says she's been having everything room temperature. She has had dysphagia and says that solid foods seem to be hanging up in her esophagus she will have to sip liquids to try to push things down. She has not had any regurgitation. Her appetite has been fair her weight is been stable. She had CT Angio of the chest done in September 2016 which did show mid esophageal wall thickening question post radiation change mass less likely and interval mild increase in right hilar adenopathy. She tells me that she is scheduled for follow-up CT scan of her chest and abdomen next week. She has a prescription for Carafate but says she took this in suspension form for about a month with no change in her symptoms, she is also taking pantoprazole 40 mg by mouth twice daily.  Review of Systems Pertinent positive and negative review of systems were noted in the above HPI section.  All other review of  systems was otherwise negative.  Outpatient Encounter Prescriptions as of 12/29/2014  Medication Sig  . acetaminophen (TYLENOL) 500 MG tablet Take 1,000 mg by mouth every 6 (six) hours as needed (pain).  . clonazePAM (KLONOPIN) 0.5 MG tablet Take 1 tablet (0.5 mg total) by mouth 2 (two) times daily as needed for anxiety.  [name] dexamethasone (DECADRON) 2 MG tablet Take 1 tablet (2 mg total) by mouth daily.  . hydrOXYzine (VISTARIL) 25 MG capsule Take 1 capsule (25 mg total) by mouth 3 (three) times daily as needed for itching or anxiety.  [name] ibuprofen (ADVIL,MOTRIN) 200 MG tablet Take 400 mg by mouth every 6 (six) hours as needed.  [name] lisinopril-hydrochlorothiazide (PRINZIDE,ZESTORETIC) 10-12.5 MG per tablet Take 1 tablet by mouth daily.  . mirtazapine (REMERON) 30 MG tablet Take 30 mg by mouth at bedtime.   . pantoprazole (PROTONIX) 40 MG tablet Take 40 mg by mouth 2 (two) times daily before a meal.   . sucralfate (CARAFATE) 1 GM/10ML suspension Take 10 mLs (1 g total) by mouth 4 (four) times daily -  with meals and at bedtime. (Patient taking differently: Take 1 g by mouth 4 (four) times daily -  with meals and at bedtime. As needed)  . Hydrocodone-Acetaminophen 5-300 MG TABS Take 1 tablet by mouth every 6 (six) hours as needed (pain).  [name] lidocaine (XYLOCAINE) 2 % solution Swallow 5 ml 15 minutes before meals  . [DISCONTINUED] HYDROcodone-acetaminophen (NORCO/VICODIN) 5-325 MG tablet Take 1 tablet  by mouth 2 (two) times daily as needed for severe pain.   No facility-administered encounter medications on file as of 12/29/2014.   No Known Allergies Patient Active Problem List   Diagnosis Date Noted  . HCAP (healthcare-associated pneumonia)   . Odynophagia   . Acute esophagitis   . Dehydration   . CAP (community acquired pneumonia) 09/08/2014  . Sinus tachycardia ([place]) 09/08/2014  . Cough   . Shingles 09/06/2014  . Encounter for antineoplastic chemotherapy 08/28/2014  . Esophagitis 08/21/2014   . Extremity edema 08/08/2014  . Chronic pain 07/10/2014  . Brain metastases ([place]) 06/18/2014  . Lung nodules   . Primary cancer of right upper lobe of lung ([place])   . Poor dentition   . Hyperglycemia 06/11/2014  . Cigarette smoker 06/11/2014  . GERD (gastroesophageal reflux disease) 06/10/2014  . Hypertension 06/10/2014  . Depression   . Paroxysmal supraventricular tachycardia ([place]) 01/16/2014  . SVT (supraventricular tachycardia) ([place]) 01/16/2014   Social History   Social History  . Marital Status: Married    Spouse Name: N/A  . Number of Children: 4  . Years of Education: N/A   Occupational History  . Not on file.   Social History Main Topics  . Smoking status: Current Every Day Smoker -- 1.00 packs/day for 30 years    Types: Cigarettes    Last Attempt to Quit: 06/13/2014  . Smokeless tobacco: Not on file     Comment: form given 12/29/14  . 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 Drug Use: No  . Sexual Activity: Yes   Other Topics Concern  . Not on file   Social History Narrative    Ms. Halling's family history includes Colon cancer in her father; Diabetes in her maternal grandmother and sister; Hypertension in her father; Seizures in her mother; Thyroid cancer in her sister. There is no history of Kidney disease, Liver disease, Stomach cancer, or Esophageal cancer.      Objective:    Filed Vitals:   12/29/14 1340  BP: 122/76  Pulse: 120    Physical Exam  well-developed African-American female in no acute distress, accompanied by her son, blood pressure 122/76 pulse 120 height 5 foot 7 weight 194, BMI 30.4. HEENT ;nontraumatic normocephalic EOMI PERRLA sclera anicteric, Cardiovascular ;regular rate and rhythm with S1-S2 no murmur or gallop, Pulmonary; clear bilaterally, Abdomen; soft bowel sounds are presentclean nontender there is no palpable mass or hepatosplenomegaly, Rectal ;exam not done, Extremities; no clubbing cyanosis or  edema skin warm and dry, Neuropsych ;mood and affect appropriate       Assessment & Plan:   #1 44 yo female with stage IV  Adenocarcinoma of the lung with brain mets- completed chest radiation 09/2014 #2 persistent dysphagia/odynophagia/central chest pain x 3 months- consistent with radiation esophagitis/stricture cannot  Malignant involvement #3 hx SVT  #4 hx depression  Plan; Continue Protonix 40 mg po BID Add trail of viscous lidocaine 2%- 5 cc swallow 15 min ac meals  Vicodin 5/300 q 6 hours prn pain #40/0 Schedule for EGD with possible dilation with Dr Ardis Hughs- procedure discussed in detail with pt and she is agreeable to proceed. Will need to follow up on Ct scans scheduled for next week by ONC as well.   Micholas Drumwright S Bhavik Cabiness PA-C 12/29/2014   Cc: Barrie Lyme, FNP

## 2014-12-29 NOTE — Telephone Encounter (Signed)
The patient saw Nicoletta Ba PA in the office today at 1:30 PM . Got new RX for Vicodin.

## 2014-12-29 NOTE — Patient Instructions (Signed)
You have been scheduled for an endoscopy. Please follow written instructions given to you at your visit today. If you use inhalers (even only as needed), please bring them with you on the day of your procedure. Your physician has requested that you go to www.startemmi.com and enter the access code given to you at your visit today. This web site gives a general overview about your procedure. However, you should still follow specific instructions given to you by our office regarding your preparation for the procedure.  We have sent the following medications to your pharmacy for you to pick up at your convenience: Lidocaine-5 ml 15 minutes prior to meals  Continue Protonix 40 mg twice daily  We have given you a prescription of Vicodin to take to your pharmacy.  Please discontinue Advil/Motrin etc.

## 2015-01-01 ENCOUNTER — Other Ambulatory Visit: Payer: Medicaid Other

## 2015-01-01 ENCOUNTER — Encounter (HOSPITAL_COMMUNITY): Payer: Self-pay

## 2015-01-01 ENCOUNTER — Ambulatory Visit (HOSPITAL_COMMUNITY)
Admission: RE | Admit: 2015-01-01 | Discharge: 2015-01-01 | Disposition: A | Discharge status: Home / Self Care | Payer: Medicaid Other | Source: Ambulatory Visit | Attending: Hematology | Admitting: Hematology

## 2015-01-01 ENCOUNTER — Ambulatory Visit (HOSPITAL_COMMUNITY): Payer: Medicaid Other

## 2015-01-01 DIAGNOSIS — D259 Leiomyoma of uterus, unspecified: Secondary | ICD-10-CM | POA: Diagnosis not present

## 2015-01-01 DIAGNOSIS — R911 Solitary pulmonary nodule: Secondary | ICD-10-CM | POA: Insufficient documentation

## 2015-01-01 DIAGNOSIS — Z923 Personal history of irradiation: Secondary | ICD-10-CM | POA: Insufficient documentation

## 2015-01-01 DIAGNOSIS — K229 Disease of esophagus, unspecified: Secondary | ICD-10-CM | POA: Diagnosis not present

## 2015-01-01 DIAGNOSIS — C3411 Malignant neoplasm of upper lobe, right bronchus or lung: Secondary | ICD-10-CM | POA: Insufficient documentation

## 2015-01-01 MED ORDER — IOHEXOL 300 MG/ML  SOLN
100.0000 mL | Freq: Once | INTRAMUSCULAR | Status: AC | PRN
  Administered 2015-01-01: 100 mL via INTRAVENOUS

## 2015-01-01 NOTE — Progress Notes (Signed)
i agree with the above note, plan 

## 2015-01-02 ENCOUNTER — Encounter: Payer: Self-pay | Admitting: Gastroenterology

## 2015-01-02 ENCOUNTER — Ambulatory Visit (AMBULATORY_SURGERY_CENTER): Payer: Medicaid Other | Admitting: Gastroenterology

## 2015-01-02 VITALS — BP 129/72 | HR 77 | Temp 96.5°F | Resp 29 | Ht 67.0 in | Wt 194.0 lb

## 2015-01-02 DIAGNOSIS — K2100 Gastro-esophageal reflux disease with esophagitis, without bleeding: Secondary | ICD-10-CM

## 2015-01-02 DIAGNOSIS — K2210 Ulcer of esophagus without bleeding: Secondary | ICD-10-CM

## 2015-01-02 DIAGNOSIS — K222 Esophageal obstruction: Secondary | ICD-10-CM | POA: Diagnosis not present

## 2015-01-02 DIAGNOSIS — R1310 Dysphagia, unspecified: Secondary | ICD-10-CM

## 2015-01-02 MED ORDER — SODIUM CHLORIDE 0.9 % IV SOLN: 500.0000 mL | INTRAVENOUS | Status: DC

## 2015-01-02 NOTE — Op Note (Signed)
[facility]  [address]. [place], [id number]   ENDOSCOPY PROCEDURE REPORT  PATIENT: Destiny, [name]  MR#: [id number] BIRTHDATE: 27-Nov-1970 , 68  yrs. old GENDER: female ENDOSCOPIST: Milus Banister, MD PROCEDURE DATE:  01/02/2015 PROCEDURE:  EGD w/ balloon dilation and EGD w/ biopsy ASA CLASS:     Class II INDICATIONS:  dysphagia, odynophagia, known Stage IV lung adeno with mets to brain, has had chest radiation; GI symptoms started after radiation.[name] MEDICATIONS: Monitored anesthesia care and Propofol 300 mg IV TOPICAL ANESTHETIC: none  DESCRIPTION OF PROCEDURE: After the risks benefits and alternatives of the procedure were thoroughly explained, informed consent was obtained.  The LB [id number] endoscope was introduced through the mouth and advanced to the second portion of the duodenum , Without limitations.  The instrument was slowly withdrawn as the mucosa was fully examined.  There was a mid esophageal ulcer surrounded by slightly inflamed appearing mucosa.  The ulcer was 1cm across, clean based and did not cause stricture.  The base and edge of the ulcer was biopsied and sent to pathlogy.  There was LA Grade A GE junction esophagitis and a focal peptic stricture.  The stricture was dilated with a CRE TTS 8m balloon.  The examination was otherwise normal. Retroflexed views revealed no abnormalities.     The scope was then withdrawn from the patient and the procedure completed. COMPLICATIONS: There were no immediate complications.  ENDOSCOPIC IMPRESSION: There was a mid esophageal ulcer surrounded by slightly inflamed appearing mucosa.  The ulcer was 1cm across, clean based and did not cause stricture.  The base and edge of the ulcer was biopsied and sent to pathlogy.  There was LA Grade A GE junction esophagitis and a focal peptic stricture.  The stricture was dilated with a CRE TTS 268mballoon.  The examination was otherwise  normal  RECOMMENDATIONS: Await final pathlogy from mid esophageal ulcer, I suspect radiation induced but viral causes are also possible.  Continue taking your protonix 1 pill 20-30 min prior to breakfast and dinner meals. Chew your food well, take small bites, eat slowly.   eSigned:  DaMilus BanisterMD 01/02/2015 8:27 AM    [facility]

## 2015-01-02 NOTE — Progress Notes (Signed)
Transferred to recovery room. A/O x3, pleased with MAC.  VSS.  Report to Jane, RN. 

## 2015-01-02 NOTE — Progress Notes (Signed)
Called to room to assist during endoscopic procedure.  Patient ID and intended procedure confirmed with present staff. Received instructions for my participation in the procedure from the performing physician.  

## 2015-01-02 NOTE — Patient Instructions (Signed)
YOU HAD AN ENDOSCOPIC PROCEDURE TODAY AT [facility]:   Refer to the procedure report that was given to you for any specific questions about what was found during the examination.  If the procedure report does not answer your questions, please call your gastroenterologist to clarify.  If you requested that your care partner not be given the details of your procedure findings, then the procedure report has been included in a sealed envelope for you to review at your convenience later.  YOU SHOULD EXPECT: Some feelings of bloating in the abdomen. Passage of more gas than usual.  Walking can help get rid of the air that was put into your GI tract during the procedure and reduce the bloating. If you had a lower endoscopy (such as a colonoscopy or flexible sigmoidoscopy) you may notice spotting of blood in your stool or on the toilet paper. If you underwent a bowel prep for your procedure, you may not have a normal bowel movement for a few days.  Please Note:  You might notice some irritation and congestion in your nose or some drainage.  This is from the oxygen used during your procedure.  There is no need for concern and it should clear up in a day or so.  SYMPTOMS TO REPORT IMMEDIATELY:    Following upper endoscopy (EGD)  Vomiting of blood or coffee ground material  New chest pain or pain under the shoulder blades  Painful or persistently difficult swallowing  New shortness of breath  Fever of 100F or higher  Black, tarry-looking stools  For urgent or emergent issues, a gastroenterologist can be reached at any hour by calling [phone].   DIET: Your first meal following the procedure should be a small meal and then it is ok to progress to your normal diet. Heavy or fried foods are harder to digest and may make you feel nauseous or bloated.  Likewise, meals heavy in dairy and vegetables can increase bloating.  Drink plenty of fluids but you should avoid alcoholic beverages  for 24 hours.  ACTIVITY:  You should plan to take it easy for the rest of today and you should NOT DRIVE or use heavy machinery until tomorrow (because of the sedation medicines used during the test).    FOLLOW UP: Our staff will call the number listed on your records the next business day following your procedure to check on you and address any questions or concerns that you may have regarding the information given to you following your procedure. If we do not reach you, we will leave a message.  However, if you are feeling well and you are not experiencing any problems, there is no need to return our call.  We will assume that you have returned to your regular daily activities without incident.  If any biopsies were taken you will be contacted by phone or by letter within the next 1-3 weeks.  Please call us at [phone] if you have not heard about the biopsies in 3 weeks.    SIGNATURES/CONFIDENTIALITY: You and/or your care partner have signed paperwork which will be entered into your electronic medical record.  These signatures attest to the fact that that the information above on your After Visit Summary has been reviewed and is understood.  Full responsibility of the confidentiality of this discharge information lies with you and/or your care-partner.  Stricture and esophagitis information given.  Eat slowly and chew food thoroughly.  Post dilation diet explained

## 2015-01-03 ENCOUNTER — Ambulatory Visit: Payer: Self-pay | Admitting: Radiation Oncology

## 2015-01-03 ENCOUNTER — Telehealth: Payer: Self-pay | Admitting: *Deleted

## 2015-01-03 ENCOUNTER — Other Ambulatory Visit (HOSPITAL_BASED_OUTPATIENT_CLINIC_OR_DEPARTMENT_OTHER): Payer: Medicaid Other

## 2015-01-03 ENCOUNTER — Ambulatory Visit: Payer: Medicaid Other | Admitting: Hematology

## 2015-01-03 DIAGNOSIS — C3411 Malignant neoplasm of upper lobe, right bronchus or lung: Secondary | ICD-10-CM

## 2015-01-03 LAB — COMPREHENSIVE METABOLIC PANEL (CC13)
ALBUMIN: 3.5 g/dL (ref 3.5–5.0)
ALK PHOS: 84 U/L (ref 40–150)
ALT: 17 U/L (ref 0–55)
AST: 15 U/L (ref 5–34)
Anion Gap: 9 mEq/L (ref 3–11)
BUN: 8.5 mg/dL (ref 7.0–26.0)
CALCIUM: 9.4 mg/dL (ref 8.4–10.4)
CHLORIDE: 109 meq/L (ref 98–109)
CO2: 21 mEq/L — ABNORMAL LOW (ref 22–29)
Creatinine: 0.7 mg/dL (ref 0.6–1.1)
Glucose: 101 mg/dl (ref 70–140)
POTASSIUM: 3.7 meq/L (ref 3.5–5.1)
SODIUM: 140 meq/L (ref 136–145)
Total Bilirubin: <0.3 mg/dL (ref 0.20–1.20)
Total Protein: 7.2 g/dL (ref 6.4–8.3)

## 2015-01-03 LAB — CBC WITH DIFFERENTIAL/PLATELET
BASO%: 0 % (ref 0.0–2.0)
BASOS ABS: 0 10*3/uL (ref 0.0–0.1)
EOS ABS: 0 10*3/uL (ref 0.0–0.5)
EOS%: 0 % (ref 0.0–7.0)
HCT: 36.8 % (ref 34.8–46.6)
HEMOGLOBIN: 12.5 g/dL (ref 11.6–15.9)
LYMPH%: 12.1 % — ABNORMAL LOW (ref 14.0–49.7)
MCH: 29.6 pg (ref 25.1–34.0)
MCHC: 34 g/dL (ref 31.5–36.0)
MCV: 87.2 fL (ref 79.5–101.0)
MONO#: 0.7 10*3/uL (ref 0.1–0.9)
MONO%: 8.1 % (ref 0.0–14.0)
NEUT#: 6.5 10*3/uL (ref 1.5–6.5)
NEUT%: 79.8 % — AB (ref 38.4–76.8)
Platelets: 258 10*3/uL (ref 145–400)
RBC: 4.22 10*6/uL (ref 3.70–5.45)
RDW: 13.4 % (ref 11.2–14.5)
WBC: 8.2 10*3/uL (ref 3.9–10.3)
lymph#: 1 10*3/uL (ref 0.9–3.3)

## 2015-01-03 NOTE — Telephone Encounter (Signed)
  Follow up Call-  Call back number 01/02/2015  Post procedure Call Back phone  # [phone]  Permission to leave phone message Yes     Patient questions:  Do you have a fever, pain , or abdominal swelling? No. Pain Score  0 *  Have you tolerated food without any problems? Yes.    Have you been able to return to your normal activities? Yes.    Do you have any questions about your discharge instructions: Diet   No. Medications  No. Follow up visit  No.  Do you have questions or concerns about your Care? No.  Actions: * If pain score is 4 or above: No action needed, pain <4.

## 2015-01-05 ENCOUNTER — Telehealth: Payer: Self-pay | Admitting: Radiation Oncology

## 2015-01-05 NOTE — Telephone Encounter (Signed)
Per Dr. Johny Shears order called in Ativan 1 mg tid prn anxiety and thirty minutes prior to MRI to [facility]. Spoke with Bland Span at [employer]. Then, phoned patient making her aware this was done.

## 2015-01-11 ENCOUNTER — Telehealth: Payer: Self-pay | Admitting: Hematology and Oncology

## 2015-01-11 ENCOUNTER — Other Ambulatory Visit: Payer: Self-pay | Admitting: Gastroenterology

## 2015-01-11 ENCOUNTER — Telehealth: Payer: Self-pay | Admitting: *Deleted

## 2015-01-11 MED ORDER — HYDROCODONE-ACETAMINOPHEN 5-300 MG PO TABS: 1.0000 | ORAL_TABLET | Freq: Four times a day (QID) | ORAL | Status: DC | PRN

## 2015-01-11 NOTE — Telephone Encounter (Signed)
Please advise on refill.

## 2015-01-11 NOTE — Telephone Encounter (Signed)
This RN was informed by Norton Blizzard, RN that pt was being transferred to the care of Dr. [name].  Per Lewis Moccasin in [facility] to schedule new pt apt with Dr. [name].  Dr. [name] no longer following pt.

## 2015-01-11 NOTE — Telephone Encounter (Signed)
Pt called and told prescription at the front desk for pick up

## 2015-01-11 NOTE — Telephone Encounter (Signed)
Next F/U scheduled with Dr. [name] on 01-22-2015.

## 2015-01-11 NOTE — Telephone Encounter (Signed)
"  I need to know when I need to see Dr. [name].  I've seen GI, had endoscopy and was told I either have an ulcer from the effects of radiation or an infection.  I also have two hydrocodone pain pills left and need a refill." Will notify Dr. [name], return number [phone].  This nurse advised she contact [facility] for refill.

## 2015-01-11 NOTE — Telephone Encounter (Signed)
S/W PATIENT AND GAVE NP APPT FOR 11/14 @ 1 W/DR. GUDENA. PATIENT CONFIRMED APPT

## 2015-01-11 NOTE — Telephone Encounter (Signed)
Ok   .. #30 /0 refills .[name] After this no more from GI- perhaps her Oncologist will prescribe

## 2015-01-12 ENCOUNTER — Telehealth: Payer: Self-pay | Admitting: Gastroenterology

## 2015-01-12 ENCOUNTER — Ambulatory Visit
Admit: 2015-01-12 | Discharge: 2015-01-12 | Disposition: A | Discharge status: Home / Self Care | Payer: Medicaid Other | Attending: Radiation Oncology | Admitting: Radiation Oncology

## 2015-01-12 DIAGNOSIS — C7931 Secondary malignant neoplasm of brain: Secondary | ICD-10-CM

## 2015-01-12 MED ORDER — GADOBENATE DIMEGLUMINE 529 MG/ML IV SOLN
18.0000 mL | Freq: Once | INTRAVENOUS | Status: AC | PRN
  Administered 2015-01-12: 18 mL via INTRAVENOUS

## 2015-01-12 NOTE — Telephone Encounter (Signed)
I will be following her.  The biopsies are not back yet from the EGD.  Will contact her when they are.

## 2015-01-12 NOTE — Telephone Encounter (Signed)
Dr. Ardis Hughs pt is asking who should follow her for the ulcer that was caused by radiation. Please advise.

## 2015-01-12 NOTE — Telephone Encounter (Signed)
Left message for pt to call back  °

## 2015-01-14 NOTE — Progress Notes (Signed)
Quick Note:  Susan, please set-up SRS ______ 

## 2015-01-15 ENCOUNTER — Ambulatory Visit
Admit: 2015-01-15 | Discharge: 2015-01-15 | Disposition: A | Discharge status: Home / Self Care | Payer: Medicaid Other | Attending: Radiation Oncology | Admitting: Radiation Oncology

## 2015-01-15 ENCOUNTER — Ambulatory Visit (HOSPITAL_BASED_OUTPATIENT_CLINIC_OR_DEPARTMENT_OTHER)
Admission: RE | Admit: 2015-01-15 | Discharge: 2015-01-15 | Disposition: A | Discharge status: Home / Self Care | Payer: Medicaid Other | Source: Ambulatory Visit | Attending: Internal Medicine | Admitting: Internal Medicine

## 2015-01-15 ENCOUNTER — Encounter: Payer: Self-pay | Admitting: Radiation Oncology

## 2015-01-15 VITALS — BP 128/74 | HR 95 | Resp 16 | Wt 193.8 lb

## 2015-01-15 DIAGNOSIS — Z559 Problems related to education and literacy, unspecified: Secondary | ICD-10-CM | POA: Diagnosis not present

## 2015-01-15 DIAGNOSIS — C7931 Secondary malignant neoplasm of brain: Secondary | ICD-10-CM

## 2015-01-15 DIAGNOSIS — Z515 Encounter for palliative care: Secondary | ICD-10-CM

## 2015-01-15 NOTE — Progress Notes (Signed)
Radiation Oncology         [phone] ________________________________  Name: Destiny [name] MRN: [id number]  Date: 01/15/2015  DOB: 1971/03/04  Follow-Up Visit Note  CC: [facility]  Truitt Merle, MD     Diagnosis: Stage IV lung cancer based on a treated brain metastasis and local stage III disease     ICD-9-CM ICD-10-CM   1. Brain metastases (HCC) 198.3 C79.31     Interval Since Last Radiation:  5 months [07/17/2014-09/01/2014]  Narrative:  The patient returns today for routine follow-up with radiation oncology. Weight and vitals stable. Denies pain. Reports difficulty remembering to take her decadron 2 mg once per day as directed. No thrush noted. All family members and patients concerned about decline of patient's short term memory. Denies headache, dizziness, nausea, vomiting, diplopia or ringing in the ears. Requesting a prescription for a different smoking cessation patch.                             ALLERGIES:  has No Known Allergies.  Meds: Current Outpatient Prescriptions  Medication Sig Dispense Refill  . acetaminophen (TYLENOL) 500 MG tablet Take 1,000 mg by mouth every 6 (six) hours as needed (pain).    . clonazePAM (KLONOPIN) 0.5 MG tablet Take 1 tablet (0.5 mg total) by mouth 2 (two) times daily as needed for anxiety. 60 tablet 0  . dexamethasone (DECADRON) 2 MG tablet Take 1 tablet (2 mg total) by mouth daily. 30 tablet 5  . Hydrocodone-Acetaminophen 5-300 MG TABS Take 1 tablet by mouth every 6 (six) hours as needed (pain). 30 each 0  . ibuprofen (ADVIL,MOTRIN) 200 MG tablet Take 400 mg by mouth every 6 (six) hours as needed.    [name] lisinopril-hydrochlorothiazide (PRINZIDE,ZESTORETIC) 10-12.5 MG per tablet Take 1 tablet by mouth daily.  0  . LORazepam (ATIVAN) 1 MG tablet take 1 tablet by mouth three times a day if needed for anxiety or 1 TABLET 30 MINUTES BEFORE MRI  0  . mirtazapine (REMERON) 30 MG tablet Take 30 mg by mouth at bedtime.    0  . pantoprazole (PROTONIX) 40 MG tablet Take 40 mg by mouth 2 (two) times daily before a meal.     . hydrOXYzine (VISTARIL) 25 MG capsule Take 1 capsule (25 mg total) by mouth 3 (three) times daily as needed for itching or anxiety. (Patient not taking: Reported on 01/02/2015) 60 capsule 0  . lidocaine (XYLOCAINE) 2 % solution Swallow 5 ml 15 minutes before meals (Patient not taking: Reported on 01/02/2015) 450 mL 0  . sucralfate (CARAFATE) 1 GM/10ML suspension Take 10 mLs (1 g total) by mouth 4 (four) times daily -  with meals and at bedtime. (Patient not taking: Reported on 01/02/2015) 420 mL 1   No current facility-administered medications for this encounter.    Physical Findings: The patient is in no acute distress and the patient is alert and oriented x3.  weight is 193 lb 12.6 oz (87.9 kg). Her blood pressure is 128/74 and her pulse is 95. Her respiration is 16 and oxygen saturation is 100%. . The patient has no significant changes to the status of her overall health to be noted at this time.  Lab Findings: Lab Results  Component Value Date   WBC 8.2 01/03/2015   WBC 6.8 12/20/2014   HGB 12.5 01/03/2015   HGB 14.1 12/20/2014   HCT 36.8 01/03/2015   HCT 41.1  12/20/2014   PLT 258 01/03/2015   PLT 256 12/20/2014    Lab Results  Component Value Date   NA 140 01/03/2015   NA 135 12/20/2014   K 3.7 01/03/2015   K 4.0 12/20/2014   CHLORIDE 109 01/03/2015   CO2 21* 01/03/2015   CO2 27 12/20/2014   GLUCOSE 101 01/03/2015   GLUCOSE 101* 12/20/2014   BUN 8.5 01/03/2015   BUN 13 12/20/2014   CREATININE 0.7 01/03/2015   CREATININE 0.82 12/20/2014   BILITOT <0.30 01/03/2015   BILITOT 0.3 12/20/2014   ALKPHOS 84 01/03/2015   ALKPHOS 79 12/20/2014   AST 15 01/03/2015   AST 24 12/20/2014   ALT 17 01/03/2015   ALT 28 12/20/2014   PROT 7.2 01/03/2015   PROT 7.3 12/20/2014   ALBUMIN 3.5 01/03/2015   ALBUMIN 3.6 12/20/2014   CALCIUM 9.4 01/03/2015   CALCIUM 10.8* 12/20/2014    ANIONGAP 9 01/03/2015   ANIONGAP 10 12/20/2014    Radiographic Findings: Dg Chest 2 View  12/20/2014  CLINICAL DATA:  Pain.  Radiation treatment . EXAM: CHEST  2 VIEW COMPARISON:  CT 11/12/2014.  Chest x-ray 09/08/2014 . FINDINGS: Mediastinum and hilar structures are normal. Persistent increased densities in the right upper lobe. Findings could represent mild residual tumor, mild infection, and/or postradiation change . Similar findings noted on prior exam P No pleural effusion or pneumothorax. Heart size normal. No acute bony abnormality . IMPRESSION: Persistent small densities in the right upper lobe. Similar findings noted on prior exam. Findings could represent mild residual tumor, mild infection, and/or postradiation change. Electronically Signed   By: Marcello Moores  Register   On: 12/20/2014 10:41   Ct Chest W Contrast  01/01/2015  CLINICAL DATA:  Restaging for lung carcinoma, initially diagnosed in April 2016. Patient completed chemotherapy in June 2016. No current complaints. EXAM: CT CHEST, ABDOMEN, AND PELVIS WITH CONTRAST TECHNIQUE: Multidetector CT imaging of the chest, abdomen and pelvis was performed following the standard protocol during bolus administration of intravenous contrast. CONTRAST:  144m OMNIPAQUE IOHEXOL 300 MG/ML  SOLN COMPARISON:  Multiple prior chest CTs, oldest dated 06/12/2014 and most recent dated 11/12/2014. PET-CT, 06/23/2014. FINDINGS: CT CHEST FINDINGS Thoracic inlet: No mass or adenopathy. Visualized thyroid is unremarkable. Mediastinum/Nodes: Focal opacity in the anterior mediastinum, anterior to the origin of the aortic arch vessels, measures approximately 16 x 13 mm, without change from the prior studies. This is most likely chronic anterior mediastinal adenopathy. No other mediastinal masses. No evidence of a hilar mass or adenopathy. Heart is normal in size and configuration. There are mild coronary artery calcifications. Great vessels are normal in caliber cracked.  There is mild diffuse wall thickening of the esophagus similar to the prior study. Lungs/Pleura: There are patchy areas of airspace opacity in the right lung, seen peripherally. Most are similar to the prior study. There is a focal area of opacity in the posterior medial right upper lobe centered on image 17, series 4, measuring approximately 12 x 10 mm. Near this same axial level is a pleural-based opacity measuring 18 x 8 mm. Posterior to this is a more ill-defined peripheral area of opacity in the right upper lobe. There is emphysematous cystic change, mild architectural distortion and irregular opacity in the right upper lobe anteriorly and laterally, centered on image 18. Associated with this is a small residual nodule measuring 7 x 5 mm, measuring 9 x 7 mm on the most recent prior exam. No other discrete nodules. There is ill-defined opacity  along the oblique fissure at the level of the right upper lobe bronchus origin. These findings are stable. In the anterior right upper lobe, image 20, there is a pleural-based area of opacity has increased from the prior exam, now measuring approximately 16 mm x 6 mm, previously 6 mm in greatest dimension. There are similar appearing pleural based areas of opacity now evident in the superior segment of the right lower lobe, both centered on image 22. Left lung is clear. Mild changes of centrilobular emphysema are stable. No pleural effusion. Chest wall:  No masses.  No axillary adenopathy. CT ABDOMEN PELVIS FINDINGS Hepatobiliary: Normal Pancreas: Normal Spleen: Normal Adrenals/Urinary Tract: Normal Stomach/Bowel: Unremarkable. Vascular/Lymphatic: No adenopathy. Mild atherosclerotic calcifications noted along a normal caliber abdominal aorta and the iliac arteries. Reproductive: There are uterine masses consistent with fibroids. An area of mixed attenuation with some increased density is noted along the posterior margin of the right ovary with a cystic area more  anteriorly. This is stable from the PET-CT from April 2016. There is a similar appearance of the left ovary, also stable. Other: No ascites. MUSCULOSKELETAL FINDINGS No osteoblastic or osteolytic lesions. IMPRESSION: 1. Since prior study, the small right upper lobe nodule has further decreased in size, now measuring 7 x 5 mm. This nodule lies in the location of the 2 contiguous larger nodules seen in the right upper lobe on the initial exam from April 2016. No other discrete lung nodules. 2. The areas of focal, peripheral, ill-defined opacity noted in the right lung on the most recent prior study are either stable or have mildly increased. Several areas are new. These are most likely due to radiation/chemotherapy induced inflammation. 3. Focal area of anterior mediastinal soft tissue, likely a mildly enlarged lymph node, is stable. No other evidence of mediastinal adenopathy. No hilar masses or adenopathy. 4. Wall thickening of the esophagus is similar to the prior study. 5. No evidence of metastatic disease below the diaphragm. 6. Uterine fibroids. Mild prominence of both ovaries with mixed attenuation unchanged from the prior PET-CT. This is likely physiologic. 7. No evidence of skeletal metastatic disease. Electronically Signed   By: Lajean Manes M.D.   On: 01/01/2015 15:22   Mr Jeri Cos WU Contrast  01/12/2015  CLINICAL DATA:  [facility] protocol. Six-month follow-up. Symptoms of occasional dizziness. EXAM: MRI HEAD WITHOUT AND WITH CONTRAST TECHNIQUE: Multiplanar, multiecho pulse sequences of the brain and surrounding structures were obtained without and with intravenous contrast. CONTRAST:  51m MULTIHANCE GADOBENATE DIMEGLUMINE 529 MG/ML IV SOLN COMPARISON:  Multiple priors, most recent 10/04/2014. FINDINGS: Interval worsening/development of a LEFT cerebellar lesion, posterior medial cortex adjacent to the inferior vermis as seen on image 33 series 10. This measures 9 x 8 mm and has mild surrounding edema.  Previously the lesion was indistinguishable on the 10/04/2014 scan from a vessel due to its small size, only 1 mm in diameter. Slight interval growth of LEFT inferior temporal lobe lesion, now measuring 10 x 9 mm cross-section as compared with 9 x 8 mm previously. Mild surrounding edema also noted. No features suggestive of post treatment effect. Interval improvement of the previous RIGHT parietal superficial lesion, previously 11 x 10 mm, now only 2 mm. No restricted diffusion, acute hemorrhage, or other mass lesions. No hydrocephalus or extra-axial fluid. No midline abnormality. No osseous lesions. Extracranial soft tissues grossly unremarkable. IMPRESSION: Slight interval regrowth of the previously improved LEFT inferior temporal lesion. Favorable response to treatment RIGHT parietal lobe lesion. New 9 x 8  mm LEFT cerebellar lesion. Electronically Signed   By: Staci Righter M.D.   On: 01/12/2015 13:37   Ct Abdomen Pelvis W Contrast  01/01/2015  CLINICAL DATA:  Restaging for lung carcinoma, initially diagnosed in April 2016. Patient completed chemotherapy in June 2016. No current complaints. EXAM: CT CHEST, ABDOMEN, AND PELVIS WITH CONTRAST TECHNIQUE: Multidetector CT imaging of the chest, abdomen and pelvis was performed following the standard protocol during bolus administration of intravenous contrast. CONTRAST:  123m OMNIPAQUE IOHEXOL 300 MG/ML  SOLN COMPARISON:  Multiple prior chest CTs, oldest dated 06/12/2014 and most recent dated 11/12/2014. PET-CT, 06/23/2014. FINDINGS: CT CHEST FINDINGS Thoracic inlet: No mass or adenopathy. Visualized thyroid is unremarkable. Mediastinum/Nodes: Focal opacity in the anterior mediastinum, anterior to the origin of the aortic arch vessels, measures approximately 16 x 13 mm, without change from the prior studies. This is most likely chronic anterior mediastinal adenopathy. No other mediastinal masses. No evidence of a hilar mass or adenopathy. Heart is normal in size  and configuration. There are mild coronary artery calcifications. Great vessels are normal in caliber cracked. There is mild diffuse wall thickening of the esophagus similar to the prior study. Lungs/Pleura: There are patchy areas of airspace opacity in the right lung, seen peripherally. Most are similar to the prior study. There is a focal area of opacity in the posterior medial right upper lobe centered on image 17, series 4, measuring approximately 12 x 10 mm. Near this same axial level is a pleural-based opacity measuring 18 x 8 mm. Posterior to this is a more ill-defined peripheral area of opacity in the right upper lobe. There is emphysematous cystic change, mild architectural distortion and irregular opacity in the right upper lobe anteriorly and laterally, centered on image 18. Associated with this is a small residual nodule measuring 7 x 5 mm, measuring 9 x 7 mm on the most recent prior exam. No other discrete nodules. There is ill-defined opacity along the oblique fissure at the level of the right upper lobe bronchus origin. These findings are stable. In the anterior right upper lobe, image 20, there is a pleural-based area of opacity has increased from the prior exam, now measuring approximately 16 mm x 6 mm, previously 6 mm in greatest dimension. There are similar appearing pleural based areas of opacity now evident in the superior segment of the right lower lobe, both centered on image 22. Left lung is clear. Mild changes of centrilobular emphysema are stable. No pleural effusion. Chest wall:  No masses.  No axillary adenopathy. CT ABDOMEN PELVIS FINDINGS Hepatobiliary: Normal Pancreas: Normal Spleen: Normal Adrenals/Urinary Tract: Normal Stomach/Bowel: Unremarkable. Vascular/Lymphatic: No adenopathy. Mild atherosclerotic calcifications noted along a normal caliber abdominal aorta and the iliac arteries. Reproductive: There are uterine masses consistent with fibroids. An area of mixed attenuation with  some increased density is noted along the posterior margin of the right ovary with a cystic area more anteriorly. This is stable from the PET-CT from April 2016. There is a similar appearance of the left ovary, also stable. Other: No ascites. MUSCULOSKELETAL FINDINGS No osteoblastic or osteolytic lesions. IMPRESSION: 1. Since prior study, the small right upper lobe nodule has further decreased in size, now measuring 7 x 5 mm. This nodule lies in the location of the 2 contiguous larger nodules seen in the right upper lobe on the initial exam from April 2016. No other discrete lung nodules. 2. The areas of focal, peripheral, ill-defined opacity noted in the right lung on the most recent prior  study are either stable or have mildly increased. Several areas are new. These are most likely due to radiation/chemotherapy induced inflammation. 3. Focal area of anterior mediastinal soft tissue, likely a mildly enlarged lymph node, is stable. No other evidence of mediastinal adenopathy. No hilar masses or adenopathy. 4. Wall thickening of the esophagus is similar to the prior study. 5. No evidence of metastatic disease below the diaphragm. 6. Uterine fibroids. Mild prominence of both ovaries with mixed attenuation unchanged from the prior PET-CT. This is likely physiologic. 7. No evidence of skeletal metastatic disease. Electronically Signed   By: Lajean Manes M.D.   On: 01/01/2015 15:22    Impression: Recent MRI scan has revealed a new 9 x 8 mm LEFT cerebellar lesion.  At this point, the patient would potentially benefit from radiotherapy. The options include whole brain irradiation versus stereotactic radiosurgery. There are pros and cons associated with each of these potential treatment options. Whole brain radiotherapy would treat the known metastatic deposits and help provide some reduction of risk for future brain metastases. However, whole brain radiotherapy carries potential risks including hair loss, subacute  somnolence, and neurocognitive changes including a possible reduction in short-term memory. Whole brain radiotherapy also may carry a lower likelihood of tumor control at the treatment sites because of the low-dose used. Stereotactic radiosurgery carries a higher likelihood for local tumor control at the targeted sites with lower associated risk for neurocognitive changes such as memory loss. However, the use of stereotactic radiosurgery in this setting may leave the patient at increased risk for new brain metastases elsewhere in the brain as high as 50-60%. Accordingly, patients who receive stereotactic radiosurgery in this setting should undergo ongoing surveillance imaging with brain MRI more frequently in order to identify and treat new small brain metastases before they become symptomatic. Stereotactic radiosurgery does carry some different risks, including a risk of radionecrosis.  PLAN: Today, I reviewed the findings and workup thus far with the patient. We discussed the dilemma regarding whole brain radiotherapy versus stereotactic radiosurgery. We discussed the pros and cons of each. We also discussed the logistics and delivery of each. We reviewed the results associated with each of the treatments described above. The patient seems to understand the treatment options and would like to proceed with stereotactic radiosurgery.  I spent 15 minutes minutes face to face with the patient and more than 50% of that time was spent in counseling and/or coordination of care.    _____________________________________  Sheral Apley. Tammi Klippel, M.D.   This document serves as a record of services personally performed by Tyler Pita, MD. It was created on his behalf by Derek Mound, a trained medical scribe. The creation of this record is based on the scribe's personal observations and the provider's statements to them. This document has been checked and approved by the attending provider.

## 2015-01-15 NOTE — Progress Notes (Signed)
Weight and vitals stable. Denies pain. Reports difficulty remembering to take her decadron 2 mg once per day as directed. No thrush noted. All family members and patients concerned about decline of patient's short term memory. Denies headache, dizziness, nausea, vomiting, diplopia or ringing in the ears. Requesting a prescription for a different smoking cessation patch.   BP 128/74 mmHg  Pulse 95  Resp 16  Wt 193 lb 12.6 oz (87.9 kg)  SpO2 100% Wt Readings from Last 3 Encounters:  01/15/15 193 lb 12.6 oz (87.9 kg)  01/02/15 194 lb (87.998 kg)  12/29/14 194 lb 4.8 oz (88.134 kg)

## 2015-01-15 NOTE — Addendum Note (Signed)
Encounter addended by: Tyler Pita, MD on: 01/15/2015 12:42 PM<BR>     Documentation filed: Follow-up Section, LOS Section, Problem List

## 2015-01-16 ENCOUNTER — Encounter: Payer: Self-pay | Admitting: Radiation Oncology

## 2015-01-16 ENCOUNTER — Ambulatory Visit: Payer: Medicaid Other | Admitting: Gastroenterology

## 2015-01-16 DIAGNOSIS — Z510 Encounter for antineoplastic radiation therapy: Secondary | ICD-10-CM | POA: Diagnosis not present

## 2015-01-16 DIAGNOSIS — C7800 Secondary malignant neoplasm of unspecified lung: Secondary | ICD-10-CM | POA: Diagnosis present

## 2015-01-16 DIAGNOSIS — C7931 Secondary malignant neoplasm of brain: Secondary | ICD-10-CM | POA: Insufficient documentation

## 2015-01-16 DIAGNOSIS — C801 Malignant (primary) neoplasm, unspecified: Secondary | ICD-10-CM | POA: Diagnosis not present

## 2015-01-16 NOTE — Telephone Encounter (Signed)
Results not available as of today

## 2015-01-17 ENCOUNTER — Encounter: Payer: Self-pay | Admitting: Radiation Oncology

## 2015-01-17 ENCOUNTER — Ambulatory Visit
Admission: RE | Admit: 2015-01-17 | Discharge: 2015-01-17 | Disposition: A | Discharge status: Home / Self Care | Payer: Medicaid Other | Source: Ambulatory Visit | Attending: Radiation Oncology | Admitting: Radiation Oncology

## 2015-01-17 ENCOUNTER — Encounter: Payer: Self-pay | Admitting: *Deleted

## 2015-01-17 VITALS — BP 134/77 | HR 80 | Temp 98.2°F

## 2015-01-17 DIAGNOSIS — Z510 Encounter for antineoplastic radiation therapy: Secondary | ICD-10-CM | POA: Diagnosis not present

## 2015-01-17 DIAGNOSIS — C7931 Secondary malignant neoplasm of brain: Secondary | ICD-10-CM

## 2015-01-17 NOTE — Consult Note (Signed)
Patient BS:[id number] G Destiny [name]      DOB: 01-05-1971      OQH:[id number]     Consult Note from the Palliative Medicine Team at [facility] Requested by:     PCP: [place] Reason for Consultation              Phone                                                                                                                                [phone]  Assessment of patients Current state:    Consult is for introduction to the concept of Palliative Medicine as an integral part of holistic, patient-centered care and symptom management as indicated.    Noted that patient has had a hard time with her overall care plan/ treatment and pain management  Compliance.  She had been seen by several different oncologists over the past several months.  Palliative is added as an extra layer of support.   This NP Wadie Lessen reviewed medical records, received report from team and then meet with Destiny [name] and her family to include her son Destiny [name], Destiny [name] and a 17 yo grand-son in the outpatient oncology clinic.  I educated Destiny [name] of the role of Palliative Medicine enhancing patient centered care. Today is emotional for Destiny [name] and her family learning about new brain lesion. Destiny [name] did share with me feelings of frustration with her cancer care over the past many months.  I offered presence and empathetic listening.  Values and goals of care important to patient and family were attempted to be elicited.  PMT will continue to support holistically.    Goals of Care:   Scope of Treatment:  At this time patient  is open to all available  and offered medical interventions to prolong quality life.  Plan is for Stereotactic radiosurgery for new brain metastasis under Dr Tammi Klippel  Patient has an appointment with Dr Sonny Dandy on Monday November 14th for folow-up   Symptom Management:   Destiny [name] has no complaints of pain, she tells me utilizes Advil  And Vicodin as needed and  prescribed.  We discussed briefly the possibility of utilizing the PMT for symptom management  in the future.     Smoking Cessation:  Nicoderm patches are irritating skin, in collaboration with her pharmacy a script for Nicoderm cartridges was order, no contraindication with lung cancer    Psychosocial:  Emotional support offered to patient and her family.  It was quite devastating to hear about further progression of disease.  She is open to talking with this NP in the future for both symptom management and emotional support    Brief HPI:   DIAGNOSIS: Stage IV (T3, N2, M1b) non-small cell lung cancer, adenocarcinoma with negative EGFR mutation and negative for ALK gene rearrangement diagnosed in April 2016  PRIOR THERAPY: Patient initially had Stereotactic radiotherapy to the solitary left posterior  temporal brain lesion under the care of Dr. Tammi Klippel completed 07/03/2014. She was subsequently treated with concurrent chemoradiation with weekly carboplatin for AUC of 2 and paclitaxel 45 MG/M2. Status post 8 cycles completed 09/04/2014. Repeat CT chest on 10/06/2014 showed appropriate decrease in size of her right upper lobe lung nodules and mediastinal disease. Unfortunately her MRI of the brain showed a new right temporal parietal brain met which was subsequently treated with SRS by Dr. Tammi Klippel.   CURRENT THERAPY: Post-treatment re-evaluation to determine further treatments.  Complicated patient related to compliance and pain control  ROS:  Short term memory deficit, worsening   PMH:  Past Medical History  Diagnosis Date  . Thyroid nodule   . Depression   . Suicide attempt ([place])   . SVT (supraventricular tachycardia) ([place])     a. Long RP tachycardia;  b. 01/2014 s/p RFCA.  . Metastatic lung cancer (metastasis from lung to other site) ([place]) 06/2014    Stage IV (T3, N2, M1b) non-small cell lung cancer, adenocarcinoma; treated with chemo radiation (radiation completed 09/04/14).  Brain met  treated with radiation 06/2014.   . Brain metastases ([place])     left brain met treated with radiation 06/2014. MRI 7/27 with new right brain met and shrunken left met.      PSH: Past Surgical History  Procedure Laterality Date  . Thyroid surgery      Removed thyroid nodule, states still has thyroid; 2010  . Supraventricular tachycardia ablation N/A 01/16/2014    Procedure: SUPRAVENTRICULAR TACHYCARDIA ABLATION;  Surgeon: Evans Lance, MD;  Location[facility] C[facility] LAB;  Service: Cardiovascular;  Laterality: N/A;  . Tee without cardioversion N/A 06/12/2014    Procedure: TRANSESOPHAGEAL ECHOCARDIOGRAM (TEE);  Surgeon: Sueanne Margarita, MD;  Location: [facility] ENDOSCOPY;  Service: Cardiovascular;  Laterality: N/A;   I have reviewed the [place] and SH and  If appropriate update it with new information. No Known Allergies Scheduled Meds: Continuous Infusions: PRN Meds:.  There were no vitals taken for this visit.   No intake or output data in the 24 hours ending 01/17/15 1300   Labs: CBC    Component Value Date/Time   WBC 8.2 01/03/2015 1012   WBC 6.8 12/20/2014 0930   RBC 4.22 01/03/2015 1012   RBC 4.65 12/20/2014 0930   HGB 12.5 01/03/2015 1012   HGB 14.1 12/20/2014 0930   HCT 36.8 01/03/2015 1012   HCT 41.1 12/20/2014 0930   PLT 258 01/03/2015 1012   PLT 256 12/20/2014 0930   MCV 87.2 01/03/2015 1012   MCV 88.4 12/20/2014 0930   MCH 29.6 01/03/2015 1012   MCH 30.3 12/20/2014 0930   MCHC 34.0 01/03/2015 1012   MCHC 34.3 12/20/2014 0930   RDW 13.4 01/03/2015 1012   RDW 13.6 12/20/2014 0930   LYMPHSABS 1.0 01/03/2015 1012   LYMPHSABS 1.3 12/20/2014 0930   MONOABS 0.7 01/03/2015 1012   MONOABS 0.7 12/20/2014 0930   EOSABS 0.0 01/03/2015 1012   EOSABS 0.0 12/20/2014 0930   BASOSABS 0.0 01/03/2015 1012   BASOSABS 0.0 12/20/2014 0930    BMET    Component Value Date/Time   NA 140 01/03/2015 1012   NA 135 12/20/2014 0930   K 3.7 01/03/2015 1012   K 4.0 12/20/2014 0930   CL 98*  12/20/2014 0930   CO2 21* 01/03/2015 1012   CO2 27 12/20/2014 0930   GLUCOSE 101 01/03/2015 1012   GLUCOSE 101* 12/20/2014 0930   BUN 8.5 01/03/2015 1012   BUN 13  12/20/2014 0930   CREATININE 0.7 01/03/2015 1012   CREATININE 0.82 12/20/2014 0930   CALCIUM 9.4 01/03/2015 1012   CALCIUM 10.8* 12/20/2014 0930   GFRNONAA >60 12/20/2014 0930   GFRAA >60 12/20/2014 0930    CMP     Component Value Date/Time   NA 140 01/03/2015 1012   NA 135 12/20/2014 0930   K 3.7 01/03/2015 1012   K 4.0 12/20/2014 0930   CL 98* 12/20/2014 0930   CO2 21* 01/03/2015 1012   CO2 27 12/20/2014 0930   GLUCOSE 101 01/03/2015 1012   GLUCOSE 101* 12/20/2014 0930   BUN 8.5 01/03/2015 1012   BUN 13 12/20/2014 0930   CREATININE 0.7 01/03/2015 1012   CREATININE 0.82 12/20/2014 0930   CALCIUM 9.4 01/03/2015 1012   CALCIUM 10.8* 12/20/2014 0930   PROT 7.2 01/03/2015 1012   PROT 7.3 12/20/2014 0930   ALBUMIN 3.5 01/03/2015 1012   ALBUMIN 3.6 12/20/2014 0930   AST 15 01/03/2015 1012   AST 24 12/20/2014 0930   ALT 17 01/03/2015 1012   ALT 28 12/20/2014 0930   ALKPHOS 84 01/03/2015 1012   ALKPHOS 79 12/20/2014 0930   BILITOT <0.30 01/03/2015 1012   BILITOT 0.3 12/20/2014 0930   GFRNONAA >60 12/20/2014 0930   GFRAA >60 12/20/2014 0930   ECOG PERFORMANCE STATUS* (Eastern Cooperative Oncology Group)  0 Fully active, able to continue with all pre-disease activities without restriction. Pt score  1 Restricted in physically strenuous activity but ambulatory and able to carry out work of a light or sedentary nature, e.g., light house work, office work. 1  2 Ambulatory and capable of all self-care but unable to carry out any work activities. Up and about more than 50% of waking hours.    3 Capable of only limited self-care. Confined to bed or chair more than 50% of waking hours.   4 Completely disabled. Cannot carry on any self-care. Totally confined to bed or chair.   5 Dead.    As published in Am. J. Clin.  Oncol.: Eustace Pen, M.M., Colon Flattery., [place], D.C., [place], Sharen Hint., Drexel Iha, P.P.: Toxicity And Response Criteria Of [facility]. [place] 7:353-299, 1982.  The ECOG Performance Status is in the public domain therefore available for public use. To duplicate the scale, please cite the reference above and credit the [facility], Tyler Pita M.D., Group Chair    Time In Time Out Total Time Spent with Patient Total Overall Time  1000 1115 75 min 75 min    Greater than 50%  of this time was spent counseling and coordinating care related to the above assessment and plan.   Wadie Lessen NP  Palliative Medicine Team Team Phone # [phone] Pager [phone]  Discussed with Dr Tammi Klippel and Dr Sonny Dandy

## 2015-01-17 NOTE — Progress Notes (Addendum)
Destiny [name] completed [place] today.  She denies any dizziness, nausea, headache, nor vision changes.  She admits that she has been taking her Dexamethasone very sporadically and cannot remember the last dose.  Her Dexamethasone dose was ordered as 2 mg po daily.  Dr. Lisbeth Renshaw informed and he stated that, at this point, she does not need to take Decadron.  This RN will call and assess her Thursday and Friday of this week.  Destiny [name] Advised to call if she awakens on tomorrow with any of the aformentioned symptoms.  She stated agreement.

## 2015-01-17 NOTE — Progress Notes (Addendum)
Disposition to home accompanied by her Son.  Continued to deny any neurological changes s/p SRS, and she stated that she would take her Decadron as directed, by Dr. Lesle Reek -  2 mg po daily.  Dr. Lisbeth Renshaw informed.  Advised family to monitor her compliance.

## 2015-01-17 NOTE — Op Note (Signed)
Stereotactic Radiosurgery Operative Note  Name: SHANAH GUIMARAES MRN: [id number]  Date: 01/17/2015  DOB: 07/14/1970  Op Note  Pre Operative Diagnosis:  Brain metastasis for metastatic lung cancer  Post Operative Diagnois:  Brain metastasis for metastatic lung cancer  3D TREATMENT PLANNING AND DOSIMETRY:  The patient's radiation plan was reviewed and approved by myself (neurosurgery) and Dr. Kyung Rudd (radiation oncology) prior to treatment.  It showed 3-dimensional radiation distributions overlaid onto the planning CT/MRI image set.  The [facility] for the target structures as well as the organs at risk were reviewed. The documentation of the 3D plan and dosimetry are filed in the radiation oncology EMR.  NARRATIVE:  JILLIAM BELLMORE was brought to the TrueBeam stereotactic radiation treatment machine and placed supine on the CT couch. The head frame was applied, and the patient was set up for stereotactic radiosurgery.  I was present for the set-up and delivery.  SIMULATION VERIFICATION:  In the couch zero-angle position, the patient underwent Exactrac imaging using the Brainlab system with orthogonal KV images.  These were carefully aligned and repeated to confirm treatment position for each of the isocenters.  The Exactrac snap film verification was repeated at each couch angle.  SPECIAL TREATMENT PROCEDURE: MIGUELINA FORE received stereotactic radiosurgery to the following targets: Left cerebellar 9 mm target was treated using 3 Dynamic Conformal Arcs to a prescription dose of 20 Gy.  ExacTrac registration was performed for each couch angle.  The 80.1% isodose line was prescribed.  STEREOTACTIC TREATMENT MANAGEMENT:  Following delivery, the patient was transported to nursing in stable condition and monitored for possible acute effects.  Vital signs were recorded There were no vitals taken for this visit.. The patient tolerated treatment without significant acute effects, and was discharged to home in  stable condition.    PLAN: Follow-up in one month Dr. Ledon Snare.

## 2015-01-18 NOTE — Telephone Encounter (Signed)
I spoke with pathology and they state the path was signed off on 01/10/15.  I have requested a copy of the path to be faxed to our office.  Fax number provided.

## 2015-01-19 DIAGNOSIS — Z515 Encounter for palliative care: Secondary | ICD-10-CM | POA: Insufficient documentation

## 2015-01-19 DIAGNOSIS — F17200 Nicotine dependence, unspecified, uncomplicated: Secondary | ICD-10-CM | POA: Insufficient documentation

## 2015-01-19 DIAGNOSIS — C7931 Secondary malignant neoplasm of brain: Secondary | ICD-10-CM | POA: Insufficient documentation

## 2015-01-22 ENCOUNTER — Ambulatory Visit: Payer: Medicaid Other | Admitting: Hematology and Oncology

## 2015-01-22 NOTE — Progress Notes (Signed)
  Radiation Oncology         [phone] ________________________________  Name: Destiny [name] MRN: [id number]  Date: 01/17/2015  DOB: 1970/09/26   SPECIAL TREATMENT PROCEDURE   3D TREATMENT PLANNING AND DOSIMETRY: The patient's radiation plan was reviewed and approved by Dr. Sherwood Gambler from neurosurgery and radiation oncology prior to treatment. It showed 3-dimensional radiation distributions overlaid onto the planning CT/MRI image set. The [facility] for the target structures as well as the organs at risk were reviewed. The documentation of the 3D plan and dosimetry are filed in the radiation oncology EMR.   NARRATIVE: The patient was brought to the TrueBeam stereotactic radiation treatment machine and placed supine on the CT couch. The head frame was applied, and the patient was set up for stereotactic radiosurgery. Neurosurgery was present for the set-up and delivery   SIMULATION VERIFICATION: In the couch zero-angle position, the patient underwent Exactrac imaging using the Brainlab system with orthogonal KV images. These were carefully aligned and repeated to confirm treatment position for each of the isocenters. The Exactrac snap film verification was repeated at each couch angle.   SPECIAL TREATMENT PROCEDURE: The patient received stereotactic radiosurgery to the following target:  PTV3 Lt Cerebellar 9 mm target was treated using 3 Arcs to a prescription dose of 20 Gy. ExacTrac Snap verification was performed for each couch angle.   STEREOTACTIC TREATMENT MANAGEMENT: Following delivery, the patient was transported to nursing in stable condition and monitored for possible acute effects. Vital signs were recorded . The patient tolerated treatment without significant acute effects, and was discharged to home in stable condition.  PLAN: Follow-up in one month.   ------------------------------------------------  Jodelle Gross, MD, PhD

## 2015-01-22 NOTE — Assessment & Plan Note (Signed)
Stage IV (T3, N2, M1b) non-small cell lung cancer, adenocarcinoma with negative EGFR mutation and negative ALK gene rearrangement diagnosed in April 2016 and presenting with 2 pulmonary nodules in the right upper lobe as well as mediastinal lymphadenopathy and solitary brain lesion. SRS to the solitary left posterior temporal brain lesion by Dr. Tammi Klippel 07/03/2014. S/P concurrent chemoradiation with weekly carboplatin for AUC of 2 and paclitaxel 45 MG/M2. Status post 7 cycles completed 08/28/2014  Staging studies: CT chest 01/01/2015: Small right upper lobe nodule 7 x 5 mm, smaller than before. Radiation changes, anterior mediastinal lymph node stable esophagitis  I discussed with her extensively the treatment that she had received so far. Treatment options include maintenance chemotherapy versus periodic surveillance scans every 2-3 months.  Palliative care: Will be managing all of her pain medication needs.  Return to clinic after the next set of scans.

## 2015-01-22 NOTE — Telephone Encounter (Signed)
Call received in [place] from pt stating she would like to reschedule appointment today to next week.  Pt states she has " other doctors appointments this week and need to reschedule".  This note will be sent to MD/collaborative nurse as well as Tiffany in HIM for notification and rescheduling.

## 2015-01-23 ENCOUNTER — Telehealth: Payer: Self-pay | Admitting: *Deleted

## 2015-01-23 ENCOUNTER — Telehealth: Payer: Self-pay | Admitting: Hematology and Oncology

## 2015-01-23 ENCOUNTER — Encounter: Payer: Self-pay | Admitting: Gastroenterology

## 2015-01-23 NOTE — Telephone Encounter (Signed)
Oncology Nurse Navigator Documentation  Oncology Nurse Navigator Flowsheets 01/23/2015  Navigator Encounter Type Telephone/I called patient today.  She missed her appt with Dr. [name] yesterday.  I left a vm message with my name and phone number to call.   Patient Visit Type Follow-up  Interventions Coordination of Care  Time Spent with Patient 15

## 2015-01-23 NOTE — Telephone Encounter (Signed)
left message for patient to return call [phone]

## 2015-01-24 ENCOUNTER — Encounter: Payer: Self-pay | Admitting: *Deleted

## 2015-01-24 ENCOUNTER — Telehealth: Payer: Self-pay | Admitting: Radiation Oncology

## 2015-01-24 NOTE — Telephone Encounter (Signed)
Phoned patient's son back. Explained there are five available refills of his mother's decadron at the pharmacy. Encouraged him to call [facility] and request a refill. He verbalized understanding.

## 2015-01-24 NOTE — Telephone Encounter (Signed)
Received message from patient's Destiny [name], requesting a refill of his mother's decadron. Called back. No answer. Left message explaining the request has been received and forwarded onto Dr. Tammi Klippel.

## 2015-01-24 NOTE — Progress Notes (Signed)
Oncology Nurse Navigator Documentation  Oncology Nurse Navigator Flowsheets 01/24/2015  Navigator Encounter Type Telephone/patient called me today.  I asked how she was doing.  She stated ok.  I also asked her about her missed appt with Dr. [name].  She stated she did not know why she missed her appt. She stated her brain was not working well.  I stated she will be getting a call from [place] about another appt to see Med Onc.  Patient also wanted me to contact [place] care NP.  I in-basket Mary and updated her.    Interventions Coordination of Care  Time Spent with Patient 15

## 2015-01-25 ENCOUNTER — Telehealth: Payer: Self-pay | Admitting: Gastroenterology

## 2015-01-25 NOTE — Telephone Encounter (Signed)
Pt aware letter has been mailed with results, she will call with any further concerns

## 2015-01-25 NOTE — Telephone Encounter (Signed)
Pt called and told the results over the phone

## 2015-01-25 NOTE — Telephone Encounter (Signed)
Patient returned phone call. She states that she has not received a letter regarding results.

## 2015-01-29 ENCOUNTER — Telehealth: Payer: Self-pay | Admitting: *Deleted

## 2015-01-29 NOTE — Telephone Encounter (Signed)
Oncology Nurse Navigator Documentation  Oncology Nurse Navigator Flowsheets 01/29/2015  Navigator Encounter Type Telephone/called patient to check on her. She did not show up for her last med onc appt.  I was able to speak with patient. She would like to be set up to see med onc.  I called Tiffany to arrange appt.  I left her a vm message.  Patient is update that I notified Tiffany to arrange appt.   Patient Visit Type Follow-up  Treatment Phase -  Barriers/Navigation Needs -  Interventions Coordination of Care  Coordination of Care -  Support Groups/Services -  Time Spent with Patient 15

## 2015-02-03 NOTE — Progress Notes (Signed)
  Radiation Oncology         [phone] ________________________________  Name: Destiny [name] MRN: [id number]  Date: 01/15/15  DOB: [birth year]  SIMULATION AND TREATMENT PLANNING NOTE  DIAGNOSIS:  Stage IV lung cancer based on a treated brain metastasis and local stage III disease  NARRATIVE:  The patient was brought to the [place] suite.  Identity was confirmed.  All relevant records and images related to the planned course of therapy were reviewed.  The patient freely provided informed written consent to proceed with treatment after reviewing the details related to the planned course of therapy. The consent form was witnessed and verified by the simulation staff. Intravenous access was established for contrast administration. Then, the patient was set-up in a stable reproducible supine position for radiation therapy.  A relocatable thermoplastic stereotactic head frame was fabricated for precise immobilization.  CT images were obtained.  Surface markings were placed.  The CT images were loaded into the planning software and fused with the patient's targeting MRI scan.  Then the target and avoidance structures were contoured.  Treatment planning then occurred.  The radiation prescription was entered and confirmed.  I have requested 3D planning  I have requested a DVH of the following structures: Brain stem, brain, left eye, right eye, lenses, optic chiasm, target volumes, uninvolved brain, and normal tissue.    SPECIAL TREATMENT PROCEDURE:  The planned course of therapy using radiation constitutes a special treatment procedure. Special care is required in the management of this patient for the following reasons. This treatment constitutes a Special Treatment Procedure for the following reason: High dose per fraction requiring special monitoring for increased toxicities of treatment including daily imaging.  The special nature of the planned course of radiotherapy will require increased  physician supervision and oversight to ensure patient's safety with optimal treatment outcomes.  PLAN:  The patient will receive 20 Gy in 1 fraction.  ________________________________  Sheral Apley Tammi Klippel, M.D.

## 2015-02-03 NOTE — Progress Notes (Signed)
°  Radiation Oncology         [phone] ________________________________  Name: Destiny [name] MRN: [id number]  Date: 01/17/2015  DOB: 03/31/1970  End of Treatment Note  Diagnosis:   44 yo woman with a left cerebellar 9 mm brain metastasis     Indication for treatment:  Palliation       Radiation treatment dates:   01/17/2015  Site/dose/beams/energy:   The patient received stereotactic radiosurgery to the following target:  PTV3 Lt Cerebellar 9 mm target was treated using 3 Arcs to a prescription dose of 20 Gy. ExacTrac Snap verification was performed for each couch angle.  Narrative: The patient tolerated radiation treatment relatively well.   She did not experience any side effects from radiation.  Plan: The patient has completed radiation treatment. The patient will return to radiation oncology clinic for routine followup in one month. I advised her to call or return sooner if she has any questions or concerns related to her recovery or treatment. ________________________________  Sheral Apley. Tammi Klippel, M.D.  This document serves as a record of services personally performed by Tyler Pita, MD. It was created on his behalf by Arlyce Harman, a trained medical scribe. The creation of this record is based on the scribe's personal observations and the provider's statements to them. This document has been checked and approved by the attending provider.

## 2015-02-07 ENCOUNTER — Encounter: Payer: Self-pay | Admitting: *Deleted

## 2015-02-07 ENCOUNTER — Telehealth: Payer: Self-pay | Admitting: Hematology and Oncology

## 2015-02-07 NOTE — Progress Notes (Signed)
Oncology Nurse Navigator Documentation  Oncology Nurse Navigator Flowsheets 02/07/2015  Navigator Encounter Type Other/I looked to see if patient has another appt with Dr. [name].  I noticed there was not an appt noted.  I called HIM dept, they called and left a message and patient has not called back. HIM will call today to schedule.   Patient Visit Type Follow-up  Interventions Coordination of Care  Time Spent with Patient 15

## 2015-02-07 NOTE — Telephone Encounter (Signed)
S/w patient and gave np appt for 12/06 @ 12:30 w/Dr. [name].

## 2015-02-12 ENCOUNTER — Telehealth: Payer: Self-pay | Admitting: Radiation Oncology

## 2015-02-12 NOTE — Telephone Encounter (Signed)
May need to increase decadron 4 mg BID

## 2015-02-12 NOTE — Telephone Encounter (Signed)
Patient left message requesting a return call. Patient states, "my head has been hurting for a week."

## 2015-02-12 NOTE — Telephone Encounter (Signed)
Returned patient's call. Wanting to obtain additional details about her "head hurting." No answer. Left message requesting return call.

## 2015-02-12 NOTE — Telephone Encounter (Signed)
Patient called back. Patient reports she has been taking decadron 2 mg daily faithfully for 4 weeks as directed. Patient states, "I have had a headache that doesn't get any better with tylenol, motrin or a tablet and a half of decadron." Denies dizziness, nausea, vomiting, diplopia or ringing in the ears. Patient denies any other complaints. Patient understands this nurse will inform Dr. Tammi Klippel of these findings and call her back with further directions.

## 2015-02-13 ENCOUNTER — Telehealth: Payer: Self-pay | Admitting: Radiation Oncology

## 2015-02-13 ENCOUNTER — Ambulatory Visit: Payer: Medicaid Other | Admitting: Gastroenterology

## 2015-02-13 ENCOUNTER — Encounter: Payer: Self-pay | Admitting: Hematology and Oncology

## 2015-02-13 ENCOUNTER — Ambulatory Visit (HOSPITAL_BASED_OUTPATIENT_CLINIC_OR_DEPARTMENT_OTHER): Payer: Medicaid Other | Admitting: Hematology and Oncology

## 2015-02-13 ENCOUNTER — Telehealth: Payer: Self-pay | Admitting: Hematology and Oncology

## 2015-02-13 VITALS — BP 120/72 | HR 98 | Temp 98.4°F | Resp 18 | Ht 67.0 in | Wt 201.7 lb

## 2015-02-13 DIAGNOSIS — Z720 Tobacco use: Secondary | ICD-10-CM

## 2015-02-13 DIAGNOSIS — K2100 Gastro-esophageal reflux disease with esophagitis, without bleeding: Secondary | ICD-10-CM

## 2015-02-13 DIAGNOSIS — C7931 Secondary malignant neoplasm of brain: Secondary | ICD-10-CM

## 2015-02-13 DIAGNOSIS — C7949 Secondary malignant neoplasm of other parts of nervous system: Principal | ICD-10-CM

## 2015-02-13 DIAGNOSIS — C3411 Malignant neoplasm of upper lobe, right bronchus or lung: Secondary | ICD-10-CM | POA: Diagnosis not present

## 2015-02-13 MED ORDER — CYANOCOBALAMIN 1000 MCG/ML IJ SOLN
INTRAMUSCULAR | Status: AC
  Filled 2015-02-13: qty 1

## 2015-02-13 MED ORDER — BUPROPION HCL ER (SR) 150 MG PO TB12: 150.0000 mg | ORAL_TABLET | Freq: Two times a day (BID) | ORAL | Status: DC

## 2015-02-13 MED ORDER — CYANOCOBALAMIN 1000 MCG/ML IJ SOLN
1000.0000 ug | Freq: Once | INTRAMUSCULAR | Status: AC
  Administered 2015-02-13: 1000 ug via INTRAMUSCULAR

## 2015-02-13 MED ORDER — DEXAMETHASONE 4 MG PO TABS: 4.0000 mg | ORAL_TABLET | Freq: Two times a day (BID) | ORAL | Status: DC

## 2015-02-13 MED ORDER — FOLIC ACID 1 MG PO TABS: 1.0000 mg | ORAL_TABLET | Freq: Every day | ORAL | Status: DC

## 2015-02-13 MED ORDER — ONDANSETRON HCL 8 MG PO TABS: 8.0000 mg | ORAL_TABLET | Freq: Two times a day (BID) | ORAL | Status: DC

## 2015-02-13 MED ORDER — CYANOCOBALAMIN 1000 MCG/ML IJ SOLN: 1000.0000 ug | Freq: Once | INTRAMUSCULAR | Status: DC

## 2015-02-13 NOTE — Telephone Encounter (Signed)
Appointments made and avs printed for patient °

## 2015-02-13 NOTE — Telephone Encounter (Signed)
Patient left message requesting return call.

## 2015-02-13 NOTE — Progress Notes (Signed)
Patient Care Team: [place] as PCP - [place], MD as Consulting Physician (Hematology and Oncology) Manus Gunning, MD as Consulting Physician (Gastroenterology)  DIAGNOSIS: Primary cancer of right upper lobe of lung [facility]   Staging form: Lung, AJCC 7th Edition     Clinical stage from 07/13/2014: Stage IV (T3, N2, M1b) - Signed by Curt Bears, MD on 07/15/2014   SUMMARY OF ONCOLOGIC HISTORY: Oncology History   Patient presented to ED with c/o HA, work up showed brain lesion.   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 Negative for CK 20, CD X2, estrogen receptor, consistent with lung primary. EGFR negative    06/20/2014 Imaging Brain MRI with and without contrast showed solitary lesion in the left posterior temporal lobe measuring 12 x 14 mm, with peripheral ring enhancement and surrounding vasogenic edema.   06/23/2014 PET scan 2 hypermetabolic pulmonary nodules in the right upper lobe, hypermetabolic right peritracheal and subcarinal nodes, no other distant metastasis.   06/29/2014 - 06/29/2014 Radiation Therapy SRS to brain lesion    07/18/2014 - 08/28/2014 Chemotherapy Chemotherapy Carbo/Taxol weekly 7 concurrently with radiation   01/01/2015 Imaging right upper lobe nodule 7 mm, 2 additional nodules right upper lung, focal peripheral ill-defined opacities a new due to inflammation from radiation, mediastinal lymph nodes stable   01/12/2015 Imaging brain MRI: Slight interval regrowth of left inferior temporal lesion 10 x 9 mm was previously 9 x 8 mm, interval improvement of previous right parietal superficial lesion  previously 11 mm now 2 mm   01/17/2015 Procedure stereotactic radiosurgery to the 9 x 8 mm left cerebellar lesion    CHIEF COMPLIANT: follow-up to discuss treatment options  INTERVAL HISTORY: Destiny [name] is a 44 year old lady with above-mentioned history of stage IV lung cancer was originally diagnosed in April 2016 with brain metastases. She underwent stereotactic surgery to the brain followed by concurrent chemoradiation with Destiny [name] Taxol from May to June 2016. After the chemotherapy and radiation CT scan revealed stable disease in the lung. Repeat brain MRI done in November revealed slight interval growth in the brain lesion and she underwent stereotactic radiosurgery on 01/17/2015. She has previously seen Dr. Julien Nordmann, Dr. Burr Medico, Dr. [name] and at that time apparently there were issues regarding her pain prescriptions that led to discontent between her and the staff. She is now pain-free and does not need any pain medications. She is here today accompanied by her stepson to discuss chemotherapy as maintenance strategy. She is seeking help to quit smoking. She had tried liquid or patches which cause significant itching.  REVIEW OF SYSTEMS:   Constitutional: Denies fevers, chills or abnormal weight loss Eyes: Denies blurriness of vision Ears, nose, mouth, throat, and face: Denies mucositis or sore throat Respiratory: Denies cough, dyspnea or wheezes Cardiovascular: Denies palpitation, chest discomfort or lower extremity swelling Gastrointestinal:  Denies nausea, heartburn or change in bowel habits Skin: Denies abnormal skin rashes Lymphatics: Denies new lymphadenopathy or easy bruising Neurological:Denies numbness, tingling or new weaknesses Behavioral/Psych: Mood is stable, no new changes  All other systems were reviewed with the patient and are negative.  I have reviewed the past medical history, past surgical history, social history and family history with the patient and they are unchanged  from previous note.  ALLERGIES:  has No Known Allergies.  MEDICATIONS:  Current Outpatient Prescriptions  Medication Sig Dispense Refill  . acetaminophen (TYLENOL) 500 MG tablet Take 1,000 mg by mouth every 6 (six) hours as needed (pain).    . clonazePAM (KLONOPIN) 0.5 MG tablet Take 1 tablet (0.5 mg total) by mouth 2 (two) times daily as needed for anxiety. 60 tablet 0  . dexamethasone (DECADRON) 4 MG tablet Take 1 tablet (4 mg total) by mouth 2 (two) times daily. 60 tablet 1  . Hydrocodone-Acetaminophen 5-300 MG TABS Take 1 tablet by mouth every 6 (six) hours as needed (pain). 30 each 0  . hydrOXYzine (VISTARIL) 25 MG capsule Take 1 capsule (25 mg total) by mouth 3 (three) times daily as needed for itching or anxiety. (Patient not taking: Reported on 01/02/2015) 60 capsule 0  . ibuprofen (ADVIL,MOTRIN) 200 MG tablet Take 400 mg by mouth every 6 (six) hours as needed.    . lidocaine (XYLOCAINE) 2 % solution Swallow 5 ml 15 minutes before meals (Patient not taking: Reported on 01/02/2015) 450 mL 0  . lisinopril-hydrochlorothiazide (PRINZIDE,ZESTORETIC) 10-12.5 MG per tablet Take 1 tablet by mouth daily.  0  . LORazepam (ATIVAN) 1 MG tablet take 1 tablet by mouth three times a day if needed for anxiety or 1 TABLET 30 MINUTES BEFORE MRI  0  . mirtazapine (REMERON) 30 MG tablet Take 30 mg by mouth at bedtime.   0  . NICOTROL 10 MG inhaler   0  . pantoprazole (PROTONIX) 40 MG tablet Take 40 mg by mouth 2 (two) times daily before a meal.     . sucralfate (CARAFATE) 1 GM/10ML suspension Take 10 mLs (1 g total) by mouth 4 (four) times daily -  with meals and at bedtime. (Patient not taking: Reported on 01/02/2015) 420 mL 1   No current facility-administered medications for this visit.    PHYSICAL EXAMINATION: ECOG PERFORMANCE STATUS: 1 - Symptomatic but completely ambulatory  Filed Vitals:   02/13/15 1253  BP: 120/72  Pulse: 98  Temp: 98.4 F (36.9 C)  Resp: 18   Filed Weights   02/13/15  1253  Weight: 201 lb 11.2 oz (91.491 kg)    GENERAL:alert, no distress and comfortable SKIN: skin color, texture, turgor are normal, no rashes or significant lesions EYES: Complains of headache OROPHARYNX:no exudate, no erythema and lips, buccal mucosa, and tongue normal  NECK: supple, thyroid normal size, non-tender, without nodularity LYMPH:  no palpable lymphadenopathy in the cervical, axillary or inguinal LUNGS: clear to auscultation and percussion with normal breathing effort HEART: regular rate & rhythm and no murmurs and no lower extremity edema ABDOMEN:abdomen soft, non-tender and normal bowel sounds Musculoskeletal:no cyanosis of digits and no clubbing  NEURO: alert & oriented x 3 with fluent speech, no focal motor/sensory deficits Patient reports that she has extremely high stress levels at home because she does not have family support from her children as well as her siblings.  LABORATORY DATA:  I have reviewed the data as listed   Chemistry      Component Value Date/Time   NA 140 01/03/2015 1012   NA 135 12/20/2014 0930   K 3.7 01/03/2015 1012   K 4.0 12/20/2014 0930   CL 98* 12/20/2014 0930   CO2 21* 01/03/2015 1012   CO2 27 12/20/2014 0930   BUN  8.5 01/03/2015 1012   BUN 13 12/20/2014 0930   CREATININE 0.7 01/03/2015 1012   CREATININE 0.82 12/20/2014 0930      Component Value Date/Time   CALCIUM 9.4 01/03/2015 1012   CALCIUM 10.8* 12/20/2014 0930   ALKPHOS 84 01/03/2015 1012   ALKPHOS 79 12/20/2014 0930   AST 15 01/03/2015 1012   AST 24 12/20/2014 0930   ALT 17 01/03/2015 1012   ALT 28 12/20/2014 0930   BILITOT <0.30 01/03/2015 1012   BILITOT 0.3 12/20/2014 0930       Lab Results  Component Value Date   WBC 8.2 01/03/2015   HGB 12.5 01/03/2015   HCT 36.8 01/03/2015   MCV 87.2 01/03/2015   PLT 258 01/03/2015   NEUTROABS 6.5 01/03/2015   ASSESSMENT & PLAN:  Primary cancer of right upper lobe of lung Stage IV (T3, N2, M1b) non-small cell lung  cancer, adenocarcinoma with negative EGFR mutation and negative for ALK gene rearrangement diagnosed in April 2016  PRIOR THERAPY: Patient initially had Stereotactic radiotherapy to the solitary left posterior temporal brain lesion under the care of Dr. Tammi Klippel completed 07/03/2014. She was subsequently treated with concurrent chemoradiation with weekly carboplatin for AUC of 2 and paclitaxel 45 MG/M2. Status post 8 cycles completed 09/04/2014. Repeat CT chest on 10/06/2014 showed appropriate decrease in size of her right upper lobe lung nodules and mediastinal disease.status post SRS to cerebellar lesion 01/15/2015  Recurrent headaches: Dr. Tammi Klippel has been monitoring it. He increased the dose of Decadron to 4 mg twice a day.  Treatment plan: Alimta maintenance every 3 weeks (Plan to treat until progression or adverse effects) I discussed the risks and benefits of Alimta including the risk of cytopenias especially neutropenia causing risk of infection, risk of anemia causing fatigue, decrease in platelets causing oozing or bleeding, nausea, neuropathy, bone marrow toxicities. Patient understands these risks and consented to proceed with treatment.  B-12 injection to be given today Folic acid 1 mg daily to be started today. Plan to start chemotherapy next week  Return to clinic in 2 weeks for toxicity check No orders of the defined types were placed in this encounter.   The patient has a good understanding of the overall plan. she agrees with it. she will call with any problems that may develop before the next visit here.   Rulon Eisenmenger, MD 02/13/2015

## 2015-02-13 NOTE — Telephone Encounter (Signed)
Per Dr. Johny Shears order called in Decadron 4 mg bid, qty 60 and 1 refill.

## 2015-02-13 NOTE — Assessment & Plan Note (Signed)
Stage IV (T3, N2, M1b) non-small cell lung cancer, adenocarcinoma with negative EGFR mutation and negative for ALK gene rearrangement diagnosed in April 2016  PRIOR THERAPY: Patient initially had Stereotactic radiotherapy to the solitary left posterior temporal brain lesion under the care of Dr. Tammi Klippel completed 07/03/2014. She was subsequently treated with concurrent chemoradiation with weekly carboplatin for AUC of 2 and paclitaxel 45 MG/M2. Status post 8 cycles completed 09/04/2014. Repeat CT chest on 10/06/2014 showed appropriate decrease in size of her right upper lobe lung nodules and mediastinal disease.status post SRS to cerebellar lesion 01/15/2015

## 2015-02-13 NOTE — Telephone Encounter (Signed)
Phoned patient. Per Dr. Johny Shears order directed her to increase her Decadron to 4 mg bid. Patient reports the pills she has now are 2 mg. Instructed patient to take two 2 mg tablets in the morning and two 2 mg tablets at night. Explained this RN will call in her new script of 4 mg bid. Stressed that once she picks up the new script she will take one 4 mg tablet in the morning and one 4 mg tablet at night. Reminded patient of 1 pm appointment with Dr. [name] today. Patient verbalized understanding of all reviewed.

## 2015-02-13 NOTE — Telephone Encounter (Signed)
Phoned patient back. Explained that Dr. Johny Shears hasn't given this RN any directions yet but, once he does I will call her back. Patient verbalized understanding.

## 2015-02-19 ENCOUNTER — Telehealth: Payer: Self-pay | Admitting: *Deleted

## 2015-02-19 ENCOUNTER — Ambulatory Visit
Admission: RE | Admit: 2015-02-19 | Discharge: 2015-02-19 | Disposition: A | Discharge status: Home / Self Care | Payer: Medicaid Other | Source: Ambulatory Visit | Attending: Radiation Oncology | Admitting: Radiation Oncology

## 2015-02-19 ENCOUNTER — Telehealth: Payer: Self-pay

## 2015-02-19 DIAGNOSIS — R05 Cough: Secondary | ICD-10-CM

## 2015-02-19 DIAGNOSIS — C3411 Malignant neoplasm of upper lobe, right bronchus or lung: Secondary | ICD-10-CM

## 2015-02-19 DIAGNOSIS — R059 Cough, unspecified: Secondary | ICD-10-CM

## 2015-02-19 DIAGNOSIS — C7931 Secondary malignant neoplasm of brain: Secondary | ICD-10-CM

## 2015-02-19 MED ORDER — AZITHROMYCIN 250 MG PO TABS: ORAL_TABLET | ORAL | Status: DC

## 2015-02-19 MED ORDER — DEXTROMETHORPHAN HBR 15 MG/5ML PO SYRP: 10.0000 mL | ORAL_SOLUTION | Freq: Four times a day (QID) | ORAL | Status: DC | PRN

## 2015-02-19 NOTE — Telephone Encounter (Signed)
Per note from triage, pt c/o congestion and cough x 3 days.  Per Dr. [name], delay chemo start 1 week, order zpack and cough med.  Rx sent to [facility].  pof sent.  Let pt know meds at pharmacy and she should expect to hear from scheduling to move her chemo.

## 2015-02-19 NOTE — Telephone Encounter (Signed)
Patient called this am.  She is due her first chemotherapy tomorrow, however, she is "sick as crap"   She has cough and congestion for three days.  She is not sure about fever as she just got a thermometer yesterday and the one time she checked it did not have fever.  She had to borrow five dollars to buy robitussin and only got a small bottle and it is now gone.  She would like a prescription called in to [facility] on [address] so she can use her Medicaid card.  Also what should she do about her scheduled treatment tomorrow.  Patient call back is [phone].

## 2015-02-20 ENCOUNTER — Other Ambulatory Visit: Payer: Medicaid Other

## 2015-02-20 ENCOUNTER — Ambulatory Visit: Payer: Medicaid Other

## 2015-02-26 ENCOUNTER — Telehealth: Payer: Self-pay | Admitting: *Deleted

## 2015-02-26 ENCOUNTER — Other Ambulatory Visit: Payer: Self-pay | Admitting: *Deleted

## 2015-02-26 ENCOUNTER — Telehealth: Payer: Self-pay | Admitting: Hematology and Oncology

## 2015-02-26 NOTE — Telephone Encounter (Signed)
Per staff message and POF I have scheduled appts. Advised scheduler of appts and first available given. JMW  

## 2015-02-26 NOTE — Telephone Encounter (Signed)
per pof to sch pt appt-sent Mw email to sch trmt-will call pt after reply °

## 2015-02-26 NOTE — Telephone Encounter (Signed)
I need to reschedule tomorrow's appointment.  I just finished antibiotic yesterday but I am coughing, blowing my nose and full of mucus and congested still.  I'd like to reschedule for next week." Will notify Dr.Gudena.

## 2015-02-26 NOTE — Telephone Encounter (Signed)
pof sent to cancel 12/20 appts and reschedule to next week

## 2015-02-27 ENCOUNTER — Telehealth: Payer: Self-pay | Admitting: Hematology and Oncology

## 2015-02-27 ENCOUNTER — Other Ambulatory Visit: Payer: Medicaid Other

## 2015-02-27 ENCOUNTER — Ambulatory Visit: Payer: Medicaid Other

## 2015-02-27 ENCOUNTER — Ambulatory Visit: Payer: Medicaid Other | Admitting: Hematology and Oncology

## 2015-02-27 NOTE — Telephone Encounter (Signed)
CLD & SPOKE W/PT AND GAVE PT TIME & DTAE OF APPT-ADV DELAY BETWEEN MD & CHEMO-ADV PT TO GET UPDATED COPY OF AVS ON 12/29

## 2015-02-28 ENCOUNTER — Telehealth: Payer: Self-pay | Admitting: Radiation Oncology

## 2015-02-28 NOTE — Telephone Encounter (Signed)
-----   Message from Kyung Rudd, MD sent at 02/28/2015  7:28 AM EST ----- Regarding: RE: Decadron Taper Request That should be fine to go back to once a day. She would need to let us know if she has any further significant headaches. Thanks.  JM  ----- Message -----    From: Heywood Footman, RN    Sent: 02/27/2015  11:28 AM      To: Tyler Pita, MD, Kyung Rudd, MD Subject: Decadron Taper Request                         Dr. Lisbeth Renshaw.  Mrs. Stolze phoned today questioning if she could begin to taper her decadron because her husband "is afraid before too much longer he will need to request food vouchers to be able to afford to feed her." Dr. Tammi Klippel increased her decadron to 4 mg bid on 02/13/15 and this remains her current dose.   [name]

## 2015-02-28 NOTE — Telephone Encounter (Signed)
Per Dr. Ida Rogue order called patient and instructed her to taper her decadron 4 mg bid to once a day. Encouraged her to continue decadron 4 mg once a day until her follow up appointment with Dr. Tammi Klippel on 03/18/2014. Patient verbalized understanding of all discussed and expressed appreciation for the call.

## 2015-03-06 ENCOUNTER — Telehealth: Payer: Self-pay | Admitting: *Deleted

## 2015-03-06 ENCOUNTER — Telehealth: Payer: Self-pay | Admitting: Radiation Oncology

## 2015-03-06 ENCOUNTER — Other Ambulatory Visit: Payer: Self-pay | Admitting: *Deleted

## 2015-03-06 DIAGNOSIS — C3411 Malignant neoplasm of upper lobe, right bronchus or lung: Secondary | ICD-10-CM

## 2015-03-06 NOTE — Telephone Encounter (Signed)
Patient phoned requesting a return call from this RN. Phoned patient back. She has been taking decadron 4 mg once a day since 12/21. The patient is requesting to taper down to decadron 2 mg once per day. Patient states, "I am miserable and eating up everything in the house." Patient denies headache, dizziness, nausea, vomiting, diplopia or ringing in the ears. Patient understands this RN will inform Dr. Tammi Klippel of these findings and call her back with further directions.

## 2015-03-06 NOTE — Telephone Encounter (Signed)
Received message from patient that she has discussed this with her family and she wants to stop chemo until her next "test". Also she has been taking the Wellbutrin for anxiety and smoking cessation but states that it is not doing anything. Discussed with Dr. [name], pof sent to cancel chemo and CT ordered for January. Called patient and advised of this as well as offered smoking cessation classes. Patient declined to get this information stating that she had an appt with the counselor here this week and she would talk to them about it.

## 2015-03-07 ENCOUNTER — Telehealth: Payer: Self-pay | Admitting: Radiation Oncology

## 2015-03-07 ENCOUNTER — Telehealth: Payer: Self-pay | Admitting: Hematology and Oncology

## 2015-03-07 NOTE — Telephone Encounter (Signed)
Aware of new appointment times

## 2015-03-07 NOTE — Telephone Encounter (Signed)
Patient left a message requesting a return call.

## 2015-03-07 NOTE — Telephone Encounter (Signed)
Phoned patient to inquire about status. She reports she feels "a little better." Reminded patient decadron should be tapered and dosages shouldn't change daily. Patient understands to take decadron 4 mg once daily until follow up with Dr. Tammi Klippel on January 9th. Patient understands to call with future needs.

## 2015-03-07 NOTE — Telephone Encounter (Signed)
Returned patient's call. Patient reports that yesterday without waiting for advise from Dr. Tammi Klippel she took decadron 2 mg instead of 4 mg. She states, "I woke up with a headache, puffiness in my face and pressure behind my eyes so, I took a whole tablet (4 mg)." Patient reports she took the 4 mg tablet approximately thirty minutes earlier. Offered to have the patient evaluated by a radiation oncology physician but, she declined. Patient reports she has no form of transportation and will just lay down to see if this helps her feel better.

## 2015-03-08 ENCOUNTER — Other Ambulatory Visit: Payer: Medicaid Other

## 2015-03-08 ENCOUNTER — Ambulatory Visit: Payer: Medicaid Other | Admitting: Hematology and Oncology

## 2015-03-08 ENCOUNTER — Ambulatory Visit: Payer: Medicaid Other

## 2015-03-11 ENCOUNTER — Emergency Department (HOSPITAL_COMMUNITY)
Admission: EM | Admit: 2015-03-11 | Discharge: 2015-03-11 | Disposition: A | Discharge status: Home / Self Care | Payer: Medicaid Other | Attending: Emergency Medicine | Admitting: Emergency Medicine

## 2015-03-11 ENCOUNTER — Encounter (HOSPITAL_COMMUNITY): Payer: Self-pay | Admitting: Emergency Medicine

## 2015-03-11 ENCOUNTER — Emergency Department (HOSPITAL_COMMUNITY): Payer: Medicaid Other

## 2015-03-11 DIAGNOSIS — Z79899 Other long term (current) drug therapy: Secondary | ICD-10-CM | POA: Insufficient documentation

## 2015-03-11 DIAGNOSIS — Z792 Long term (current) use of antibiotics: Secondary | ICD-10-CM | POA: Diagnosis not present

## 2015-03-11 DIAGNOSIS — R091 Pleurisy: Secondary | ICD-10-CM | POA: Diagnosis not present

## 2015-03-11 DIAGNOSIS — Z8639 Personal history of other endocrine, nutritional and metabolic disease: Secondary | ICD-10-CM | POA: Insufficient documentation

## 2015-03-11 DIAGNOSIS — Z85841 Personal history of malignant neoplasm of brain: Secondary | ICD-10-CM | POA: Diagnosis not present

## 2015-03-11 DIAGNOSIS — Z8679 Personal history of other diseases of the circulatory system: Secondary | ICD-10-CM | POA: Diagnosis not present

## 2015-03-11 DIAGNOSIS — Z7952 Long term (current) use of systemic steroids: Secondary | ICD-10-CM | POA: Diagnosis not present

## 2015-03-11 DIAGNOSIS — R059 Cough, unspecified: Secondary | ICD-10-CM

## 2015-03-11 DIAGNOSIS — F329 Major depressive disorder, single episode, unspecified: Secondary | ICD-10-CM | POA: Insufficient documentation

## 2015-03-11 DIAGNOSIS — Z85118 Personal history of other malignant neoplasm of bronchus and lung: Secondary | ICD-10-CM | POA: Insufficient documentation

## 2015-03-11 DIAGNOSIS — F17210 Nicotine dependence, cigarettes, uncomplicated: Secondary | ICD-10-CM | POA: Insufficient documentation

## 2015-03-11 DIAGNOSIS — R05 Cough: Secondary | ICD-10-CM | POA: Diagnosis present

## 2015-03-11 MED ORDER — GUAIFENESIN-CODEINE 100-10 MG/5ML PO SOLN
10.0000 mL | Freq: Once | ORAL | Status: AC
  Administered 2015-03-11: 10 mL via ORAL
  Filled 2015-03-11: qty 10

## 2015-03-11 MED ORDER — OXYCODONE-ACETAMINOPHEN 5-325 MG PO TABS
2.0000 | ORAL_TABLET | Freq: Once | ORAL | Status: AC
  Administered 2015-03-11: 2 via ORAL
  Filled 2015-03-11: qty 2

## 2015-03-11 MED ORDER — IBUPROFEN 200 MG PO TABS
600.0000 mg | ORAL_TABLET | Freq: Once | ORAL | Status: AC
  Administered 2015-03-11: 600 mg via ORAL
  Filled 2015-03-11: qty 3

## 2015-03-11 MED ORDER — GUAIFENESIN-CODEINE 100-10 MG/5ML PO SOLN: 10.0000 mL | Freq: Three times a day (TID) | ORAL | Status: DC | PRN

## 2015-03-11 NOTE — ED Provider Notes (Signed)
CSN: [id number]     Arrival date & time 03/11/15  0846 History   First MD Initiated Contact with Patient 03/11/15 [phone]     Chief Complaint  Patient presents with  . Pleurisy  . Cough     (Consider location/radiation/quality/duration/timing/severity/associated sxs/prior Treatment) HPI   45 year old female with right-sided chest pain. Gradual onset about a week ago. Pain is worse with deep inhalation and coughing. Chest pain is sharp. No step in change with exertion. Does not radiate. No fevers or chills. Has recently been treated with azithromycin for "respiratory infection." She reports no improvement of symptoms despite finishing antibiotics. No unusual leg pain or swelling.  Past Medical History  Diagnosis Date  . Thyroid nodule   . Depression   . Suicide attempt ([place])   . SVT (supraventricular tachycardia) ([place])     a. Long RP tachycardia;  b. 01/2014 s/p RFCA.  . Metastatic lung cancer (metastasis from lung to other site) ([place]) 06/2014    Stage IV (T3, N2, M1b) non-small cell lung cancer, adenocarcinoma; treated with chemo radiation (radiation completed 09/04/14).  Brain met treated with radiation 06/2014.   . Brain metastases ([place])     left brain met treated with radiation 06/2014. MRI 7/27 with new right brain met and shrunken left met.    Past Surgical History  Procedure Laterality Date  . Thyroid surgery      Removed thyroid nodule, states still has thyroid; 2010  . Supraventricular tachycardia ablation N/A 01/16/2014    Procedure: SUPRAVENTRICULAR TACHYCARDIA ABLATION;  Surgeon: Evans Lance, MD;  Location: [facility] CATH LAB;  Service: Cardiovascular;  Laterality: N/A;  . Tee without cardioversion N/A 06/12/2014    Procedure: TRANSESOPHAGEAL ECHOCARDIOGRAM (TEE);  Surgeon: Sueanne Margarita, MD;  Location: [facility] ENDOSCOPY;  Service: Cardiovascular;  Laterality: N/A;   Family History  Problem Relation Age of Onset  . Seizures Mother   . Colon cancer Father   . Hypertension Father   .  Diabetes Maternal Grandmother   . Diabetes Sister   . Kidney disease Neg Hx   . Liver disease Neg Hx   . Stomach cancer Neg Hx   . Esophageal cancer Neg Hx   . Thyroid cancer Sister    Social History  Substance Use Topics  . Smoking status: Current Every Day Smoker -- 1.00 packs/day for 30 years    Types: Cigarettes    Last Attempt to Quit: 06/13/2014  . Smokeless tobacco: Current User     Comment: form given 12/29/14, vapor  . 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   Allergies  Review of patient's allergies indicates no known allergies.  Home Medications   Prior to Admission medications   Medication Sig Start Date End Date Taking? Authorizing Provider  acetaminophen (TYLENOL) 500 MG tablet Take 1,000 mg by mouth every 6 (six) hours as needed (pain).    Historical Provider, MD  azithromycin (ZITHROMAX Z-PAK) 250 MG tablet Take as directed 02/19/15   Nicholas Lose, MD  buPROPion (WELLBUTRIN SR) 150 MG 12 hr tablet Take 1 tablet (150 mg total) by mouth 2 (two) times daily. 02/13/15   Nicholas Lose, MD  clonazePAM (KLONOPIN) 0.5 MG tablet Take 1 tablet (0.5 mg total) by mouth 2 (two) times daily as needed for anxiety.  11/22/14   Adrena E Johnson, PA-C  dexamethasone (DECADRON) 4 MG tablet Take 1 tablet (4 mg total) by mouth 2 (two) times daily. 02/13/15   Tyler Pita, MD  dextromethorphan 15 MG/5ML syrup Take 10 mLs (30 mg total) by mouth 4 (four) times daily as needed for cough. 02/19/15   Nicholas Lose, MD  folic acid (FOLVITE) 1 MG tablet Take 1 tablet (1 mg total) by mouth daily. Start 5-7 days before Alimta chemotherapy. Continue until 21 days after Alimta completed. 02/13/15   Nicholas Lose, MD  Hydrocodone-Acetaminophen 5-300 MG TABS Take 1 tablet by mouth every 6 (six) hours as needed (pain). 01/11/15   Amy S Esterwood, PA-C   hydrOXYzine (VISTARIL) 25 MG capsule Take 1 capsule (25 mg total) by mouth 3 (three) times daily as needed for itching or anxiety. Patient not taking: Reported on 01/02/2015 11/22/14   Burnetta Sabin E Johnson, PA-C  ibuprofen (ADVIL,MOTRIN) 200 MG tablet Take 400 mg by mouth every 6 (six) hours as needed.    Historical Provider, MD  lidocaine (XYLOCAINE) 2 % solution Swallow 5 ml 15 minutes before meals Patient not taking: Reported on 01/02/2015 12/29/14   Amy S Esterwood, PA-C  lisinopril-hydrochlorothiazide (PRINZIDE,ZESTORETIC) 10-12.5 MG per tablet Take 1 tablet by mouth daily. 10/20/14   Historical Provider, MD  LORazepam (ATIVAN) 1 MG tablet take 1 tablet by mouth three times a day if needed for anxiety or 1 TABLET 30 MINUTES BEFORE MRI 01/05/15   Historical Provider, MD  mirtazapine (REMERON) 30 MG tablet Take 30 mg by mouth at bedtime.  11/09/14   Historical Provider, MD  NICOTROL 10 MG inhaler  01/16/15   Historical Provider, MD  ondansetron (ZOFRAN) 8 MG tablet Take 1 tablet (8 mg total) by mouth 2 (two) times daily. Start the day after chemo for 2 days. Then take as needed for nausea and vomiting. 02/13/15   Nicholas Lose, MD  pantoprazole (PROTONIX) 40 MG tablet Take 40 mg by mouth 2 (two) times daily before a meal.  08/28/14 08/28/15  Historical Provider, MD  sucralfate (CARAFATE) 1 GM/10ML suspension Take 10 mLs (1 g total) by mouth 4 (four) times daily -  with meals and at bedtime. Patient not taking: Reported on 01/02/2015 10/23/14   Susanne Borders, NP   BP 122/77 mmHg  Pulse 89  Temp(Src) 98 F (36.7 C) (Oral)  Resp 16  Ht 5' 7"  (1.702 m)  Wt 213 lb (96.616 kg)  BMI 33.35 kg/m2  SpO2 97%  LMP 02/12/2015 Physical Exam  Constitutional: She appears well-developed and well-nourished. No distress.  HENT:  Head: Normocephalic and atraumatic.  Eyes: Conjunctivae are normal. Right eye exhibits no discharge. Left eye exhibits no discharge.  Neck: Neck supple.  Cardiovascular: Normal rate,  regular rhythm and normal heart sounds.  Exam reveals no gallop and no friction rub.   No murmur heard. Pulmonary/Chest: Effort normal and breath sounds normal. No respiratory distress. She exhibits no tenderness.  Abdominal: Soft. She exhibits no distension. There is no tenderness.  Musculoskeletal: She exhibits no edema or tenderness.  Lower extremities symmetric as compared to each other. No calf tenderness. Negative Homan's. No palpable cords.   Neurological: She is alert.  Skin: Skin is warm and dry.  Psychiatric: She has a normal mood and affect. Her behavior is normal. Thought content normal.  Nursing note and vitals reviewed.   ED Course  Procedures (including critical care time) Labs Review Labs Reviewed - No data to display  Imaging Review No  results found.   Dg Chest 2 View  03/11/2015  CLINICAL DATA:  45 year old female with right-sided chest and rib pain with cough. Recent treatment for lung cancer. EXAM: CHEST  2 VIEW COMPARISON:  01/01/2015 and prior CTs. 12/20/2014 and prior chest radiographs. FINDINGS: The cardiomediastinal silhouette is unremarkable. Increased subsegmental right upper lobe atelectasis/ scarring noted. A 7 mm nodule inferior to this area of atelectasis/scarring is noted. There is no evidence of pulmonary edema, pneumothorax, pleural effusion or acute bony abnormality. IMPRESSION: Increased subsegmental right upper lobe atelectasis/ scarring. 7 mm nodule slightly inferior to this area may represent one of the areas previously identified on CT. Electronically Signed   By: Margarette Canada M.D.   On: 03/11/2015 11:14   I have personally reviewed and evaluated these images and lab results as part of my medical decision-making.   EKG Interpretation None      MDM   Final diagnoses:  Cough   44yf with pleuritic R sided CP with cough. CXR w/o acute abnormality. Afebrile. Denies acute dyspnea. Just finished course of abx. Consider PE with hx of CA, but  clinically doubt.      Virgel Manifold, MD 03/23/15 1258

## 2015-03-11 NOTE — ED Notes (Signed)
Pt reports R sided rib/ chest pain, worse with inhalation and cough.  Pt reports recent tx for lung cancer, also recent cold treated antibiotics.  Pt reports finishing antibiotics last week.Pt also reports possible injury to R ribcage last week.  Pt able to speak in full sentences, NAD noted.

## 2015-03-13 ENCOUNTER — Other Ambulatory Visit: Payer: Medicaid Other

## 2015-03-13 ENCOUNTER — Ambulatory Visit: Payer: Medicaid Other

## 2015-03-19 ENCOUNTER — Ambulatory Visit: Admission: RE | Admit: 2015-03-19 | Payer: Medicaid Other | Source: Ambulatory Visit | Admitting: Radiation Oncology

## 2015-03-22 ENCOUNTER — Ambulatory Visit
Admission: RE | Admit: 2015-03-22 | Discharge: 2015-03-22 | Disposition: A | Discharge status: Home / Self Care | Payer: Medicaid Other | Source: Ambulatory Visit | Attending: Radiation Oncology | Admitting: Radiation Oncology

## 2015-03-22 ENCOUNTER — Encounter: Payer: Self-pay | Admitting: Radiation Oncology

## 2015-03-22 ENCOUNTER — Telehealth: Payer: Self-pay | Admitting: Radiation Oncology

## 2015-03-22 VITALS — BP 139/81 | HR 105 | Temp 98.0°F | Resp 18 | Wt 219.5 lb

## 2015-03-22 DIAGNOSIS — C7931 Secondary malignant neoplasm of brain: Secondary | ICD-10-CM

## 2015-03-22 DIAGNOSIS — C3411 Malignant neoplasm of upper lobe, right bronchus or lung: Secondary | ICD-10-CM

## 2015-03-22 NOTE — Progress Notes (Signed)
error 

## 2015-03-22 NOTE — Progress Notes (Signed)
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 She tolerated treatment, but did experience multiple episodes of headaches which were managed with decadron. She continues to take this at 2 mg daily. A chest x-ray on  03/11/2015 revealed subsegmental right upper lobe atelectasis/scar consistent with her radiation treatment and a 7 mm nodule inferior to this.   ROS: She is having pain in her right hip but finds that this seems to been worsened and after her grandson walked on her back. She states that it seems to get better not standing in one place for. Of time. She also reports that she is not experiencing any cough, fevers or chills. She denies shortness of breath or chest pain. In the last month , she has gained about 20 pounds and attributes this to Decadron. She is not experiencing any headache, dizziness, blurred vision double vision, nausea vomiting or tinnitus. She has been trying to quit smoking and is currently on Wellbutrin but does not feel like this is helping as well.  Systems is obtained and is otherwise negative.          ALLERGIES:  has No Known Allergies.  Meds: Current Outpatient Prescriptions  Medication Sig Dispense Refill  . buPROPion (WELLBUTRIN SR) 150 MG 12 hr tablet Take 1 tablet (150 mg total) by mouth 2 (two) times daily. 60 tablet 2  . dexamethasone (DECADRON) 2 MG tablet   0  . acetaminophen  (TYLENOL) 500 MG tablet Take 1,000 mg by mouth every 6 (six) hours as needed (pain). Reported on 03/22/2015    . azithromycin (ZITHROMAX Z-PAK) 250 MG tablet Take as directed (Patient not taking: Reported on 03/22/2015) 6 each 0  . clonazePAM (KLONOPIN) 0.5 MG tablet Take 1 tablet (0.5 mg total) by mouth 2 (two) times daily as needed for anxiety. (Patient not taking: Reported on 03/22/2015) 60 tablet 0  . cycloSPORINE (RESTASIS) 0.05 % ophthalmic emulsion Reported on 03/22/2015    . dexamethasone (DECADRON) 4 MG tablet Take 1 tablet (4 mg total) by mouth 2 (two) times daily. (Patient not taking: Reported on 03/22/2015) 60 tablet 1  . dextromethorphan 15 MG/5ML syrup Take 10 mLs (30 mg total) by mouth 4 (four) times daily as needed for cough. (Patient not taking: Reported on 6/73/4193) 790 mL 1  . folic acid (FOLVITE) 1 MG tablet Take 1 tablet (1 mg total) by mouth daily. Start 5-7 days before Alimta chemotherapy. Continue until 21 days after Alimta completed. (Patient not taking: Reported on 03/22/2015) 100 tablet 3  . guaiFENesin-codeine 100-10 MG/5ML syrup Take 10 mLs by mouth 3 (three) times daily as needed for cough. (Patient not taking: Reported on 03/22/2015) 120 mL 0  . HYDROcodone-acetaminophen (NORCO/VICODIN) 5-325 MG tablet Reported on 03/22/2015  0  . Hydrocodone-Acetaminophen 5-300 MG TABS Take 1 tablet by mouth every 6 (six) hours as needed (pain). (Patient not taking: Reported on  03/22/2015) 30 each 0  . hydrOXYzine (VISTARIL) 25 MG capsule Take 1 capsule (25 mg total) by mouth 3 (three) times daily as needed for itching or anxiety. (Patient not taking: Reported on 01/02/2015) 60 capsule 0  . ibuprofen (ADVIL,MOTRIN) 200 MG tablet Take 400 mg by mouth every 6 (six) hours as needed. Reported on 03/22/2015    . lidocaine (XYLOCAINE) 2 % solution Swallow 5 ml 15 minutes before meals (Patient not taking: Reported on 01/02/2015) 450 mL 0  . lisinopril-hydrochlorothiazide (PRINZIDE,ZESTORETIC) 10-12.5 MG  per tablet Take 1 tablet by mouth daily. Reported on 03/22/2015  0  . LORazepam (ATIVAN) 1 MG tablet Reported on 03/22/2015  0  . mirtazapine (REMERON) 30 MG tablet Take 30 mg by mouth at bedtime. Reported on 03/22/2015  0  . NICOTROL 10 MG inhaler Reported on 03/22/2015  0  . Olopatadine HCl (PAZEO) 0.7 % SOLN Reported on 03/22/2015    . ondansetron (ZOFRAN) 8 MG tablet Take 1 tablet (8 mg total) by mouth 2 (two) times daily. Start the day after chemo for 2 days. Then take as needed for nausea and vomiting. (Patient not taking: Reported on 03/22/2015) 30 tablet 1  . pantoprazole (PROTONIX) 40 MG tablet Take 40 mg by mouth 2 (two) times daily before a meal. Reported on 03/22/2015    . sucralfate (CARAFATE) 1 GM/10ML suspension Take 10 mLs (1 g total) by mouth 4 (four) times daily -  with meals and at bedtime. (Patient not taking: Reported on 01/02/2015) 420 mL 1   No current facility-administered medications for this encounter.    Physical Findings: BP 139/81 mmHg  Pulse 105  Temp(Src) 98 F (36.7 C) (Oral)  Resp 18  Wt 219 lb 8 oz (99.565 kg)  SpO2 100%  LMP 02/12/2015  Pain scale   Lab Findings: Lab Results  Component Value Date   WBC 8.2 01/03/2015   WBC 6.8 12/20/2014   HGB 12.5 01/03/2015   HGB 14.1 12/20/2014   HCT 36.8 01/03/2015   HCT 41.1 12/20/2014   PLT 258 01/03/2015   PLT 256 12/20/2014    Lab Results  Component Value Date   NA 140 01/03/2015   NA 135 12/20/2014   K 3.7 01/03/2015   K 4.0 12/20/2014   CHLORIDE 109 01/03/2015   CO2 21* 01/03/2015   CO2 27 12/20/2014   GLUCOSE 101 01/03/2015   GLUCOSE 101* 12/20/2014   BUN 8.5 01/03/2015   BUN 13 12/20/2014   CREATININE 0.7 01/03/2015   CREATININE 0.82 12/20/2014   BILITOT <0.30 01/03/2015   BILITOT 0.3 12/20/2014   ALKPHOS 84 01/03/2015   ALKPHOS 79 12/20/2014   AST 15 01/03/2015   AST 24 12/20/2014   ALT 17 01/03/2015   ALT 28 12/20/2014   PROT 7.2 01/03/2015   PROT 7.3 12/20/2014   ALBUMIN 3.5  01/03/2015   ALBUMIN 3.6 12/20/2014   CALCIUM 9.4 01/03/2015   CALCIUM 10.8* 12/20/2014   ANIONGAP 9 01/03/2015   ANIONGAP 10 12/20/2014    Radiographic Findings: Dg Chest 2 View  03/11/2015  CLINICAL DATA:  45 year old female with right-sided chest and rib pain with cough. Recent treatment for lung cancer. EXAM: CHEST  2 VIEW COMPARISON:  01/01/2015 and prior CTs. 12/20/2014 and prior chest radiographs. FINDINGS: The cardiomediastinal silhouette is unremarkable. Increased subsegmental right upper lobe atelectasis/ scarring noted. A 7 mm nodule inferior to this area of atelectasis/scarring is noted. There is no evidence of pulmonary edema, pneumothorax, pleural effusion or acute bony abnormality.  IMPRESSION: Increased subsegmental right upper lobe atelectasis/ scarring. 7 mm nodule slightly inferior to this area may represent one of the areas previously identified on CT. Electronically Signed   By: Margarette Canada M.D.   On: 03/11/2015 11:14    Impression:  The patient is recovering from the effects of radiation.  Her headaches have improved, and I would recommend discontinuing dexamethasone at this time.  Plan:  Stop dexamethasone, repeat brain MRI in one month, then follow-up.  The above documentation reflects my direct findings during this shared patient visit. Please see the separate note by Dr. Tammi Klippel on this date for the remainder of the patient's plan of care.  Carola Rhine, PAC  ------------------------------   Please see the note from Shona Simpson, PA-C from today's visit for more details of today's encounter.  I have personally performed a face to face diagnostic evaluation on this patient and devised the following assessment and plan.   ------------------------------------------------   Tyler Pita, MD [place] Director and Director of Stereotactic Radiosurgery Direct Dial: [phone]  Fax: [phone] [email or web address]  Skype   LinkedIn

## 2015-03-22 NOTE — Progress Notes (Addendum)
Vitals stable. Weight elevated by 20 lb since December. Denies headache, dizziness, nausea, vomiting, diplopia or ringing in the ears. Reports taking Decadron 2 mg daily. No thrush noted. Reports taking Wellbutrin to stop smoking but, doesn't feel like this is helping much. Reports right side rib pain worse with cough and deep inhalation. Reports right side rib pain 7 on a scale of 0-10. Reports productive cough with thick yellow sputum. Denies hematuria. Denies pain with swallowing. Reports shortness of breath with exertion. Reports since gaining weight if she stand for extended periods she experiences bilateral hip pain that is "curcial." Provided patient with CDC information reference 1-800-quit-now.       BP 139/81 mmHg  Pulse 105  Temp(Src) 98 F (36.7 C) (Oral)  Resp 18  Wt 219 lb 8 oz (99.565 kg)  SpO2 100%  LMP 02/12/2015 Wt Readings from Last 3 Encounters:  03/22/15 219 lb 8 oz (99.565 kg)  03/11/15 213 lb (96.616 kg)  02/13/15 201 lb 11.2 oz (91.491 kg)

## 2015-03-24 ENCOUNTER — Emergency Department (HOSPITAL_COMMUNITY): Payer: Medicaid Other

## 2015-03-24 ENCOUNTER — Emergency Department (HOSPITAL_COMMUNITY)
Admission: EM | Admit: 2015-03-24 | Discharge: 2015-03-24 | Disposition: A | Discharge status: Home / Self Care | Payer: Medicaid Other | Attending: Emergency Medicine | Admitting: Emergency Medicine

## 2015-03-24 ENCOUNTER — Encounter (HOSPITAL_COMMUNITY): Payer: Self-pay | Admitting: Emergency Medicine

## 2015-03-24 DIAGNOSIS — F17210 Nicotine dependence, cigarettes, uncomplicated: Secondary | ICD-10-CM | POA: Insufficient documentation

## 2015-03-24 DIAGNOSIS — I10 Essential (primary) hypertension: Secondary | ICD-10-CM | POA: Insufficient documentation

## 2015-03-24 DIAGNOSIS — R0781 Pleurodynia: Secondary | ICD-10-CM

## 2015-03-24 DIAGNOSIS — Z85841 Personal history of malignant neoplasm of brain: Secondary | ICD-10-CM | POA: Diagnosis not present

## 2015-03-24 DIAGNOSIS — F329 Major depressive disorder, single episode, unspecified: Secondary | ICD-10-CM | POA: Insufficient documentation

## 2015-03-24 DIAGNOSIS — Z79899 Other long term (current) drug therapy: Secondary | ICD-10-CM | POA: Insufficient documentation

## 2015-03-24 DIAGNOSIS — R05 Cough: Secondary | ICD-10-CM | POA: Insufficient documentation

## 2015-03-24 DIAGNOSIS — R1011 Right upper quadrant pain: Secondary | ICD-10-CM

## 2015-03-24 DIAGNOSIS — Z85118 Personal history of other malignant neoplasm of bronchus and lung: Secondary | ICD-10-CM | POA: Diagnosis not present

## 2015-03-24 DIAGNOSIS — R059 Cough, unspecified: Secondary | ICD-10-CM

## 2015-03-24 DIAGNOSIS — Z8639 Personal history of other endocrine, nutritional and metabolic disease: Secondary | ICD-10-CM | POA: Insufficient documentation

## 2015-03-24 HISTORY — DX: Essential (primary) hypertension: I10

## 2015-03-24 LAB — CBC WITH DIFFERENTIAL/PLATELET
BASOS ABS: 0 10*3/uL (ref 0.0–0.1)
BASOS PCT: 0 %
Eosinophils Absolute: 0 10*3/uL (ref 0.0–0.7)
Eosinophils Relative: 0 %
HEMATOCRIT: 37.8 % (ref 36.0–46.0)
HEMOGLOBIN: 12.6 g/dL (ref 12.0–15.0)
LYMPHS PCT: 13 %
Lymphs Abs: 1.2 10*3/uL (ref 0.7–4.0)
MCH: 29.4 pg (ref 26.0–34.0)
MCHC: 33.3 g/dL (ref 30.0–36.0)
MCV: 88.3 fL (ref 78.0–100.0)
MONOS PCT: 12 %
Monocytes Absolute: 1.1 10*3/uL — ABNORMAL HIGH (ref 0.1–1.0)
NEUTROS PCT: 75 %
Neutro Abs: 6.7 10*3/uL (ref 1.7–7.7)
Platelets: 281 10*3/uL (ref 150–400)
RBC: 4.28 MIL/uL (ref 3.87–5.11)
RDW: 17 % — ABNORMAL HIGH (ref 11.5–15.5)
WBC: 9.1 10*3/uL (ref 4.0–10.5)

## 2015-03-24 LAB — COMPREHENSIVE METABOLIC PANEL
ALBUMIN: 3.5 g/dL (ref 3.5–5.0)
ALK PHOS: 73 U/L (ref 38–126)
ALT: 31 U/L (ref 14–54)
ANION GAP: 9 (ref 5–15)
AST: 22 U/L (ref 15–41)
BUN: 13 mg/dL (ref 6–20)
CALCIUM: 9.2 mg/dL (ref 8.9–10.3)
CO2: 28 mmol/L (ref 22–32)
Chloride: 103 mmol/L (ref 101–111)
Creatinine, Ser: 0.78 mg/dL (ref 0.44–1.00)
GFR calc non Af Amer: >60 mL/min (ref >60)
GLUCOSE: 93 mg/dL (ref 65–99)
POTASSIUM: 4.4 mmol/L (ref 3.5–5.1)
SODIUM: 140 mmol/L (ref 135–145)
TOTAL PROTEIN: 6.7 g/dL (ref 6.5–8.1)
Total Bilirubin: 0.5 mg/dL (ref 0.3–1.2)

## 2015-03-24 LAB — D-DIMER, QUANTITATIVE: D-Dimer, Quant: 0.94 ug/mL-FEU — ABNORMAL HIGH (ref 0.00–0.50)

## 2015-03-24 MED ORDER — HYDROCODONE-ACETAMINOPHEN 5-325 MG PO TABS: 1.0000 | ORAL_TABLET | ORAL | Status: DC | PRN

## 2015-03-24 MED ORDER — IOHEXOL 350 MG/ML SOLN
80.0000 mL | Freq: Once | INTRAVENOUS | Status: AC | PRN
  Administered 2015-03-24: 80 mL via INTRAVENOUS

## 2015-03-24 MED ORDER — LEVOFLOXACIN 750 MG PO TABS: 750.0000 mg | ORAL_TABLET | Freq: Every day | ORAL | Status: DC

## 2015-03-24 MED ORDER — OXYCODONE-ACETAMINOPHEN 5-325 MG PO TABS
1.0000 | ORAL_TABLET | Freq: Once | ORAL | Status: AC
  Administered 2015-03-24: 1 via ORAL
  Filled 2015-03-24: qty 1

## 2015-03-24 NOTE — Discharge Instructions (Signed)
You were evaluated in the ED today for your cough and chest discomfort. There does not appear to be an emergent cause for your symptoms at this time. Your discomfort may be related to pneumonia, for which she will be prescribed antibiotics. Please follow-up with your cancer doctor tomorrow for reevaluation. Return to ED for any new or worsening symptoms. Take your pain medicine as we discussed.

## 2015-03-24 NOTE — ED Provider Notes (Signed)
CSN: [id number]     Arrival date & time 03/24/15  0957 History  By signing my name below, I, Destiny [name], attest that this documentation has been prepared under the direction and in the presence of [employer], PA-C. Electronically Signed: Starleen [name] ED Scribe. 03/24/2015. 10:27 AM.    Chief Complaint  Patient presents with  . rib pain    . Abdominal Pain    RUQ   The history is provided by the patient and medical records. No language interpreter was used.   HPI Comments: Destiny [name] is a 45 y.o. female with hx of brain and lung cancer who presents to the Emergency Department complaining of constant, moderate right-sided chest soreness as well as right mid-back soreness onset two weeks ago; worse with movement, breathing, and persistent, non-bloody cough.  The patient states her grandson was standing on her back/chest just prior to onset.  The patient was seen in the ED on 03/11/15 for the same complaint where she had a normal CXR and was prescribed cough medication, which she has used with some relief.  She reports continued cough and states today's complaints improved after her visit but recently began to worse again.  The patient was recently evaluated by her oncologist and states that her lung cancer is in remission.  She is scheduled for PET scan in early February.  Patient does not use birth control.  She denies hx of DVT/PE.  She denies SOB, leg swelling, fever.       Past Medical History  Diagnosis Date  . Thyroid nodule   . Depression   . Suicide attempt ([place])   . SVT (supraventricular tachycardia) ([place])     a. Long RP tachycardia;  b. 01/2014 s/p RFCA.  . Metastatic lung cancer (metastasis from lung to other site) ([place]) 06/2014    Stage IV (T3, N2, M1b) non-small cell lung cancer, adenocarcinoma; treated with chemo radiation (radiation completed 09/04/14).  Brain met treated with radiation 06/2014.   . Brain metastases ([place])     left brain met treated with radiation 06/2014.  MRI 7/27 with new right brain met and shrunken left met.   . Hypertension    Past Surgical History  Procedure Laterality Date  . Thyroid surgery      Removed thyroid nodule, states still has thyroid; 2010  . Supraventricular tachycardia ablation N/A 01/16/2014    Procedure: SUPRAVENTRICULAR TACHYCARDIA ABLATION;  Surgeon: Evans Lance, MD;  Location: [facility] CATH LAB;  Service: Cardiovascular;  Laterality: N/A;  . Tee without cardioversion N/A 06/12/2014    Procedure: TRANSESOPHAGEAL ECHOCARDIOGRAM (TEE);  Surgeon: Sueanne Margarita, MD;  Location[facility] ENDOSCOPY;  Service: Cardiovascular;  Laterality: N/A;   Family History  Problem Relation Age of Onset  . Seizures Mother   . Colon cancer Father   . Hypertension Father   . Diabetes Maternal Grandmother   . Diabetes Sister   . Kidney disease Neg Hx   . Liver disease Neg Hx   . Stomach cancer Neg Hx   . Esophageal cancer Neg Hx   . Thyroid cancer Sister    Social History  Substance Use Topics  . Smoking status: Current Every Day Smoker -- 1.00 packs/day for 30 years    Types: Cigarettes    Last Attempt to Quit: 06/13/2014  . Smokeless tobacco: Current User     Comment: form given 12/29/14, vapor  . 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   Allergies  Review of patient's allergies indicates no known allergies.  Home Medications   Prior to Admission medications   Medication Sig Start Date End Date Taking? Authorizing Provider  acetaminophen (TYLENOL) 500 MG tablet Take 1,000 mg by mouth every 6 (six) hours as needed (pain). Reported on 03/22/2015    Historical Provider, MD  azithromycin (ZITHROMAX Z-PAK) 250 MG tablet Take as directed Patient not taking: Reported on 03/22/2015 02/19/15   Nicholas Lose, MD  buPROPion [facility] SR) 150 MG 12 hr tablet Take 1  tablet (150 mg total) by mouth 2 (two) times daily. 02/13/15   Nicholas Lose, MD  clonazePAM (KLONOPIN) 0.5 MG tablet Take 1 tablet (0.5 mg total) by mouth 2 (two) times daily as needed for anxiety. Patient not taking: Reported on 03/22/2015 11/22/14   Carlton Adam, PA-C  cycloSPORINE (RESTASIS) 0.05 % ophthalmic emulsion Reported on 03/22/2015 06/06/14   Historical Provider, MD  dexamethasone (DECADRON) 2 MG tablet  01/31/15   Historical Provider, MD  dexamethasone (DECADRON) 4 MG tablet Take 1 tablet (4 mg total) by mouth 2 (two) times daily. Patient not taking: Reported on 03/22/2015 02/13/15   Tyler Pita, MD  dextromethorphan 15 MG/5ML syrup Take 10 mLs (30 mg total) by mouth 4 (four) times daily as needed for cough. Patient not taking: Reported on 03/22/2015 02/19/15   Nicholas Lose, MD  folic acid (FOLVITE) 1 MG tablet Take 1 tablet (1 mg total) by mouth daily. Start 5-7 days before Alimta chemotherapy. Continue until 21 days after Alimta completed. Patient not taking: Reported on 03/22/2015 02/13/15   Nicholas Lose, MD  guaiFENesin-codeine 100-10 MG/5ML syrup Take 10 mLs by mouth 3 (three) times daily as needed for cough. Patient not taking: Reported on 03/22/2015 03/11/15   Virgel Manifold, MD  HYDROcodone-acetaminophen (NORCO/VICODIN) 5-325 MG tablet Take 1-2 tablets by mouth every 4 (four) hours as needed. 03/24/15   Comer Locket, PA-C  hydrOXYzine (VISTARIL) 25 MG capsule Take 1 capsule (25 mg total) by mouth 3 (three) times daily as needed for itching or anxiety. Patient not taking: Reported on 01/02/2015 11/22/14   Burnetta Sabin E Johnson, PA-C  ibuprofen (ADVIL,MOTRIN) 200 MG tablet Take 400 mg by mouth every 6 (six) hours as needed. Reported on 03/22/2015    Historical Provider, MD  levofloxacin (LEVAQUIN) 750 MG tablet Take 1 tablet (750 mg total) by mouth daily. 03/24/15   Comer Locket, PA-C  lidocaine (XYLOCAINE) 2 % solution Swallow 5 ml 15 minutes before meals Patient not taking: Reported  on 01/02/2015 12/29/14   Amy S Esterwood, PA-C  lisinopril-hydrochlorothiazide (PRINZIDE,ZESTORETIC) 10-12.5 MG per tablet Take 1 tablet by mouth daily. Reported on 03/22/2015 10/20/14   Historical Provider, MD  LORazepam (ATIVAN) 1 MG tablet Reported on 03/22/2015 01/05/15   Historical Provider, MD  mirtazapine (REMERON) 30 MG tablet Take 30 mg by mouth at bedtime. Reported on 03/22/2015 11/09/14   Historical Provider, MD  NICOTROL 10 MG inhaler Reported on 03/22/2015 01/16/15   Historical Provider, MD  Olopatadine HCl (PAZEO) 0.7 % SOLN Reported on 03/22/2015 06/06/14   Historical Provider, MD  ondansetron (ZOFRAN) 8 MG tablet Take 1 tablet (8 mg total) by mouth 2 (two) times daily. Start the day after chemo for 2 days. Then take as needed for nausea and vomiting. Patient not taking: Reported on  03/22/2015 02/13/15   Nicholas Lose, MD  pantoprazole (PROTONIX) 40 MG tablet Take 40 mg by mouth 2 (two) times daily before a meal. Reported on 03/22/2015 08/28/14 08/28/15  Historical Provider, MD  sucralfate (CARAFATE) 1 GM/10ML suspension Take 10 mLs (1 g total) by mouth 4 (four) times daily -  with meals and at bedtime. Patient not taking: Reported on 01/02/2015 10/23/14   Susanne Borders, NP   BP 118/69 mmHg  Pulse 78  Temp(Src) 97.4 F (36.3 C) (Oral)  Resp 14  Ht 5' 7"  (1.702 m)  Wt 100.415 kg  BMI 34.66 kg/m2  SpO2 98%  LMP 03/16/2015 Physical Exam  Constitutional: She is oriented to person, place, and time. She appears well-developed and well-nourished. No distress.  HENT:  Head: Normocephalic and atraumatic.  Eyes: Conjunctivae and EOM are normal.  Neck: Neck supple. No tracheal deviation present.  Cardiovascular: Normal rate, regular rhythm and normal heart sounds.   Pulmonary/Chest: Effort normal and breath sounds normal. No respiratory distress.  Lungs clear.   Abdominal: Soft. There is no tenderness.  Abdomen is soft, diffuse tenderness in RUQ.  No other rash, crepitus or deformities to right  chest or flank.  Musculoskeletal: Normal range of motion.  Neurological: She is alert and oriented to person, place, and time.  Skin: Skin is warm and dry.  Psychiatric: She has a normal mood and affect. Her behavior is normal.  Nursing note and vitals reviewed.   ED Course  Procedures (including critical care time)  DIAGNOSTIC STUDIES: Oxygen Saturation is 100% on RA, normal by my interpretation.    COORDINATION OF CARE:  10:25 AM Will order CXR, labs, and pain medication.  Patient acknowledges and agrees with plan.    Labs Review Labs Reviewed  CBC WITH DIFFERENTIAL/PLATELET - Abnormal; Notable for the following:    RDW 17.0 (*)    Monocytes Absolute 1.1 (*)    All other components within normal limits  D-DIMER, QUANTITATIVE (NOT AT [facility]) - Abnormal; Notable for the following:    D-Dimer, Quant 0.94 (*)    All other components within normal limits  COMPREHENSIVE METABOLIC PANEL    Imaging Review Ct Angio Chest Pe W/cm &/or Wo Cm  03/24/2015  CLINICAL DATA:  Chest pain, back pain, elevated D-dimer. History of lung carcinoma. EXAM: CT ANGIOGRAPHY CHEST WITH CONTRAST TECHNIQUE: Multidetector CT imaging of the chest was performed using the standard protocol during bolus administration of intravenous contrast. Multiplanar CT image reconstructions and MIPs were obtained to evaluate the vascular anatomy. CONTRAST:  42m OMNIPAQUE IOHEXOL 350 MG/ML SOLN COMPARISON:  01/01/2015 FINDINGS: Left arm IV contrast injection. The SVC is patent. Mild right atrial enlargement. Right ventricle is nondilated. Satisfactory opacification of pulmonary arteries noted, and there is no evidence of pulmonary emboli. Patent bilateral pulmonary veins. Scattered coronary calcifications. Adequate contrast opacification of the thoracic aorta with no evidence of dissection, aneurysm, or stenosis. There is classic 3-vessel brachiocephalic arch anatomy without proximal stenosis. A small right pleural effusion has  developed since prior study. No pericardial effusion. 14 x 13 mm retrosternal nodule measures slightly smaller than previous (17 x 13). Some interval increase in soft tissue attenuation consolidation in the right suprahilar region encasing proximal right upper lobe bronchi without narrowing or occlusion. Peripheral pleural-based consolidation in anterior and posterior segments of the right upper lobe and in superior segment right lower lobe, progressive since previous study. Minimal dependent atelectasis posteriorly in the lower lobes right greater than left. Small subpleural blebs in both  lung apices. Thoracic spine and sternum intact. Visualized portions of upper abdomen unremarkable. Review of the MIP images confirms the above findings. IMPRESSION: 1. Negative for acute PE or thoracic aortic dissection. 2. Worsening consolidative opacities in the right upper lobe and right suprahilar region. I do not see a a record of interval radiation therapy to this region, suggesting progression of disease, less likely superimposed multifocal pneumonia. Follow-up recommended. 3. New small right pleural effusion. Electronically Signed   By: Lucrezia Europe M.D.   On: 03/24/2015 14:05   US Abdomen Limited Ruq  03/24/2015  CLINICAL DATA:  Right upper quadrant abdominal pain for 1 week. EXAM: US ABDOMEN LIMITED - RIGHT UPPER QUADRANT COMPARISON:  None. FINDINGS: Gallbladder: No gallstones or wall thickening visualized. No sonographic Murphy sign noted by sonographer. Common bile duct: Diameter: 4 mm, within normal limits. Liver: No focal lesion identified. Within normal limits in parenchymal echogenicity. IMPRESSION: Negative. No gallstones or other hepatobiliary abnormality visualized. Electronically Signed   By: Earle Gell M.D.   On: 03/24/2015 11:39   I have personally reviewed and evaluated these images and lab results as part of my medical decision-making.   EKG Interpretation None      Filed Vitals:   03/24/15 1005  03/24/15 1007 03/24/15 1436  BP: 123/68  118/69  Pulse: 80  78  Temp: 98.5 F (36.9 C)  97.4 F (36.3 C)  TempSrc: Oral  Oral  Resp: 20  14  Height: 5' 7"  (1.702 m)    Weight:  100.415 kg   SpO2: 100%  98%   Meds given in ED:  Medications  oxyCODONE-acetaminophen (PERCOCET/ROXICET) 5-325 MG per tablet 1 tablet (1 tablet Oral Given 03/24/15 1050)  iohexol (OMNIPAQUE) 350 MG/ML injection 80 mL (80 mLs Intravenous Contrast Given 03/24/15 1332)    Discharge Medication List as of 03/24/2015  2:19 PM    START taking these medications   Details  levofloxacin (LEVAQUIN) 750 MG tablet Take 1 tablet (750 mg total) by mouth daily., Starting 03/24/2015, Until Discontinued, Print         MDM  TEALA DAFFRON is a 45 y.o. female with history of metastatic lung cancer, brain tumor, comes in for evaluation of right-sided rib pain versus right upper quadrant pain. Symptoms have been ongoing for 2 weeks, not relieved with anti-inflammatories, cough and pain medicines. Pain will sometimes wake her up at night. We'll obtain right upper quadrant ultrasound as well as CMP evaluate for possible liver pathology. Also, due to patient's history of cancer, d-dimer obtained for evaluation of PE. Labs are grossly unremarkable, right upper quadrant ultrasound also within normal limits. Upon reevaluation, patient is resting comfortably in no apparent distress. CT angiogram of chest is negative for any acute pulmonary embolus or aortic dissection. There are worsening consolidative opacities and right upper lobe and right suprahilar region. Question progressive disease, less likely superimposed pneumonia. Given patient's risk factors, however, will treat for possible pneumonia with Levaquin. Discussed follow-up with oncologist tomorrow. Patient verbalizes understanding and agrees with this plan. Overall appears well, nontoxic, hemodynamically stable and afebrile and appropriate for discharge. Prior to patient  discharge, I discussed and reviewed this case with Dr. Thielen Singer   I personally performed the services described in this documentation, which was scribed in my presence. The recorded information has been reviewed and is accurate.    Comer Locket, PA-C 03/25/15 3762  Virgel Manifold, MD 04/04/15 1320

## 2015-03-24 NOTE — ED Notes (Signed)
Declined W/C at D/C and was escorted to lobby by RN. 

## 2015-03-24 NOTE — ED Notes (Signed)
Pt reports that about 1 week ago her grandson was trying to massage her right side but was actually standing on her with his feet. Pt c/o pain to entire right side since. Pt seen here and at [facility] for same. Pt tried tylenol, goody powder, and ibuprofen without relief.

## 2015-03-25 ENCOUNTER — Emergency Department (HOSPITAL_COMMUNITY): Payer: Medicaid Other

## 2015-03-25 ENCOUNTER — Inpatient Hospital Stay (HOSPITAL_COMMUNITY)
Admission: EM | Admit: 2015-03-25 | Discharge: 2015-04-01 | DRG: 208 | Disposition: A | Discharge status: Home Health | Payer: Medicaid Other | Attending: Internal Medicine | Admitting: Internal Medicine

## 2015-03-25 DIAGNOSIS — S0083XA Contusion of other part of head, initial encounter: Secondary | ICD-10-CM | POA: Diagnosis present

## 2015-03-25 DIAGNOSIS — R451 Restlessness and agitation: Secondary | ICD-10-CM | POA: Diagnosis not present

## 2015-03-25 DIAGNOSIS — Z85118 Personal history of other malignant neoplasm of bronchus and lung: Secondary | ICD-10-CM

## 2015-03-25 DIAGNOSIS — Z978 Presence of other specified devices: Secondary | ICD-10-CM

## 2015-03-25 DIAGNOSIS — R4189 Other symptoms and signs involving cognitive functions and awareness: Secondary | ICD-10-CM

## 2015-03-25 DIAGNOSIS — R569 Unspecified convulsions: Secondary | ICD-10-CM | POA: Diagnosis present

## 2015-03-25 DIAGNOSIS — I10 Essential (primary) hypertension: Secondary | ICD-10-CM | POA: Diagnosis present

## 2015-03-25 DIAGNOSIS — C7931 Secondary malignant neoplasm of brain: Secondary | ICD-10-CM | POA: Diagnosis present

## 2015-03-25 DIAGNOSIS — J9600 Acute respiratory failure, unspecified whether with hypoxia or hypercapnia: Secondary | ICD-10-CM

## 2015-03-25 DIAGNOSIS — Z923 Personal history of irradiation: Secondary | ICD-10-CM

## 2015-03-25 DIAGNOSIS — N179 Acute kidney failure, unspecified: Secondary | ICD-10-CM | POA: Diagnosis present

## 2015-03-25 DIAGNOSIS — K5900 Constipation, unspecified: Secondary | ICD-10-CM | POA: Diagnosis not present

## 2015-03-25 DIAGNOSIS — G9340 Encephalopathy, unspecified: Secondary | ICD-10-CM

## 2015-03-25 DIAGNOSIS — W19XXXA Unspecified fall, initial encounter: Secondary | ICD-10-CM | POA: Diagnosis present

## 2015-03-25 DIAGNOSIS — D649 Anemia, unspecified: Secondary | ICD-10-CM | POA: Diagnosis present

## 2015-03-25 DIAGNOSIS — E875 Hyperkalemia: Secondary | ICD-10-CM | POA: Diagnosis not present

## 2015-03-25 DIAGNOSIS — R4182 Altered mental status, unspecified: Secondary | ICD-10-CM | POA: Diagnosis present

## 2015-03-25 DIAGNOSIS — F17210 Nicotine dependence, cigarettes, uncomplicated: Secondary | ICD-10-CM | POA: Diagnosis present

## 2015-03-25 DIAGNOSIS — J189 Pneumonia, unspecified organism: Secondary | ICD-10-CM | POA: Diagnosis present

## 2015-03-25 DIAGNOSIS — R140 Abdominal distension (gaseous): Secondary | ICD-10-CM | POA: Diagnosis not present

## 2015-03-25 DIAGNOSIS — Z7952 Long term (current) use of systemic steroids: Secondary | ICD-10-CM

## 2015-03-25 DIAGNOSIS — R531 Weakness: Secondary | ICD-10-CM | POA: Insufficient documentation

## 2015-03-25 DIAGNOSIS — Z7189 Other specified counseling: Secondary | ICD-10-CM

## 2015-03-25 DIAGNOSIS — R739 Hyperglycemia, unspecified: Secondary | ICD-10-CM | POA: Diagnosis present

## 2015-03-25 DIAGNOSIS — E872 Acidosis: Secondary | ICD-10-CM | POA: Diagnosis present

## 2015-03-25 DIAGNOSIS — J9602 Acute respiratory failure with hypercapnia: Principal | ICD-10-CM | POA: Diagnosis present

## 2015-03-25 LAB — CBC WITH DIFFERENTIAL/PLATELET
BASOS PCT: 0 %
Basophils Absolute: 0 10*3/uL (ref 0.0–0.1)
EOS PCT: 0 %
Eosinophils Absolute: 0 10*3/uL (ref 0.0–0.7)
HEMATOCRIT: 40.1 % (ref 36.0–46.0)
HEMOGLOBIN: 13.1 g/dL (ref 12.0–15.0)
LYMPHS PCT: 27 %
Lymphs Abs: 4 10*3/uL (ref 0.7–4.0)
MCH: 30.5 pg (ref 26.0–34.0)
MCHC: 32.7 g/dL (ref 30.0–36.0)
MCV: 93.3 fL (ref 78.0–100.0)
Monocytes Absolute: 1.3 10*3/uL — ABNORMAL HIGH (ref 0.1–1.0)
Monocytes Relative: 9 %
NEUTROS ABS: 9.4 10*3/uL — AB (ref 1.7–7.7)
Neutrophils Relative %: 64 %
Platelets: 278 10*3/uL (ref 150–400)
RBC: 4.3 MIL/uL (ref 3.87–5.11)
RDW: 16.5 % — ABNORMAL HIGH (ref 11.5–15.5)
WBC: 14.7 10*3/uL — ABNORMAL HIGH (ref 4.0–10.5)

## 2015-03-25 LAB — I-STAT CHEM 8, ED
BUN: 19 mg/dL (ref 6–20)
CALCIUM ION: 1.12 mmol/L (ref 1.12–1.23)
CHLORIDE: 103 mmol/L (ref 101–111)
Creatinine, Ser: 1.5 mg/dL — ABNORMAL HIGH (ref 0.44–1.00)
GLUCOSE: 225 mg/dL — AB (ref 65–99)
HCT: 44 % (ref 36.0–46.0)
Hemoglobin: 15 g/dL (ref 12.0–15.0)
Potassium: 4.9 mmol/L (ref 3.5–5.1)
Sodium: 135 mmol/L (ref 135–145)
TCO2: 13 mmol/L (ref 0–100)

## 2015-03-25 LAB — I-STAT TROPONIN, ED
Troponin i, poc: 0.06 ng/mL (ref 0.00–0.08)
Troponin i, poc: 0.16 ng/mL (ref 0.00–0.08)

## 2015-03-25 LAB — I-STAT ARTERIAL BLOOD GAS, ED
Acid-base deficit: 21 mmol/L — ABNORMAL HIGH (ref 0.0–2.0)
Acid-base deficit: 8 mmol/L — ABNORMAL HIGH (ref 0.0–2.0)
BICARBONATE: 12.6 meq/L — AB (ref 20.0–24.0)
BICARBONATE: 19.2 meq/L — AB (ref 20.0–24.0)
O2 SAT: 92 %
O2 Saturation: 92 %
PO2 ART: 76 mmHg — AB (ref 80.0–100.0)
Patient temperature: 98.6
TCO2: 15 mmol/L (ref 0–100)
TCO2: 21 mmol/L (ref 0–100)
pCO2 arterial: 46.8 mmHg — ABNORMAL HIGH (ref 35.0–45.0)
pCO2 arterial: 63.7 mmHg (ref 35.0–45.0)
pH, Arterial: 6.904 — CL (ref 7.350–7.450)
pH, Arterial: 7.223 — ABNORMAL LOW (ref 7.350–7.450)
pO2, Arterial: 106 mmHg — ABNORMAL HIGH (ref 80.0–100.0)

## 2015-03-25 LAB — COMPREHENSIVE METABOLIC PANEL
ALBUMIN: 3.4 g/dL — AB (ref 3.5–5.0)
ALK PHOS: 101 U/L (ref 38–126)
ALT: 46 U/L (ref 14–54)
ANION GAP: 24 — AB (ref 5–15)
AST: 59 U/L — ABNORMAL HIGH (ref 15–41)
BILIRUBIN TOTAL: 0.4 mg/dL (ref 0.3–1.2)
BUN: 18 mg/dL (ref 6–20)
CALCIUM: 9 mg/dL (ref 8.9–10.3)
CO2: 11 mmol/L — ABNORMAL LOW (ref 22–32)
Chloride: 100 mmol/L — ABNORMAL LOW (ref 101–111)
Creatinine, Ser: 1.68 mg/dL — ABNORMAL HIGH (ref 0.44–1.00)
GFR calc non Af Amer: 36 mL/min — ABNORMAL LOW (ref >60)
GFR, EST AFRICAN AMERICAN: 41 mL/min — AB (ref >60)
GLUCOSE: 231 mg/dL — AB (ref 65–99)
Potassium: 4.9 mmol/L (ref 3.5–5.1)
Sodium: 135 mmol/L (ref 135–145)
TOTAL PROTEIN: 7 g/dL (ref 6.5–8.1)

## 2015-03-25 LAB — PROTIME-INR
INR: 1.22 (ref 0.00–1.49)
Prothrombin Time: 15.5 seconds — ABNORMAL HIGH (ref 11.6–15.2)

## 2015-03-25 LAB — MAGNESIUM: Magnesium: 2.5 mg/dL — ABNORMAL HIGH (ref 1.7–2.4)

## 2015-03-25 LAB — I-STAT CG4 LACTIC ACID, ED
Lactic Acid, Venous: 7.09 mmol/L (ref 0.5–2.0)
Lactic Acid, Venous: >17 mmol/L (ref 0.5–2.0)

## 2015-03-25 MED ORDER — SODIUM CHLORIDE 0.9 % IV BOLUS (SEPSIS)
1000.0000 mL | Freq: Once | INTRAVENOUS | Status: AC
  Administered 2015-03-25: 1000 mL via INTRAVENOUS

## 2015-03-25 MED ORDER — PIPERACILLIN-TAZOBACTAM 3.375 G IVPB 30 MIN
3.3750 g | Freq: Once | INTRAVENOUS | Status: AC
  Administered 2015-03-25: 3.375 g via INTRAVENOUS
  Filled 2015-03-25: qty 50

## 2015-03-25 MED ORDER — PROPOFOL 10 MG/ML IV BOLUS
INTRAVENOUS | Status: AC
  Filled 2015-03-25: qty 20

## 2015-03-25 MED ORDER — PROPOFOL 1000 MG/100ML IV EMUL
INTRAVENOUS | Status: AC
  Administered 2015-03-26: 1000 mg
  Filled 2015-03-25: qty 100

## 2015-03-25 MED ORDER — PROPOFOL 1000 MG/100ML IV EMUL
5.0000 ug/kg/min | Freq: Once | INTRAVENOUS | Status: AC
  Administered 2015-03-25: 20 ug/kg/min via INTRAVENOUS

## 2015-03-25 MED ORDER — PROPOFOL 10 MG/ML IV BOLUS
INTRAVENOUS | Status: DC | PRN
  Administered 2015-03-25: 100 ug via INTRAVENOUS
  Administered 2015-03-25: 50 ug via INTRAVENOUS

## 2015-03-25 MED ORDER — ROCURONIUM BROMIDE 50 MG/5ML IV SOLN
INTRAVENOUS | Status: DC | PRN
  Administered 2015-03-25: 100 mg via INTRAVENOUS

## 2015-03-25 MED ORDER — SUCCINYLCHOLINE CHLORIDE 20 MG/ML IJ SOLN
INTRAMUSCULAR | Status: DC | PRN
  Administered 2015-03-25: 100 mg via INTRAVENOUS

## 2015-03-25 MED ORDER — VANCOMYCIN HCL 10 G IV SOLR
2000.0000 mg | Freq: Once | INTRAVENOUS | Status: AC
  Administered 2015-03-25: 2000 mg via INTRAVENOUS
  Filled 2015-03-25: qty 2000

## 2015-03-25 NOTE — ED Notes (Signed)
Pt arrives via EMS from home. Family came home and found pt unresponsive on the floor. Called EMS. EMS found pt to be seizing, left sided gaze, hematoma to right forehead, pupils fixed dilated and unreactive. No hx of seizures. No known LSN. EMS gave 7.5 versed and 2 narcan with no relief. Pt still breakthrough seizing. EMS reports that pt aspirated blood while seizing and they suctioned 200 ml.

## 2015-03-25 NOTE — ED Notes (Signed)
Family at bedside. 

## 2015-03-25 NOTE — Progress Notes (Signed)
Changes made per ABG and MD verbal order.

## 2015-03-25 NOTE — Progress Notes (Signed)
Changes per ABG and MD verbal order.

## 2015-03-25 NOTE — Progress Notes (Signed)
Pt transported to/from CT without event.

## 2015-03-25 NOTE — H&P (Signed)
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 She was seen by radiation oncology on 03/22/15 and dexamethasone was discontinued with plans for repeat MRI in one month. On 1/14, she presented to the ED with complaints of right-sided chest pain and non-productive cough. A CTA PE protocol was obtained and negative for PE but did show some right-sided infiltrates. She was sent home with a course of levaquin.   This evening (03/25/15), her family returned home and found her unresponsive on the floor. EMS was called and found her to be seizing with "left-sided gaze, fixed and dilated pupils and a hematoma on her right forehead". They gave a total of 7.'5mg'$  versed and 2 of narcan with no change in her status. On arrival to the ED she was intubated for airway protection. Bloody secretions were noted in the oral cavity. Per report she did withdraw to noxious stimuli after arrival to the ED.   Her family (3 sons, husband, son-in-law) report that she has been recovering from a cold, but otherwise had been doing well with no fevers / chills / nausea / vomiting / diarrhea. They are unclear about a prior seizure history - her husband thinks maybe he has seen one before, but it was a long time ago.  They report that from a cancer standpoint they have understand that there is "nothing active" right now.   PAST MEDICAL HISTORY :  She  has a past medical history of Thyroid nodule; Depression; Suicide attempt [facility]); SVT (supraventricular tachycardia) ([place]); Metastatic lung cancer (metastasis from lung to other site) [facility]) (06/2014); Brain metastases ([place]); and  Hypertension.  PAST SURGICAL HISTORY: She  has past surgical history that includes Thyroid surgery; supraventricular tachycardia ablation (N/A, 01/16/2014); and TEE without cardioversion (N/A, 06/12/2014).  No Known Allergies  No current facility-administered medications on file prior to encounter.   Current Outpatient Prescriptions on File Prior to Encounter  Medication Sig  . acetaminophen (TYLENOL) 500 MG tablet Take 1,000 mg by mouth every 6 (six) hours as needed (pain). Reported on 03/22/2015  . azithromycin (ZITHROMAX Z-PAK) 250 MG tablet Take as directed (Patient not taking: Reported on 03/22/2015)  . buPROPion (WELLBUTRIN SR) 150 MG 12 hr tablet Take 1 tablet (150 mg total) by mouth 2 (two) times daily.  . clonazePAM (KLONOPIN) 0.5 MG tablet Take 1 tablet (0.5 mg total) by mouth 2 (two) times daily as needed for anxiety. (Patient not taking: Reported on 03/22/2015)  . dexamethasone (DECADRON) 2 MG tablet   . dexamethasone (DECADRON) 4 MG tablet Take 1 tablet (4 mg total) by mouth 2 (two) times daily. (Patient not taking: Reported on 03/22/2015)  . dextromethorphan 15 MG/5ML syrup Take 10 mLs (30 mg total) by mouth 4 (four) times daily as needed for cough. (Patient not taking: Reported on 03/22/2015)  . folic acid (FOLVITE) 1 MG tablet Take 1 tablet (1 mg total) by mouth daily. Start 5-7 days before Alimta chemotherapy. Continue until 21 days after Alimta completed. (Patient not taking: Reported on 03/22/2015)  . guaiFENesin-codeine 100-10 MG/5ML syrup Take 10 mLs by mouth 3 (three) times daily as needed for  cough. (Patient not taking: Reported on 03/22/2015)  . HYDROcodone-acetaminophen (NORCO/VICODIN) 5-325 MG tablet Take 1-2 tablets by mouth every 4 (four) hours as needed.  . hydrOXYzine (VISTARIL) 25 MG capsule Take 1 capsule (25 mg total) by mouth 3 (three) times daily as needed for itching or anxiety. (Patient not taking: Reported on 01/02/2015)  . ibuprofen (ADVIL,MOTRIN) 200 MG tablet Take  400 mg by mouth every 6 (six) hours as needed. Reported on 03/22/2015  . levofloxacin (LEVAQUIN) 750 MG tablet Take 1 tablet (750 mg total) by mouth daily.  [name] lidocaine (XYLOCAINE) 2 % solution Swallow 5 ml 15 minutes before meals (Patient not taking: Reported on 01/02/2015)  . lisinopril-hydrochlorothiazide (PRINZIDE,ZESTORETIC) 10-12.5 MG per tablet Take 1 tablet by mouth daily. Reported on 03/22/2015  . LORazepam (ATIVAN) 1 MG tablet Reported on 03/22/2015  . mirtazapine (REMERON) 30 MG tablet Take 30 mg by mouth at bedtime. Reported on 03/22/2015  . NICOTROL 10 MG inhaler Reported on 03/22/2015  . Olopatadine HCl (PAZEO) 0.7 % SOLN Reported on 03/22/2015  . ondansetron (ZOFRAN) 8 MG tablet Take 1 tablet (8 mg total) by mouth 2 (two) times daily. Start the day after chemo for 2 days. Then take as needed for nausea and vomiting. (Patient not taking: Reported on 03/22/2015)  . pantoprazole (PROTONIX) 40 MG tablet Take 40 mg by mouth 2 (two) times daily before a meal. Reported on 03/22/2015  . sucralfate (CARAFATE) 1 GM/10ML suspension Take 10 mLs (1 g total) by mouth 4 (four) times daily -  with meals and at bedtime. (Patient not taking: Reported on 01/02/2015)    FAMILY HISTORY:  Her has no family status information on file.   SOCIAL HISTORY: She  reports that she has been smoking Cigarettes.  She has a 30 pack-year smoking history. She uses smokeless tobacco. She reports that she does not drink alcohol or use illicit drugs.  REVIEW OF SYSTEMS:   Unable to obtain 2/2 sedation / intubated.   SUBJECTIVE:  n/a  VITAL SIGNS: BP 131/80 mmHg  Pulse 148  Resp 30  SpO2 97%  LMP 03/16/2015  HEMODYNAMICS:    VENTILATOR SETTINGS: Vent Mode:  [-] PRVC FiO2 (%):  [100 %] 100 % Set Rate:  [14 bmp-30 bmp] 30 bmp Vt Set:  [500 mL-530 mL] 530 mL PEEP:  [5 cmH20-8 cmH20] 8 cmH20 Plateau Pressure:  [25 OIN86-76 cmH20] 27 cmH20  INTAKE / OUTPUT:    PHYSICAL EXAMINATION: General:  wnwd obese AAF,  sedated, intubated with occasional spontaneous movements Neuro:  Awake, opens eyes to stimuli, minimal withdrawal to noxious stimuli, not following commands.  HEENT:  Large hematoma on forehead with swelling around both orbits, abrasion on face, bloody oral secretions, ETT in place, OGT in place, cervical collar in place.  Cardiovascular:  Tachy, regular, s1/s2, no m/r/g, distal pulses palpable, trace bipedal edema Lungs:  ronchorous bilaterally with no wheezes or rales. Vent-assisted. Symmetrical expansion.  Abdomen:  Obese, +bs, soft, no mass.  Musculoskeletal:  No gross deformities. Stigmata from fall.  Skin:  Warm, dry, intact. No rashes or ulcers.   LABS:  BMET  Recent Labs Lab 03/24/15 1043 03/25/15 2223 03/25/15 2227  NA 140 135 135  K 4.4 4.9 4.9  CL 103 100* 103  CO2 28 11*  --   BUN '13 18 19  '$ CREATININE 0.78 1.68* 1.50*  GLUCOSE 93 231* 225*    Electrolytes  Recent Labs Lab 03/24/15 1043 03/25/15 2223  CALCIUM 9.2 9.0  MG  --  2.5*    CBC  Recent Labs Lab 03/24/15 1043 03/25/15 2223 03/25/15 2227  WBC 9.1 14.7*  --   HGB 12.6 13.1 15.0  HCT 37.8 40.1 44.0  PLT 281 278  --     Coag's  Recent Labs Lab 03/25/15 2223  INR 1.22    Sepsis Markers  Recent Labs Lab 03/25/15 2228  LATICACIDVEN >17.00*    ABG  Recent Labs Lab 03/25/15 2244 03/25/15 2340  PHART 6.904* 7.223*  PCO2ART 63.7* 46.8*  PO2ART 106.0* 76.0*    Liver Enzymes  Recent Labs Lab 03/24/15 1043 03/25/15 2223  AST 22 59*  ALT 31 46  ALKPHOS 73 101  BILITOT 0.5 0.4  ALBUMIN 3.5 3.4*    Cardiac Enzymes No results for input(s): TROPONINI, PROBNP in the last 168 hours.  Glucose No results for input(s): GLUCAP in the last 168 hours.  Imaging Dg Chest Port 1 View  03/25/2015  CLINICAL DATA:  Endotracheal tube placement.  Initial encounter. EXAM: PORTABLE CHEST 1 VIEW COMPARISON:  Chest radiograph performed 03/11/2015, and CTA of the chest performed 03/24/2015  FINDINGS: The patient's endotracheal tube is seen ending 2-3 cm above the carina. There is distention of the proximal trachea, thought to reflect mild overinflation of the endotracheal tube balloon. An enteric tube is noted extending below the diaphragm. Dense right upper lobe airspace opacification is noted, and bilateral perihilar airspace opacity, significantly worsened from the recent prior study and concerning for multifocal pneumonia. A small right pleural effusion is noted. No definite pneumothorax is seen. The cardiomediastinal silhouette is borderline normal in size. No acute osseous abnormalities are identified. IMPRESSION: 1. Endotracheal tube seen ending 2-3 cm above the carina. Distention of the proximal trachea is thought to reflect mild overinflation of the endotracheal tube balloon. This should be slightly decompressed. 2. Significantly worsened dense right upper lobe airspace opacification and bilateral perihilar opacity, concerning for worsening multifocal pneumonia. Small right pleural effusion noted. These results were called by telephone at the time of interpretation on 03/25/2015 at 11:34 pm to Dr. Carmin Muskrat, who verbally acknowledged these results. Electronically Signed   By: Garald Balding M.D.   On: 03/25/2015 23:35     STUDIES:  As above CT head, CT c-spine reads pending MRI pending  CULTURES: Blood cx 03/25/15 >> Urine cx 03/25/15 >>  ANTIBIOTICS: Vanc 03/25/15 >> Zosyn 03/25/15 >>  SIGNIFICANT EVENTS: n/a  LINES/TUBES: ETT 03/25/15 Foley 03/25/15  DISCUSSION: Mrs. Kraker is an unfortunate 21F w/ stage iv adenoca of lung origin with mets to brain s/p stereotactic radiosurgery to cerebellar lesion in 01/2015 who presents with new onset seizure activity. She was recently started on levaquin for a presumed pneumonia, which may have lowered her seizure threshold. She was unable to protect her airway on arrival to the ED and was intubated. She has been covered broadly  with antibiotics for possible pneumonia and will be loaded with Keppra per neuro-hospitalist.  ASSESSMENT / PLAN:  PULMONARY A: Acute hypercapneic respiratory failure 2/2 seizure with inability to protect airway Presumed aspiration event Recent pneumonia dx Smoking R lung NSCLC  P:   Continue ventilatory support Wean as tolerated DuoNebs prn  Continue antibiotics  Await MRI for further goals of care discussions   CARDIOVASCULAR A:  No acute issues  P:  monitor  RENAL A:   Acute kidney injury - etiology unclear  P:   Trend Cr  Avoid nephrotoxins Pharmacy to assist with dosing   GASTROINTESTINAL A:   No acute issues  P:   Stress ulcer ppx  HEMATOLOGIC A:   No acute issues  P:  Mechanical ppx while still having bloody secretions   INFECTIOUS A:   Concern for post-obstructive pneumonia Probable aspiration  P:   Continue vanc/zosyn for now  ENDOCRINE A:   Hyperglycemia - likely related to seizures  P:   Check A1c,  Accuchecks / SSI  NEUROLOGIC A:   New-onset seizures with altered mental status Hx brain metastases (temporal lobe, cerebellum) s/p radiation therapy Concern for cervical injury during fall  P:   Continue sedation as needed Keppra load Neuro following EEG per neurology (spot vs LTM) MRI w & w/o Maintain C-collar    FAMILY  - Updates: spoke with husband, multiple children and other family regarding current status, workup being pursued and prognosis.  - Inter-disciplinary family meet or Palliative Care meeting due by:  day 7  CRITICAL CARE Performed by: Dannielle Burn, MD   Total critical care time: 57 minutes  Critical care time was exclusive of separately billable procedures and treating other patients.  Critical care was necessary to treat or prevent imminent or life-threatening deterioration.  Critical care was time spent personally by me on the following activities: development of treatment plan with  patient and/or surrogate as well as nursing, discussions with consultants, evaluation of patient's response to treatment, examination of patient, obtaining history from patient or surrogate, ordering and performing treatments and interventions, ordering and review of laboratory studies, ordering and review of radiographic studies, pulse oximetry and re-evaluation of patient's condition.  Yisroel Ramming, MD Pulmonary and [place] Pager: [phone]  03/25/2015, 11:48 PM

## 2015-03-26 ENCOUNTER — Inpatient Hospital Stay (HOSPITAL_COMMUNITY): Payer: Medicaid Other

## 2015-03-26 ENCOUNTER — Emergency Department (HOSPITAL_COMMUNITY): Payer: Medicaid Other

## 2015-03-26 DIAGNOSIS — D649 Anemia, unspecified: Secondary | ICD-10-CM | POA: Diagnosis present

## 2015-03-26 DIAGNOSIS — R140 Abdominal distension (gaseous): Secondary | ICD-10-CM | POA: Diagnosis not present

## 2015-03-26 DIAGNOSIS — J9690 Respiratory failure, unspecified, unspecified whether with hypoxia or hypercapnia: Secondary | ICD-10-CM | POA: Diagnosis not present

## 2015-03-26 DIAGNOSIS — R404 Transient alteration of awareness: Secondary | ICD-10-CM | POA: Diagnosis present

## 2015-03-26 DIAGNOSIS — N179 Acute kidney failure, unspecified: Secondary | ICD-10-CM | POA: Diagnosis present

## 2015-03-26 DIAGNOSIS — Z515 Encounter for palliative care: Secondary | ICD-10-CM | POA: Diagnosis not present

## 2015-03-26 DIAGNOSIS — R451 Restlessness and agitation: Secondary | ICD-10-CM | POA: Diagnosis not present

## 2015-03-26 DIAGNOSIS — J9600 Acute respiratory failure, unspecified whether with hypoxia or hypercapnia: Secondary | ICD-10-CM | POA: Diagnosis not present

## 2015-03-26 DIAGNOSIS — E875 Hyperkalemia: Secondary | ICD-10-CM | POA: Diagnosis not present

## 2015-03-26 DIAGNOSIS — E872 Acidosis: Secondary | ICD-10-CM | POA: Diagnosis present

## 2015-03-26 DIAGNOSIS — F17210 Nicotine dependence, cigarettes, uncomplicated: Secondary | ICD-10-CM | POA: Diagnosis present

## 2015-03-26 DIAGNOSIS — R569 Unspecified convulsions: Secondary | ICD-10-CM | POA: Diagnosis present

## 2015-03-26 DIAGNOSIS — C7931 Secondary malignant neoplasm of brain: Secondary | ICD-10-CM | POA: Diagnosis present

## 2015-03-26 DIAGNOSIS — Z923 Personal history of irradiation: Secondary | ICD-10-CM | POA: Diagnosis not present

## 2015-03-26 DIAGNOSIS — I10 Essential (primary) hypertension: Secondary | ICD-10-CM | POA: Diagnosis present

## 2015-03-26 DIAGNOSIS — R4182 Altered mental status, unspecified: Secondary | ICD-10-CM | POA: Diagnosis present

## 2015-03-26 DIAGNOSIS — J189 Pneumonia, unspecified organism: Secondary | ICD-10-CM | POA: Diagnosis present

## 2015-03-26 DIAGNOSIS — Z85118 Personal history of other malignant neoplasm of bronchus and lung: Secondary | ICD-10-CM | POA: Diagnosis not present

## 2015-03-26 DIAGNOSIS — R400 Somnolence: Secondary | ICD-10-CM | POA: Diagnosis not present

## 2015-03-26 DIAGNOSIS — R739 Hyperglycemia, unspecified: Secondary | ICD-10-CM | POA: Diagnosis present

## 2015-03-26 DIAGNOSIS — G9340 Encephalopathy, unspecified: Secondary | ICD-10-CM | POA: Diagnosis not present

## 2015-03-26 DIAGNOSIS — Z7189 Other specified counseling: Secondary | ICD-10-CM | POA: Diagnosis not present

## 2015-03-26 DIAGNOSIS — K5900 Constipation, unspecified: Secondary | ICD-10-CM | POA: Diagnosis not present

## 2015-03-26 DIAGNOSIS — J9601 Acute respiratory failure with hypoxia: Secondary | ICD-10-CM | POA: Diagnosis not present

## 2015-03-26 DIAGNOSIS — Z7952 Long term (current) use of systemic steroids: Secondary | ICD-10-CM | POA: Diagnosis not present

## 2015-03-26 DIAGNOSIS — C3411 Malignant neoplasm of upper lobe, right bronchus or lung: Secondary | ICD-10-CM | POA: Diagnosis not present

## 2015-03-26 DIAGNOSIS — S0083XA Contusion of other part of head, initial encounter: Secondary | ICD-10-CM | POA: Diagnosis present

## 2015-03-26 DIAGNOSIS — W19XXXA Unspecified fall, initial encounter: Secondary | ICD-10-CM | POA: Diagnosis present

## 2015-03-26 DIAGNOSIS — J9602 Acute respiratory failure with hypercapnia: Secondary | ICD-10-CM | POA: Diagnosis present

## 2015-03-26 LAB — COMPREHENSIVE METABOLIC PANEL
ALT: 55 U/L — AB (ref 14–54)
ANION GAP: 9 (ref 5–15)
AST: 72 U/L — ABNORMAL HIGH (ref 15–41)
Albumin: 3 g/dL — ABNORMAL LOW (ref 3.5–5.0)
Alkaline Phosphatase: 72 U/L (ref 38–126)
BUN: 14 mg/dL (ref 6–20)
CHLORIDE: 109 mmol/L (ref 101–111)
CO2: 22 mmol/L (ref 22–32)
Calcium: 8.1 mg/dL — ABNORMAL LOW (ref 8.9–10.3)
Creatinine, Ser: 1.13 mg/dL — ABNORMAL HIGH (ref 0.44–1.00)
GFR calc non Af Amer: 57 mL/min — ABNORMAL LOW (ref >60)
Glucose, Bld: 98 mg/dL (ref 65–99)
Potassium: 3.9 mmol/L (ref 3.5–5.1)
SODIUM: 140 mmol/L (ref 135–145)
Total Bilirubin: 0.4 mg/dL (ref 0.3–1.2)
Total Protein: 5.9 g/dL — ABNORMAL LOW (ref 6.5–8.1)

## 2015-03-26 LAB — URINALYSIS, ROUTINE W REFLEX MICROSCOPIC
Bilirubin Urine: NEGATIVE
Glucose, UA: NEGATIVE mg/dL
Ketones, ur: NEGATIVE mg/dL
Leukocytes, UA: NEGATIVE
Nitrite: NEGATIVE
Protein, ur: 100 mg/dL — AB
SPECIFIC GRAVITY, URINE: 1.014 (ref 1.005–1.030)
pH: 5 (ref 5.0–8.0)

## 2015-03-26 LAB — TRIGLYCERIDES: Triglycerides: 99 mg/dL (ref <150)

## 2015-03-26 LAB — RAPID URINE DRUG SCREEN, HOSP PERFORMED
Amphetamines: NOT DETECTED
BENZODIAZEPINES: POSITIVE — AB
Barbiturates: NOT DETECTED
COCAINE: NOT DETECTED
Opiates: POSITIVE — AB
Tetrahydrocannabinol: NOT DETECTED

## 2015-03-26 LAB — GLUCOSE, CAPILLARY
GLUCOSE-CAPILLARY: 142 mg/dL — AB (ref 65–99)
GLUCOSE-CAPILLARY: 148 mg/dL — AB (ref 65–99)
GLUCOSE-CAPILLARY: 126 mg/dL — AB (ref 65–99)
Glucose-Capillary: 225 mg/dL — ABNORMAL HIGH (ref 65–99)
Glucose-Capillary: 102 mg/dL — ABNORMAL HIGH (ref 65–99)
Glucose-Capillary: 112 mg/dL — ABNORMAL HIGH (ref 65–99)
Glucose-Capillary: 128 mg/dL — ABNORMAL HIGH (ref 65–99)

## 2015-03-26 LAB — BLOOD GAS, ARTERIAL
Acid-base deficit: 2.9 mmol/L — ABNORMAL HIGH (ref 0.0–2.0)
BICARBONATE: 19.4 meq/L — AB (ref 20.0–24.0)
Drawn by: 448981
FIO2: 0.7
LHR: 30 {breaths}/min
MECHVT: 530 mL
O2 Saturation: 99.7 %
PCO2 ART: 22.9 mmHg — AB (ref 35.0–45.0)
PEEP: 8 cmH2O
PO2 ART: 328 mmHg — AB (ref 80.0–100.0)
Patient temperature: 98.6
TCO2: 20.1 mmol/L (ref 0–100)
pH, Arterial: 7.538 — ABNORMAL HIGH (ref 7.350–7.450)

## 2015-03-26 LAB — MAGNESIUM: Magnesium: 2.3 mg/dL (ref 1.7–2.4)

## 2015-03-26 LAB — URINE MICROSCOPIC-ADD ON

## 2015-03-26 LAB — T4, FREE: FREE T4: 0.78 ng/dL (ref 0.61–1.12)

## 2015-03-26 LAB — PHOSPHORUS: PHOSPHORUS: 3.5 mg/dL (ref 2.5–4.6)

## 2015-03-26 LAB — TSH: TSH: 4.014 u[IU]/mL (ref 0.350–4.500)

## 2015-03-26 LAB — LACTIC ACID, PLASMA: LACTIC ACID, VENOUS: 2.5 mmol/L — AB (ref 0.5–2.0)

## 2015-03-26 MED ORDER — FAMOTIDINE 40 MG/5ML PO SUSR
20.0000 mg | Freq: Two times a day (BID) | ORAL | Status: DC
  Administered 2015-03-26: 20 mg
  Filled 2015-03-26 (×2): qty 2.5

## 2015-03-26 MED ORDER — IPRATROPIUM-ALBUTEROL 0.5-2.5 (3) MG/3ML IN SOLN
3.0000 mL | Freq: Four times a day (QID) | RESPIRATORY_TRACT | Status: DC
  Administered 2015-03-26 – 2015-03-31 (×23): 3 mL via RESPIRATORY_TRACT
  Filled 2015-03-26 (×23): qty 3

## 2015-03-26 MED ORDER — PIPERACILLIN-TAZOBACTAM 3.375 G IVPB
3.3750 g | Freq: Three times a day (TID) | INTRAVENOUS | Status: DC
  Administered 2015-03-26 – 2015-03-30 (×13): 3.375 g via INTRAVENOUS
  Filled 2015-03-26 (×15): qty 50

## 2015-03-26 MED ORDER — IPRATROPIUM-ALBUTEROL 0.5-2.5 (3) MG/3ML IN SOLN: 3.0000 mL | RESPIRATORY_TRACT | Status: DC | PRN

## 2015-03-26 MED ORDER — FAMOTIDINE 40 MG/5ML PO SUSR
40.0000 mg | Freq: Two times a day (BID) | ORAL | Status: DC
  Administered 2015-03-26 – 2015-03-29 (×7): 40 mg
  Filled 2015-03-26 (×9): qty 5

## 2015-03-26 MED ORDER — SODIUM CHLORIDE 0.9 % IV SOLN
500.0000 mg | Freq: Two times a day (BID) | INTRAVENOUS | Status: DC
  Administered 2015-03-26 – 2015-04-01 (×12): 500 mg via INTRAVENOUS
  Filled 2015-03-26 (×13): qty 5

## 2015-03-26 MED ORDER — INSULIN ASPART 100 UNIT/ML ~~LOC~~ SOLN
2.0000 [IU] | SUBCUTANEOUS | Status: DC
  Administered 2015-03-26 – 2015-03-30 (×18): 2 [IU] via SUBCUTANEOUS
  Administered 2015-03-30: 4 [IU] via SUBCUTANEOUS
  Administered 2015-03-31 – 2015-04-01 (×4): 2 [IU] via SUBCUTANEOUS
  Administered 2015-04-01: 4 [IU] via SUBCUTANEOUS

## 2015-03-26 MED ORDER — ANTISEPTIC ORAL RINSE SOLUTION (CORINZ)
7.0000 mL | Freq: Four times a day (QID) | OROMUCOSAL | Status: DC
  Administered 2015-03-26 – 2015-03-29 (×14): 7 mL via OROMUCOSAL

## 2015-03-26 MED ORDER — DEXAMETHASONE SODIUM PHOSPHATE 4 MG/ML IJ SOLN
4.0000 mg | Freq: Four times a day (QID) | INTRAMUSCULAR | Status: DC
  Administered 2015-03-26 – 2015-04-01 (×26): 4 mg via INTRAVENOUS
  Filled 2015-03-26 (×29): qty 1

## 2015-03-26 MED ORDER — MIDAZOLAM HCL 2 MG/2ML IJ SOLN
2.0000 mg | INTRAMUSCULAR | Status: DC | PRN
  Administered 2015-03-28: 2 mg via INTRAVENOUS
  Filled 2015-03-26: qty 2

## 2015-03-26 MED ORDER — SODIUM CHLORIDE 0.9 % IV SOLN
1000.0000 mg | Freq: Once | INTRAVENOUS | Status: AC
  Administered 2015-03-26: 1000 mg via INTRAVENOUS
  Filled 2015-03-26: qty 10

## 2015-03-26 MED ORDER — MIDAZOLAM HCL 2 MG/2ML IJ SOLN: 2.0000 mg | INTRAMUSCULAR | Status: DC | PRN

## 2015-03-26 MED ORDER — VANCOMYCIN HCL 10 G IV SOLR: 1250.0000 mg | INTRAVENOUS | Status: DC

## 2015-03-26 MED ORDER — VANCOMYCIN HCL IN DEXTROSE 750-5 MG/150ML-% IV SOLN
750.0000 mg | Freq: Two times a day (BID) | INTRAVENOUS | Status: DC
  Administered 2015-03-26 – 2015-03-28 (×4): 750 mg via INTRAVENOUS
  Filled 2015-03-26 (×5): qty 150

## 2015-03-26 MED ORDER — PROPOFOL 1000 MG/100ML IV EMUL
5.0000 ug/kg/min | INTRAVENOUS | Status: DC
  Administered 2015-03-26: 30 ug/kg/min via INTRAVENOUS
  Administered 2015-03-26 (×2): 70 ug/kg/min via INTRAVENOUS
  Administered 2015-03-26: 40 ug/kg/min via INTRAVENOUS
  Administered 2015-03-26: 50 ug/kg/min via INTRAVENOUS
  Administered 2015-03-27: 25 ug/kg/min via INTRAVENOUS
  Administered 2015-03-27 (×2): 70 ug/kg/min via INTRAVENOUS
  Administered 2015-03-27: 40 ug/kg/min via INTRAVENOUS
  Administered 2015-03-27: 60 ug/kg/min via INTRAVENOUS
  Administered 2015-03-27: 35 ug/kg/min via INTRAVENOUS
  Administered 2015-03-28: 50 ug/kg/min via INTRAVENOUS
  Administered 2015-03-28: 70 ug/kg/min via INTRAVENOUS
  Administered 2015-03-28: 40 ug/kg/min via INTRAVENOUS
  Administered 2015-03-28: 60 ug/kg/min via INTRAVENOUS
  Administered 2015-03-28: 50 ug/kg/min via INTRAVENOUS
  Administered 2015-03-28: 60 ug/kg/min via INTRAVENOUS
  Administered 2015-03-28 – 2015-03-29 (×3): 50 ug/kg/min via INTRAVENOUS
  Administered 2015-03-29: 60 ug/kg/min via INTRAVENOUS
  Filled 2015-03-26 (×6): qty 100
  Filled 2015-03-26: qty 200
  Filled 2015-03-26 (×2): qty 100
  Filled 2015-03-26: qty 200
  Filled 2015-03-26 (×2): qty 100
  Filled 2015-03-26: qty 200
  Filled 2015-03-26: qty 100
  Filled 2015-03-26: qty 200
  Filled 2015-03-26 (×3): qty 100

## 2015-03-26 MED ORDER — SODIUM CHLORIDE 0.9 % IV SOLN: 250.0000 mL | INTRAVENOUS | Status: DC | PRN

## 2015-03-26 MED ORDER — FENTANYL BOLUS VIA INFUSION: 50.0000 ug | INTRAVENOUS | Status: DC | PRN

## 2015-03-26 MED ORDER — PANTOPRAZOLE SODIUM 40 MG IV SOLR: 40.0000 mg | INTRAVENOUS | Status: DC

## 2015-03-26 MED ORDER — FENTANYL CITRATE (PF) 100 MCG/2ML IJ SOLN
25.0000 ug | INTRAMUSCULAR | Status: DC | PRN
  Administered 2015-03-26: 50 ug via INTRAVENOUS
  Filled 2015-03-26: qty 2

## 2015-03-26 MED ORDER — PROPOFOL 1000 MG/100ML IV EMUL
INTRAVENOUS | Status: AC
  Administered 2015-03-26: 60 mg
  Filled 2015-03-26: qty 100

## 2015-03-26 MED ORDER — ENOXAPARIN SODIUM 40 MG/0.4ML ~~LOC~~ SOLN
40.0000 mg | SUBCUTANEOUS | Status: DC
  Administered 2015-03-26: 40 mg via SUBCUTANEOUS
  Filled 2015-03-26 (×2): qty 0.4

## 2015-03-26 MED ORDER — CHLORHEXIDINE GLUCONATE 0.12% ORAL RINSE (MEDLINE KIT)
15.0000 mL | Freq: Two times a day (BID) | OROMUCOSAL | Status: DC
  Administered 2015-03-26 – 2015-03-29 (×7): 15 mL via OROMUCOSAL

## 2015-03-26 MED ORDER — FENTANYL CITRATE (PF) 100 MCG/2ML IJ SOLN: 50.0000 ug | Freq: Once | INTRAMUSCULAR | Status: DC

## 2015-03-26 MED ORDER — SODIUM CHLORIDE 0.9 % IV SOLN
25.0000 ug/h | INTRAVENOUS | Status: DC
  Administered 2015-03-26: 100 ug/h via INTRAVENOUS
  Administered 2015-03-27: 300 ug/h via INTRAVENOUS
  Administered 2015-03-27: 150 ug/h via INTRAVENOUS
  Administered 2015-03-28 (×2): 250 ug/h via INTRAVENOUS
  Administered 2015-03-29: 300 ug/h via INTRAVENOUS
  Filled 2015-03-26 (×8): qty 50

## 2015-03-26 NOTE — Progress Notes (Signed)
Initial Nutrition Assessment  DOCUMENTATION CODES:   Obesity unspecified  INTERVENTION:  -RD to continue to monitor for needs -If unable to extubate, rec Vital High Protein at 79m/hr, Prostat [id number] Tubefeeding regimen + Propofol @ 3027mr (792 fat calories) provides 1732 calories (113% of needs - 16cal/kg ABW) 126g protein, 200m32mee h2o  NUTRITION DIAGNOSIS:   Inadequate oral intake related to inability to eat as evidenced by NPO status.  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 She was seen by radiation oncology on 03/22/15 and dexamethasone was discontinued with plans for repeat MRI in one month. On 1/14, she presented to the ED with complaints of right-sided chest pain and non-productive cough. A CTA PE protocol was obtained and negative for PE but did show some right-sided infiltrates. She was sent home with a course of levaquin.  Pt intubated, sedated at bedside. MV: 10.3 L/min Temp (24hrs), Avg:100.3 F (37.9 C), Min:99 F (37.2 C), Max:100.9 F (38.3 C)  Propofol: 30 ml/hr  No family at bedside during time of visit. Per MD note; pt presents with ARDs, ARF secondary to seizure. Patient has hx of brain mets s/p radiation; possible decreased seizure threshold related to PNA and levaquin. Pt also stopped her dexamethasone on 1/12 shortly before admission.   Drips: None Sedation: Fentanyl, Propofol, Versed Received Zemuron and Anectine yesterday, has not been ordered today.  Labs: CBGs 102-142 Medications reviewed  Diet Order:  Diet NPO time specified  Skin:  Reviewed, no issues  Last BM:  PTA  Height:   Ht Readings from Last 1  Encounters:  03/26/15 '5\' 8"'$  (1.727 m)    Weight:   Wt Readings from Last 1 Encounters:  03/26/15 241 lb 2.9 oz (109.4 kg)    Ideal Body Weight:  63.63 kg  BMI:  Body mass index is 36.68 kg/(m^2).  Estimated Nutritional Needs:   Kcal:  1200-1530  Protein:  127grams  Fluid:  >/= 1.2L  EDUCATION NEEDS:   No education needs identified at this time  WillSatira Anisrd, MS, RD LDN After Hours/Weekend Pager [phone]

## 2015-03-26 NOTE — Progress Notes (Signed)
[place] Progress Note Patient Name: Destiny [name] DOB: 1970/04/19 MRN: [id number]   Date of Service  03/26/2015  HPI/Events of Note  Patient arrived in ICU. Vasogenic edema on MRI of brain.   eICU Interventions  1. Decadron '4mg'$  IV q6hr 2. Zosyn & Vanc per pharmacy dosing 3. Repeat LA with AM labs     Intervention Category Major Interventions: Respiratory failure - evaluation and management  Tera Partridge 03/26/2015, 2:45 AM

## 2015-03-26 NOTE — Progress Notes (Signed)
Received pt from MRI as ED transfer.  Report taken at the bedside from [place], [place].   Diprivan infusing, Keppra and Vancomycin infusing prior to MRI.  Dr. Ashok Cordia contacted to clarify Diprivan order.

## 2015-03-26 NOTE — Progress Notes (Signed)
Utilization review completed. Anderson Middlebrooks, RN, BSN. 

## 2015-03-26 NOTE — ED Notes (Signed)
Pt in MRI with BP, O2 and HR monitoring.

## 2015-03-26 NOTE — Progress Notes (Signed)
Chaplain assisted family in going to consultation room upon arrival.  MD consulted with them prior to patient CT.  Chaplain encouraged family during visit to patient.  Family members were very calm, concerned.  Will refer to daytime chaplain once she is settled in unit.  Rev. [place], [place]

## 2015-03-26 NOTE — Progress Notes (Signed)
ANTIBIOTIC CONSULT NOTE - INITIAL  Pharmacy Consult for Vancocin and Zosyn Indication: rule out pneumonia  No Known Allergies  Patient Measurements: Weight: 100.4kg  Vital Signs: Temp: 99.6 F (37.6 C) (01/16 0222) Temp Source: Oral (01/16 0222) BP: 122/110 mmHg (01/16 0215) Pulse Rate: 129 (01/16 0215)  Labs:  Recent Labs  03/24/15 1043 03/25/15 2223 03/25/15 2227  WBC 9.1 14.7*  --   HGB 12.6 13.1 15.0  PLT 281 278  --   CREATININE 0.78 1.68* 1.50*   Estimated Creatinine Clearance: 57 mL/min (by C-G formula based on Cr of 1.5).  Medical History: Past Medical History  Diagnosis Date  . Thyroid nodule   . Depression   . Suicide attempt ([place])   . SVT (supraventricular tachycardia) ([place])     a. Long RP tachycardia;  b. 01/2014 s/p RFCA.  . Metastatic lung cancer (metastasis from lung to other site) ([place]) 06/2014    Stage IV (T3, N2, M1b) non-small cell lung cancer, adenocarcinoma; treated with chemo radiation (radiation completed 09/04/14).  Brain met treated with radiation 06/2014.   . Brain metastases ([place])     left brain met treated with radiation 06/2014. MRI 7/27 with new right brain met and shrunken left met.   . Hypertension     Medications:  Prescriptions prior to admission  Medication Sig Dispense Refill Last Dose  . acetaminophen (TYLENOL) 80 MG chewable tablet Chew 80 mg by mouth every 6 (six) hours as needed for mild pain.   Past Week at Unknown time  . azithromycin (ZITHROMAX Z-PAK) 250 MG tablet Take as directed 6 each 0 03/25/2015 at Unknown time  . buPROPion (WELLBUTRIN SR) 150 MG 12 hr tablet Take 1 tablet (150 mg total) by mouth 2 (two) times daily. 60 tablet 2 03/25/2015 at Unknown time  . clonazePAM (KLONOPIN) 0.5 MG tablet Take 1 tablet (0.5 mg total) by mouth 2 (two) times daily as needed for anxiety. (Patient taking differently: Take 0.5 mg by mouth daily. ) 60 tablet 0 03/25/2015 at Unknown time  . dexamethasone (DECADRON) 2 MG tablet   0 Past Week  at Unknown time  . folic acid (FOLVITE) 1 MG tablet Take 1 tablet (1 mg total) by mouth daily. Start 5-7 days before Alimta chemotherapy. Continue until 21 days after Alimta completed. 100 tablet 3 03/25/2015 at Unknown time  . guaiFENesin-codeine 100-10 MG/5ML syrup Take 10 mLs by mouth 3 (three) times daily as needed for cough. 120 mL 0 Past Week at Unknown time  . HYDROcodone-acetaminophen (NORCO/VICODIN) 5-325 MG tablet Take 1-2 tablets by mouth every 4 (four) hours as needed. 6 tablet 0 03/25/2015 at Unknown time  . ibuprofen (ADVIL,MOTRIN) 200 MG tablet Take 400 mg by mouth every 6 (six) hours as needed for moderate pain. Reported on 03/22/2015   Past Month at Unknown time  . levofloxacin (LEVAQUIN) 750 MG tablet Take 1 tablet (750 mg total) by mouth daily. 7 tablet 0 03/25/2015 at Unknown time  . mirtazapine (REMERON) 30 MG tablet Take 30 mg by mouth at bedtime as needed (depression, sleep). Reported on 03/22/2015  0 Past Week at Unknown time  . pantoprazole (PROTONIX) 40 MG tablet Take 40 mg by mouth 2 (two) times daily before a meal. Reported on 03/22/2015   Past Week at Unknown time  . dexamethasone (DECADRON) 4 MG tablet Take 1 tablet (4 mg total) by mouth 2 (two) times daily. (Patient not taking: Reported on 03/22/2015) 60 tablet 1 Not Taking at Unknown time  .  dextromethorphan 15 MG/5ML syrup Take 10 mLs (30 mg total) by mouth 4 (four) times daily as needed for cough. (Patient not taking: Reported on 03/22/2015) 240 mL 1 Not Taking at Unknown time  . hydrOXYzine (VISTARIL) 25 MG capsule Take 1 capsule (25 mg total) by mouth 3 (three) times daily as needed for itching or anxiety. (Patient not taking: Reported on 01/02/2015) 60 capsule 0 Not Taking at Unknown time  . lidocaine (XYLOCAINE) 2 % solution Swallow 5 ml 15 minutes before meals (Patient not taking: Reported on 01/02/2015) 450 mL 0 Not Taking at Unknown time  . ondansetron (ZOFRAN) 8 MG tablet Take 1 tablet (8 mg total) by mouth 2 (two) times  daily. Start the day after chemo for 2 days. Then take as needed for nausea and vomiting. (Patient not taking: Reported on 03/22/2015) 30 tablet 1 Not Taking at Unknown time  . sucralfate (CARAFATE) 1 GM/10ML suspension Take 10 mLs (1 g total) by mouth 4 (four) times daily -  with meals and at bedtime. (Patient not taking: Reported on 01/02/2015) 420 mL 1 Not Taking at Unknown time   Scheduled:  . dexamethasone  4 mg Intravenous 4 times per day  . famotidine  20 mg Per Tube BID  . insulin aspart  2-6 Units Subcutaneous 6 times per day  . levETIRAcetam  500 mg Intravenous Q12H  . propofol      . propofol       Infusions:  . propofol      Assessment: 45yo female found unresponsive by family, EMS found pt to be seizing, breakthrough sz cont'd in ED, EMS reports aspiration of blood while seizing, 265m blood suctioned, sz are new onset but known h/o brain mets (2/2 lung ca) s/p radiation tx, lactic acid found to be elevated, CXR concerning for PNA, to begin IV ABX.  Of note pt had been on vanc last yr w/ low trough at 7578mQ8H though SCr was 0.57 at the time, now 1.68.  Goal of Therapy:  Vancomycin trough level 15-20 mcg/ml  Plan:  Rec'd vanc 2g and Zosyn 3.375g IV in ED; will continue with 125060mV Q24H and Zosyn 3.375g IV Q8H and monitor CBC, Cx, levels prn.  VerWynona NeatharmD, BCPS  03/26/2015,2:44 AM

## 2015-03-26 NOTE — Progress Notes (Signed)
ANTIBIOTIC CONSULT NOTE - INITIAL  Pharmacy Consult for vancomycin/zosyn Indication: pneumonia  No Known Allergies  Patient Measurements: Height: 5' 8"  (172.7 cm) Weight: 241 lb 2.9 oz (109.4 kg) IBW/kg (Calculated) : 63.9  Vital Signs: Temp: 100.2 F (37.9 C) (01/16 1300) Temp Source: Core (Comment) (01/16 1200) BP: 112/67 mmHg (01/16 1300) Pulse Rate: 106 (01/16 1300) Intake/Output from previous day: 01/15 0701 - 01/16 0700 In: 91.8 [I.V.:91.8] Out: 700 [Urine:660; Emesis/NG output:40] Intake/Output from this shift: Total I/O In: 717.7 [I.V.:582.7; NG/GT:30; IV Piggyback:105] Out: 1250 [Urine:1250]  Labs:  Recent Labs  03/24/15 1043 03/25/15 2223 03/25/15 2227 03/26/15 0247  WBC 9.1 14.7*  --  13.4*  HGB 12.6 13.1 15.0 11.2*  PLT 281 278  --  242  CREATININE 0.78 1.68* 1.50* 1.13*   Estimated Creatinine Clearance: 80.6 mL/min (by C-G formula based on Cr of 1.13). No results for input(s): VANCOTROUGH, VANCOPEAK, VANCORANDOM, GENTTROUGH, GENTPEAK, GENTRANDOM, TOBRATROUGH, TOBRAPEAK, TOBRARND, AMIKACINPEAK, AMIKACINTROU, AMIKACIN in the last 72 hours.   Microbiology: Recent Results (from the past 720 hour(s))  Blood culture (routine x 2)     Status: None (Preliminary result)   Collection Time: 03/25/15 10:26 PM  Result Value Ref Range Status   Specimen Description BLOOD LEFT HAND  Final   Special Requests BOTTLES DRAWN AEROBIC ONLY 6CC  Final   Culture NO GROWTH < 12 HOURS  Final   Report Status PENDING  Incomplete    Medical History: Past Medical History  Diagnosis Date  . Thyroid nodule   . Depression   . Suicide attempt ([place])   . SVT (supraventricular tachycardia) ([place])     a. Long RP tachycardia;  b. 01/2014 s/p RFCA.  . Metastatic lung cancer (metastasis from lung to other site) ([place]) 06/2014    Stage IV (T3, N2, M1b) non-small cell lung cancer, adenocarcinoma; treated with chemo radiation (radiation completed 09/04/14).  Brain met treated with  radiation 06/2014.   . Brain metastases ([place])     left brain met treated with radiation 06/2014. MRI 7/27 with new right brain met and shrunken left met.   . Hypertension    Assessment: 45yo female admitted 03/25/2015 after being found unresponsive by family. EMS noticed pt to be seizing, breakthrough sz continued in ED. EMS reports aspiration of blood while seizing. Pharmacy consulted to dose vancomycin/zosyn for possible aspiration pneumonia. Patient received vancomycin 2g loading dose in the ED.   Tmax 100.9, WBC 14.7>13.4, SCr initially 1.68 but has improved now 1.13, CrCl ~80 ml/min  Goal of Therapy:  Vancomycin trough level 15-20 mcg/ml  Plan:  - Vancomycin 750 mg IV q12h - Zosyn 3.375g IV q8h - Monitor C&S, renal function and clinical progression  Dimitri Ped, PharmD. PGY-1 Pharmacy Resident Pager: [phone] 03/26/2015,2:00 PM

## 2015-03-26 NOTE — Progress Notes (Signed)
EEG completed, results pending. 

## 2015-03-26 NOTE — Progress Notes (Addendum)
PULMONARY / CRITICAL CARE MEDICINE   Name: ALAYHA BABINEAUX MRN: [id number] DOB: [birth year]    SUBJECTIVE:  Sedated; on vent Continues to have low-grade fevers and is tachycardic   VITAL SIGNS: Temp:  [99 F (37.2 C)-100.9 F (38.3 C)] 100.6 F (38.1 C) (01/16 0800) Pulse Rate:  [102-159] 102 (01/16 0800) Resp:  [12-30] 30 (01/16 0800) BP: (103-186)/(61-124) 103/66 mmHg (01/16 0800) SpO2:  [86 %-100 %] 100 % (01/16 0800) FiO2 (%):  [50 %-100 %] 50 % (01/16 0940) Weight:  [109.4 kg (241 lb 2.9 oz)] 109.4 kg (241 lb 2.9 oz) (01/16 0205) HEMODYNAMICS:   VENTILATOR SETTINGS: Vent Mode:  [-] PRVC FiO2 (%):  [50 %-100 %] 50 % Set Rate:  [14 bmp-30 bmp] 26 bmp Vt Set:  [380 mL-530 mL] 380 mL PEEP:  [5 cmH20-8 cmH20] 8 cmH20 Plateau Pressure:  [18 cmH20-27 cmH20] 18 cmH20 INTAKE / OUTPUT: Intake/Output      01/15 0701 - 01/16 0700 01/16 0701 - 01/17 0700   I.V. (mL/kg) 91.8 (0.8) 115.8 (1.1)   NG/GT  30   Total Intake(mL/kg) 91.8 (0.8) 145.8 (1.3)   Urine (mL/kg/hr) 660 350 (1.1)   Emesis/NG output 40    Total Output 700 350   Net -608.2 -204.2          PHYSICAL EXAMINATION:  PHYSICAL EXAMINATION: General: on vent  Neuro: Sedated with occasional spontaneous movements  HEENT: Large hematoma on forehead with swelling around both orbits, abrasion on face, bloody oral secretions, ETT in place, OGT in place, cervical collar in place.  Cardiovascular: Tachy, regular, s1/s2, no m/r/g Lungs: Anterior lung fields ronchorous bilaterally with no wheezes or rales. Vent-assisted. Symmetrical expansion.  Abdomen: Obese, +bs, soft, no mass.  Musculoskeletal: No gross deformities. Trace bipedal edema.   Skin: Warm, dry, intact. No rashes.    LABS:  CBC  Recent Labs Lab 03/24/15 1043 03/25/15 2223 03/25/15 2227 03/26/15 0247  WBC 9.1 14.7*  --  13.4*  HGB 12.6 13.1 15.0 11.2*  HCT 37.8 40.1 44.0 33.8*  PLT 281 278  --  242   Coag's  Recent Labs Lab  03/25/15 2223  INR 1.22   BMET  Recent Labs Lab 03/24/15 1043 03/25/15 2223 03/25/15 2227 03/26/15 0247  NA 140 135 135 140  K 4.4 4.9 4.9 3.9  CL 103 100* 103 109  CO2 28 11*  --  22  BUN _0 CREATININE 0.78 1.68* 1.50* 1.13*  GLUCOSE 93 231* 225* 98   Electrolytes  Recent Labs Lab 03/24/15 1043 03/25/15 2223 03/26/15 0247  CALCIUM 9.2 9.0 8.1*  MG  --  2.5* 2.3  PHOS  --   --  3.5   Sepsis Markers  Recent Labs Lab 03/25/15 2228 03/25/15 2344 03/26/15 0255  LATICACIDVEN >17.00* 7.09* 2.5*   ABG  Recent Labs Lab 03/25/15 2244 03/25/15 2340 03/26/15 0912  PHART 6.904* 7.223* 7.538*  PCO2ART 63.7* 46.8* 22.9*  PO2ART 106.0* 76.0* 328*   Liver Enzymes  Recent Labs Lab 03/24/15 1043 03/25/15 2223 03/26/15 0247  AST 22 59* 72*  ALT 31 46 55*  ALKPHOS 73 101 72  BILITOT 0.5 0.4 0.4  ALBUMIN 3.5 3.4* 3.0*   Cardiac Enzymes No results for input(s): TROPONINI, PROBNP in the last 168 hours. Glucose  Recent Labs Lab 03/25/15 2232 03/26/15 0218 03/26/15 0454 03/26/15 0735  GLUCAP 225* 102* 112* 128*    Imaging Ct Head Wo Contrast  03/26/2015  CLINICAL DATA:  45 year old female  with trauma EXAM: CT HEAD WITHOUT CONTRAST CT CERVICAL SPINE WITHOUT CONTRAST TECHNIQUE: Multidetector CT imaging of the head and cervical spine was performed following the standard protocol without intravenous contrast. Multiplanar CT image reconstructions of the cervical spine were also generated. COMPARISON:  Head CT dated 06/10/2014 and MRI dated 06/20/2014 FINDINGS: CT HEAD FINDINGS The ventricles and the sulci are appropriate in size for the patient's age. There is no intracranial hemorrhage. No midline shift or mass effect identified. The gray-white matter differentiation is preserved. There is mild mucoperiosteal thickening of paranasal sinuses. Multiple retention cysts or polyps noted in the right maxillary sinus measuring up to 1.5 cm. The mastoid air cells  are well aerated. The calvarium is intact. Right forehead hematoma. CT CERVICAL SPINE FINDINGS There is no acute fracture or subluxation of the cervical spine.The intervertebral disc spaces are preserved.The odontoid and spinous processes are intact.There is normal anatomic alignment of the C1-C2 lateral masses. The visualized soft tissues appear unremarkable. An endotracheal and enteric tubes are partially visualized. There is complete opacification of the visualized portion of the right lung. IMPRESSION: No acute intracranial pathology. No acute/traumatic cervical spine pathology. Complete opacification of the visualized portion of the right lung. A Electronically Signed   By: Anner Crete M.D.   On: 03/26/2015 00:32   Ct Angio Chest Pe W/cm &/or Wo Cm  03/24/2015  CLINICAL DATA:  Chest pain, back pain, elevated D-dimer. History of lung carcinoma. EXAM: CT ANGIOGRAPHY CHEST WITH CONTRAST TECHNIQUE: Multidetector CT imaging of the chest was performed using the standard protocol during bolus administration of intravenous contrast. Multiplanar CT image reconstructions and MIPs were obtained to evaluate the vascular anatomy. CONTRAST:  73m OMNIPAQUE IOHEXOL 350 MG/ML SOLN COMPARISON:  01/01/2015 FINDINGS: Left arm IV contrast injection. The SVC is patent. Mild right atrial enlargement. Right ventricle is nondilated. Satisfactory opacification of pulmonary arteries noted, and there is no evidence of pulmonary emboli. Patent bilateral pulmonary veins. Scattered coronary calcifications. Adequate contrast opacification of the thoracic aorta with no evidence of dissection, aneurysm, or stenosis. There is classic 3-vessel brachiocephalic arch anatomy without proximal stenosis. A small right pleural effusion has developed since prior study. No pericardial effusion. 14 x 13 mm retrosternal nodule measures slightly smaller than previous (17 x 13). Some interval increase in soft tissue attenuation consolidation in the  right suprahilar region encasing proximal right upper lobe bronchi without narrowing or occlusion. Peripheral pleural-based consolidation in anterior and posterior segments of the right upper lobe and in superior segment right lower lobe, progressive since previous study. Minimal dependent atelectasis posteriorly in the lower lobes right greater than left. Small subpleural blebs in both lung apices. Thoracic spine and sternum intact. Visualized portions of upper abdomen unremarkable. Review of the MIP images confirms the above findings. IMPRESSION: 1. Negative for acute PE or thoracic aortic dissection. 2. Worsening consolidative opacities in the right upper lobe and right suprahilar region. I do not see a a record of interval radiation therapy to this region, suggesting progression of disease, less likely superimposed multifocal pneumonia. Follow-up recommended. 3. New small right pleural effusion. Electronically Signed   By: DLucrezia EuropeM.D.   On: 03/24/2015 14:05   Ct Cervical Spine Wo Contrast  03/26/2015  CLINICAL DATA:  45year old female with trauma EXAM: CT HEAD WITHOUT CONTRAST CT CERVICAL SPINE WITHOUT CONTRAST TECHNIQUE: Multidetector CT imaging of the head and cervical spine was performed following the standard protocol without intravenous contrast. Multiplanar CT image reconstructions of the cervical spine were also generated.  COMPARISON:  Head CT dated 06/10/2014 and MRI dated 06/20/2014 FINDINGS: CT HEAD FINDINGS The ventricles and the sulci are appropriate in size for the patient's age. There is no intracranial hemorrhage. No midline shift or mass effect identified. The gray-white matter differentiation is preserved. There is mild mucoperiosteal thickening of paranasal sinuses. Multiple retention cysts or polyps noted in the right maxillary sinus measuring up to 1.5 cm. The mastoid air cells are well aerated. The calvarium is intact. Right forehead hematoma. CT CERVICAL SPINE FINDINGS There is no  acute fracture or subluxation of the cervical spine.The intervertebral disc spaces are preserved.The odontoid and spinous processes are intact.There is normal anatomic alignment of the C1-C2 lateral masses. The visualized soft tissues appear unremarkable. An endotracheal and enteric tubes are partially visualized. There is complete opacification of the visualized portion of the right lung. IMPRESSION: No acute intracranial pathology. No acute/traumatic cervical spine pathology. Complete opacification of the visualized portion of the right lung. A Electronically Signed   By: Anner Crete M.D.   On: 03/26/2015 00:32   Mr Brain Wo Contrast  03/26/2015  CLINICAL DATA:  Lung cancer with known brain metastasis treated with radiation. New onset seizures today. History of hypertension. EXAM: MRI HEAD WITHOUT CONTRAST TECHNIQUE: Multiplanar, multiecho pulse sequences of the brain and surrounding structures were obtained without intravenous contrast. No gadolinium administered due to low GFR, which is new from prior sampling COMPARISON:  CT head March 25, 2015 and MRI of the brain January 12, 2015 FINDINGS: The ventricles and sulci are normal for patient's age. No abnormal parenchymal signal, mass effect. No reduced diffusion to suggest acute ischemia, postictal state or, hypercellular mass. Rounded FLAIR T2 hyperintense signal LEFT mesial cerebellum and LEFT posterior temporal lobe corresponding to known intracranial metastasis. No susceptibility artifact to suggest hemorrhage. No abnormal extra-axial fluid collections. No extra-axial masses though, contrast enhanced sequences would be more sensitive. Normal major intracranial vascular flow voids seen at the skull base. Ocular globes and orbital contents are nonsuspicious though not tailored for evaluation. Old LEFT medial orbital blowout fracture. No abnormal sellar expansion. No suspicious calvarial bone marrow signal. Craniocervical junction maintained. Mild  paranasal sinus mucosal thickening with small RIGHT maxillary air-fluid level. The mastoid air cells are well aerated. Large RIGHT frontal and LEFT parietal scalp hematomas. IMPRESSION: No acute intracranial process. Please note, contrast not administered due to low GFR. For assessment of metastasis, MRI of the brain with contrast could be performed when patient's renal function improves. Mild vasogenic edema LEFT posterior temporal lobe, LEFT cerebellum corresponding to known metastasis. Larger RIGHT frontal, LEFT parietal scalp hematomas. Moderate paranasal sinusitis. Electronically Signed   By: Elon Alas M.D.   On: 03/26/2015 02:37   Dg Chest Port 1 View  03/25/2015  CLINICAL DATA:  Endotracheal tube placement.  Initial encounter. EXAM: PORTABLE CHEST 1 VIEW COMPARISON:  Chest radiograph performed 03/11/2015, and CTA of the chest performed 03/24/2015 FINDINGS: The patient's endotracheal tube is seen ending 2-3 cm above the carina. There is distention of the proximal trachea, thought to reflect mild overinflation of the endotracheal tube balloon. An enteric tube is noted extending below the diaphragm. Dense right upper lobe airspace opacification is noted, and bilateral perihilar airspace opacity, significantly worsened from the recent prior study and concerning for multifocal pneumonia. A small right pleural effusion is noted. No definite pneumothorax is seen. The cardiomediastinal silhouette is borderline normal in size. No acute osseous abnormalities are identified. IMPRESSION: 1. Endotracheal tube seen ending 2-3 cm above the  carina. Distention of the proximal trachea is thought to reflect mild overinflation of the endotracheal tube balloon. This should be slightly decompressed. 2. Significantly worsened dense right upper lobe airspace opacification and bilateral perihilar opacity, concerning for worsening multifocal pneumonia. Small right pleural effusion noted. These results were called by  telephone at the time of interpretation on 03/25/2015 at 11:34 pm to Dr. Carmin Muskrat, who verbally acknowledged these results. Electronically Signed   By: Garald Balding M.D.   On: 03/25/2015 23:35   US Abdomen Limited Ruq  03/24/2015  CLINICAL DATA:  Right upper quadrant abdominal pain for 1 week. EXAM: US ABDOMEN LIMITED - RIGHT UPPER QUADRANT COMPARISON:  None. FINDINGS: Gallbladder: No gallstones or wall thickening visualized. No sonographic Murphy sign noted by sonographer. Common bile duct: Diameter: 4 mm, within normal limits. Liver: No focal lesion identified. Within normal limits in parenchymal echogenicity. IMPRESSION: Negative. No gallstones or other hepatobiliary abnormality visualized. Electronically Signed   By: Earle Gell M.D.   On: 03/24/2015 11:39   STUDIES:  As above  CULTURES: Blood cx 03/25/15 >> Urine cx 03/25/15 >>  ANTIBIOTICS: Vanc 03/25/15 >> Zosyn 03/25/15 >>  SIGNIFICANT EVENTS: n/a  LINES/TUBES: ETT 03/25/15 Foley 03/25/15  DISCUSSION: Mrs. Ornstein is a 42 F w/ stage iv adenoca of lung origin with mets to brain s/p stereotactic radiosurgery to new cerebellar lesion in 01/2015 who presents with new onset seizure activity. She was recently started on levaquin for a presumed pneumonia, which may have lowered her seizure threshold. She was unable to protect her airway on arrival to the ED and was intubated. She has been covered broadly with antibiotics for possible pneumonia and will be loaded with Keppra per neuro-hospitalist.  ASSESSMENT / PLAN:  PULMONARY A: Acute hypercapneic respiratory failure 2/2 seizure with inability to protect airway Presumed aspiration event Recent pneumonia dx Smoking R lung NSCLC, likely inactive in chest at this time.   P:  Consider transition to ARDS vent strategy 1/16 based on infiltrates and presumed ALI, depending on her O2 needs this am Wean as tolerated DuoNebs > change to scheduled Continue antibiotics    CARDIOVASCULAR A:  No acute issues  P:  monitor  RENAL A:  Acute kidney injury - etiology unclear  P:  Trend Cr  Avoid nephrotoxins Pharmacy to assist with dosing Hold off on renal US  GASTROINTESTINAL A:  No acute issues  P:  Stress ulcer ppx  HEMATOLOGIC A:  No acute issues  P:  Mechanical ppx while still having bloody secretions > add enoxaparin 1/16  INFECTIOUS A:  Concern for post-obstructive pneumonia Probable aspiration  P:  Continue vanc/zosyn for now cx's drawn and pending  ENDOCRINE A:  Hyperglycemia - likely related to seizures  P:  Check A1c,  Accuchecks / SSI  NEUROLOGIC A:  New-onset seizures with altered mental status, ? Decrease seizure threshold in setting recent infection. Also note that her dexamethasone was stopped on 03/22/15 Hx brain metastases (temporal lobe, cerebellum) s/p radiation therapy Concern for cervical injury during fall  P:  Continue sedation with Propofol gtt, Fentanyl prn Keppra load Dexamethasone 88m q6h Neuro following; appreciate recs EEG per neurology (spot vs LTM) Maintain C-collar Will notify Dr MTammi Klippelthat she is admitted   VShela Leff MD PGY 1 ([facility]) Pager #[phone]  TODAY'S SUMMARY:  Attending Note:  I have examined patient, reviewed labs, studies and notes. I have discussed the case with Dr RMarlowe Sax and I agree with the data and plans as amended above.  Pt with NSCLCA met to brain with new cerebellar lesion s/p XRT in 12/16. Admitted with VDRF from seizures. Just stopped steroids and also recent dx RUL PNA, both of which could have decreased seizure threshold. CXR shows B UL PNA's / infiltrates. On my eval she is intermittently agitated, mostly comfortable. Her P/F ratio is > 400 on am abg. Will attempt to decrease her Fio2, continue abx, steroids, keppra. Will notify rad Onc that she was admitted, consult further with Neuro and obtain EEG.  Independent  critical care time is 40 minutes.   Baltazar Apo, MD, PhD 03/26/2015, 9:55 AM [facility] [phone] or if no answer [phone]

## 2015-03-26 NOTE — Progress Notes (Signed)
Patient transported on ventilator to MRI and then to [id number]-03.  No complications.

## 2015-03-26 NOTE — Procedures (Signed)
History: 45 year old female with brain metastasis and seizures  Sedation: Propofol  Technique: This is a 21 channel routine scalp EEG performed at the bedside with bipolar and monopolar montages arranged in accordance to the international 10/20 system of electrode placement. One channel was dedicated to EKG recording.    Background: The majority of this EEG record relatively normal appearance bilaterally symmetric sleep structures. There is a brief period of wakefulness during which posterior dominant rhythm of 9 Hz.  Photic stimulation: Physiologic driving is not performed  EEG Abnormalities: None, though cannot exclude encephalopathy given that no good period of wakefulness was recorded.  Clinical Interpretation: This normal EEG is recorded in the drowsy and sleep state. There was no seizure or seizure predisposition recorded on this study. Please note that a normal EEG does not preclude the possibility of epilepsy.   Encephalopathy not excluded given lack of recording waking state.  Roland Rack, MD [facility] [phone]  If 7pm- 7am, please page neurology on call as listed in [place].

## 2015-03-26 NOTE — Consult Note (Signed)
Very limited access available.  Scar tissue present.  Would recommend central access to ensure pt.'s comfort. Thank you.

## 2015-03-26 NOTE — Consult Note (Signed)
Consult Reason for Martinez Referring Physician: Dr Vanita Panda ED  CC: seizure  HPI: Destiny [name] is an 45 y.o. female with hx of lung cancer with known brain mets (receiving RT) presenting with new onset seizure activity. Per family, she was in her normal state of health this afternoon, around an hour later they found her down unresponsive. Not responding to voice, no abnormal movements. EMS called, they report possible GTC with left gaze deviation during transport, received 7.62m of versed and 252mof narcan, unclear if resolution during transport. Per ED physician, patient did not appear to be seizing upon arrival to ED, was unresponsive but eyes midline, no abnormal movements, withdrawing to noxious stimuli. Intubated for airway protection. Of note patient was evaluated in ED yesterday, diagnosed with possible PNA and started on levaquin. Family notes there may have been some seizure episodes in the past, she is not on any AED.   CT head imaging reviewed, shows no acute process. Labs pertinent for WBC of 14.7, lactic acid > 17 with repeat 7.09. Initial ABG shows pH 6.9, pCO2 63.   Past Medical History  Diagnosis Date  . Thyroid nodule   . Depression   . Suicide attempt ([facility]  . SVT (supraventricular tachycardia) ([facility]    a. Long RP tachycardia;  b. 01/2014 s/p RFCA.  . Metastatic lung cancer (metastasis from lung to other site) ([facility]/2016    Stage IV (T3, N2, M1b) non-small cell lung cancer, adenocarcinoma; treated with chemo radiation (radiation completed 09/04/14).  Brain met treated with radiation 06/2014.   . Brain metastases ([facility]    left brain met treated with radiation 06/2014. MRI 7/27 with new right brain met and shrunken left met.   . Hypertension     Past Surgical History  Procedure Laterality Date  . Thyroid surgery      Removed thyroid nodule, states still has thyroid; 2010  . Supraventricular tachycardia ablation N/A 01/16/2014    Procedure: SUPRAVENTRICULAR  TACHYCARDIA ABLATION;  Surgeon: GrEvans LanceMD;  [facility];  Service: Cardiovascular;  Laterality: N/A;  . Tee without cardioversion N/A 06/12/2014    Procedure: TRANSESOPHAGEAL ECHOCARDIOGRAM (TEE);  Surgeon: TrSueanne MargaritaMD;  [facility];  Service: Cardiovascular;  Laterality: N/A;    Family History  Problem Relation Age of Onset  . Seizures Mother   . Colon cancer Father   . Hypertension Father   . Diabetes Maternal Grandmother   . Diabetes Sister   . Kidney disease Neg Hx   . Liver disease Neg Hx   . Stomach cancer Neg Hx   . Esophageal cancer Neg Hx   . Thyroid cancer Sister     Social History:  reports that she has been smoking Cigarettes.  She has a 30 pack-year smoking history. She uses smokeless tobacco. She reports that she does not drink alcohol or use illicit drugs.  No Known Allergies  Medications: I have reviewed the patient's current medications.   ROS: Out of a complete 14 system review, the patient complains of only the following symptoms, and all other reviewed systems are negative. +seizures  Physical Examination: Filed Vitals:   03/25/15 2345 03/26/15 0000  BP: 119/74 120/71  Pulse: 139 131  Resp: 30 30   Physical Exam  Constitutional: He appears well-developed and well-nourished.  Psych: Affect appropriate to situation Eyes: No scleral injection HENT: No OP obstrucion Head: Normocephalic.  Cardiovascular: Normal rate and regular rhythm.  Respiratory: Effort normal and  breath sounds normal.  GI: Soft. Bowel sounds are normal. No distension. There is no tenderness.  Skin: WDI  Neurologic Examination Mental Status: (intubated on propofol) Eyes closed, though will briefly open to verbal and noxious stimuli, non-verbal, not following commands Cranial Nerves: II: optic discs not visualized,  pupils equal, round, reactive to light  III,IV, VI: ptosis not present, eyes midline, no gaze deviation V,VII: face symmetric,  corneal reflex intact VIII: unable to test IX,X: gag reflex present XI: unable to test XII: unable to test Motor: No spontaneous movement noted. Will weakly withdrawal to noxious stimuli, more briskly on right side compared to left Sensory: see above Deep Tendon Reflexes: 2+ and symmetric throughout Plantars: Right: upgoing   Left: downgoing Cerebellar: Unable to test Gait: deferred  Laboratory Studies:   Basic Metabolic Panel:  Recent Labs Lab 03/24/15 1043 03/25/15 2223 03/25/15 2227  NA 140 135 135  K 4.4 4.9 4.9  CL 103 100* 103  CO2 28 11*  --   GLUCOSE 93 231* 225*  BUN 13 18 19   CREATININE 0.78 1.68* 1.50*  CALCIUM 9.2 9.0  --   MG  --  2.5*  --     Liver Function Tests:  Recent Labs Lab 03/24/15 1043 03/25/15 2223  AST 22 59*  ALT 31 46  ALKPHOS 73 101  BILITOT 0.5 0.4  PROT 6.7 7.0  ALBUMIN 3.5 3.4*   No results for input(s): LIPASE, AMYLASE in the last 168 hours. No results for input(s): AMMONIA in the last 168 hours.  CBC:  Recent Labs Lab 03/24/15 1043 03/25/15 2223 03/25/15 2227  WBC 9.1 14.7*  --   NEUTROABS 6.7 9.4*  --   HGB 12.6 13.1 15.0  HCT 37.8 40.1 44.0  MCV 88.3 93.3  --   PLT 281 278  --     Cardiac Enzymes: No results for input(s): CKTOTAL, CKMB, CKMBINDEX, TROPONINI in the last 168 hours.  BNP: Invalid input(s): POCBNP  CBG: No results for input(s): GLUCAP in the last 168 hours.  Microbiology: Results for orders placed or performed during the hospital encounter of 09/08/14  MRSA PCR Screening     Status: None   Collection Time: 09/08/14 10:40 AM  Result Value Ref Range Status   MRSA by PCR NEGATIVE NEGATIVE Final    Comment:        The GeneXpert MRSA Assay (FDA approved for NASAL specimens only), is one component of a comprehensive MRSA colonization surveillance program. It is not intended to diagnose MRSA infection nor to guide or monitor treatment for MRSA infections.   Culture, blood (routine x 2)      Status: None   Collection Time: 09/08/14  5:30 PM  Result Value Ref Range Status   Specimen Description BLOOD RIGHT ARM  Final   Special Requests BOTTLES DRAWN AEROBIC AND ANAEROBIC 5CC  Final   Culture NO GROWTH 5 DAYS  Final   Report Status 09/13/2014 FINAL  Final  Culture, blood (routine x 2)     Status: None   Collection Time: 09/08/14  5:38 PM  Result Value Ref Range Status   Specimen Description BLOOD LEFT ARM  Final   Special Requests BOTTLES DRAWN AEROBIC AND ANAEROBIC 5CC  Final   Culture NO GROWTH 5 DAYS  Final   Report Status 09/13/2014 FINAL  Final  Urine culture     Status: None   Collection Time: 09/08/14  7:25 PM  Result Value Ref Range Status   Specimen Description URINE,  RANDOM  Final   Special Requests NONE  Final   Culture   Final    MULTIPLE SPECIES PRESENT, SUGGEST RECOLLECTION IF CLINICALLY INDICATED   Report Status 09/10/2014 FINAL  Final    Coagulation Studies:  Recent Labs  03/25/15 2223  LABPROT 15.5*  INR 1.22    Urinalysis: No results for input(s): COLORURINE, LABSPEC, PHURINE, GLUCOSEU, HGBUR, BILIRUBINUR, KETONESUR, PROTEINUR, UROBILINOGEN, NITRITE, LEUKOCYTESUR in the last 168 hours.  Invalid input(s): APPERANCEUR  Lipid Panel:  No results found for: CHOL, TRIG, HDL, CHOLHDL, VLDL, LDLCALC  HgbA1C: No results found for: HGBA1C  Urine Drug Screen:     Component Value Date/Time   LABOPIA NONE DETECTED 06/11/2014 0211   COCAINSCRNUR NONE DETECTED 06/11/2014 0211   LABBENZ NONE DETECTED 06/11/2014 0211   AMPHETMU NONE DETECTED 06/11/2014 0211   THCU NONE DETECTED 06/11/2014 0211   LABBARB NONE DETECTED 06/11/2014 0211    Alcohol Level: No results for input(s): ETH in the last 168 hours.  Other results:  Imaging: Ct Angio Chest Pe W/cm &/or Wo Cm  03/24/2015  CLINICAL DATA:  Chest pain, back pain, elevated D-dimer. History of lung carcinoma. EXAM: CT ANGIOGRAPHY CHEST WITH CONTRAST TECHNIQUE: Multidetector CT imaging of the chest  was performed using the standard protocol during bolus administration of intravenous contrast. Multiplanar CT image reconstructions and MIPs were obtained to evaluate the vascular anatomy. CONTRAST:  58m OMNIPAQUE IOHEXOL 350 MG/ML SOLN COMPARISON:  01/01/2015 FINDINGS: Left arm IV contrast injection. The SVC is patent. Mild right atrial enlargement. Right ventricle is nondilated. Satisfactory opacification of pulmonary arteries noted, and there is no evidence of pulmonary emboli. Patent bilateral pulmonary veins. Scattered coronary calcifications. Adequate contrast opacification of the thoracic aorta with no evidence of dissection, aneurysm, or stenosis. There is classic 3-vessel brachiocephalic arch anatomy without proximal stenosis. A small right pleural effusion has developed since prior study. No pericardial effusion. 14 x 13 mm retrosternal nodule measures slightly smaller than previous (17 x 13). Some interval increase in soft tissue attenuation consolidation in the right suprahilar region encasing proximal right upper lobe bronchi without narrowing or occlusion. Peripheral pleural-based consolidation in anterior and posterior segments of the right upper lobe and in superior segment right lower lobe, progressive since previous study. Minimal dependent atelectasis posteriorly in the lower lobes right greater than left. Small subpleural blebs in both lung apices. Thoracic spine and sternum intact. Visualized portions of upper abdomen unremarkable. Review of the MIP images confirms the above findings. IMPRESSION: 1. Negative for acute PE or thoracic aortic dissection. 2. Worsening consolidative opacities in the right upper lobe and right suprahilar region. I do not see a a record of interval radiation therapy to this region, suggesting progression of disease, less likely superimposed multifocal pneumonia. Follow-up recommended. 3. New small right pleural effusion. Electronically Signed   By: DLucrezia EuropeM.D.    On: 03/24/2015 14:05   Dg Chest Port 1 View  03/25/2015  CLINICAL DATA:  Endotracheal tube placement.  Initial encounter. EXAM: PORTABLE CHEST 1 VIEW COMPARISON:  Chest radiograph performed 03/11/2015, and CTA of the chest performed 03/24/2015 FINDINGS: The patient's endotracheal tube is seen ending 2-3 cm above the carina. There is distention of the proximal trachea, thought to reflect mild overinflation of the endotracheal tube balloon. An enteric tube is noted extending below the diaphragm. Dense right upper lobe airspace opacification is noted, and bilateral perihilar airspace opacity, significantly worsened from the recent prior study and concerning for multifocal pneumonia. A small right pleural effusion  is noted. No definite pneumothorax is seen. The cardiomediastinal silhouette is borderline normal in size. No acute osseous abnormalities are identified. IMPRESSION: 1. Endotracheal tube seen ending 2-3 cm above the carina. Distention of the proximal trachea is thought to reflect mild overinflation of the endotracheal tube balloon. This should be slightly decompressed. 2. Significantly worsened dense right upper lobe airspace opacification and bilateral perihilar opacity, concerning for worsening multifocal pneumonia. Small right pleural effusion noted. These results were called by telephone at the time of interpretation on 03/25/2015 at 11:34 pm to Dr. Carmin Muskrat, who verbally acknowledged these results. Electronically Signed   By: Garald Balding M.D.   On: 03/25/2015 23:35   US Abdomen Limited Ruq  03/24/2015  CLINICAL DATA:  Right upper quadrant abdominal pain for 1 week. EXAM: US ABDOMEN LIMITED - RIGHT UPPER QUADRANT COMPARISON:  None. FINDINGS: Gallbladder: No gallstones or wall thickening visualized. No sonographic Murphy sign noted by sonographer. Common bile duct: Diameter: 4 mm, within normal limits. Liver: No focal lesion identified. Within normal limits in parenchymal echogenicity.  IMPRESSION: Negative. No gallstones or other hepatobiliary abnormality visualized. Electronically Signed   By: Earle Gell M.D.   On: 03/24/2015 11:39     Assessment/Plan:  44y/o woman with hx of lung cancer with known brain mets presenting after being found unresponsive. Per EMS noted to have GTC seizure activity with left-deviation. Received versed, no noted seizure activity upon arrival to ED. Currently intubated and on propofol. No clinical seizure activity on exam, eyes midline and she is weakly withdrawing to noxious stimuli making NCSE less likely. Of note she was recently diagnosed with PNA and started on levaquin which both likely lowered her seizure threshold in a patient with known brain mets.  -MRI brain with and without -keppra 1040m x 1 and then [id number] -EEG -discontinue levaquin, antibiotic coverage per primary team -UA, UDS -seizure precautions PeJim LikeDO [facility] [phone] 7pm- 7am, please page neurology on call as listed in AMFlathead1/16/2017, 12:13 AM

## 2015-03-26 NOTE — ED Provider Notes (Signed)
CSN: [id number]     Arrival date & time 03/25/15  2209 History   First MD Initiated Contact with Patient 03/25/15 2232     Chief Complaint  Patient presents with  . Seizures     (Consider location/radiation/quality/duration/timing/severity/associated sxs/prior Treatment) Patient is a 45 y.o. female presenting with seizures.  Seizures Seizure activity on arrival: no   Seizure type:  Tonic and myoclonic Episode characteristics: abnormal movements and eye deviation   Postictal symptoms: somnolence   Return to baseline: no   Severity:  Severe PTA treatment:  Midazolam History of seizures: no       Past Medical History  Diagnosis Date  . Thyroid nodule   . Depression   . Suicide attempt ([place])   . SVT (supraventricular tachycardia) ([place])     a. Long RP tachycardia;  b. 01/2014 s/p RFCA.  . Metastatic lung cancer (metastasis from lung to other site) ([place]) 06/2014    Stage IV (T3, N2, M1b) non-small cell lung cancer, adenocarcinoma; treated with chemo radiation (radiation completed 09/04/14).  Brain met treated with radiation 06/2014.   . Brain metastases ([place])     left brain met treated with radiation 06/2014. MRI 7/27 with new right brain met and shrunken left met.   . Hypertension    Past Surgical History  Procedure Laterality Date  . Thyroid surgery      Removed thyroid nodule, states still has thyroid; 2010  . Supraventricular tachycardia ablation N/A 01/16/2014    Procedure: SUPRAVENTRICULAR TACHYCARDIA ABLATION;  Surgeon: Evans Lance, MD;  Location: [facility] CATH LAB;  Service: Cardiovascular;  Laterality: N/A;  . Tee without cardioversion N/A 06/12/2014    Procedure: TRANSESOPHAGEAL ECHOCARDIOGRAM (TEE);  Surgeon: Sueanne Margarita, MD;  Location: [facility] ENDOSCOPY;  Service: Cardiovascular;  Laterality: N/A;   Family History  Problem Relation Age of Onset  . Seizures Mother   . Colon cancer Father   . Hypertension Father   . Diabetes Maternal Grandmother   . Diabetes Sister   .  Kidney disease Neg Hx   . Liver disease Neg Hx   . Stomach cancer Neg Hx   . Esophageal cancer Neg Hx   . Thyroid cancer Sister    Social History  Substance Use Topics  . Smoking status: Current Every Day Smoker -- 1.00 packs/day for 30 years    Types: Cigarettes    Last Attempt to Quit: 06/13/2014  . Smokeless tobacco: Current User     Comment: form given 12/29/14, vapor  . 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      Allergies  Review of patient's allergies indicates no known allergies.  Home Medications   Prior to Admission medications   Medication Sig Start Date End Date Taking? Authorizing Provider  acetaminophen (TYLENOL) 80 MG chewable tablet Chew 80 mg by mouth every 6 (six) hours as needed for mild pain.   Yes Historical Provider, MD  azithromycin (ZITHROMAX Z-PAK) 250 MG tablet Take as directed 02/19/15  Yes Nicholas Lose, MD  buPROPion (WELLBUTRIN SR) 150 MG 12 hr tablet Take 1 tablet (150 mg total) by mouth 2 (two) times daily. 02/13/15  Yes Nicholas Lose, MD  clonazePAM (KLONOPIN) 0.5 MG tablet Take 1 tablet (0.5 mg total) by mouth 2 (two) times daily  as needed for anxiety. Patient taking differently: Take 0.5 mg by mouth daily.  11/22/14  Yes Carlton Adam, PA-C  dexamethasone (DECADRON) 2 MG tablet  01/31/15  Yes Historical Provider, MD  folic acid (FOLVITE) 1 MG tablet Take 1 tablet (1 mg total) by mouth daily. Start 5-7 days before Alimta chemotherapy. Continue until 21 days after Alimta completed. 02/13/15  Yes Nicholas Lose, MD  guaiFENesin-codeine 100-10 MG/5ML syrup Take 10 mLs by mouth 3 (three) times daily as needed for cough. 03/11/15  Yes Virgel Manifold, MD  HYDROcodone-acetaminophen (NORCO/VICODIN) 5-325 MG tablet Take 1-2 tablets by mouth every 4 (four) hours as needed. 03/24/15  Yes  Benjamin Cartner, PA-C  ibuprofen (ADVIL,MOTRIN) 200 MG tablet Take 400 mg by mouth every 6 (six) hours as needed for moderate pain. Reported on 03/22/2015   Yes Historical Provider, MD  levofloxacin (LEVAQUIN) 750 MG tablet Take 1 tablet (750 mg total) by mouth daily. 03/24/15  Yes Benjamin Cartner, PA-C  mirtazapine (REMERON) 30 MG tablet Take 30 mg by mouth at bedtime as needed (depression, sleep). Reported on 03/22/2015 11/09/14  Yes Historical Provider, MD  pantoprazole (PROTONIX) 40 MG tablet Take 40 mg by mouth 2 (two) times daily before a meal. Reported on 03/22/2015 08/28/14 08/28/15 Yes Historical Provider, MD  dexamethasone (DECADRON) 4 MG tablet Take 1 tablet (4 mg total) by mouth 2 (two) times daily. Patient not taking: Reported on 03/22/2015 02/13/15   Tyler Pita, MD  dextromethorphan 15 MG/5ML syrup Take 10 mLs (30 mg total) by mouth 4 (four) times daily as needed for cough. Patient not taking: Reported on 03/22/2015 02/19/15   Nicholas Lose, MD  hydrOXYzine (VISTARIL) 25 MG capsule Take 1 capsule (25 mg total) by mouth 3 (three) times daily as needed for itching or anxiety. Patient not taking: Reported on 01/02/2015 11/22/14   Carlton Adam, PA-C  lidocaine (XYLOCAINE) 2 % solution Swallow 5 ml 15 minutes before meals Patient not taking: Reported on 01/02/2015 12/29/14   Amy S Esterwood, PA-C  ondansetron (ZOFRAN) 8 MG tablet Take 1 tablet (8 mg total) by mouth 2 (two) times daily. Start the day after chemo for 2 days. Then take as needed for nausea and vomiting. Patient not taking: Reported on 03/22/2015 02/13/15   Nicholas Lose, MD  sucralfate (CARAFATE) 1 GM/10ML suspension Take 10 mLs (1 g total) by mouth 4 (four) times daily -  with meals and at bedtime. Patient not taking: Reported on 01/02/2015 10/23/14   Susanne Borders, NP   BP 122/110 mmHg  Pulse 129  Temp(Src) 99.6 F (37.6 C) (Oral)  Resp 24  SpO2 100%  LMP 03/16/2015 Physical Exam  Constitutional: No distress.  HENT:   Head: Normocephalic.  Hematoma over forehead  Eyes: EOM are normal. Pupils are equal, round, and reactive to light.  Neck:  c-collar in place  Cardiovascular: Normal rate and intact distal pulses.   Pulmonary/Chest: No respiratory distress.  Abdominal: Soft. There is no tenderness.  Musculoskeletal: Normal range of motion.  Neurological:  Unresponsive.  Non-purposeful movement to pain Pupils 64m bilaterally and reactive Patient spontaneously breathing  Skin: No rash noted. She is not diaphoretic.    ED Course  .Intubation Date/Time: 03/26/2015 2:43 AM Performed by: PJarome MatinAuthorized by: PJarome MatinConsent: The procedure was performed in an emergent situation. Patient identity confirmed: arm band Indications: respiratory failure and  airway protection Intubation method: video-assisted Patient status: paralyzed (RSI) Preoxygenation: nonrebreather mask Laryngoscope size: Mac 3 and Mac 4 Tube  size: 7.0 mm Tube type: cuffed Number of attempts: 3 Ventilation between attempts: BVM Cords visualized: yes Post-procedure assessment: chest rise and CO2 detector Breath sounds: equal and absent over the epigastrium Cuff inflated: yes ETT to lip: 23 cm Tube secured with: ETT holder Chest x-ray interpreted by me. Chest x-ray findings: endotracheal tube in appropriate position Patient tolerance: Patient tolerated the procedure well with no immediate complications Comments: Initially attempted intubation with Mac 3 video laryngoscope and difficulty as glottic opening was fairly deep.  Switched to Mac 4 with rigid glidescope stylet and easily passed tube on 3rd attempt.  Brief desat during attempts to 70's which quickly came back up   (including critical care time) Labs Review Labs Reviewed  CBC WITH DIFFERENTIAL/PLATELET - Abnormal; Notable for the following:    WBC 14.7 (*)    RDW 16.5 (*)    Neutro Abs 9.4 (*)    Monocytes Absolute 1.3 (*)    All other components within  normal limits  COMPREHENSIVE METABOLIC PANEL - Abnormal; Notable for the following:    Chloride 100 (*)    CO2 11 (*)    Glucose, Bld 231 (*)    Creatinine, Ser 1.68 (*)    Albumin 3.4 (*)    AST 59 (*)    GFR calc non Af Amer 36 (*)    GFR calc Af Amer 41 (*)    Anion gap 24 (*)    All other components within normal limits  MAGNESIUM - Abnormal; Notable for the following:    Magnesium 2.5 (*)    All other components within normal limits  URINE RAPID DRUG SCREEN, HOSP PERFORMED - Abnormal; Notable for the following:    Opiates POSITIVE (*)    Benzodiazepines POSITIVE (*)    All other components within normal limits  PROTIME-INR - Abnormal; Notable for the following:    Prothrombin Time 15.5 (*)    All other components within normal limits  URINALYSIS, ROUTINE W REFLEX MICROSCOPIC (NOT AT [facility]) - Abnormal; Notable for the following:    APPearance CLOUDY (*)    Hgb urine dipstick LARGE (*)    Protein, ur 100 (*)    All other components within normal limits  URINE MICROSCOPIC-ADD ON - Abnormal; Notable for the following:    Squamous Epithelial / LPF 0-5 (*)    Bacteria, UA RARE (*)    Casts HYALINE CASTS (*)    Crystals URIC ACID CRYSTALS (*)    All other components within normal limits  GLUCOSE, CAPILLARY - Abnormal; Notable for the following:    Glucose-Capillary 102 (*)    All other components within normal limits  I-STAT CHEM 8, ED - Abnormal; Notable for the following:    Creatinine, Ser 1.50 (*)    Glucose, Bld 225 (*)    All other components within normal limits  I-STAT CG4 LACTIC ACID, ED - Abnormal; Notable for the following:    Lactic Acid, Venous >17.00 (*)    All other components within normal limits  I-STAT ARTERIAL BLOOD GAS, ED - Abnormal; Notable for the following:    pH, Arterial 6.904 (*)    pCO2 arterial 63.7 (*)    pO2, Arterial 106.0 (*)    Bicarbonate 12.6 (*)    Acid-base deficit 21.0 (*)    All other components within normal limits  I-STAT CG4  LACTIC ACID, ED - Abnormal; Notable for the following:    Lactic Acid, Venous 7.09 (*)    All other components within normal  limits  I-STAT TROPOININ, ED - Abnormal; Notable for the following:    Troponin i, poc 0.16 (*)    All other components within normal limits  I-STAT ARTERIAL BLOOD GAS, ED - Abnormal; Notable for the following:    pH, Arterial 7.223 (*)    pCO2 arterial 46.8 (*)    pO2, Arterial 76.0 (*)    Bicarbonate 19.2 (*)    Acid-base deficit 8.0 (*)    All other components within normal limits  URINE CULTURE  CULTURE, BLOOD (ROUTINE X 2)  CULTURE, BLOOD (ROUTINE X 2)  T4, FREE  TSH  BLOOD GAS, ARTERIAL  BLOOD GAS, ARTERIAL  CBC  MAGNESIUM  PHOSPHORUS  COMPREHENSIVE METABOLIC PANEL  HEMOGLOBIN A1C  I-STAT TROPOININ, ED    Imaging Review Ct Head Wo Contrast  03/26/2015  CLINICAL DATA:  45 year old female with trauma EXAM: CT HEAD WITHOUT CONTRAST CT CERVICAL SPINE WITHOUT CONTRAST TECHNIQUE: Multidetector CT imaging of the head and cervical spine was performed following the standard protocol without intravenous contrast. Multiplanar CT image reconstructions of the cervical spine were also generated. COMPARISON:  Head CT dated 06/10/2014 and MRI dated 06/20/2014 FINDINGS: CT HEAD FINDINGS The ventricles and the sulci are appropriate in size for the patient's age. There is no intracranial hemorrhage. No midline shift or mass effect identified. The gray-white matter differentiation is preserved. There is mild mucoperiosteal thickening of paranasal sinuses. Multiple retention cysts or polyps noted in the right maxillary sinus measuring up to 1.5 cm. The mastoid air cells are well aerated. The calvarium is intact. Right forehead hematoma. CT CERVICAL SPINE FINDINGS There is no acute fracture or subluxation of the cervical spine.The intervertebral disc spaces are preserved.The odontoid and spinous processes are intact.There is normal anatomic alignment of the C1-C2 lateral masses.  The visualized soft tissues appear unremarkable. An endotracheal and enteric tubes are partially visualized. There is complete opacification of the visualized portion of the right lung. IMPRESSION: No acute intracranial pathology. No acute/traumatic cervical spine pathology. Complete opacification of the visualized portion of the right lung. A Electronically Signed   By: Anner Crete M.D.   On: 03/26/2015 00:32   Ct Angio Chest Pe W/cm &/or Wo Cm  03/24/2015  CLINICAL DATA:  Chest pain, back pain, elevated D-dimer. History of lung carcinoma. EXAM: CT ANGIOGRAPHY CHEST WITH CONTRAST TECHNIQUE: Multidetector CT imaging of the chest was performed using the standard protocol during bolus administration of intravenous contrast. Multiplanar CT image reconstructions and MIPs were obtained to evaluate the vascular anatomy. CONTRAST:  77m OMNIPAQUE IOHEXOL 350 MG/ML SOLN COMPARISON:  01/01/2015 FINDINGS: Left arm IV contrast injection. The SVC is patent. Mild right atrial enlargement. Right ventricle is nondilated. Satisfactory opacification of pulmonary arteries noted, and there is no evidence of pulmonary emboli. Patent bilateral pulmonary veins. Scattered coronary calcifications. Adequate contrast opacification of the thoracic aorta with no evidence of dissection, aneurysm, or stenosis. There is classic 3-vessel brachiocephalic arch anatomy without proximal stenosis. A small right pleural effusion has developed since prior study. No pericardial effusion. 14 x 13 mm retrosternal nodule measures slightly smaller than previous (17 x 13). Some interval increase in soft tissue attenuation consolidation in the right suprahilar region encasing proximal right upper lobe bronchi without narrowing or occlusion. Peripheral pleural-based consolidation in anterior and posterior segments of the right upper lobe and in superior segment right lower lobe, progressive since previous study. Minimal dependent atelectasis posteriorly  in the lower lobes right greater than left. Small subpleural blebs in both lung apices.  Thoracic spine and sternum intact. Visualized portions of upper abdomen unremarkable. Review of the MIP images confirms the above findings. IMPRESSION: 1. Negative for acute PE or thoracic aortic dissection. 2. Worsening consolidative opacities in the right upper lobe and right suprahilar region. I do not see a a record of interval radiation therapy to this region, suggesting progression of disease, less likely superimposed multifocal pneumonia. Follow-up recommended. 3. New small right pleural effusion. Electronically Signed   By: Lucrezia Europe M.D.   On: 03/24/2015 14:05   Ct Cervical Spine Wo Contrast  03/26/2015  CLINICAL DATA:  45 year old female with trauma EXAM: CT HEAD WITHOUT CONTRAST CT CERVICAL SPINE WITHOUT CONTRAST TECHNIQUE: Multidetector CT imaging of the head and cervical spine was performed following the standard protocol without intravenous contrast. Multiplanar CT image reconstructions of the cervical spine were also generated. COMPARISON:  Head CT dated 06/10/2014 and MRI dated 06/20/2014 FINDINGS: CT HEAD FINDINGS The ventricles and the sulci are appropriate in size for the patient's age. There is no intracranial hemorrhage. No midline shift or mass effect identified. The gray-white matter differentiation is preserved. There is mild mucoperiosteal thickening of paranasal sinuses. Multiple retention cysts or polyps noted in the right maxillary sinus measuring up to 1.5 cm. The mastoid air cells are well aerated. The calvarium is intact. Right forehead hematoma. CT CERVICAL SPINE FINDINGS There is no acute fracture or subluxation of the cervical spine.The intervertebral disc spaces are preserved.The odontoid and spinous processes are intact.There is normal anatomic alignment of the C1-C2 lateral masses. The visualized soft tissues appear unremarkable. An endotracheal and enteric tubes are partially visualized.  There is complete opacification of the visualized portion of the right lung. IMPRESSION: No acute intracranial pathology. No acute/traumatic cervical spine pathology. Complete opacification of the visualized portion of the right lung. A Electronically Signed   By: Anner Crete M.D.   On: 03/26/2015 00:32   Mr Brain Wo Contrast  03/26/2015  CLINICAL DATA:  Lung cancer with known brain metastasis treated with radiation. New onset seizures today. History of hypertension. EXAM: MRI HEAD WITHOUT CONTRAST TECHNIQUE: Multiplanar, multiecho pulse sequences of the brain and surrounding structures were obtained without intravenous contrast. No gadolinium administered due to low GFR, which is new from prior sampling COMPARISON:  CT head March 25, 2015 and MRI of the brain January 12, 2015 FINDINGS: The ventricles and sulci are normal for patient's age. No abnormal parenchymal signal, mass effect. No reduced diffusion to suggest acute ischemia, postictal state or, hypercellular mass. Rounded FLAIR T2 hyperintense signal LEFT mesial cerebellum and LEFT posterior temporal lobe corresponding to known intracranial metastasis. No susceptibility artifact to suggest hemorrhage. No abnormal extra-axial fluid collections. No extra-axial masses though, contrast enhanced sequences would be more sensitive. Normal major intracranial vascular flow voids seen at the skull base. Ocular globes and orbital contents are nonsuspicious though not tailored for evaluation. Old LEFT medial orbital blowout fracture. No abnormal sellar expansion. No suspicious calvarial bone marrow signal. Craniocervical junction maintained. Mild paranasal sinus mucosal thickening with small RIGHT maxillary air-fluid level. The mastoid air cells are well aerated. Large RIGHT frontal and LEFT parietal scalp hematomas. IMPRESSION: No acute intracranial process. Please note, contrast not administered due to low GFR. For assessment of metastasis, MRI of the brain  with contrast could be performed when patient's renal function improves. Mild vasogenic edema LEFT posterior temporal lobe, LEFT cerebellum corresponding to known metastasis. Larger RIGHT frontal, LEFT parietal scalp hematomas. Moderate paranasal sinusitis. Electronically Signed   By: Sandie Ano  Bloomer M.D.   On: 03/26/2015 02:37   Dg Chest Port 1 View  03/25/2015  CLINICAL DATA:  Endotracheal tube placement.  Initial encounter. EXAM: PORTABLE CHEST 1 VIEW COMPARISON:  Chest radiograph performed 03/11/2015, and CTA of the chest performed 03/24/2015 FINDINGS: The patient's endotracheal tube is seen ending 2-3 cm above the carina. There is distention of the proximal trachea, thought to reflect mild overinflation of the endotracheal tube balloon. An enteric tube is noted extending below the diaphragm. Dense right upper lobe airspace opacification is noted, and bilateral perihilar airspace opacity, significantly worsened from the recent prior study and concerning for multifocal pneumonia. A small right pleural effusion is noted. No definite pneumothorax is seen. The cardiomediastinal silhouette is borderline normal in size. No acute osseous abnormalities are identified. IMPRESSION: 1. Endotracheal tube seen ending 2-3 cm above the carina. Distention of the proximal trachea is thought to reflect mild overinflation of the endotracheal tube balloon. This should be slightly decompressed. 2. Significantly worsened dense right upper lobe airspace opacification and bilateral perihilar opacity, concerning for worsening multifocal pneumonia. Small right pleural effusion noted. These results were called by telephone at the time of interpretation on 03/25/2015 at 11:34 pm to Dr. Carmin Muskrat, who verbally acknowledged these results. Electronically Signed   By: Garald Balding M.D.   On: 03/25/2015 23:35   US Abdomen Limited Ruq  03/24/2015  CLINICAL DATA:  Right upper quadrant abdominal pain for 1 week. EXAM: US ABDOMEN  LIMITED - RIGHT UPPER QUADRANT COMPARISON:  None. FINDINGS: Gallbladder: No gallstones or wall thickening visualized. No sonographic Murphy sign noted by sonographer. Common bile duct: Diameter: 4 mm, within normal limits. Liver: No focal lesion identified. Within normal limits in parenchymal echogenicity. IMPRESSION: Negative. No gallstones or other hepatobiliary abnormality visualized. Electronically Signed   By: Earle Gell M.D.   On: 03/24/2015 11:39   I have personally reviewed and evaluated these images and lab results as part of my medical decision-making.   EKG Interpretation   Date/Time:  Sunday March 25 2015 23:00:23 EST Ventricular Rate:  160 PR Interval:  110 QRS Duration: 82 QT Interval:  350 QTC Calculation: 571 R Axis:   -131 Text Interpretation:  Sinus tachycardia Ventricular premature complex  Probable left atrial enlargement Markedly posterior QRS axis Prolonged QT  interval Sinus tachycardia ST-t wave abnormality Abnormal ekg Confirmed by  Carmin Muskrat  MD [phone]) on 03/25/2015 11:36:14 PM      MDM   Final diagnoses:  Endotracheally intubated  Episode of unresponsiveness    80 y F w h/o cancer w/ brain and lung lesions who comes in with seizure  Patient was spoken with approx around 8:30 and thought to be normal.  <1 hour later patient was found unresponsive on the ground with contusion on forehead.  EMS called out.  With EMS she had generalized seizure activity and was givne 7.43m versed on the way to the hospital  On arrival she doesn't appear to be seizing but isn't doing anything purposeful and is requiring assistance with ventilation by bag/valve mask.  Decision made to intubate and patient was intubated as documented above.  During the intubations attempts the patient did briefly desat to the 70's but quickly improved.  Patient maintained good blood pressure throughout.  Ct head obtained and no obvious bleed.  Feel this was likely seizure 2/2 known brain  masses.    Neuro consulted.  Neuro recommends keppra.  Labs coming back and lactic acid elevated.  Will fluid bolus.  ABG initially with profound acidosis which improved with ventilator adjustments and fluid boluses.  Patient having difficulty with oxygenation, have increased PEEP from 5 to 8 and patient is 100% FiO2.  I have empirically covered for pna with vanc/zosyn given recent ED visit for URI and patient with infiltrate on CXR.  Patient will be admitted to MICU   Jarome Matin, MD 03/26/15 [place], MD 03/29/15 1001

## 2015-03-27 ENCOUNTER — Encounter (HOSPITAL_COMMUNITY): Payer: Self-pay

## 2015-03-27 ENCOUNTER — Inpatient Hospital Stay (HOSPITAL_COMMUNITY): Payer: Medicaid Other

## 2015-03-27 DIAGNOSIS — C3411 Malignant neoplasm of upper lobe, right bronchus or lung: Secondary | ICD-10-CM

## 2015-03-27 DIAGNOSIS — J9690 Respiratory failure, unspecified, unspecified whether with hypoxia or hypercapnia: Secondary | ICD-10-CM

## 2015-03-27 DIAGNOSIS — J9600 Acute respiratory failure, unspecified whether with hypoxia or hypercapnia: Secondary | ICD-10-CM

## 2015-03-27 DIAGNOSIS — R569 Unspecified convulsions: Secondary | ICD-10-CM

## 2015-03-27 DIAGNOSIS — G9340 Encephalopathy, unspecified: Secondary | ICD-10-CM

## 2015-03-27 DIAGNOSIS — C7931 Secondary malignant neoplasm of brain: Secondary | ICD-10-CM

## 2015-03-27 LAB — BASIC METABOLIC PANEL
ANION GAP: 11 (ref 5–15)
BUN: 13 mg/dL (ref 6–20)
CHLORIDE: 106 mmol/L (ref 101–111)
CO2: 23 mmol/L (ref 22–32)
Calcium: 8.7 mg/dL — ABNORMAL LOW (ref 8.9–10.3)
Creatinine, Ser: 0.91 mg/dL (ref 0.44–1.00)
GFR calc non Af Amer: >60 mL/min (ref >60)
Glucose, Bld: 145 mg/dL — ABNORMAL HIGH (ref 65–99)
Potassium: 4.7 mmol/L (ref 3.5–5.1)
Sodium: 140 mmol/L (ref 135–145)

## 2015-03-27 LAB — POCT I-STAT 3, ART BLOOD GAS (G3+)
Acid-base deficit: 4 mmol/L — ABNORMAL HIGH (ref 0.0–2.0)
BICARBONATE: 23.3 meq/L (ref 20.0–24.0)
O2 Saturation: 97 %
PO2 ART: 100 mmHg (ref 80.0–100.0)
Patient temperature: 97.5
TCO2: 25 mmol/L (ref 0–100)
pCO2 arterial: 47.7 mmHg — ABNORMAL HIGH (ref 35.0–45.0)
pH, Arterial: 7.295 — ABNORMAL LOW (ref 7.350–7.450)

## 2015-03-27 LAB — PHOSPHORUS: PHOSPHORUS: 3.6 mg/dL (ref 2.5–4.6)

## 2015-03-27 LAB — URINE CULTURE: CULTURE: NO GROWTH

## 2015-03-27 LAB — CBC
HCT: 33.5 % — ABNORMAL LOW (ref 36.0–46.0)
HEMOGLOBIN: 10.8 g/dL — AB (ref 12.0–15.0)
MCH: 28.6 pg (ref 26.0–34.0)
MCHC: 32.2 g/dL (ref 30.0–36.0)
MCV: 88.6 fL (ref 78.0–100.0)
Platelets: 244 10*3/uL (ref 150–400)
RBC: 3.78 MIL/uL — AB (ref 3.87–5.11)
RDW: 17 % — ABNORMAL HIGH (ref 11.5–15.5)
WBC: 12.6 10*3/uL — ABNORMAL HIGH (ref 4.0–10.5)

## 2015-03-27 LAB — HEMOGLOBIN A1C
Hgb A1c MFr Bld: 5.7 % — ABNORMAL HIGH (ref 4.8–5.6)
Mean Plasma Glucose: 117 mg/dL

## 2015-03-27 LAB — GLUCOSE, CAPILLARY
GLUCOSE-CAPILLARY: 132 mg/dL — AB (ref 65–99)
GLUCOSE-CAPILLARY: 131 mg/dL — AB (ref 65–99)
Glucose-Capillary: 123 mg/dL — ABNORMAL HIGH (ref 65–99)
Glucose-Capillary: 124 mg/dL — ABNORMAL HIGH (ref 65–99)
Glucose-Capillary: 132 mg/dL — ABNORMAL HIGH (ref 65–99)
Glucose-Capillary: 136 mg/dL — ABNORMAL HIGH (ref 65–99)

## 2015-03-27 LAB — TRIGLYCERIDES: Triglycerides: 116 mg/dL (ref <150)

## 2015-03-27 LAB — PROCALCITONIN: Procalcitonin: 5.22 ng/mL

## 2015-03-27 LAB — MAGNESIUM: MAGNESIUM: 2.7 mg/dL — AB (ref 1.7–2.4)

## 2015-03-27 MED ORDER — SENNOSIDES-DOCUSATE SODIUM 8.6-50 MG PO TABS
1.0000 | ORAL_TABLET | Freq: Two times a day (BID) | ORAL | Status: DC
  Filled 2015-03-27 (×2): qty 1

## 2015-03-27 MED ORDER — VITAL HIGH PROTEIN PO LIQD
1000.0000 mL | ORAL | Status: DC
  Administered 2015-03-27: 1000 mL
  Administered 2015-03-28: 06:00:00
  Administered 2015-03-28: 1000 mL

## 2015-03-27 MED ORDER — PRO-STAT SUGAR FREE PO LIQD
30.0000 mL | Freq: Two times a day (BID) | ORAL | Status: DC
  Administered 2015-03-27 – 2015-03-28 (×3): 30 mL
  Filled 2015-03-27 (×4): qty 30

## 2015-03-27 MED ORDER — ENOXAPARIN SODIUM 60 MG/0.6ML ~~LOC~~ SOLN
60.0000 mg | SUBCUTANEOUS | Status: DC
  Administered 2015-03-27 – 2015-03-29 (×3): 60 mg via SUBCUTANEOUS
  Filled 2015-03-27 (×3): qty 0.6

## 2015-03-27 MED ORDER — ENOXAPARIN SODIUM 60 MG/0.6ML ~~LOC~~ SOLN
50.0000 mg | SUBCUTANEOUS | Status: DC
  Filled 2015-03-27: qty 0.6

## 2015-03-27 MED ORDER — HALOPERIDOL LACTATE 5 MG/ML IJ SOLN: 5.0000 mg | Freq: Four times a day (QID) | INTRAMUSCULAR | Status: DC | PRN

## 2015-03-27 MED ORDER — BISACODYL 10 MG RE SUPP
10.0000 mg | Freq: Every day | RECTAL | Status: DC | PRN
  Administered 2015-03-27: 10 mg via RECTAL
  Filled 2015-03-27: qty 1

## 2015-03-27 NOTE — Progress Notes (Signed)
HEMATOLOGY-ONCOLOGY PROGRESS NOTE  SUBJECTIVE: Patient admitted with suspicion of seizure and acute confusional state. She was unable to maintain her airway and has been intubated. 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 Negative for CK 20, CD X2, estrogen receptor, consistent with lung primary. EGFR negative    06/20/2014 Imaging Brain MRI with and without contrast showed solitary lesion in the left posterior temporal lobe measuring 12 x 14 mm, with peripheral ring enhancement and surrounding vasogenic edema.   06/23/2014 PET scan 2 hypermetabolic pulmonary nodules in the right upper lobe, hypermetabolic right peritracheal and subcarinal nodes, no other distant metastasis.   06/29/2014 - 06/29/2014 Radiation Therapy SRS to brain lesion    07/18/2014 - 08/28/2014 Chemotherapy Chemotherapy Carbo/Taxol weekly 7 concurrently with radiation   01/01/2015 Imaging right upper lobe nodule 7 mm, 2 additional nodules right upper lung, focal peripheral ill-defined opacities a new due to inflammation from radiation, mediastinal lymph nodes stable   01/12/2015 Imaging brain MRI: Slight interval regrowth of left inferior temporal lesion 10 x 9 mm was previously 9 x 8 mm, interval improvement of previous right parietal superficial lesion previously 11 mm now 2 mm   01/17/2015 Procedure stereotactic radiosurgery to the 9 x 8 mm left cerebellar lesion   OBJECTIVE: PHYSICAL EXAMINATION: ECOG PERFORMANCE STATUS: 4 - Bedbound  Filed Vitals:   03/27/15 1100 03/27/15 1157  BP: 103/69   Pulse: 73 74  Temp: 97.9 F (36.6 C)   Resp: 16 18   Filed Weights   03/26/15 0205  Weight: 241 lb 2.9 oz (109.4 kg)    Lungs: Clear Heart: S1 S2 Normal Abd:  soft Ext: Mild swelling of upper and lower extremities Neuro: Not tested  LABORATORY DATA:  I have reviewed the data as listed CMP Latest Ref Rng 03/27/2015 03/26/2015 03/25/2015  Glucose 65 - 99 mg/dL 145(H) 98 225(H)  BUN 6 - 20 mg/dL _0 Creatinine 0.44 - 1.00 mg/dL 0.91 1.13(H) 1.50(H)  Sodium 135 - 145 mmol/L 140 140 135  Potassium 3.5 - 5.1 mmol/L 4.7 3.9 4.9  Chloride 101 - 111 mmol/L 106 109 103  CO2 22 - 32 mmol/L 23 22 -  Calcium 8.9 - 10.3 mg/dL 8.7(L) 8.1(L) -  Total Protein 6.5 - 8.1 g/dL - 5.9(L) -  Total Bilirubin 0.3 - 1.2 mg/dL - 0.4 -  Alkaline Phos 38 - 126 U/L - 72 -  AST 15 - 41 U/L - 72(H) -  ALT 14 - 54 U/L - 55(H) -    Lab Results  Component Value Date   WBC 12.6* 03/27/2015   HGB 10.8* 03/27/2015   HCT 33.5* 03/27/2015   MCV 88.6 03/27/2015   PLT 244 03/27/2015   NEUTROABS 9.4* 03/25/2015    ASSESSMENT AND PLAN: 1. Metastatic Lung cancer with brain mets: S/P SRS to brain mets Last scans done in Oct 2016 revealed stable disease in chest. I offered her Alimta maintenance therapy and she decided against getting any more chemotherapy. Shes admitted with suspicion of seizures and resp failure MRI brain suggests vasogenic edema in brain which could have caused this. She was taken off steroids recently. Now shes back on Decadron IV which should help the edema.  2. Resp Failure:  Currently on Vanc and Zosyn. CXR showed improvement.  Patient knows that she has an incurable cancer. Radiologically, she does not have a whole lot of active disease other than the brain mets based on Cts in October. If she recovers from this hospitalization, we will plan on restaging her.  Please call if there are any questions.

## 2015-03-27 NOTE — Progress Notes (Signed)
PULMONARY / CRITICAL CARE MEDICINE   Name: Destiny [name] MRN: [id number] DOB: [birth year]    SUBJECTIVE:  Sedated; on vent Vitals are stable As per nursing staff, patient was agitated last night despite being on Propofol gtt and Fentanyl gtt  VITAL SIGNS: Temp:  [97.5 F (36.4 C)-100.8 F (38.2 C)] 98.1 F (36.7 C) (01/17 0600) Pulse Rate:  [80-118] 80 (01/17 0600) Resp:  [13-30] 16 (01/17 0600) BP: (91-133)/(58-83) 101/67 mmHg (01/17 0600) SpO2:  [99 %-100 %] 100 % (01/17 0600) FiO2 (%):  [50 %-70 %] 50 % (01/17 0312) HEMODYNAMICS:   VENTILATOR SETTINGS: Vent Mode:  [-] PRVC FiO2 (%):  [50 %-70 %] 50 % Set Rate:  [16 bmp-30 bmp] 16 bmp Vt Set:  [380 mL-530 mL] 380 mL PEEP:  [8 cmH20] 8 cmH20 Plateau Pressure:  [13 cmH20-23 cmH20] 23 cmH20 INTAKE / OUTPUT: Intake/Output      01/16 0701 - 01/17 0700 01/17 0701 - 01/18 0700   I.V. (mL/kg) 1724.7 (15.8)    NG/GT 30    IV Piggyback 710    Total Intake(mL/kg) 2464.7 (22.5)    Urine (mL/kg/hr) 2600 (1)    Emesis/NG output     Total Output 2600     Net -135.3            PHYSICAL EXAMINATION:  PHYSICAL EXAMINATION: General: on vent  Neuro: Sedated HEENT:ETT in place, OGT in place, cervical collar in place.  Cardiovascular:RRR, s1/s2, no m/r/g Lungs: Anterior lung fields clear to auscultation bilaterally. Vent-assisted. Symmetrical expansion.  Abdomen: Obese, hypoactive bowel sounds, mildly distended  Musculoskeletal: No gross deformities. Trace bipedal edema.   Skin: Warm, dry, intact. No rashes.    LABS:  CBC  Recent Labs Lab 03/25/15 2223 03/25/15 2227 03/26/15 0247 03/27/15 0244  WBC 14.7*  --  13.4* 12.6*  HGB 13.1 15.0 11.2* 10.8*  HCT 40.1 44.0 33.8* 33.5*  PLT 278  --  242 244   Coag's  Recent Labs Lab 03/25/15 2223  INR 1.22   BMET  Recent Labs Lab 03/25/15 2223 03/25/15 2227 03/26/15 0247 03/27/15 0244  NA 135 135 140 140  K 4.9 4.9 3.9 4.7  CL 100* 103 109 106  CO2  11*  --  22 23  BUN '18 19 14 13  '$ CREATININE 1.68* 1.50* 1.13* 0.91  GLUCOSE 231* 225* 98 145*   Electrolytes  Recent Labs Lab 03/25/15 2223 03/26/15 0247 03/27/15 0244  CALCIUM 9.0 8.1* 8.7*  MG 2.5* 2.3  --   PHOS  --  3.5  --    Sepsis Markers  Recent Labs Lab 03/25/15 2228 03/25/15 2344 03/26/15 0255 03/27/15 0244  LATICACIDVEN >17.00* 7.09* 2.5*  --   PROCALCITON  --   --   --  5.22   ABG  Recent Labs Lab 03/25/15 2340 03/26/15 0912 03/27/15 0252  PHART 7.223* 7.538* 7.295*  PCO2ART 46.8* 22.9* 47.7*  PO2ART 76.0* 328* 100.0   Liver Enzymes  Recent Labs Lab 03/24/15 1043 03/25/15 2223 03/26/15 0247  AST 22 59* 72*  ALT 31 46 55*  ALKPHOS 73 101 72  BILITOT 0.5 0.4 0.4  ALBUMIN 3.5 3.4* 3.0*   Cardiac Enzymes No results for input(s): TROPONINI, PROBNP in the last 168 hours. Glucose  Recent Labs Lab 03/26/15 0735 03/26/15 1110 03/26/15 1540 03/26/15 1915 03/26/15 2339 03/27/15 0328  GLUCAP 128* 142* 148* 126* 124* 136*    Imaging Ct Head Wo Contrast  03/26/2015  CLINICAL DATA:  45 year old female with trauma  EXAM: CT HEAD WITHOUT CONTRAST CT CERVICAL SPINE WITHOUT CONTRAST TECHNIQUE: Multidetector CT imaging of the head and cervical spine was performed following the standard protocol without intravenous contrast. Multiplanar CT image reconstructions of the cervical spine were also generated. COMPARISON:  Head CT dated 06/10/2014 and MRI dated 06/20/2014 FINDINGS: CT HEAD FINDINGS The ventricles and the sulci are appropriate in size for the patient's age. There is no intracranial hemorrhage. No midline shift or mass effect identified. The gray-white matter differentiation is preserved. There is mild mucoperiosteal thickening of paranasal sinuses. Multiple retention cysts or polyps noted in the right maxillary sinus measuring up to 1.5 cm. The mastoid air cells are well aerated. The calvarium is intact. Right forehead hematoma. CT CERVICAL SPINE  FINDINGS There is no acute fracture or subluxation of the cervical spine.The intervertebral disc spaces are preserved.The odontoid and spinous processes are intact.There is normal anatomic alignment of the C1-C2 lateral masses. The visualized soft tissues appear unremarkable. An endotracheal and enteric tubes are partially visualized. There is complete opacification of the visualized portion of the right lung. IMPRESSION: No acute intracranial pathology. No acute/traumatic cervical spine pathology. Complete opacification of the visualized portion of the right lung. A Electronically Signed   By: Anner Crete M.D.   On: 03/26/2015 00:32   Ct Cervical Spine Wo Contrast  03/26/2015  CLINICAL DATA:  45 year old female with trauma EXAM: CT HEAD WITHOUT CONTRAST CT CERVICAL SPINE WITHOUT CONTRAST TECHNIQUE: Multidetector CT imaging of the head and cervical spine was performed following the standard protocol without intravenous contrast. Multiplanar CT image reconstructions of the cervical spine were also generated. COMPARISON:  Head CT dated 06/10/2014 and MRI dated 06/20/2014 FINDINGS: CT HEAD FINDINGS The ventricles and the sulci are appropriate in size for the patient's age. There is no intracranial hemorrhage. No midline shift or mass effect identified. The gray-white matter differentiation is preserved. There is mild mucoperiosteal thickening of paranasal sinuses. Multiple retention cysts or polyps noted in the right maxillary sinus measuring up to 1.5 cm. The mastoid air cells are well aerated. The calvarium is intact. Right forehead hematoma. CT CERVICAL SPINE FINDINGS There is no acute fracture or subluxation of the cervical spine.The intervertebral disc spaces are preserved.The odontoid and spinous processes are intact.There is normal anatomic alignment of the C1-C2 lateral masses. The visualized soft tissues appear unremarkable. An endotracheal and enteric tubes are partially visualized. There is complete  opacification of the visualized portion of the right lung. IMPRESSION: No acute intracranial pathology. No acute/traumatic cervical spine pathology. Complete opacification of the visualized portion of the right lung. A Electronically Signed   By: Anner Crete M.D.   On: 03/26/2015 00:32   Mr Brain Wo Contrast  03/26/2015  CLINICAL DATA:  Lung cancer with known brain metastasis treated with radiation. New onset seizures today. History of hypertension. EXAM: MRI HEAD WITHOUT CONTRAST TECHNIQUE: Multiplanar, multiecho pulse sequences of the brain and surrounding structures were obtained without intravenous contrast. No gadolinium administered due to low GFR, which is new from prior sampling COMPARISON:  CT head March 25, 2015 and MRI of the brain January 12, 2015 FINDINGS: The ventricles and sulci are normal for patient's age. No abnormal parenchymal signal, mass effect. No reduced diffusion to suggest acute ischemia, postictal state or, hypercellular mass. Rounded FLAIR T2 hyperintense signal LEFT mesial cerebellum and LEFT posterior temporal lobe corresponding to known intracranial metastasis. No susceptibility artifact to suggest hemorrhage. No abnormal extra-axial fluid collections. No extra-axial masses though, contrast enhanced sequences would be  more sensitive. Normal major intracranial vascular flow voids seen at the skull base. Ocular globes and orbital contents are nonsuspicious though not tailored for evaluation. Old LEFT medial orbital blowout fracture. No abnormal sellar expansion. No suspicious calvarial bone marrow signal. Craniocervical junction maintained. Mild paranasal sinus mucosal thickening with small RIGHT maxillary air-fluid level. The mastoid air cells are well aerated. Large RIGHT frontal and LEFT parietal scalp hematomas. IMPRESSION: No acute intracranial process. Please note, contrast not administered due to low GFR. For assessment of metastasis, MRI of the brain with contrast could  be performed when patient's renal function improves. Mild vasogenic edema LEFT posterior temporal lobe, LEFT cerebellum corresponding to known metastasis. Larger RIGHT frontal, LEFT parietal scalp hematomas. Moderate paranasal sinusitis. Electronically Signed   By: Elon Alas M.D.   On: 03/26/2015 02:37   Dg Chest Port 1 View  03/25/2015  CLINICAL DATA:  Endotracheal tube placement.  Initial encounter. EXAM: PORTABLE CHEST 1 VIEW COMPARISON:  Chest radiograph performed 03/11/2015, and CTA of the chest performed 03/24/2015 FINDINGS: The patient's endotracheal tube is seen ending 2-3 cm above the carina. There is distention of the proximal trachea, thought to reflect mild overinflation of the endotracheal tube balloon. An enteric tube is noted extending below the diaphragm. Dense right upper lobe airspace opacification is noted, and bilateral perihilar airspace opacity, significantly worsened from the recent prior study and concerning for multifocal pneumonia. A small right pleural effusion is noted. No definite pneumothorax is seen. The cardiomediastinal silhouette is borderline normal in size. No acute osseous abnormalities are identified. IMPRESSION: 1. Endotracheal tube seen ending 2-3 cm above the carina. Distention of the proximal trachea is thought to reflect mild overinflation of the endotracheal tube balloon. This should be slightly decompressed. 2. Significantly worsened dense right upper lobe airspace opacification and bilateral perihilar opacity, concerning for worsening multifocal pneumonia. Small right pleural effusion noted. These results were called by telephone at the time of interpretation on 03/25/2015 at 11:34 pm to Dr. Carmin Muskrat, who verbally acknowledged these results. Electronically Signed   By: Garald Balding M.D.   On: 03/25/2015 23:35   STUDIES:  As above  CULTURES: Blood cx 03/25/15 >> no growth in <12 hrs  Urine cx 03/25/15 >>  ANTIBIOTICS: Vanc 03/25/15 >> Zosyn  03/25/15 >>  SIGNIFICANT EVENTS: n/a  LINES/TUBES: ETT 03/25/15 Foley 03/25/15  DISCUSSION: Mrs. Feehan is a 49 F w/ stage iv adenoca of lung origin with mets to brain s/p stereotactic radiosurgery to new cerebellar lesion in 01/2015 who presents with new onset seizure activity. She was recently started on levaquin for a presumed pneumonia, which may have lowered her seizure threshold. She was unable to protect her airway on arrival to the ED and was intubated. She has been covered broadly with antibiotics for possible pneumonia and will be loaded with Keppra per neuro-hospitalist.  ASSESSMENT / PLAN:  PULMONARY A: Acute hypercapneic respiratory failure 2/2 seizure with inability to protect airway Presumed aspiration event Recent pneumonia dx Smoking R lung NSCLC, likely inactive in chest at this time.   P:  Continue mechanical ventilation  Wean as tolerated DuoNebs > change to scheduled Continue antibiotics   CARDIOVASCULAR A:  No acute issues  P:  monitor  RENAL A:  Acute kidney injury - etiology unclear  P:  Trend Cr  Avoid nephrotoxins Pharmacy to assist with dosing  GASTROINTESTINAL A:  Mild abdominal distention and hypoactive BS - likely from constipation   P:  Stress ulcer ppx Dulcolax suppository prn  If no improvement, consider ordering imaging of abdomen   HEMATOLOGIC A:  DVT ppx  P:  Continue Enoxaparin  INFECTIOUS A:  Concern for post-obstructive pneumonia Probable aspiration  P:  Continue vanc/zosyn for now F/u cultures   ENDOCRINE A:  Hyperglycemia (resolved) - likely related to seizures  P:  Check A1c Accuchecks / SSI  NEUROLOGIC A:  Possible new-onset seizures with altered mental status, ? Decrease seizure threshold in setting recent infection. Also note that her dexamethasone was stopped on 03/22/15. EEG (1/16) did not show seizure activity.  Hx brain metastases (temporal lobe, cerebellum) s/p  radiation therapy Concern for cervical injury during fall  P:  Continue sedation with Propofol gtt, Fentanyl gtt, Versed prn  Keppra  Dexamethasone  Neuro following; appreciate recs Maintain C-collar   Shela Leff, MD PGY 1 ([facility]) Pager [phone]   TODAY'S SUMMARY:

## 2015-03-28 ENCOUNTER — Inpatient Hospital Stay (HOSPITAL_COMMUNITY): Payer: Medicaid Other

## 2015-03-28 DIAGNOSIS — C7931 Secondary malignant neoplasm of brain: Secondary | ICD-10-CM | POA: Insufficient documentation

## 2015-03-28 DIAGNOSIS — Z7189 Other specified counseling: Secondary | ICD-10-CM

## 2015-03-28 DIAGNOSIS — Z515 Encounter for palliative care: Secondary | ICD-10-CM

## 2015-03-28 LAB — CBC
HEMATOCRIT: 33.8 % — AB (ref 36.0–46.0)
HEMATOCRIT: 31.1 % — AB (ref 36.0–46.0)
Hemoglobin: 11.2 g/dL — ABNORMAL LOW (ref 12.0–15.0)
Hemoglobin: 10 g/dL — ABNORMAL LOW (ref 12.0–15.0)
MCH: 28.9 pg (ref 26.0–34.0)
MCH: 29.8 pg (ref 26.0–34.0)
MCHC: 33.1 g/dL (ref 30.0–36.0)
MCHC: 32.2 g/dL (ref 30.0–36.0)
MCV: 87.3 fL (ref 78.0–100.0)
MCV: 92.6 fL (ref 78.0–100.0)
PLATELETS: 242 10*3/uL (ref 150–400)
Platelets: 220 10*3/uL (ref 150–400)
RBC: 3.87 MIL/uL (ref 3.87–5.11)
RBC: 3.36 MIL/uL — AB (ref 3.87–5.11)
RDW: 16.4 % — AB (ref 11.5–15.5)
RDW: 17.2 % — ABNORMAL HIGH (ref 11.5–15.5)
WBC: 13.4 10*3/uL — AB (ref 4.0–10.5)
WBC: 13.5 10*3/uL — AB (ref 4.0–10.5)

## 2015-03-28 LAB — BASIC METABOLIC PANEL
ANION GAP: 5 (ref 5–15)
Anion gap: 11 (ref 5–15)
BUN: 14 mg/dL (ref 6–20)
BUN: 16 mg/dL (ref 6–20)
CHLORIDE: 105 mmol/L (ref 101–111)
CO2: 28 mmol/L (ref 22–32)
CO2: 27 mmol/L (ref 22–32)
CREATININE: 0.7 mg/dL (ref 0.44–1.00)
Calcium: 8.6 mg/dL — ABNORMAL LOW (ref 8.9–10.3)
Calcium: 8.7 mg/dL — ABNORMAL LOW (ref 8.9–10.3)
Chloride: 108 mmol/L (ref 101–111)
Creatinine, Ser: 0.76 mg/dL (ref 0.44–1.00)
GFR calc Af Amer: >60 mL/min (ref >60)
GFR calc non Af Amer: >60 mL/min (ref >60)
GLUCOSE: 138 mg/dL — AB (ref 65–99)
Glucose, Bld: 138 mg/dL — ABNORMAL HIGH (ref 65–99)
POTASSIUM: 5.3 mmol/L — AB (ref 3.5–5.1)
POTASSIUM: 4.7 mmol/L (ref 3.5–5.1)
Sodium: 141 mmol/L (ref 135–145)
Sodium: 143 mmol/L (ref 135–145)

## 2015-03-28 LAB — BLOOD GAS, ARTERIAL
Acid-Base Excess: 2.4 mmol/L — ABNORMAL HIGH (ref 0.0–2.0)
Acid-Base Excess: 2.7 mmol/L — ABNORMAL HIGH (ref 0.0–2.0)
Bicarbonate: 28.3 mEq/L — ABNORMAL HIGH (ref 20.0–24.0)
Bicarbonate: 27.5 mEq/L — ABNORMAL HIGH (ref 20.0–24.0)
DRAWN BY: 42624
DRAWN BY: 42624
FIO2: 0.5
FIO2: 0.5
MECHVT: 0.38 mL
MECHVT: 380 mL
O2 SAT: 99.3 %
O2 Saturation: 99 %
PCO2 ART: 48.7 mmHg — AB (ref 35.0–45.0)
PEEP: 8 cmH2O
PEEP: 8 cmH2O
PH ART: 7.37 (ref 7.350–7.450)
PO2 ART: 169 mmHg — AB (ref 80.0–100.0)
Patient temperature: 98.6
Patient temperature: 98.6
RATE: 16 resp/min
RATE: 22 resp/min
TCO2: 30.1 mmol/L (ref 0–100)
TCO2: 29 mmol/L (ref 0–100)
pCO2 arterial: 60.1 mmHg (ref 35.0–45.0)
pH, Arterial: 7.295 — ABNORMAL LOW (ref 7.350–7.450)
pO2, Arterial: 161 mmHg — ABNORMAL HIGH (ref 80.0–100.0)

## 2015-03-28 LAB — GLUCOSE, CAPILLARY
GLUCOSE-CAPILLARY: 148 mg/dL — AB (ref 65–99)
GLUCOSE-CAPILLARY: 112 mg/dL — AB (ref 65–99)
GLUCOSE-CAPILLARY: 119 mg/dL — AB (ref 65–99)
Glucose-Capillary: 116 mg/dL — ABNORMAL HIGH (ref 65–99)
Glucose-Capillary: 125 mg/dL — ABNORMAL HIGH (ref 65–99)
Glucose-Capillary: 135 mg/dL — ABNORMAL HIGH (ref 65–99)

## 2015-03-28 LAB — TRIGLYCERIDES: Triglycerides: 78 mg/dL (ref <150)

## 2015-03-28 LAB — PHOSPHORUS
PHOSPHORUS: 2.9 mg/dL (ref 2.5–4.6)
Phosphorus: 3.1 mg/dL (ref 2.5–4.6)
Phosphorus: 2.3 mg/dL — ABNORMAL LOW (ref 2.5–4.6)

## 2015-03-28 LAB — MRSA PCR SCREENING: MRSA BY PCR: NEGATIVE

## 2015-03-28 LAB — MAGNESIUM
MAGNESIUM: 2.1 mg/dL (ref 1.7–2.4)
Magnesium: 2.4 mg/dL (ref 1.7–2.4)
Magnesium: 2.2 mg/dL (ref 1.7–2.4)

## 2015-03-28 MED ORDER — PRO-STAT SUGAR FREE PO LIQD
30.0000 mL | Freq: Every day | ORAL | Status: DC
  Filled 2015-03-28: qty 30

## 2015-03-28 MED ORDER — VITAL HIGH PROTEIN PO LIQD
1000.0000 mL | ORAL | Status: DC
  Administered 2015-03-29: 1000 mL

## 2015-03-28 MED ORDER — ADULT MULTIVITAMIN LIQUID CH
5.0000 mL | Freq: Every day | ORAL | Status: DC
  Administered 2015-03-28: 5 mL
  Filled 2015-03-28 (×2): qty 5

## 2015-03-28 MED ORDER — HALOPERIDOL LACTATE 5 MG/ML IJ SOLN
5.0000 mg | Freq: Four times a day (QID) | INTRAMUSCULAR | Status: DC | PRN
  Administered 2015-03-29: 5 mg via INTRAVENOUS
  Filled 2015-03-28 (×2): qty 1

## 2015-03-28 MED ORDER — SODIUM PHOSPHATE 3 MMOLE/ML IV SOLN
40.0000 meq | Freq: Once | INTRAVENOUS | Status: DC
  Filled 2015-03-28: qty 10

## 2015-03-28 MED ORDER — ADULT MULTIVITAMIN W/MINERALS CH
1.0000 | ORAL_TABLET | Freq: Every day | ORAL | Status: DC
  Administered 2015-03-29 – 2015-04-01 (×4): 1
  Filled 2015-03-28 (×4): qty 1

## 2015-03-28 MED ORDER — SODIUM POLYSTYRENE SULFONATE 15 GM/60ML PO SUSP
45.0000 g | Freq: Once | ORAL | Status: AC
  Administered 2015-03-28: 45 g
  Filled 2015-03-28 (×3): qty 180

## 2015-03-28 MED ORDER — PRO-STAT SUGAR FREE PO LIQD
60.0000 mL | Freq: Three times a day (TID) | ORAL | Status: DC
  Filled 2015-03-28 (×2): qty 60

## 2015-03-28 MED ORDER — POLYETHYLENE GLYCOL 3350 17 G PO PACK
17.0000 g | PACK | Freq: Every day | ORAL | Status: DC | PRN
  Filled 2015-03-28: qty 1

## 2015-03-28 MED ORDER — SODIUM PHOSPHATE 3 MMOLE/ML IV SOLN
20.0000 meq | Freq: Once | INTRAVENOUS | Status: AC
  Administered 2015-03-28: 20 meq via INTRAVENOUS
  Filled 2015-03-28: qty 5

## 2015-03-28 MED ORDER — PRO-STAT SUGAR FREE PO LIQD
60.0000 mL | Freq: Three times a day (TID) | ORAL | Status: DC
  Administered 2015-03-28 – 2015-03-29 (×3): 60 mL
  Filled 2015-03-28 (×5): qty 60

## 2015-03-28 NOTE — Progress Notes (Signed)
Nutrition Follow-up  DOCUMENTATION CODES:   Obesity unspecified  INTERVENTION:  Change tube feeding of Vital High Protein to goal rate of 10 ml/h (240 ml per day). Provide 30 ml of pro-stat 7 times times per day. to provide 940 kcals, 126 gm protein, 202 ml free water daily. Propofol at 32.8 ml/hr provides 866 kcal per 24 hours. TF regimen plus propofol will provide 1806 kcal (124% of estimated needs) and 126 grams of protein (99% of estimated needs).    NUTRITION DIAGNOSIS:   Inadequate oral intake related to inability to eat as evidenced by NPO status.  Ongoing  GOAL:   Provide needs based on ASPEN/SCCM guidelines  Unmet  MONITOR:   I & O's, Diet advancement, Vent status, Labs, Skin  REASON FOR ASSESSMENT:   Ventilator, Consult Enteral/tube feeding initiation and management  ASSESSMENT:   Destiny [name] is an unfortunate 28F w/ history of stage iv nsclc (adeno) w/ mets to temporal lobe diagnosed in April 2016 who most recently was treated with SRS to a cerebellar lesion in 01/2015. She was seen by radiation oncology on 03/22/15 and dexamethasone was discontinued with plans for repeat MRI in one month. On 1/14, she presented to the ED with complaints of right-sided chest pain and non-productive cough. A CTA PE protocol was obtained and negative for PE but did show some right-sided infiltrates. She was sent home with a course of levaquin.  RD consulted for tube feeding management. Tube feedings of Vital High Protein were initiated yesterday at 0900 hr at 25 ml/hr and advanced to 40 ml/hr at 0700 hr this morning. Per RN, pt is to remain on propofol through today. Per family at bedside, pt was eating very well PTA.   Patient is currently intubated on ventilator support MV: 8.8 L/min Temp (24hrs), Avg:97.7 F (36.5 C), Min:97.2 F (36.2 C), Max:98.8 F (37.1 C)  Propofol: 32.8 ml ml/hr   Diet Order:   NPO  Skin:  Reviewed, no issues  Last BM:  PTA  Height:   Ht  Readings from Last 1 Encounters:  03/26/15 '5\' 8"'$  (1.727 m)    Weight:   Wt Readings from Last 1 Encounters:  03/28/15 229 lb 4.8 oz (104.01 kg)    Ideal Body Weight:  63.63 kg  BMI:  Body mass index is 34.87 kg/(m^2).  Estimated Nutritional Needs:   Kcal:  5997-7414  Protein:  >/=127  Fluid:  >/=1.2 L/day  EDUCATION NEEDS:   No education needs identified at this time  [place], LDN Inpatient Clinical Dietitian Pager: [phone] After Hours Pager: [phone]

## 2015-03-28 NOTE — Progress Notes (Signed)
Patient does not qualify for MINE study. Will restart Haldol prn.

## 2015-03-28 NOTE — Progress Notes (Signed)
CRITICAL VALUE ALERT  Critical value received:  PCO2 60.1  Date of notification:  03/28/15  Time of notification:  0405  Critical value read back: yes  Nurse who received alert:  Deboraha Sprang, RN  MD notified (1st page):  Dr. Posey Pronto  Time of first page:  [phone]

## 2015-03-28 NOTE — Progress Notes (Signed)
Pt's rate on ventilator increased to 22 by Dr. Posey Pronto

## 2015-03-28 NOTE — Progress Notes (Signed)
PULMONARY / CRITICAL CARE MEDICINE   Name: Destiny [name] MRN: [id number] DOB: May 21, 1970    SUBJECTIVE:  Sedated; on vent Vitals are stable As per nursing staff, patient is agitated only when disturbed. Currently on Propofol gtt, Fentanyl gtt, and Versed prn. Patient continues to be constipated.  Overnight, ABG showed pH 7.29, pCO2 60, pO2 161. Vent settings were changed. RR increased to 22.   VITAL SIGNS: Temp:  [97 F (36.1 C)-98.3 F (36.8 C)] 97.9 F (36.6 C) (01/18 0600) Pulse Rate:  [62-85] 62 (01/18 0600) Resp:  [12-22] 22 (01/18 0600) BP: (92-115)/(58-72) 104/63 mmHg (01/18 0600) SpO2:  [98 %-100 %] 100 % (01/18 0600) FiO2 (%):  [50 %] 50 % (01/18 0400) Weight:  [104.01 kg (229 lb 4.8 oz)] 104.01 kg (229 lb 4.8 oz) (01/18 0357) HEMODYNAMICS:   VENTILATOR SETTINGS: Vent Mode:  [-] PRVC FiO2 (%):  [50 %] 50 % Set Rate:  [16 bmp-22 bmp] 22 bmp Vt Set:  [380 mL] 380 mL PEEP:  [8 cmH20] 8 cmH20 Plateau Pressure:  [13 cmH20-20 cmH20] 19 cmH20 INTAKE / OUTPUT: Intake/Output      01/17 0701 - 01/18 0700   I.V. (mL/kg) 1391.2 (13.4)   NG/GT 550   IV Piggyback 622.5   Total Intake(mL/kg) 2563.7 (24.6)   Urine (mL/kg/hr) 1345 (0.5)   Total Output 1345   Net +1218.7         PHYSICAL EXAMINATION:  PHYSICAL EXAMINATION: General: on vent  Neuro: Sedated HEENT:ETT in place, OGT in place, cervical collar in place.  Cardiovascular:RRR, s1/s2, no m/r/g Lungs: Anterior lung fields clear to auscultation bilaterally. Vent-assisted. Symmetrical expansion.  Abdomen: Obese, + bowel sounds, ND  Musculoskeletal: No gross deformities. Trace bipedal edema.   Skin: Warm, dry, intact. No rashes.    LABS:  CBC  Recent Labs Lab 03/26/15 0247 03/27/15 0244 03/28/15 0413  WBC 13.4* 12.6* 13.5*  HGB 11.2* 10.8* 10.0*  HCT 33.8* 33.5* 31.1*  PLT 242 244 220   Coag's  Recent Labs Lab 03/25/15 2223  INR 1.22   BMET  Recent Labs Lab 03/26/15 0247  03/27/15 0244 03/28/15 0413  NA 140 140 141  K 3.9 4.7 5.3*  CL 109 106 108  CO2 '22 23 28  '$ BUN '14 13 14  '$ CREATININE 1.13* 0.91 0.76  GLUCOSE 98 145* 138*   Electrolytes  Recent Labs Lab 03/26/15 0247 03/27/15 0244 03/27/15 1140 03/27/15 2242 03/28/15 0413  CALCIUM 8.1* 8.7*  --   --  8.6*  MG 2.3  --  2.7* 2.4  --   PHOS 3.5  --  3.6 3.1  --    Sepsis Markers  Recent Labs Lab 03/25/15 2228 03/25/15 2344 03/26/15 0255 03/27/15 0244  LATICACIDVEN >17.00* 7.09* 2.5*  --   PROCALCITON  --   --   --  5.22   ABG  Recent Labs Lab 03/27/15 0252 03/28/15 0344 03/28/15 0625  PHART 7.295* 7.295* 7.370  PCO2ART 47.7* 60.1* 48.7*  PO2ART 100.0 161* 169*   Liver Enzymes  Recent Labs Lab 03/24/15 1043 03/25/15 2223 03/26/15 0247  AST 22 59* 72*  ALT 31 46 55*  ALKPHOS 73 101 72  BILITOT 0.5 0.4 0.4  ALBUMIN 3.5 3.4* 3.0*   Cardiac Enzymes No results for input(s): TROPONINI, PROBNP in the last 168 hours. Glucose  Recent Labs Lab 03/27/15 0742 03/27/15 1128 03/27/15 1525 03/27/15 2021 03/27/15 2332 03/28/15 0346  GLUCAP 123* 132* 131* 132* 135* 116*    Imaging Dg Chest  Port 1 View  03/27/2015  CLINICAL DATA:  Intubation. EXAM: PORTABLE CHEST 1 VIEW COMPARISON:  03/25/2015. FINDINGS: Endotracheal tube and NG tube in stable position. Cardiomegaly. Persistent but improving bilateral pulmonary infiltrates/edema. Small left pleural effusion. No pneumothorax IMPRESSION: 1. Lines and tubes in stable position. 2. Cardiomegaly with persistent but significantly improved bilateral pulmonary infiltrates/edema. Small left pleural effusion . Electronically Signed   By: Marcello Moores  Register   On: 03/27/2015 07:22   STUDIES:  As above  CULTURES: Blood cx 03/25/15 >> no growth in 2 days  Urine cx 03/25/15 >>  ANTIBIOTICS: Vanc 03/25/15 >> Zosyn 03/25/15 >>  SIGNIFICANT EVENTS: Admitted and intubated on 1/15.   LINES/TUBES: ETT 03/25/15 Foley 03/25/15  CXR 1/18:  Stable focal patchy consolidation of the right mid lung. Stable mild cardiomegaly without pulmonary edema.   DISCUSSION: Mrs. 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 She was recently started on levaquin for a presumed pneumonia, which may have lowered her seizure threshold. She was unable to protect her airway on arrival to the ED and was intubated. She has been covered broadly with antibiotics for possible pneumonia and will be continued on Keppra.    ASSESSMENT / PLAN:  PULMONARY A: Acute hypercapneic respiratory failure 2/2 seizure with inability to protect airway Presumed aspiration event Recent pneumonia dx Smoking R lung NSCLC, likely inactive in chest at this time.   P:  Continue mechanical ventilation  Wean as tolerated DuoNebs  Continue antibiotics   CARDIOVASCULAR A:  No acute issues  P:  monitor  RENAL A:  Acute kidney injury (resolved)  P:   Avoid nephrotoxins Pharmacy to assist with dosing  GASTROINTESTINAL A:  Constipation   P:  Stress ulcer ppx Dulcolax suppository prn    Miralax prn   HEMATOLOGIC A:  DVT ppx  P:  Continue Enoxaparin  INFECTIOUS A:  Concern for post-obstructive pneumonia Probable aspiration  P:  Continue vanc/zosyn for now F/u cultures   ENDOCRINE A:  Hyperglycemia (resolved) - likely related to seizures Hyperkalemia - K 5.3 P:  Check A1c Accuchecks / SSI Kayexalate  Monitor BMP   NEUROLOGIC A:  Possible new-onset seizures with altered mental status, ? Decrease seizure threshold in setting recent infection. Also note that her dexamethasone was stopped on 03/22/15. EEG (1/16) did not show seizure activity.  Hx brain metastases (temporal lobe, cerebellum) s/p radiation therapy Concern for cervical injury during fall  P:  Continue sedation  with Propofol gtt, Fentanyl gtt, Versed prn, Haldol  Keppra  Dexamethasone  Maintain C-collar  Family updates: 03/26/15 - patient's brother was updated about her status over the phone. 03/27/15 - patient's brother was updated about her status over the phone. The brother was agreeable to palliative care consult. Also updated patient's daughter at bedside about her current status and daughter also agreeable to palliative care consult.    Shela Leff, MD PGY 1 ([facility]) Pager [phone]   TODAY'S SUMMARY:

## 2015-03-28 NOTE — Progress Notes (Signed)
Upon entering room, pt very agitated- moving around bed, HR 120's.  RN gave sedation bolus.  SBT not attempted at this time d/t agitation and re-sedation.

## 2015-03-28 NOTE — Consult Note (Signed)
Consultation Note Date: 03/28/2015   Patient Name: Destiny [name]  DOB: 07/16/1970  MRN: [id number]  Age / Sex: 45 y.o., female  PCP: [place] Referring Physician: Brand Males, MD  Reason for Consultation: Establishing goals of care  Clinical Assessment/Narrative:  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 She was seen by radiation oncology on 03/22/15 and dexamethasone was discontinued with plans for repeat MRI in one month. On 1/14, she presented to the ED with complaints of right-sided chest pain and non-productive cough. A CTA PE protocol was obtained and negative for PE but did show some right-sided infiltrates. She was sent home with a course of levaquin.   This evening (03/25/15), her family returned home and found her unresponsive on the floor. EMS was called and found her to be seizing with "left-sided gaze, fixed and dilated pupils and a hematoma on her right forehead". They gave a total of 7.17m versed and 2 of narcan with no change in her status. On arrival to the ED she was intubated for airway protection.  Per report she did withdraw to noxious stimuli after arrival to the ED.   Family is faced with advanced directive decisions, questions of HPOA/decsion maker, and anticipatory care needs  I have met Destiny [name] the OP radiation-oncology clinic in Nov/2016 and at that time she was accompanied by Destiny [name] his son Destiny [name]  Today i spoke with Destiny [name](self reported husband Destiny [name]# [phone], and  Destiny [name] (patient's aunt #[phone]  to discuss diagnosis, prognosis, GOC,disposition and options.  I also spoke with her brother Destiny [name] telephone and he tells me that Destiny [place] husband.  We discussed the imporatnce of family coming together for a discussion regarding [name] current medcial situation and and clarification of her GAlden.  The difference between a aggressive medical intervention path  and a palliative comfort care path for this patient at this time was had.  Values and goals of care important to patient and family were attempted to be elicited.  Decision for a family meeting made for Friday 03-30-15 at 1200 noon confirmed.   There are other family members who would like to be present; sons Destiny [name], Destiny [name] Destiny Malbroughand daughter Destiny [name]    Questions and concerns addressed.  Family encouraged to call with questions or concerns.  PMT will continue to support holistically.  Primary Decision Maker: There has been much discussion around who is the designated dMedia planner      HCPOA: none docuemnted    SUMMARY OF RECOMMENDATIONS  -continue current medical treatment plan, family is hopeful for improvement and return to baseline   Code Status/Advance Care Planning:  Full code      Code Status Orders        Start     Ordered   03/26/15 0101  Full code   Continuous     03/26/15 0103    Code Status History    Date Active Date Inactive Code Status Order ID Comments User Context   09/08/2014  9:49 PM 09/11/2014  3:53 PM Full Code [id number] HTheressa Millard MD Inpatient   06/14/2014 10:48 AM 06/15/2014  9:33 PM Full Code [id number] GAletta Edouard MD Inpatient   06/10/2014 11:26 PM 06/14/2014  10:48 AM Full Code [id number]  Lavina Hamman, MD ED   01/16/2014  3:50 PM 01/16/2014  9:56 PM Full Code [id number]  Evans Lance, MD Inpatient      Other Directives:None   Psycho-social/Spiritual:  Support System: [name]    Chief Complaint/ Primary Diagnoses: Present on Admission:  . Altered mental status  I have reviewed the medical record, interviewed the patient and family, and examined the patient. The following aspects are  pertinent.  Past Medical History  Diagnosis Date  . Thyroid nodule   . Depression   . Suicide attempt ([place])   . SVT (supraventricular tachycardia) ([place])     a. Long RP tachycardia;  b. 01/2014 s/p RFCA.  . Metastatic lung cancer (metastasis from lung to other site) ([place]) 06/2014    Stage IV (T3, N2, M1b) non-small cell lung cancer, adenocarcinoma; treated with chemo radiation (radiation completed 09/04/14).  Brain met treated with radiation 06/2014.   . Brain metastases ([place])     left brain met treated with radiation 06/2014. MRI 7/27 with new right brain met and shrunken left met.   . Hypertension    Social History   Social History  . Marital Status: Married    Spouse Name: N/A  . Number of Children: 4  . Years of Education: N/A   Social History Main Topics  . Smoking status: Current Every Day Smoker -- 1.00 packs/day for 30 years    Types: Cigarettes    Last Attempt to Quit: 06/13/2014  . Smokeless tobacco: Current User     Comment: form given 12/29/14, vapor  . 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 Drug Use: No  . Sexual Activity: Yes   Other Topics Concern  . None   Social History Narrative   Family History  Problem Relation Age of Onset  . Seizures Mother   . Colon cancer Father   . Hypertension Father   . Diabetes Maternal Grandmother   . Diabetes Sister   . Kidney disease Neg Hx   . Liver disease Neg Hx   . Stomach cancer Neg Hx   . Esophageal cancer Neg Hx   . Thyroid cancer Sister    Scheduled Meds: . antiseptic oral rinse  7 mL Mouth Rinse QID  . chlorhexidine gluconate  15 mL Mouth Rinse BID  . dexamethasone  4 mg Intravenous 4 times per day  . enoxaparin (LOVENOX) injection  60 mg Subcutaneous Q24H  . famotidine  40 mg Per Tube BID  . feeding supplement (PRO-STAT SUGAR FREE 64)  60 mL Per Tube TID BM  . [START ON 03/29/2015] feeding supplement (VITAL HIGH PROTEIN)  1,000 mL Per Tube Q24H  . insulin aspart  2-6 Units  Subcutaneous 6 times per day  . ipratropium-albuterol  3 mL Nebulization Q6H  . levETIRAcetam  500 mg Intravenous Q12H  . multivitamin  5 mL Per Tube Daily  . piperacillin-tazobactam (ZOSYN)  IV  3.375 g Intravenous Q8H   Continuous Infusions: . fentaNYL infusion INTRAVENOUS 250 mcg/hr (03/28/15 1100)  . propofol (DIPRIVAN) infusion 50 mcg/kg/min (03/28/15 1158)   PRN Meds:.sodium chloride, bisacodyl, haloperidol lactate, midazolam, midazolam, polyethylene glycol Medications Prior to Admission:  Prior to Admission medications   Medication Sig Start Date End Date Taking? Authorizing Provider  acetaminophen (TYLENOL) 80 MG chewable tablet Chew 80 mg by mouth every 6 (six) hours as needed for mild pain.   Yes Historical Provider,  MD  azithromycin (ZITHROMAX Z-PAK) 250 MG tablet Take as directed 02/19/15  Yes Nicholas Lose, MD  buPROPion (WELLBUTRIN SR) 150 MG 12 hr tablet Take 1 tablet (150 mg total) by mouth 2 (two) times daily. 02/13/15  Yes Nicholas Lose, MD  clonazePAM (KLONOPIN) 0.5 MG tablet Take 1 tablet (0.5 mg total) by mouth 2 (two) times daily as needed for anxiety. Patient taking differently: Take 0.5 mg by mouth daily.  11/22/14  Yes Carlton Adam, PA-C  dexamethasone (DECADRON) 2 MG tablet  01/31/15  Yes Historical Provider, MD  folic acid (FOLVITE) 1 MG tablet Take 1 tablet (1 mg total) by mouth daily. Start 5-7 days before Alimta chemotherapy. Continue until 21 days after Alimta completed. 02/13/15  Yes Nicholas Lose, MD  guaiFENesin-codeine 100-10 MG/5ML syrup Take 10 mLs by mouth 3 (three) times daily as needed for cough. 03/11/15  Yes Virgel Manifold, MD  HYDROcodone-acetaminophen (NORCO/VICODIN) 5-325 MG tablet Take 1-2 tablets by mouth every 4 (four) hours as needed. 03/24/15  Yes Benjamin Cartner, PA-C  ibuprofen (ADVIL,MOTRIN) 200 MG tablet Take 400 mg by mouth every 6 (six) hours as needed for moderate pain. Reported on 03/22/2015   Yes Historical Provider, MD  levofloxacin  (LEVAQUIN) 750 MG tablet Take 1 tablet (750 mg total) by mouth daily. 03/24/15  Yes Benjamin Cartner, PA-C  mirtazapine (REMERON) 30 MG tablet Take 30 mg by mouth at bedtime as needed (depression, sleep). Reported on 03/22/2015 11/09/14  Yes Historical Provider, MD  pantoprazole (PROTONIX) 40 MG tablet Take 40 mg by mouth 2 (two) times daily before a meal. Reported on 03/22/2015 08/28/14 08/28/15 Yes Historical Provider, MD  dexamethasone (DECADRON) 4 MG tablet Take 1 tablet (4 mg total) by mouth 2 (two) times daily. Patient not taking: Reported on 03/22/2015 02/13/15   Tyler Pita, MD  dextromethorphan 15 MG/5ML syrup Take 10 mLs (30 mg total) by mouth 4 (four) times daily as needed for cough. Patient not taking: Reported on 03/22/2015 02/19/15   Nicholas Lose, MD  hydrOXYzine (VISTARIL) 25 MG capsule Take 1 capsule (25 mg total) by mouth 3 (three) times daily as needed for itching or anxiety. Patient not taking: Reported on 01/02/2015 11/22/14   Carlton Adam, PA-C  lidocaine (XYLOCAINE) 2 % solution Swallow 5 ml 15 minutes before meals Patient not taking: Reported on 01/02/2015 12/29/14   Amy S Esterwood, PA-C  ondansetron (ZOFRAN) 8 MG tablet Take 1 tablet (8 mg total) by mouth 2 (two) times daily. Start the day after chemo for 2 days. Then take as needed for nausea and vomiting. Patient not taking: Reported on 03/22/2015 02/13/15   Nicholas Lose, MD  sucralfate (CARAFATE) 1 GM/10ML suspension Take 10 mLs (1 g total) by mouth 4 (four) times daily -  with meals and at bedtime. Patient not taking: Reported on 01/02/2015 10/23/14   Susanne Borders, NP   No Known Allergies  Review of Systems  Unable to perform ROS   Physical Exam  Vital Signs: BP 100/60 mmHg  Pulse 61  Temp(Src) 98.4 F (36.9 C) (Oral)  Resp 22  Ht 5' 8"  (1.727 m)  Wt 104.01 kg (229 lb 4.8 oz)  BMI 34.87 kg/m2  SpO2 100%  LMP 03/16/2015  SpO2: SpO2: 100 % O2 Device:SpO2: 100 % O2 Flow Rate: .   IO: Intake/output  summary:  Intake/Output Summary (Last 24 hours) at 03/28/15 1329 Last data filed at 03/28/15 1300  Gross per 24 hour  Intake 3033.97 ml  Output  2485 ml  Net 548.97 ml    LBM: Last BM Date:  (pta) Baseline Weight: Weight: 109.4 kg (241 lb 2.9 oz) Most recent weight: Weight: 104.01 kg (229 lb 4.8 oz)      Palliative Assessment/Data:  Flowsheet Rows        Most Recent Value   Intake Tab    Referral Department  Critical care   Unit at Time of Referral  ICU   Palliative Care Primary Diagnosis  Cancer   Date Notified  03/27/15   Palliative Care Type  New Palliative care   Reason for referral  Clarify Goals of Care   Date of Admission  03/25/15   # of days IP prior to Palliative referral  2   Clinical Assessment    Psychosocial & Spiritual Assessment    Palliative Care Outcomes       Additional Data Reviewed:  CBC:    Component Value Date/Time   WBC 13.5* 03/28/2015 0413   WBC 8.2 01/03/2015 1012   HGB 10.0* 03/28/2015 0413   HGB 12.5 01/03/2015 1012   HCT 31.1* 03/28/2015 0413   HCT 36.8 01/03/2015 1012   PLT 220 03/28/2015 0413   PLT 258 01/03/2015 1012   MCV 92.6 03/28/2015 0413   MCV 87.2 01/03/2015 1012   NEUTROABS 9.4* 03/25/2015 2223   NEUTROABS 6.5 01/03/2015 1012   LYMPHSABS 4.0 03/25/2015 2223   LYMPHSABS 1.0 01/03/2015 1012   MONOABS 1.3* 03/25/2015 2223   MONOABS 0.7 01/03/2015 1012   EOSABS 0.0 03/25/2015 2223   EOSABS 0.0 01/03/2015 1012   BASOSABS 0.0 03/25/2015 2223   BASOSABS 0.0 01/03/2015 1012   Comprehensive Metabolic Panel:    Component Value Date/Time   NA 141 03/28/2015 0413   NA 140 01/03/2015 1012   K 5.3* 03/28/2015 0413   K 3.7 01/03/2015 1012   CL 108 03/28/2015 0413   CO2 28 03/28/2015 0413   CO2 21* 01/03/2015 1012   BUN 14 03/28/2015 0413   BUN 8.5 01/03/2015 1012   CREATININE 0.76 03/28/2015 0413   CREATININE 0.7 01/03/2015 1012   GLUCOSE 138* 03/28/2015 0413   GLUCOSE 101 01/03/2015 1012   CALCIUM 8.6* 03/28/2015  0413   CALCIUM 9.4 01/03/2015 1012   AST 72* 03/26/2015 0247   AST 15 01/03/2015 1012   ALT 55* 03/26/2015 0247   ALT 17 01/03/2015 1012   ALKPHOS 72 03/26/2015 0247   ALKPHOS 84 01/03/2015 1012   BILITOT 0.4 03/26/2015 0247   BILITOT <0.30 01/03/2015 1012   PROT 5.9* 03/26/2015 0247   PROT 7.2 01/03/2015 1012   ALBUMIN 3.0* 03/26/2015 0247   ALBUMIN 3.5 01/03/2015 1012     Time In: 1000 Time Out: 1115 Time Total: 75 min Greater than 50%  of this time was spent counseling and coordinating care related to the above assessment and plan.  Signed by: Wadie Lessen, NP  Knox Royalty, NP  03/28/2015, 1:29 PM  Please contact Palliative Medicine Team phone at [phone] for questions and concerns.

## 2015-03-29 ENCOUNTER — Inpatient Hospital Stay (HOSPITAL_COMMUNITY): Payer: Medicaid Other

## 2015-03-29 LAB — BASIC METABOLIC PANEL
Anion gap: 10 (ref 5–15)
BUN: 18 mg/dL (ref 6–20)
CALCIUM: 8.6 mg/dL — AB (ref 8.9–10.3)
CO2: 32 mmol/L (ref 22–32)
CREATININE: 0.64 mg/dL (ref 0.44–1.00)
Chloride: 103 mmol/L (ref 101–111)
GFR calc Af Amer: >60 mL/min (ref >60)
GLUCOSE: 129 mg/dL — AB (ref 65–99)
Potassium: 3.7 mmol/L (ref 3.5–5.1)
SODIUM: 145 mmol/L (ref 135–145)

## 2015-03-29 LAB — CBC
HCT: 29.8 % — ABNORMAL LOW (ref 36.0–46.0)
Hemoglobin: 9.5 g/dL — ABNORMAL LOW (ref 12.0–15.0)
MCH: 29.1 pg (ref 26.0–34.0)
MCHC: 31.9 g/dL (ref 30.0–36.0)
MCV: 91.1 fL (ref 78.0–100.0)
PLATELETS: 248 10*3/uL (ref 150–400)
RBC: 3.27 MIL/uL — AB (ref 3.87–5.11)
RDW: 17.2 % — AB (ref 11.5–15.5)
WBC: 10.7 10*3/uL — ABNORMAL HIGH (ref 4.0–10.5)

## 2015-03-29 LAB — BLOOD GAS, ARTERIAL
ACID-BASE EXCESS: 8.1 mmol/L — AB (ref 0.0–2.0)
Bicarbonate: 32.9 mEq/L — ABNORMAL HIGH (ref 20.0–24.0)
DRAWN BY: 44956
FIO2: 0.4
LHR: 22 {breaths}/min
O2 SAT: 98.5 %
PATIENT TEMPERATURE: 98.6
PCO2 ART: 53.7 mmHg — AB (ref 35.0–45.0)
PEEP: 8 cmH2O
PH ART: 7.405 (ref 7.350–7.450)
PO2 ART: 126 mmHg — AB (ref 80.0–100.0)
TCO2: 34.6 mmol/L (ref 0–100)

## 2015-03-29 LAB — GLUCOSE, CAPILLARY
GLUCOSE-CAPILLARY: 116 mg/dL — AB (ref 65–99)
GLUCOSE-CAPILLARY: 127 mg/dL — AB (ref 65–99)
Glucose-Capillary: 102 mg/dL — ABNORMAL HIGH (ref 65–99)
Glucose-Capillary: 117 mg/dL — ABNORMAL HIGH (ref 65–99)
Glucose-Capillary: 127 mg/dL — ABNORMAL HIGH (ref 65–99)
Glucose-Capillary: 128 mg/dL — ABNORMAL HIGH (ref 65–99)
Glucose-Capillary: 138 mg/dL — ABNORMAL HIGH (ref 65–99)

## 2015-03-29 LAB — PHOSPHORUS: Phosphorus: 2.2 mg/dL — ABNORMAL LOW (ref 2.5–4.6)

## 2015-03-29 LAB — MAGNESIUM: MAGNESIUM: 1.9 mg/dL (ref 1.7–2.4)

## 2015-03-29 MED ORDER — FENTANYL CITRATE (PF) 100 MCG/2ML IJ SOLN: 12.5000 ug | INTRAMUSCULAR | Status: DC | PRN

## 2015-03-29 MED ORDER — POTASSIUM CHLORIDE 20 MEQ/15ML (10%) PO SOLN
20.0000 meq | ORAL | Status: AC
  Administered 2015-03-29 (×2): 20 meq
  Filled 2015-03-29: qty 15

## 2015-03-29 MED ORDER — METOPROLOL TARTRATE 1 MG/ML IV SOLN
2.5000 mg | Freq: Once | INTRAVENOUS | Status: AC
  Administered 2015-03-29: 2.5 mg via INTRAVENOUS
  Filled 2015-03-29: qty 5

## 2015-03-29 MED ORDER — FENTANYL CITRATE (PF) 100 MCG/2ML IJ SOLN: 100.0000 ug | INTRAMUSCULAR | Status: DC | PRN

## 2015-03-29 MED ORDER — ACETAMINOPHEN 650 MG RE SUPP: 650.0000 mg | Freq: Three times a day (TID) | RECTAL | Status: DC | PRN

## 2015-03-29 MED ORDER — ENOXAPARIN SODIUM 60 MG/0.6ML ~~LOC~~ SOLN
50.0000 mg | SUBCUTANEOUS | Status: DC
  Administered 2015-03-30 – 2015-03-31 (×2): 50 mg via SUBCUTANEOUS
  Filled 2015-03-29 (×2): qty 0.6

## 2015-03-29 MED ORDER — CHLORHEXIDINE GLUCONATE 0.12 % MT SOLN: 15.0000 mL | Freq: Two times a day (BID) | OROMUCOSAL | Status: DC

## 2015-03-29 MED ORDER — ACETAMINOPHEN 325 MG PO TABS
650.0000 mg | ORAL_TABLET | Freq: Three times a day (TID) | ORAL | Status: DC | PRN
  Administered 2015-03-29 – 2015-04-01 (×4): 650 mg via ORAL
  Filled 2015-03-29 (×4): qty 2

## 2015-03-29 MED ORDER — DEXMEDETOMIDINE HCL IN NACL 200 MCG/50ML IV SOLN: 0.4000 ug/kg/h | INTRAVENOUS | Status: DC

## 2015-03-29 MED ORDER — METOPROLOL TARTRATE 1 MG/ML IV SOLN
INTRAVENOUS | Status: AC
Start: 2015-03-29 — End: 2015-03-29
  Administered 2015-03-29: 5 mg
  Filled 2015-03-29: qty 5

## 2015-03-29 MED ORDER — CETYLPYRIDINIUM CHLORIDE 0.05 % MT LIQD: 7.0000 mL | Freq: Two times a day (BID) | OROMUCOSAL | Status: DC

## 2015-03-29 MED ORDER — METOPROLOL TARTRATE 1 MG/ML IV SOLN
5.0000 mg | Freq: Four times a day (QID) | INTRAVENOUS | Status: DC | PRN
  Administered 2015-03-29 – 2015-03-31 (×2): 5 mg via INTRAVENOUS
  Filled 2015-03-29: qty 5

## 2015-03-29 MED ORDER — METOPROLOL TARTRATE 1 MG/ML IV SOLN
INTRAVENOUS | Status: AC
  Filled 2015-03-29: qty 5

## 2015-03-29 MED ORDER — DEXMEDETOMIDINE HCL IN NACL 200 MCG/50ML IV SOLN
0.0000 ug/kg/h | INTRAVENOUS | Status: DC
  Administered 2015-03-29 (×2): 0.5 ug/kg/h via INTRAVENOUS
  Filled 2015-03-29 (×2): qty 50

## 2015-03-29 MED ORDER — FENTANYL CITRATE (PF) 100 MCG/2ML IJ SOLN
100.0000 ug | INTRAMUSCULAR | Status: DC | PRN
Start: 2015-03-29 — End: 2015-03-29

## 2015-03-29 MED ORDER — CETYLPYRIDINIUM CHLORIDE 0.05 % MT LIQD
7.0000 mL | Freq: Two times a day (BID) | OROMUCOSAL | Status: DC
  Administered 2015-03-29 – 2015-04-01 (×5): 7 mL via OROMUCOSAL

## 2015-03-29 NOTE — Progress Notes (Signed)
PULMONARY / CRITICAL CARE MEDICINE   Name: Destiny [name] MRN: [id number] DOB: January 24, 1971    VITAL SIGNS: Temp:  [98.1 F (36.7 C)-98.8 F (37.1 C)] 98.1 F (36.7 C) (01/19 0600) Pulse Rate:  [56-110] 56 (01/19 0600) Resp:  [20-22] 22 (01/19 0600) BP: (81-134)/(46-83) 112/67 mmHg (01/19 0600) SpO2:  [99 %-100 %] 100 % (01/19 0600) FiO2 (%):  [40 %] 40 % (01/19 0400) Weight:  [103.783 kg (228 lb 12.8 oz)] 103.783 kg (228 lb 12.8 oz) (01/19 0419) HEMODYNAMICS:   VENTILATOR SETTINGS: Vent Mode:  [-] PRVC FiO2 (%):  [40 %] 40 % Set Rate:  [22 bmp] 22 bmp Vt Set:  [380 mL] 380 mL PEEP:  [8 cmH20] 8 cmH20 Plateau Pressure:  [18 cmH20-22 cmH20] 21 cmH20 INTAKE / OUTPUT: Intake/Output      01/18 0701 - 01/19 0700   I.V. (mL/kg) 1450.7 (14)   NG/GT 340   IV Piggyback 577.5   Total Intake(mL/kg) 2368.2 (22.8)   Urine (mL/kg/hr) 3015 (1.2)   Total Output 3015   Net -646.8         PHYSICAL EXAMINATION:  PHYSICAL EXAMINATION: General: on vent  Neuro: Sedated HEENT:ETT in place, OGT in place. No c-spine tenderness on exam.     Cardiovascular:RRR, s1/s2, no m/r/g Lungs: Rhonchi. Vent-assisted breaths. Symmetrical expansion.  Abdomen: Obese, + bowel sounds, ND  Musculoskeletal: No gross deformities. Trace bipedal edema.   Skin: Warm, dry, intact. No rashes.    LABS:  CBC  Recent Labs Lab 03/27/15 0244 03/28/15 0413 03/29/15 0228  WBC 12.6* 13.5* 10.7*  HGB 10.8* 10.0* 9.5*  HCT 33.5* 31.1* 29.8*  PLT 244 220 248   Coag's  Recent Labs Lab 03/25/15 2223  INR 1.22   BMET  Recent Labs Lab 03/28/15 0413 03/28/15 1620 03/29/15 0228  NA 141 143 145  K 5.3* 4.7 3.7  CL 108 105 103  CO2 28 27 32  BUN '14 16 18  '$ CREATININE 0.76 0.70 0.64  GLUCOSE 138* 138* 129*   Electrolytes  Recent Labs Lab 03/27/15 2242 03/28/15 0413 03/28/15 0844 03/28/15 1620 03/28/15 2016 03/29/15 0228  CALCIUM  --  8.6*  --  8.7*  --  8.6*  MG 2.4  --  2.2  --   2.1  --   PHOS 3.1  --  2.3*  --  2.9  --    Sepsis Markers  Recent Labs Lab 03/25/15 2228 03/25/15 2344 03/26/15 0255 03/27/15 0244  LATICACIDVEN >17.00* 7.09* 2.5*  --   PROCALCITON  --   --   --  5.22   ABG  Recent Labs Lab 03/28/15 0344 03/28/15 0625 03/29/15 0345  PHART 7.295* 7.370 7.405  PCO2ART 60.1* 48.7* 53.7*  PO2ART 161* 169* 126*   Liver Enzymes  Recent Labs Lab 03/24/15 1043 03/25/15 2223 03/26/15 0247  AST 22 59* 72*  ALT 31 46 55*  ALKPHOS 73 101 72  BILITOT 0.5 0.4 0.4  ALBUMIN 3.5 3.4* 3.0*   Cardiac Enzymes No results for input(s): TROPONINI, PROBNP in the last 168 hours. Glucose  Recent Labs Lab 03/28/15 0735 03/28/15 1133 03/28/15 1511 03/28/15 2037 03/28/15 2340 03/29/15 0354  GLUCAP 148* 125* 112* 119* 127* 117*    Imaging Dg Chest Port 1 View  03/28/2015  CLINICAL DATA:  Intubated EXAM: PORTABLE CHEST 1 VIEW COMPARISON:  Chest radiograph from one day prior. FINDINGS: Endotracheal tube tip is 4.7 cm above the carina. Enteric tube enters the stomach with the tip not seen  on this image. Stable cardiomediastinal silhouette with mild cardiomegaly. No pneumothorax. No pleural effusion. There is stable focal patchy consolidation in the right mid lung. There is mild bibasilar atelectasis, decreased. No pulmonary edema. IMPRESSION: 1. Well-positioned endotracheal and enteric tubes . 2. Stable mild cardiomegaly without pulmonary edema. 3. Stable focal patchy consolidation in the right mid lung. Electronically Signed   By: Ilona Sorrel M.D.   On: 03/28/2015 07:38   STUDIES:  As above  CULTURES: Blood cx 03/25/15 >> no growth in 3 days  Urine cx 03/25/15 >>  ANTIBIOTICS: Vanc 03/25/15 >> Zosyn 03/25/15 >>  SIGNIFICANT EVENTS: 1/15>> Admitted and intubated.  1/16>> on vent, sedated. Low grade fevers and tachycardic  1/17>>on vent, sedated. Vitals stable but agitated despite being on propofol gtt and fentanyl gtt 1/18>>on vent, sedated.  Agitated when disturbed. On propofol gtt, fentanyl gtt, and versed gtt. Continues to be constipated.  1/19>> On vent, sedated and not agitated. Bradycardic this am with HR in 50s. Started having bowel movements.   LINES/TUBES: ETT 03/25/15 Foley 03/25/15  CXR 1/18: Stable focal patchy consolidation of the right mid lung. Stable mild cardiomegaly without pulmonary edema.   DISCUSSION: Mrs. 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 She was recently started on levaquin for a presumed pneumonia, which may have lowered her seizure threshold. She was unable to protect her airway on arrival to the ED and was intubated. She has been covered broadly with antibiotics for possible pneumonia and will be continued on Keppra.    ASSESSMENT / PLAN:  PULMONARY A: Acute hypercapneic respiratory failure 2/2 seizure with inability to protect airway Presumed aspiration event Recent pneumonia dx Smoking R lung NSCLC, likely inactive in chest at this time.   P:  Continue mechanical ventilation  Wean as tolerated DuoNebs  Continue antibiotics   CARDIOVASCULAR A:  No acute issues  P:  monitor  RENAL A:  Acute kidney injury (resolved)  P:   Avoid nephrotoxins Pharmacy to assist with dosing  GASTROINTESTINAL A:  Constipation   P:  Stress ulcer ppx Dulcolax suppository prn    Miralax prn   HEMATOLOGIC A:  DVT ppx Normocytic anemia - likely in the setting of acute illness  P:  Continue Enoxaparin Monitor CBC  INFECTIOUS A:  Concern for post-obstructive pneumonia Probable aspiration  P:  Continue vanc/zosyn for now F/u cultures   ENDOCRINE A:  Hyperglycemia (resolved) - likely related to seizures Hyperkalemia (resolved) P:  Check A1c Accuchecks / SSI  Monitor BMP   NEUROLOGIC A:  Possible new-onset  seizures with altered mental status, ? Decrease seizure threshold in setting recent infection. Also note that her dexamethasone was stopped on 03/22/15. EEG (1/16) did not show seizure activity.  Hx brain metastases (temporal lobe, cerebellum) s/p radiation therapy Concern for cervical injury during fall  P:  Continue sedation with Propofol gtt, Fentanyl gtt, Versed prn, Haldol  Keppra  Dexamethasone  D/c C-collar. CT of c-spine on 03/25/15 did not show any acute traumatic cervical spine pathology. No c-spine tenderness on exam today.   Family updates: 03/26/15 - patient's brother was updated about her status over the phone. 03/27/15 - patient's brother was updated about her status over the phone. The brother was agreeable to palliative care consult. Also updated patient's daughter at bedside about her current status and daughter also agreeable to palliative care consult.  03/28/15: Palliative care contacted the family and decision has been made to have a family meeting on 03/30/15. Daughter at bedside has been updated about patient's current medical status.   Shela Leff, MD PGY 1 ([facility]) Pager [phone]   TODAY'S SUMMARY:

## 2015-03-29 NOTE — Procedures (Signed)
Extubation Procedure Note  Patient Details:   Name: ANALYAH MCCONNON DOB: [birth year] MRN: [id number]   Airway Documentation:  + air leak cuff test prior to extubation.    Evaluation  O2 sats: stable throughout Complications: No apparent complications Patient did tolerate procedure well. Bilateral Breath Sounds: Clear Suctioning: Airway Yes, pt able to speak.  No stridor noted.  No distress notes.  Sat 99% on 2 lpm [place]  Lenna Sciara 03/29/2015, 12:41 PM

## 2015-03-29 NOTE — Progress Notes (Signed)
[facility] ADULT ICU REPLACEMENT PROTOCOL FOR AM LAB REPLACEMENT ONLY  The patient does apply for the [facility] Adult ICU Electrolyte Replacment Protocol based on the criteria listed below:   1. Is GFR >/= 40 ml/min? Yes.    Patient's GFR today is >60 2. Is urine output >/= 0.5 ml/kg/hr for the last 6 hours? Yes.   Patient's UOP is 0.96 ml/kg/hr 3. Is BUN < 60 mg/dL? Yes.    Patient's BUN today is 18 4. Abnormal electrolyte(s): Potassium 3.7 5. Ordered repletion with: Potassium per protocol 6. If a panic level lab has been reported, has the CCM MD in charge been notified? No..   Physician:    Adam Phenix 03/29/2015 3:49 AM

## 2015-03-29 NOTE — Progress Notes (Signed)
Pharmacy Antibiotic Follow-up Note  Destiny [name] is a 45 y.o. year-old female admitted on 03/25/2015.  The patient is currently on day 5 of zosyn for aspiration pneumonia.  Assessment/Plan: This patient's current antibiotics will be continued without adjustments.  Pharmacy will sign off, please contact if further questions.  Temp (24hrs), Avg:98.5 F (36.9 C), Min:98.1 F (36.7 C), Max:100.2 F (37.9 C)   Recent Labs Lab 03/25/15 2223 03/26/15 0247 03/27/15 0244 03/28/15 0413 03/29/15 0228  WBC 14.7* 13.4* 12.6* 13.5* 10.7*    Recent Labs Lab 03/26/15 0247 03/27/15 0244 03/28/15 0413 03/28/15 1620 03/29/15 0228  CREATININE 1.13* 0.91 0.76 0.70 0.64   Estimated Creatinine Clearance: 110.8 mL/min (by C-G formula based on Cr of 0.64).    No Known Allergies  Antimicrobials this admission: Vancomycin 1/15>>1/18  Zosyn 1/15>>   Microbiology results: 1/15 BCx - ngtd  1/15 UCx - ngtd 1/18 MRSA PCR - negative  Thank you for allowing pharmacy to be a part of this patient's care.  Dimitri Ped, PharmD. PGY-1 Pharmacy Resident Pager: [phone] 03/29/2015 11:35 AM

## 2015-03-29 NOTE — Progress Notes (Signed)
   Off fent gtt x 30-42mn and demanding extubation  Plan Extubate  Dr. MBrand Males M.D., FDegraff Memorial HospitalC.P Pulmonary and Critical Care Medicine Staff Physician CCastrovillePulmonary and Critical Care Pager: [phone] If no answer or between  15:00h - 7:00h: call [phone]  03/29/2015 12:14 PM

## 2015-03-30 DIAGNOSIS — R531 Weakness: Secondary | ICD-10-CM

## 2015-03-30 LAB — CBC
HCT: 36.7 % (ref 36.0–46.0)
Hemoglobin: 11.8 g/dL — ABNORMAL LOW (ref 12.0–15.0)
MCH: 28.7 pg (ref 26.0–34.0)
MCHC: 32.2 g/dL (ref 30.0–36.0)
MCV: 89.3 fL (ref 78.0–100.0)
PLATELETS: 289 10*3/uL (ref 150–400)
RBC: 4.11 MIL/uL (ref 3.87–5.11)
RDW: 16.7 % — ABNORMAL HIGH (ref 11.5–15.5)
WBC: 14 10*3/uL — AB (ref 4.0–10.5)

## 2015-03-30 LAB — GLUCOSE, CAPILLARY
GLUCOSE-CAPILLARY: 115 mg/dL — AB (ref 65–99)
GLUCOSE-CAPILLARY: 124 mg/dL — AB (ref 65–99)
Glucose-Capillary: 115 mg/dL — ABNORMAL HIGH (ref 65–99)
Glucose-Capillary: 113 mg/dL — ABNORMAL HIGH (ref 65–99)
Glucose-Capillary: 127 mg/dL — ABNORMAL HIGH (ref 65–99)
Glucose-Capillary: 151 mg/dL — ABNORMAL HIGH (ref 65–99)

## 2015-03-30 LAB — MAGNESIUM: MAGNESIUM: 1.8 mg/dL (ref 1.7–2.4)

## 2015-03-30 LAB — BASIC METABOLIC PANEL
ANION GAP: 10 (ref 5–15)
BUN: 18 mg/dL (ref 6–20)
CO2: 29 mmol/L (ref 22–32)
Calcium: 9.2 mg/dL (ref 8.9–10.3)
Chloride: 100 mmol/L — ABNORMAL LOW (ref 101–111)
Creatinine, Ser: 0.72 mg/dL (ref 0.44–1.00)
Glucose, Bld: 132 mg/dL — ABNORMAL HIGH (ref 65–99)
POTASSIUM: 3.6 mmol/L (ref 3.5–5.1)
SODIUM: 139 mmol/L (ref 135–145)

## 2015-03-30 LAB — CULTURE, BLOOD (ROUTINE X 2): CULTURE: NO GROWTH

## 2015-03-30 LAB — PHOSPHORUS: PHOSPHORUS: 3.4 mg/dL (ref 2.5–4.6)

## 2015-03-30 MED ORDER — MAGNESIUM SULFATE 2 GM/50ML IV SOLN
2.0000 g | Freq: Once | INTRAVENOUS | Status: AC
  Administered 2015-03-30: 2 g via INTRAVENOUS
  Filled 2015-03-30: qty 50

## 2015-03-30 MED ORDER — CLONAZEPAM 0.5 MG PO TABS: 0.5000 mg | ORAL_TABLET | Freq: Two times a day (BID) | ORAL | Status: DC | PRN

## 2015-03-30 MED ORDER — FAMOTIDINE 20 MG PO TABS
40.0000 mg | ORAL_TABLET | Freq: Two times a day (BID) | ORAL | Status: DC
  Administered 2015-03-30 – 2015-04-01 (×5): 40 mg via ORAL
  Filled 2015-03-30 (×2): qty 1
  Filled 2015-03-30 (×5): qty 2

## 2015-03-30 NOTE — Progress Notes (Signed)
Daily Progress Note   Patient Name: Destiny [name]       Date: 03/30/2015 DOB: 1970-07-27  Age: 45 y.o. MRN#: [id number] Attending Physician: Brand Males, MD Primary Care Physician: [place] Date: 03/25/2015  Reason for Consultation/Follow-up: Establishing goals of care and Psychosocial/spiritual support  Subjective:  -continued conversation with patient and her family gathered at bedside to discuss diagnosis,prognosis, treatment options, GOC and anticipatory care needs.   -discussed concepts of advanced directives specific to code status, and   -importance of documentation of HPOA  ([name] clearly expresses her wish for her son/Trevarias to be named)-family all support this including husband Bernita Raisin  -PMT will continue to support holistically    Length of Stay: 4 days  Current Medications: Scheduled Meds:  . antiseptic oral rinse  7 mL Mouth Rinse BID  . dexamethasone  4 mg Intravenous 4 times per day  . enoxaparin (LOVENOX) injection  50 mg Subcutaneous Q24H  . famotidine  40 mg Oral BID  . insulin aspart  2-6 Units Subcutaneous 6 times per day  . ipratropium-albuterol  3 mL Nebulization Q6H  . levETIRAcetam  500 mg Intravenous Q12H  . magnesium sulfate 1 - 4 g bolus IVPB  2 g Intravenous Once  . multivitamin with minerals  1 tablet Per Tube Daily    Continuous Infusions:    PRN Meds: sodium chloride, acetaminophen, acetaminophen, bisacodyl, clonazePAM, metoprolol, polyethylene glycol  Physical Exam: Physical Exam  Constitutional: She appears well-developed. She appears lethargic. She appears ill.  HENT:  Mouth/Throat: Oropharynx is clear and moist and mucous membranes are normal.  Cardiovascular: Tachycardia present.     Pulmonary/Chest: She has decreased breath sounds in the right lower field and the left lower field.  Abdominal: Soft. Normal appearance. There is no tenderness.  Neurological: She appears lethargic.  Skin: Skin is warm and dry.  Psychiatric: Her speech is delayed. She is slowed.                Vital Signs: BP 158/89 mmHg  Pulse 100  Temp(Src) 99.5 F (37.5 C) (Core (Comment))  Resp 24  Ht '5\' 8"'$  (1.727 m)  Wt 101.197 kg (223 lb 1.6 oz)  BMI 33.93 kg/m2  SpO2 97%  LMP 03/16/2015 SpO2: SpO2: 97 % O2 Device: O2 Device:  Not Delivered O2 Flow Rate: O2 Flow Rate (L/min): 2 L/min (weaned to RA)  Intake/output summary:  Intake/Output Summary (Last 24 hours) at 03/30/15 1240 Last data filed at 03/30/15 0603  Gross per 24 hour  Intake  296.6 ml  Output   3875 ml  Net -3578.4 ml   LBM: Last BM Date: 03/29/15 Baseline Weight: Weight: 109.4 kg (241 lb 2.9 oz) Most recent weight: Weight: 101.197 kg (223 lb 1.6 oz)       Palliative Assessment/Data: Flowsheet Rows        Most Recent Value   Intake Tab    Referral Department  Critical care   Unit at Time of Referral  ICU   Palliative Care Primary Diagnosis  Cancer   Date Notified  03/27/15   Palliative Care Type  New Palliative care   Reason for referral  Clarify Goals of Care   Date of Admission  03/25/15   # of days IP prior to Palliative referral  2   Clinical Assessment    Psychosocial & Spiritual Assessment    Palliative Care Outcomes       Additional Data Reviewed: CBC    Component Value Date/Time   WBC 14.0* 03/30/2015 0311   WBC 8.2 01/03/2015 1012   RBC 4.11 03/30/2015 0311   RBC 4.22 01/03/2015 1012   HGB 11.8* 03/30/2015 0311   HGB 12.5 01/03/2015 1012   HCT 36.7 03/30/2015 0311   HCT 36.8 01/03/2015 1012   PLT 289 03/30/2015 0311   PLT 258 01/03/2015 1012   MCV 89.3 03/30/2015 0311   MCV 87.2 01/03/2015 1012   MCH 28.7 03/30/2015 0311   MCH 29.6 01/03/2015 1012   MCHC 32.2 03/30/2015 0311   MCHC 34.0  01/03/2015 1012   RDW 16.7* 03/30/2015 0311   RDW 13.4 01/03/2015 1012   LYMPHSABS 4.0 03/25/2015 2223   LYMPHSABS 1.0 01/03/2015 1012   MONOABS 1.3* 03/25/2015 2223   MONOABS 0.7 01/03/2015 1012   EOSABS 0.0 03/25/2015 2223   EOSABS 0.0 01/03/2015 1012   BASOSABS 0.0 03/25/2015 2223   BASOSABS 0.0 01/03/2015 1012    CMP     Component Value Date/Time   NA 139 03/30/2015 0311   NA 140 01/03/2015 1012   K 3.6 03/30/2015 0311   K 3.7 01/03/2015 1012   CL 100* 03/30/2015 0311   CO2 29 03/30/2015 0311   CO2 21* 01/03/2015 1012   GLUCOSE 132* 03/30/2015 0311   GLUCOSE 101 01/03/2015 1012   BUN 18 03/30/2015 0311   BUN 8.5 01/03/2015 1012   CREATININE 0.72 03/30/2015 0311   CREATININE 0.7 01/03/2015 1012   CALCIUM 9.2 03/30/2015 0311   CALCIUM 9.4 01/03/2015 1012   PROT 5.9* 03/26/2015 0247   PROT 7.2 01/03/2015 1012   ALBUMIN 3.0* 03/26/2015 0247   ALBUMIN 3.5 01/03/2015 1012   AST 72* 03/26/2015 0247   AST 15 01/03/2015 1012   ALT 55* 03/26/2015 0247   ALT 17 01/03/2015 1012   ALKPHOS 72 03/26/2015 0247   ALKPHOS 84 01/03/2015 1012   BILITOT 0.4 03/26/2015 0247   BILITOT <0.30 01/03/2015 1012   GFRNONAA >60 03/30/2015 0311   GFRAA >60 03/30/2015 0311       Problem List:  Patient Active Problem List   Diagnosis Date Noted  . DNR (do not resuscitate) discussion 03/28/2015  . Brain metastasis ([place])   . Acute respiratory failure ([place]) 03/27/2015  . Acute encephalopathy 03/27/2015  . Altered mental status 03/26/2015  . Palliative care  encounter   . Needs smoking cessation education   . HCAP (healthcare-associated pneumonia)   . Odynophagia   . Acute esophagitis   . Dehydration   . CAP (community acquired pneumonia) 09/08/2014  . Sinus tachycardia ([place]) 09/08/2014  . Cough   . Shingles 09/06/2014  . Encounter for antineoplastic chemotherapy 08/28/2014  . Esophagitis 08/21/2014  . Extremity edema 08/08/2014  . Chronic pain 07/10/2014  . Brain metastases  ([place]) 06/18/2014  . Lung nodules   . Primary cancer of right upper lobe of lung ([place])   . Poor dentition   . Hyperglycemia 06/11/2014  . Tobacco abuse 06/11/2014  . GERD (gastroesophageal reflux disease) 06/10/2014  . Hypertension 06/10/2014  . Depression   . Paroxysmal supraventricular tachycardia ([place]) 01/16/2014  . SVT (supraventricular tachycardia) ([place]) 01/16/2014     Palliative Care Assessment & Plan    1.Code Status:  Full code    Code Status Orders        Start     Ordered   03/26/15 0101  Full code   Continuous     03/26/15 0103    Code Status History    Date Active Date Inactive Code Status Order ID Comments User Context   09/08/2014  9:49 PM 09/11/2014  3:53 PM Full Code [id number]  Theressa Millard, MD Inpatient   06/14/2014 10:48 AM 06/15/2014  9:33 PM Full Code [id number]  Aletta Edouard, MD Inpatient   06/10/2014 11:26 PM 06/14/2014 10:48 AM Full Code [id number]  Lavina Hamman, MD ED   01/16/2014  3:50 PM 01/16/2014  9:56 PM Full Code [id number]  Evans Lance, MD Inpatient        Goals of Care/Additional Recommendations:  Treat the treatable, hopeful for improvement   Desire for further Chaplaincy support:yes -to secure HPOA-patietn is verbalizing her desire for son/Travaris  Psycho-social Needs: information on hospice benefit   Symptom Management:      1. Weakness: PT evaluation and treatmetn  4. Palliative Prophylaxis:   Aspiration, Bowel Regimen, Delirium Protocol, Frequent Pain Assessment and Oral Care  5. Prognosis: Unable to determine  6. Discharge Planning:  Pending-likely will need SNF for rehabilitation    Care plan was discussed with Dr Chase Caller  Thank you for allowing the Palliative Medicine Team to assist in the care of this patient.   Time In: 1200 Time Out: 1240 Total Time 40 min Prolonged Time Billed  no         Knox Royalty, NP  03/30/2015, 12:40 PM  Please contact Palliative Medicine Team phone at [phone] for questions and  concerns.

## 2015-03-30 NOTE — Progress Notes (Signed)
NURSING PROGRESS NOTE  [name] [id number] Transfer Data: 03/30/2015 6:22 PM Attending Provider: Brand Males, MD [facility] Code Status: FULL   Destiny [name] is a 45 y.o. female patient transferred from 32M  -No acute distress noted.  -No complaints of shortness of breath.  -No complaints of chest pain.   Cardiac Monitoring: Box # 14 in place. Cardiac monitor yields:sinus tachycardia.  Last Documented Vital Signs: Blood pressure 147/106, pulse 103, temperature 98.9 F (37.2 C), temperature source Oral, resp. rate 20, height '5\' 8"'$  (1.727 m), weight 101.197 kg (223 lb 1.6 oz), last menstrual period 03/16/2015, SpO2 96 %.  IV Fluids:  IV in place, occlusive dsg intact without redness, IV cath wrist left, condition patent and no redness and antecubital left, condition patent and no redness none.   Allergies:  Review of patient's allergies indicates no known allergies.  Past Medical History:   has a past medical history of Thyroid nodule; Depression; Suicide attempt [facility]); SVT (supraventricular tachycardia) ([place]); Metastatic lung cancer (metastasis from lung to other site) [facility]) (06/2014); Brain metastases ([place]); and Hypertension.  Past Surgical History:   has past surgical history that includes Thyroid surgery; supraventricular tachycardia ablation (N/A, 01/16/2014); and TEE without cardioversion (N/A, 06/12/2014).  Social History:   reports that she has been smoking Cigarettes.  She has a 30 pack-year smoking history. She uses smokeless tobacco. She reports that she does not drink alcohol or use illicit drugs.  Skin: intact  Patient/Family orientated to room. Information packet given to patient/family. Admission inpatient armband information verified with patient/family to include name and date of birth and placed on patient arm. Side rails up x 2, fall assessment and education completed with patient/family. Patient/family able to verbalize  understanding of risk associated with falls and verbalized understanding to call for assistance before getting out of bed. Call light within reach. Patient/family able to voice and demonstrate understanding of unit orientation instructions.

## 2015-03-30 NOTE — Evaluation (Signed)
Clinical/Bedside Swallow Evaluation Patient Details  Name: Destiny [name] MRN: [id number] Date of Birth: October 06, 1970  Today's Date: 03/30/2015 Time: SLP Start Time (ACUTE ONLY): 1430 SLP Stop Time (ACUTE ONLY): 1445 SLP Time Calculation (min) (ACUTE ONLY): 15 min  Past Medical History:  Past Medical History  Diagnosis Date  . Thyroid nodule   . Depression   . Suicide attempt ([place])   . SVT (supraventricular tachycardia) ([place])     a. Long RP tachycardia;  b. 01/2014 s/p RFCA.  . Metastatic lung cancer (metastasis from lung to other site) ([place]) 06/2014    Stage IV (T3, N2, M1b) non-small cell lung cancer, adenocarcinoma; treated with chemo radiation (radiation completed 09/04/14).  Brain met treated with radiation 06/2014.   . Brain metastases ([place])     left brain met treated with radiation 06/2014. MRI 7/27 with new right brain met and shrunken left met.   . Hypertension    Past Surgical History:  Past Surgical History  Procedure Laterality Date  . Thyroid surgery      Removed thyroid nodule, states still has thyroid; 2010  . Supraventricular tachycardia ablation N/A 01/16/2014    Procedure: SUPRAVENTRICULAR TACHYCARDIA ABLATION;  Surgeon: Evans Lance, MD;  Location: [facility] CATH LAB;  Service: Cardiovascular;  Laterality: N/A;  . Tee without cardioversion N/A 06/12/2014    Procedure: TRANSESOPHAGEAL ECHOCARDIOGRAM (TEE);  Surgeon: Sueanne Margarita, MD;  Location[facility] ENDOSCOPY;  Service: Cardiovascular;  Laterality: N/A;   HPI:  45 y.o. 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 Intubated 1/15-19.  Dx acute hypercapneic respiratory failure 2/2 seizure with inability to protect airway, tachycardia, possible new onset seizures with AMS.  Pt is being followed by Palliative Medicine; [place] meeting held 1/20.     Assessment / Plan / Recommendation Clinical Impression  Pt presents with a mild dysphagia s/p four-day  intubation and exacerbated by decreased MS.  Slowed processing and delayed response time noted during exam.  Able to functionally masticate ice chips, purees; swallow response consistent, but followed by overt coughing with thin liquids, suggesting decreased airway protection.  For today, recommend allowing purees (applesauce, yogurt, pudding) and ice chips.  SLP will f/u next date for improvements and readiness to advance diet, with recommendations made in context of family's decision after [place] meeting.  Pt/family agree with plan.     Aspiration Risk  Mild aspiration risk    Diet Recommendation     Medication Administration: Crushed with puree    Other  Recommendations Oral Care Recommendations: Oral care QID   Follow up Recommendations       Frequency and Duration min 2x/week  2 weeks       Prognosis Prognosis for Safe Diet Advancement: Good      Swallow Study   General Date of Onset: 03/25/15 HPI: 45 y.o. 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 Intubated 1/15-19.  Dx acute hypercapneic respiratory failure 2/2 seizure with inability to protect airway, tachycardia, possible new onset seizures with AMS.  Pt is being followed by Palliative Medicine; [place] meeting held 1/20.   Type of Study: Bedside Swallow Evaluation Previous Swallow Assessment: no Diet Prior to this Study: NPO Temperature Spikes Noted: No Respiratory Status: Room air History of Recent Intubation: Yes Length of Intubations (days): 4 days Date extubated: 03/29/15 Behavior/Cognition: Alert;Confused Oral Cavity Assessment: Within Functional Limits  Oral Care Completed by SLP: No Oral Cavity - Dentition: Missing dentition Vision: Functional for self-feeding Self-Feeding Abilities: Needs assist Patient Positioning: Upright in bed Baseline Vocal Quality: Normal Volitional Cough: Strong Volitional Swallow: Able to elicit     Oral/Motor/Sensory Function Overall Oral Motor/Sensory Function: Within functional limits (tremor of tongue)   Ice Chips Ice chips: Within functional limits Presentation: Spoon   Thin Liquid Thin Liquid: Impaired Presentation: Spoon Pharyngeal  Phase Impairments: Multiple swallows;Wet Vocal Quality;Cough - Delayed    Nectar Thick Nectar Thick Liquid: Not tested   Honey Thick Honey Thick Liquid: Not tested   Puree Puree: Within functional limits Presentation: [place]. [place], [place] CCC/SLP Pager [phone]    Solid: Not tested        Destiny [name] 03/30/2015,2:54 PM

## 2015-03-30 NOTE — Progress Notes (Signed)
Report called pt to be transferred as soon as possible.

## 2015-03-30 NOTE — Progress Notes (Signed)
PULMONARY / CRITICAL CARE MEDICINE   Name: Destiny [name] MRN: [id number] DOB: [birth year]    VITAL SIGNS: Temp:  [98.6 F (37 C)-100.8 F (38.2 C)] 100 F (37.8 C) (01/20 0700) Pulse Rate:  [82-161] 103 (01/20 0700) Resp:  [10-26] 26 (01/20 0700) BP: (148-191)/(77-111) 155/84 mmHg (01/20 0700) SpO2:  [94 %-100 %] 96 % (01/20 0700) FiO2 (%):  [40 %] 40 % (01/19 1200) Weight:  [101.197 kg (223 lb 1.6 oz)] 101.197 kg (223 lb 1.6 oz) (01/20 0500) HEMODYNAMICS:   VENTILATOR SETTINGS: Vent Mode:  [-] PSV FiO2 (%):  [40 %] 40 % PEEP:  [8 cmH20] 8 cmH20 Pressure Support:  [5 cmH20] 5 cmH20 INTAKE / OUTPUT: Intake/Output      01/19 0701 - 01/20 0700 01/20 0701 - 01/21 0700   I.V. (mL/kg) 286.5 (2.8)    NG/GT 179    IV Piggyback 360    Total Intake(mL/kg) 825.5 (8.2)    Urine (mL/kg/hr) 4325 (1.8)    Total Output 4325     Net -3499.5            PHYSICAL EXAMINATION:  PHYSICAL EXAMINATION: General: sitting up in bed   Neuro: AAOx2 (person and place only). Patient believes the year is "1970."  HEENT:EOMI     Cardiovascular:RRR, s1/s2, no m/r/g Lungs: Anterior lung fields clear to auscultation bilaterally.  Abdomen: Obese, + bowel sounds, NT, ND  Musculoskeletal: No gross deformities. Trace bipedal edema.   Skin: Warm, dry, intact. No rashes.    LABS:  CBC  Recent Labs Lab 03/28/15 0413 03/29/15 0228 03/30/15 0311  WBC 13.5* 10.7* 14.0*  HGB 10.0* 9.5* 11.8*  HCT 31.1* 29.8* 36.7  PLT 220 248 289   Coag's  Recent Labs Lab 03/25/15 2223  INR 1.22   BMET  Recent Labs Lab 03/28/15 1620 03/29/15 0228 03/30/15 0311  NA 143 145 139  K 4.7 3.7 3.6  CL 105 103 100*  CO2 27 32 29  BUN '16 18 18  '$ CREATININE 0.70 0.64 0.72  GLUCOSE 138* 129* 132*   Electrolytes  Recent Labs Lab 03/28/15 1620 03/28/15 2016 03/29/15 0228 03/29/15 1138 03/30/15 0311  CALCIUM 8.7*  --  8.6*  --  9.2  MG  --  2.1  --  1.9 1.8  PHOS  --  2.9  --  2.2* 3.4    Sepsis Markers  Recent Labs Lab 03/25/15 2228 03/25/15 2344 03/26/15 0255 03/27/15 0244  LATICACIDVEN >17.00* 7.09* 2.5*  --   PROCALCITON  --   --   --  5.22   ABG  Recent Labs Lab 03/28/15 0344 03/28/15 0625 03/29/15 0345  PHART 7.295* 7.370 7.405  PCO2ART 60.1* 48.7* 53.7*  PO2ART 161* 169* 126*   Liver Enzymes  Recent Labs Lab 03/24/15 1043 03/25/15 2223 03/26/15 0247  AST 22 59* 72*  ALT 31 46 55*  ALKPHOS 73 101 72  BILITOT 0.5 0.4 0.4  ALBUMIN 3.5 3.4* 3.0*   Cardiac Enzymes No results for input(s): TROPONINI, PROBNP in the last 168 hours. Glucose  Recent Labs Lab 03/29/15 1105 03/29/15 1541 03/29/15 1611 03/29/15 1958 03/29/15 2324 03/30/15 0315  GLUCAP 116* 127* 102* 128* 115* 115*    Imaging Dg Chest Port 1 View  03/29/2015  CLINICAL DATA:  Intubation. EXAM: PORTABLE CHEST 1 VIEW COMPARISON:  03/28/2015. FINDINGS: Endotracheal tube and NG tube in stable position. Heart size is stable. Diffuse mild right lung infiltrate. Persistent atelectasis right mid lung and left lung base. Small right  pleural effusion cannot be excluded. No pneumothorax. IMPRESSION: 1. Lines and tubes stable position . 2. Diffuse mild right lung infiltrate noted on today's exam. 3. Persistent right mid lung and left base subsegmental atelectasis. Continued follow-up exam suggested to demonstrate clearing. Electronically Signed   By: Marcello Moores  Register   On: 03/29/2015 07:15   STUDIES:  As above  CULTURES: Blood cx 03/25/15 >> no growth in 4 days  Urine cx 03/25/15 >> no growth   ANTIBIOTICS: Vanc 03/25/15 >>03/28/15 Zosyn 03/25/15 >>  SIGNIFICANT EVENTS: 1/15>> Admitted and intubated.  1/16>> on vent, sedated. Low grade fevers and tachycardic  1/17>>on vent, sedated. Vitals stable but agitated despite being on propofol gtt and fentanyl gtt 1/18>>on vent, sedated. Agitated when disturbed. On propofol gtt, fentanyl gtt, and versed gtt. Continues to be constipated.   1/19>> On vent, sedated and not agitated. Bradycardic this am with HR in 50s. Started having bowel movements. Extubated in the afternoon. 1/20>> Not agitated overnight, did not require meds for sedation. Slow to answer questions and follow commands, appears anxious. AAO x 2 (person and place only). Systolic BP 174-081K, HR in low 100s, satting in the upper 90s on RA. Has a history of SVT and HR 160s-180s yesterday, was started on metoprolol.   LINES/TUBES: ETT 03/25/15>>03/29/15 Foley 03/25/15  ASSESSMENT / PLAN:  PULMONARY A: Acute hypercapneic respiratory failure 2/2 seizure with inability to protect airway Presumed aspiration event Recent pneumonia dx Smoking R lung NSCLC, likely inactive in chest at this time.   P:  Extubated yesterday, satting in the upper 90s on room air. Continue to monitor. Give supplemental O2 if needed to maintain O2 saturation >92% DuoNebs  Consider discontinuing Zosyn   CARDIOVASCULAR A:  Tachycardia likely related to anxiety and history of SVTs Hx of SVT P:  Metoprolol   RENAL A:  Acute kidney injury (resolved)  P:   Avoid nephrotoxins Pharmacy to assist with dosing  GASTROINTESTINAL A:  Constipation (resolved)  P:  Stress ulcer ppx Dulcolax suppository prn    Miralax prn   HEMATOLOGIC A:  DVT ppx Normocytic anemia - likely in the setting of acute illness  P:  Continue Enoxaparin Monitor CBC  INFECTIOUS A:  Concern for post-obstructive pneumonia Probable aspiration  P:  Continue zosyn for now F/u cultures   ENDOCRINE A:  Hyperglycemia (resolved) - likely related to seizures Hyperkalemia (resolved) P:  Check A1c Accuchecks / SSI  Monitor BMP   NEUROLOGIC A:  Possible new-onset seizures with altered mental status, ? Decrease seizure threshold in setting recent infection. Also note that her dexamethasone was stopped on 03/22/15. EEG (1/16) did not show seizure activity.  Hx brain metastases  (temporal lobe, cerebellum) s/p radiation therapy  P:  Keppra  Dexamethasone  Family updates: 03/26/15 - patient's brother was updated about her status over the phone. 03/27/15 - patient's brother was updated about her status over the phone. The brother was agreeable to palliative care consult. Also updated patient's daughter at bedside about her current status and daughter also agreeable to palliative care consult.  03/28/15: Palliative care contacted the family and decision has been made to have a family meeting on 03/30/15. Daughter at bedside has been updated about patient's current medical status.   Shela Leff, MD PGY 1 ([facility]) Pager [phone]   TODAY'S SUMMARY:

## 2015-03-30 NOTE — Progress Notes (Signed)
Attempted report to [id number]

## 2015-03-30 NOTE — Progress Notes (Signed)
   03/30/15 1500  Clinical Encounter Type  Visited With Patient;Patient and family together;Health care provider  Visit Type Psychological support;Spiritual support;Social support  Referral From Care management  Consult/Referral To Chaplain;Palliative care  Spiritual Encounters  Spiritual Needs Prayer;Emotional  Stress Factors  Patient Stress Factors Exhausted;Health changes;Loss of control  Family Stress Factors Family relationships;Health changes  Advance Directives (For Healthcare)  Does patient have an advance directive? No  Would patient like information on creating an advanced directive? Yes - Educational materials given    Chaplain stopped by to help complete Advanced Directive however, when Chaplain sat down with Ms. Clasby she became overwhelmed. It is my sense that Ms. Dilday has never thought about making plans in the event that she could not make decisions for herself. Ms. Fullington has a clear understanding of who she wants to make decisions for her but is not ready to make decisions surrounding her wishes relating to life support. Chaplain left Advanced Directive paperwork with Pt. and asked Pt. and think about what she wanted. Chaplain will check back in with Pt. tomorrow.

## 2015-03-31 LAB — CULTURE, BLOOD (ROUTINE X 2): Culture: NO GROWTH

## 2015-03-31 LAB — GLUCOSE, CAPILLARY
GLUCOSE-CAPILLARY: 107 mg/dL — AB (ref 65–99)
GLUCOSE-CAPILLARY: 150 mg/dL — AB (ref 65–99)
Glucose-Capillary: 112 mg/dL — ABNORMAL HIGH (ref 65–99)
Glucose-Capillary: 132 mg/dL — ABNORMAL HIGH (ref 65–99)
Glucose-Capillary: 146 mg/dL — ABNORMAL HIGH (ref 65–99)
Glucose-Capillary: 95 mg/dL (ref 65–99)

## 2015-03-31 LAB — BASIC METABOLIC PANEL
Anion gap: 13 (ref 5–15)
BUN: 20 mg/dL (ref 6–20)
CALCIUM: 9.4 mg/dL (ref 8.9–10.3)
CO2: 26 mmol/L (ref 22–32)
CREATININE: 0.67 mg/dL (ref 0.44–1.00)
Chloride: 100 mmol/L — ABNORMAL LOW (ref 101–111)
GFR calc non Af Amer: >60 mL/min (ref >60)
Glucose, Bld: 109 mg/dL — ABNORMAL HIGH (ref 65–99)
Potassium: 4.2 mmol/L (ref 3.5–5.1)
Sodium: 139 mmol/L (ref 135–145)

## 2015-03-31 LAB — CBC
HEMATOCRIT: 40.3 % (ref 36.0–46.0)
Hemoglobin: 13.6 g/dL (ref 12.0–15.0)
MCH: 29.4 pg (ref 26.0–34.0)
MCHC: 33.7 g/dL (ref 30.0–36.0)
MCV: 87.2 fL (ref 78.0–100.0)
PLATELETS: 295 10*3/uL (ref 150–400)
RBC: 4.62 MIL/uL (ref 3.87–5.11)
RDW: 16.7 % — AB (ref 11.5–15.5)
WBC: 14.6 10*3/uL — ABNORMAL HIGH (ref 4.0–10.5)

## 2015-03-31 MED ORDER — METOPROLOL TARTRATE 25 MG PO TABS: 25.0000 mg | ORAL_TABLET | Freq: Two times a day (BID) | ORAL | Status: DC

## 2015-03-31 MED ORDER — METOPROLOL TARTRATE 25 MG PO TABS
25.0000 mg | ORAL_TABLET | Freq: Two times a day (BID) | ORAL | Status: DC
  Administered 2015-03-31 – 2015-04-01 (×2): 25 mg via ORAL
  Filled 2015-03-31 (×2): qty 1

## 2015-03-31 MED ORDER — IPRATROPIUM-ALBUTEROL 0.5-2.5 (3) MG/3ML IN SOLN
3.0000 mL | Freq: Two times a day (BID) | RESPIRATORY_TRACT | Status: DC
  Administered 2015-04-01: 3 mL via RESPIRATORY_TRACT
  Filled 2015-03-31: qty 3

## 2015-03-31 NOTE — Progress Notes (Signed)
Speech Language Pathology Treatment: Dysphagia  Patient Details Name: Destiny [name] MRN: [id number] DOB: 11/17/1970 Today's Date: 03/31/2015 Time: 9191-6606 SLP Time Calculation (min) (ACUTE ONLY): 21 min  Assessment / Plan / Recommendation Clinical Impression  Pt continues to have immediate coughing with thin liquids, concerning for reduced airway protection. Pt is aware of coughing, but does not relate it to swallowing function. Multiple swallows and delayed coughing with Dys 2 textures is likely indicative of residuals and/or airway penetration. She appears to tolerate purees and nectar thick liquids without overt signs of aspiration. Recommend to start Dys 1 diet and nectar thick liquids by cup. SLP to follow up for tolerance and additional advancements with increased time post-extubation.   HPI HPI: 45 y.o. 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 Intubated 1/15-19.  Dx acute hypercapneic respiratory failure 2/2 seizure with inability to protect airway, tachycardia, possible new onset seizures with AMS.  Pt is being followed by Palliative Medicine; [place] meeting held 1/20.        SLP Plan  Goals updated     Recommendations  Diet recommendations: Dysphagia 1 (puree);Nectar-thick liquid Liquids provided via: Cup;No straw Medication Administration: Crushed with puree Supervision: Patient able to self feed;Full supervision/cueing for compensatory strategies Compensations: Minimize environmental distractions;Slow rate;Small sips/bites Postural Changes and/or Swallow Maneuvers: Seated upright 90 degrees;Upright 30-60 min after meal             Oral Care Recommendations: Oral care BID Follow up Recommendations:  (tba) Plan: Goals updated     GO               [name], M.A. CCC-SLP [phone]  [name] 03/31/2015, 9:57 AM

## 2015-03-31 NOTE — Progress Notes (Signed)
[facility]                                                                              Patient Demographics  Destiny [name], is a 45 y.o. female, DOB - 1970/10/31, DBZ:[id number]  Admit date - 03/25/2015   Admitting Physician Marshell Garfinkel, MD  Outpatient Primary MD for the patient is Robertson  LOS - 5   Chief Complaint  Patient presents with  . 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 She was seen by radiation oncology on 03/22/15 and dexamethasone was discontinued with plans for repeat MRI in one month. On 1/14, she presented to the ED with complaints of right-sided chest pain and non-productive cough. A CTA PE protocol was obtained and negative for PE but did show some right-sided infiltrates. She was sent home with a course of levaquin.  Next evening on 03/25/15, patient was found to be Unresponsive on the floor. EMS was called and found her to be seizing with "left-sided gaze, fixed and dilated pupils and a hematoma on her right forehead". They gave a total of 7.'5mg'$  versed and 2 of narcan with no change in her status. On arrival to the ED she was intubated for airway protection. Patient was admitted to ICU by PC CM.  The patient was transferred to the floor on 03/30/15, [facility] service/myself assumed care on 1/21  Assessment & Plan    Active Problems: Acute hypercapnic respiratory failure in the setting of stage IV right lung CA, recent pneumonia, presumed aspiration event - Currently resolved, O2 sats 100% on room air - Continue IV vancomycin and Zosyn - Blood cultures remained negative to date - Patient was intubated on admission, extubated on 1/19 -Speech evaluation, done, recommended dysphagia 1 diet    Acute encephalopathy: Possibly  due to new onset seizures, decrease his seizure thought to old in setting of recent infection, dexamethasone was stopped on 1/12 - Per daughter at the bedside, mental status close to baseline - EEG on 1/16 did not show seizure activity - 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 [name] - will continue dexamethasone at discharge - Palliative care following  Generalized deconditioning - Patient is still very deconditioned, start PT , ambulation  Tachycardia/history of SVTs -Asymptomatic, placed on scheduled metoprolol   Acute kidney injury - Likely due to #1, resolved  Normocytic anemia - Currently stable, follow H&H   Code Status:Full CODE STATUS   Family Communication: Discussed in detail with the patient, all imaging results, lab results explained to the patient and patient's daughter at the bedside   Disposition Plan: PT evaluation recommended home health PT, rolling walker   Time Spent in minutes   25 minutes  Procedures  Intubation, extubation  Consults   Patient was admitted by critical care service   DVT Prophylaxis  Lovenox  Medications  Scheduled Meds: . antiseptic oral rinse  7 mL Mouth Rinse BID  . dexamethasone  4 mg Intravenous 4 times per day  . enoxaparin (LOVENOX) injection  50 mg Subcutaneous Q24H  . famotidine  40 mg Oral BID  . insulin aspart  2-6 Units Subcutaneous 6 times per day  . ipratropium-albuterol  3 mL Nebulization Q6H  . levETIRAcetam  500 mg Intravenous Q12H  . metoprolol tartrate  25 mg Oral BID  . multivitamin with minerals  1 tablet Per Tube Daily   Continuous Infusions:  PRN Meds:.acetaminophen, acetaminophen, bisacodyl, clonazePAM, metoprolol, polyethylene glycol   Antibiotics   Anti-infectives    Start     Dose/Rate Route Frequency Ordered Stop   03/27/15 0000   vancomycin (VANCOCIN) 1,250 mg in sodium chloride 0.9 % 250 mL IVPB  Status:  Discontinued     1,250 mg 166.7 mL/hr over 90 Minutes Intravenous Every 24 hours 03/26/15 0256 03/26/15 1407   03/26/15 1700  vancomycin (VANCOCIN) IVPB 750 mg/150 ml premix  Status:  Discontinued     750 mg 150 mL/hr over 60 Minutes Intravenous Every 12 hours 03/26/15 1407 03/28/15 1209   03/26/15 0600  piperacillin-tazobactam (ZOSYN) IVPB 3.375 g  Status:  Discontinued     3.375 g 12.5 mL/hr over 240 Minutes Intravenous Every 8 hours 03/26/15 0256 03/30/15 1050   03/25/15 2300  vancomycin (VANCOCIN) 2,000 mg in sodium chloride 0.9 % 500 mL IVPB     2,000 mg 250 mL/hr over 120 Minutes Intravenous  Once 03/25/15 2259 03/26/15 0134   03/25/15 2300  piperacillin-tazobactam (ZOSYN) IVPB 3.375 g     3.375 g 100 mL/hr over 30 Minutes Intravenous  Once 03/25/15 2259 03/26/15 0005        Subjective:   [name] was seen and examined today. Mental status improving, daughter at the bedside, alert and oriented. Very weak, Patient denies dizziness, chest pain, abdominal pain, N/V/D/C, new weakness, numbess, tingling. No acute events overnight.    Objective:   Blood pressure 139/81, pulse 115, temperature 98.7 F (37.1 C), temperature source Oral, resp. rate 20, height '5\' 8"'$  (1.727 m), weight 99.1 kg (218 lb 7.6 oz), last menstrual period 03/16/2015, SpO2 100 %.  Wt Readings from Last 3 Encounters:  03/31/15 99.1 kg (218 lb 7.6 oz)  03/24/15 100.415 kg (221 lb 6 oz)  03/22/15 99.565 kg (219 lb 8 oz)     Intake/Output Summary (Last 24 hours) at 03/31/15 1206 Last data filed at 03/31/15 1015  Gross per 24 hour  Intake    375 ml  Output   1600 ml  Net  -1225 ml    Exam  General: Alert and oriented x 2, NAD  HEENT:  PERRLA, EOMI, Anicteric Sclera, mucous membranes moist.   Neck: Supple, no JVD, no masses  CVS: S1 S2 auscultated, no rubs, murmurs or gallops. Regular rate and rhythm.  Respiratory:  Clear to auscultation bilaterally, no wheezing, rales or rhonchi  Abdomen: Soft, nontender, nondistended, + bowel sounds  Ext: no cyanosis clubbing or edema  Neurono new deficits  skin: No rashes  Psych: Normal affect and demeanor, alert and oriented x2   Data Review   Micro Results Recent Results (from the past 240 hour(s))  Blood culture (routine x 2)     Status: None   Collection Time: 03/25/15 10:26 PM  Result Value Ref Range Status  Specimen Description BLOOD LEFT HAND  Final   Special Requests BOTTLES DRAWN AEROBIC ONLY [place]  Final   Culture NO GROWTH 5 DAYS  Final   Report Status 03/30/2015 FINAL  Final  Urine culture     Status: None   Collection Time: 03/25/15 11:05 PM  Result Value Ref Range Status   Specimen Description URINE, CATHETERIZED  Final   Special Requests NONE  Final   Culture NO GROWTH 1 DAY  Final   Report Status 03/27/2015 FINAL  Final  Blood culture (routine x 2)     Status: None (Preliminary result)   Collection Time: 03/25/15 11:35 PM  Result Value Ref Range Status   Specimen Description BLOOD RIGHT HAND  Final   Special Requests BOTTLES DRAWN AEROBIC AND ANAEROBIC 5CC   Final   Culture NO GROWTH 4 DAYS  Final   Report Status PENDING  Incomplete  MRSA PCR Screening     Status: None   Collection Time: 03/28/15  9:55 AM  Result Value Ref Range Status   MRSA by PCR NEGATIVE NEGATIVE Final    Comment:        The GeneXpert MRSA Assay (FDA approved for NASAL specimens only), is one component of a comprehensive MRSA colonization surveillance program. It is not intended to diagnose MRSA infection nor to guide or monitor treatment for MRSA infections.     Radiology Reports Dg Chest 2 View  03/11/2015  CLINICAL DATA:  45 year old female with right-sided chest and rib pain with cough. Recent treatment for lung cancer. EXAM: CHEST  2 VIEW COMPARISON:  01/01/2015 and prior CTs. 12/20/2014 and prior chest radiographs. FINDINGS: The  cardiomediastinal silhouette is unremarkable. Increased subsegmental right upper lobe atelectasis/ scarring noted. A 7 mm nodule inferior to this area of atelectasis/scarring is noted. There is no evidence of pulmonary edema, pneumothorax, pleural effusion or acute bony abnormality. IMPRESSION: Increased subsegmental right upper lobe atelectasis/ scarring. 7 mm nodule slightly inferior to this area may represent one of the areas previously identified on CT. Electronically Signed   By: Margarette Canada M.D.   On: 03/11/2015 11:14   Ct Head Wo Contrast  03/26/2015  CLINICAL DATA:  45 year old female with trauma EXAM: CT HEAD WITHOUT CONTRAST CT CERVICAL SPINE WITHOUT CONTRAST TECHNIQUE: Multidetector CT imaging of the head and cervical spine was performed following the standard protocol without intravenous contrast. Multiplanar CT image reconstructions of the cervical spine were also generated. COMPARISON:  Head CT dated 06/10/2014 and MRI dated 06/20/2014 FINDINGS: CT HEAD FINDINGS The ventricles and the sulci are appropriate in size for the patient's age. There is no intracranial hemorrhage. No midline shift or mass effect identified. The gray-white matter differentiation is preserved. There is mild mucoperiosteal thickening of paranasal sinuses. Multiple retention cysts or polyps noted in the right maxillary sinus measuring up to 1.5 cm. The mastoid air cells are well aerated. The calvarium is intact. Right forehead hematoma. CT CERVICAL SPINE FINDINGS There is no acute fracture or subluxation of the cervical spine.The intervertebral disc spaces are preserved.The odontoid and spinous processes are intact.There is normal anatomic alignment of the C1-C2 lateral masses. The visualized soft tissues appear unremarkable. An endotracheal and enteric tubes are partially visualized. There is complete opacification of the visualized portion of the right lung. IMPRESSION: No acute intracranial pathology. No acute/traumatic  cervical spine pathology. Complete opacification of the visualized portion of the right lung. A Electronically Signed   By: Anner Crete M.D.   On: 03/26/2015 00:32  Ct Angio Chest Pe W/cm &/or Wo Cm  03/24/2015  CLINICAL DATA:  Chest pain, back pain, elevated D-dimer. History of lung carcinoma. EXAM: CT ANGIOGRAPHY CHEST WITH CONTRAST TECHNIQUE: Multidetector CT imaging of the chest was performed using the standard protocol during bolus administration of intravenous contrast. Multiplanar CT image reconstructions and MIPs were obtained to evaluate the vascular anatomy. CONTRAST:  42m OMNIPAQUE IOHEXOL 350 MG/ML SOLN COMPARISON:  01/01/2015 FINDINGS: Left arm IV contrast injection. The SVC is patent. Mild right atrial enlargement. Right ventricle is nondilated. Satisfactory opacification of pulmonary arteries noted, and there is no evidence of pulmonary emboli. Patent bilateral pulmonary veins. Scattered coronary calcifications. Adequate contrast opacification of the thoracic aorta with no evidence of dissection, aneurysm, or stenosis. There is classic 3-vessel brachiocephalic arch anatomy without proximal stenosis. A small right pleural effusion has developed since prior study. No pericardial effusion. 14 x 13 mm retrosternal nodule measures slightly smaller than previous (17 x 13). Some interval increase in soft tissue attenuation consolidation in the right suprahilar region encasing proximal right upper lobe bronchi without narrowing or occlusion. Peripheral pleural-based consolidation in anterior and posterior segments of the right upper lobe and in superior segment right lower lobe, progressive since previous study. Minimal dependent atelectasis posteriorly in the lower lobes right greater than left. Small subpleural blebs in both lung apices. Thoracic spine and sternum intact. Visualized portions of upper abdomen unremarkable. Review of the MIP images confirms the above findings. IMPRESSION: 1.  Negative for acute PE or thoracic aortic dissection. 2. Worsening consolidative opacities in the right upper lobe and right suprahilar region. I do not see a a record of interval radiation therapy to this region, suggesting progression of disease, less likely superimposed multifocal pneumonia. Follow-up recommended. 3. New small right pleural effusion. Electronically Signed   By: DLucrezia EuropeM.D.   On: 03/24/2015 14:05   Ct Cervical Spine Wo Contrast  03/26/2015  CLINICAL DATA:  45year old female with trauma EXAM: CT HEAD WITHOUT CONTRAST CT CERVICAL SPINE WITHOUT CONTRAST TECHNIQUE: Multidetector CT imaging of the head and cervical spine was performed following the standard protocol without intravenous contrast. Multiplanar CT image reconstructions of the cervical spine were also generated. COMPARISON:  Head CT dated 06/10/2014 and MRI dated 06/20/2014 FINDINGS: CT HEAD FINDINGS The ventricles and the sulci are appropriate in size for the patient's age. There is no intracranial hemorrhage. No midline shift or mass effect identified. The gray-white matter differentiation is preserved. There is mild mucoperiosteal thickening of paranasal sinuses. Multiple retention cysts or polyps noted in the right maxillary sinus measuring up to 1.5 cm. The mastoid air cells are well aerated. The calvarium is intact. Right forehead hematoma. CT CERVICAL SPINE FINDINGS There is no acute fracture or subluxation of the cervical spine.The intervertebral disc spaces are preserved.The odontoid and spinous processes are intact.There is normal anatomic alignment of the C1-C2 lateral masses. The visualized soft tissues appear unremarkable. An endotracheal and enteric tubes are partially visualized. There is complete opacification of the visualized portion of the right lung. IMPRESSION: No acute intracranial pathology. No acute/traumatic cervical spine pathology. Complete opacification of the visualized portion of the right lung. A  Electronically Signed   By: AAnner CreteM.D.   On: 03/26/2015 00:32   Mr Brain Wo Contrast  03/26/2015  CLINICAL DATA:  Lung cancer with known brain metastasis treated with radiation. New onset seizures today. History of hypertension. EXAM: MRI HEAD WITHOUT CONTRAST TECHNIQUE: Multiplanar, multiecho pulse sequences of the brain and surrounding structures  were obtained without intravenous contrast. No gadolinium administered due to low GFR, which is new from prior sampling COMPARISON:  CT head March 25, 2015 and MRI of the brain January 12, 2015 FINDINGS: The ventricles and sulci are normal for patient's age. No abnormal parenchymal signal, mass effect. No reduced diffusion to suggest acute ischemia, postictal state or, hypercellular mass. Rounded FLAIR T2 hyperintense signal LEFT mesial cerebellum and LEFT posterior temporal lobe corresponding to known intracranial metastasis. No susceptibility artifact to suggest hemorrhage. No abnormal extra-axial fluid collections. No extra-axial masses though, contrast enhanced sequences would be more sensitive. Normal major intracranial vascular flow voids seen at the skull base. Ocular globes and orbital contents are nonsuspicious though not tailored for evaluation. Old LEFT medial orbital blowout fracture. No abnormal sellar expansion. No suspicious calvarial bone marrow signal. Craniocervical junction maintained. Mild paranasal sinus mucosal thickening with small RIGHT maxillary air-fluid level. The mastoid air cells are well aerated. Large RIGHT frontal and LEFT parietal scalp hematomas. IMPRESSION: No acute intracranial process. Please note, contrast not administered due to low GFR. For assessment of metastasis, MRI of the brain with contrast could be performed when patient's renal function improves. Mild vasogenic edema LEFT posterior temporal lobe, LEFT cerebellum corresponding to known metastasis. Larger RIGHT frontal, LEFT parietal scalp hematomas. Moderate  paranasal sinusitis. Electronically Signed   By: Elon Alas M.D.   On: 03/26/2015 02:37   Dg Chest Port 1 View  03/29/2015  CLINICAL DATA:  Intubation. EXAM: PORTABLE CHEST 1 VIEW COMPARISON:  03/28/2015. FINDINGS: Endotracheal tube and NG tube in stable position. Heart size is stable. Diffuse mild right lung infiltrate. Persistent atelectasis right mid lung and left lung base. Small right pleural effusion cannot be excluded. No pneumothorax. IMPRESSION: 1. Lines and tubes stable position . 2. Diffuse mild right lung infiltrate noted on today's exam. 3. Persistent right mid lung and left base subsegmental atelectasis. Continued follow-up exam suggested to demonstrate clearing. Electronically Signed   By: Marcello Moores  Register   On: 03/29/2015 07:15   Dg Chest Port 1 View  03/28/2015  CLINICAL DATA:  Intubated EXAM: PORTABLE CHEST 1 VIEW COMPARISON:  Chest radiograph from one day prior. FINDINGS: Endotracheal tube tip is 4.7 cm above the carina. Enteric tube enters the stomach with the tip not seen on this image. Stable cardiomediastinal silhouette with mild cardiomegaly. No pneumothorax. No pleural effusion. There is stable focal patchy consolidation in the right mid lung. There is mild bibasilar atelectasis, decreased. No pulmonary edema. IMPRESSION: 1. Well-positioned endotracheal and enteric tubes . 2. Stable mild cardiomegaly without pulmonary edema. 3. Stable focal patchy consolidation in the right mid lung. Electronically Signed   By: Ilona Sorrel M.D.   On: 03/28/2015 07:38   Dg Chest Port 1 View  03/27/2015  CLINICAL DATA:  Intubation. EXAM: PORTABLE CHEST 1 VIEW COMPARISON:  03/25/2015. FINDINGS: Endotracheal tube and NG tube in stable position. Cardiomegaly. Persistent but improving bilateral pulmonary infiltrates/edema. Small left pleural effusion. No pneumothorax IMPRESSION: 1. Lines and tubes in stable position. 2. Cardiomegaly with persistent but significantly improved bilateral pulmonary  infiltrates/edema. Small left pleural effusion . Electronically Signed   By: Marcello Moores  Register   On: 03/27/2015 07:22   Dg Chest Port 1 View  03/25/2015  CLINICAL DATA:  Endotracheal tube placement.  Initial encounter. EXAM: PORTABLE CHEST 1 VIEW COMPARISON:  Chest radiograph performed 03/11/2015, and CTA of the chest performed 03/24/2015 FINDINGS: The patient's endotracheal tube is seen ending 2-3 cm above the carina. There is distention of  the proximal trachea, thought to reflect mild overinflation of the endotracheal tube balloon. An enteric tube is noted extending below the diaphragm. Dense right upper lobe airspace opacification is noted, and bilateral perihilar airspace opacity, significantly worsened from the recent prior study and concerning for multifocal pneumonia. A small right pleural effusion is noted. No definite pneumothorax is seen. The cardiomediastinal silhouette is borderline normal in size. No acute osseous abnormalities are identified. IMPRESSION: 1. Endotracheal tube seen ending 2-3 cm above the carina. Distention of the proximal trachea is thought to reflect mild overinflation of the endotracheal tube balloon. This should be slightly decompressed. 2. Significantly worsened dense right upper lobe airspace opacification and bilateral perihilar opacity, concerning for worsening multifocal pneumonia. Small right pleural effusion noted. These results were called by telephone at the time of interpretation on 03/25/2015 at 11:34 pm to Dr. Carmin Muskrat, who verbally acknowledged these results. Electronically Signed   By: Garald Balding M.D.   On: 03/25/2015 23:35   US Abdomen Limited Ruq  03/24/2015  CLINICAL DATA:  Right upper quadrant abdominal pain for 1 week. EXAM: US ABDOMEN LIMITED - RIGHT UPPER QUADRANT COMPARISON:  None. FINDINGS: Gallbladder: No gallstones or wall thickening visualized. No sonographic Murphy sign noted by sonographer. Common bile duct: Diameter: 4 mm, within normal  limits. Liver: No focal lesion identified. Within normal limits in parenchymal echogenicity. IMPRESSION: Negative. No gallstones or other hepatobiliary abnormality visualized. Electronically Signed   By: Earle Gell M.D.   On: 03/24/2015 11:39    CBC  Recent Labs Lab 03/25/15 2223  03/27/15 0244 03/28/15 0413 03/29/15 0228 03/30/15 0311 03/31/15 0638  WBC 14.7*  < > 12.6* 13.5* 10.7* 14.0* 14.6*  HGB 13.1  < > 10.8* 10.0* 9.5* 11.8* 13.6  HCT 40.1  < > 33.5* 31.1* 29.8* 36.7 40.3  PLT 278  < > 244 220 248 289 295  MCV 93.3  < > 88.6 92.6 91.1 89.3 87.2  MCH 30.5  < > 28.6 29.8 29.1 28.7 29.4  MCHC 32.7  < > 32.2 32.2 31.9 32.2 33.7  RDW 16.5*  < > 17.0* 17.2* 17.2* 16.7* 16.7*  LYMPHSABS 4.0  --   --   --   --   --   --   MONOABS 1.3*  --   --   --   --   --   --   EOSABS 0.0  --   --   --   --   --   --   BASOSABS 0.0  --   --   --   --   --   --   < > = values in this interval not displayed.  Chemistries   Recent Labs Lab 03/25/15 2223  03/26/15 0247  03/27/15 2242 03/28/15 0413 03/28/15 0844 03/28/15 1620 03/28/15 2016 03/29/15 0228 03/29/15 1138 03/30/15 0311 03/31/15 0638  NA 135  < > 140  < >  --  141  --  143  --  145  --  139 139  K 4.9  < > 3.9  < >  --  5.3*  --  4.7  --  3.7  --  3.6 4.2  CL 100*  < > 109  < >  --  108  --  105  --  103  --  100* 100*  CO2 11*  --  22  < >  --  28  --  27  --  32  --  29 26  GLUCOSE 231*  < > 98  < >  --  138*  --  138*  --  129*  --  132* 109*  BUN 18  < > 14  < >  --  14  --  16  --  18  --  18 20  CREATININE 1.68*  < > 1.13*  < >  --  0.76  --  0.70  --  0.64  --  0.72 0.67  CALCIUM 9.0  --  8.1*  < >  --  8.6*  --  8.7*  --  8.6*  --  9.2 9.4  MG 2.5*  --  2.3  < > 2.4  --  2.2  --  2.1  --  1.9 1.8  --   AST 59*  --  72*  --   --   --   --   --   --   --   --   --   --   ALT 46  --  55*  --   --   --   --   --   --   --   --   --   --   ALKPHOS 101  --  72  --   --   --   --   --   --   --   --   --   --   BILITOT  0.4  --  0.4  --   --   --   --   --   --   --   --   --   --   < > = values in this interval not displayed. ------------------------------------------------------------------------------------------------------------------ estimated creatinine clearance is 108.2 mL/min (by C-G formula based on Cr of 0.67). ------------------------------------------------------------------------------------------------------------------ No results for input(s): HGBA1C in the last 72 hours. ------------------------------------------------------------------------------------------------------------------ No results for input(s): CHOL, HDL, LDLCALC, TRIG, CHOLHDL, LDLDIRECT in the last 72 hours. ------------------------------------------------------------------------------------------------------------------ No results for input(s): TSH, T4TOTAL, T3FREE, THYROIDAB in the last 72 hours.  Invalid input(s): FREET3 ------------------------------------------------------------------------------------------------------------------ No results for input(s): VITAMINB12, FOLATE, FERRITIN, TIBC, IRON, RETICCTPCT in the last 72 hours.  Coagulation profile  Recent Labs Lab 03/25/15 2223  INR 1.22    No results for input(s): DDIMER in the last 72 hours.  Cardiac Enzymes No results for input(s): CKMB, TROPONINI, MYOGLOBIN in the last 168 hours.  Invalid input(s): CK ------------------------------------------------------------------------------------------------------------------ Invalid input(s): POCBNP   Recent Labs  03/30/15 1550 03/30/15 2226 03/31/15 0021 03/31/15 0403 03/31/15 0819 03/31/15 1131  GLUCAP 127* 151* 150* 112* 107* 132*     Amillya Chavira M.D. [facility] 03/31/2015, 12:06 PM  Pager: [phone] Between 7am to 7pm - call Pager - [phone]  After 7pm go to [email or web address] - password [email or web address]  Call night coverage person covering after 7pm

## 2015-03-31 NOTE — Evaluation (Signed)
Physical Therapy Evaluation Patient Details Name: Destiny [name] MRN: [id number] DOB: April 04, 1970 Today's Date: 03/31/2015   History of Present Illness  45 y.o. 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 Intubated 1/15-19. Dx acute hypercapneic respiratory failure 2/2 seizure with inability to protect airway, tachycardia, possible new onset seizures with AMS. Pt is being followed by Palliative Medicine.  Clinical Impression  Pt admitted with above diagnosis. Pt currently with functional limitations due to the deficits listed below (see PT Problem List). On eval, pt required min assist for all functional mobility, including ambulation with RW 150 feet. +2 was utilized for chair follow as a safety precaution. Pt demo steady gait with no LOB. Pt will benefit from skilled PT to increase their independence and safety with mobility to allow discharge to the venue listed below.  Pt/family prefers d/c home. Family reports ability to provide 24-hour assist. Pt will need RW and HHPT.     Follow Up Recommendations Home health PT;Supervision/Assistance - 24 hour    Equipment Recommendations  Rolling walker with 5" wheels    Recommendations for Other Services       Precautions / Restrictions Precautions Precautions: Fall      Mobility  Bed Mobility Overal bed mobility: Needs Assistance Bed Mobility: Supine to Sit     Supine to sit: Min assist     General bed mobility comments: verbal cues for sequencing  Transfers Overall transfer level: Needs assistance Equipment used: Rolling walker (2 wheeled) Transfers: Sit to/from [facility] Sit to Stand: Min assist Stand pivot transfers: Min assist       General transfer comment: verbal cues for hand placement  Ambulation/Gait Ambulation/Gait assistance: Min assist;+2 safety/equipment Ambulation Distance (Feet): 150 Feet Assistive device:  Rolling walker (2 wheeled) Gait Pattern/deviations: Step-through pattern;Decreased stride length Gait velocity: mildly decreased Gait velocity interpretation: Below normal speed for age/gender    Stairs            Wheelchair Mobility    Modified Rankin (Stroke Patients Only)       Balance Overall balance assessment: Needs assistance Sitting-balance support: No upper extremity supported;Feet supported Sitting balance-Leahy Scale: Good     Standing balance support: Bilateral upper extremity supported;During functional activity Standing balance-Leahy Scale: Fair Standing balance comment: RW used for ambulation/stance                             Pertinent Vitals/Pain Pain Assessment: No/denies pain    Home Living Family/patient expects to be discharged to:: Private residence Living Arrangements: Spouse/significant other;Children Available Help at Discharge: Family Type of Home: Apartment Home Access: Level entry     Home Layout: Two level;Able to live on main level with bedroom/bathroom Home Equipment: Bedside commode      Prior Function Level of Independence: Independent               Hand Dominance   Dominant Hand: Right    Extremity/Trunk Assessment   Upper Extremity Assessment: Generalized weakness           Lower Extremity Assessment: Generalized weakness      Cervical / Trunk Assessment: Normal  Communication   Communication: No difficulties  Cognition Arousal/Alertness: Awake/alert Behavior During Therapy: WFL for tasks assessed/performed Overall Cognitive Status: Impaired/Different from baseline Area of Impairment: Attention;Safety/judgement;Problem solving;Memory   Current Attention Level: Selective Memory: Decreased recall of precautions;Decreased short-term memory  Safety/Judgement: Decreased awareness of safety;Decreased awareness of deficits   Problem Solving: Slow processing;Requires verbal cues       General Comments      Exercises        Assessment/Plan    PT Assessment Patient needs continued PT services  PT Diagnosis Difficulty walking;Altered mental status   PT Problem List Decreased strength;Decreased activity tolerance;Decreased balance;Decreased mobility;Decreased safety awareness;Decreased knowledge of precautions;Decreased cognition  PT Treatment Interventions DME instruction;Gait training;Stair training;Functional mobility training;Therapeutic activities;Therapeutic exercise;Patient/family education;Cognitive remediation;Balance training;Neuromuscular re-education   PT Goals (Current goals can be found in the Care Plan section) Acute Rehab PT Goals Patient Stated Goal: get better PT Goal Formulation: With patient/family Time For Goal Achievement: 04/14/15 Potential to Achieve Goals: Fair    Frequency Min 3X/week   Barriers to discharge        Co-evaluation               End of Session Equipment Utilized During Treatment: Gait belt Activity Tolerance: Patient tolerated treatment well Patient left: in chair;with family/visitor present;with call bell/phone within reach;with chair alarm set Nurse Communication: Mobility status         Time: 1005-1030 PT Time Calculation (min) (ACUTE ONLY): 25 min   Charges:   PT Evaluation $PT Eval Moderate Complexity: 1 Procedure PT Treatments $Gait Training: 8-22 mins   PT G Codes:        Lorriane Shire 03/31/2015, 11:55 AM

## 2015-03-31 NOTE — Progress Notes (Signed)
Informed MD that pt heart has been elevating up in the 120-140's. MD wanted more such as VS. VS has been obtained 138/81-115-20. She called back with orders of an EKG and Lopressor. Pt is sitting in the chair asymptomic and states that she feels fine. Will continue to monitor the pt.

## 2015-04-01 LAB — GLUCOSE, CAPILLARY
GLUCOSE-CAPILLARY: 111 mg/dL — AB (ref 65–99)
GLUCOSE-CAPILLARY: 110 mg/dL — AB (ref 65–99)
Glucose-Capillary: 122 mg/dL — ABNORMAL HIGH (ref 65–99)
Glucose-Capillary: 168 mg/dL — ABNORMAL HIGH (ref 65–99)

## 2015-04-01 MED ORDER — ALBUTEROL SULFATE HFA 108 (90 BASE) MCG/ACT IN AERS: 1.0000 | INHALATION_SPRAY | Freq: Four times a day (QID) | RESPIRATORY_TRACT | Status: DC | PRN

## 2015-04-01 MED ORDER — DEXAMETHASONE 4 MG PO TABS
4.0000 mg | ORAL_TABLET | Freq: Four times a day (QID) | ORAL | Status: DC
  Filled 2015-04-01 (×5): qty 1

## 2015-04-01 MED ORDER — LEVETIRACETAM 500 MG PO TABS: 500.0000 mg | ORAL_TABLET | Freq: Two times a day (BID) | ORAL | Status: AC

## 2015-04-01 MED ORDER — POLYETHYLENE GLYCOL 3350 17 G PO PACK: 17.0000 g | PACK | Freq: Every day | ORAL | Status: DC | PRN

## 2015-04-01 MED ORDER — LEVETIRACETAM 500 MG PO TABS
500.0000 mg | ORAL_TABLET | Freq: Two times a day (BID) | ORAL | Status: DC
  Administered 2015-04-01: 500 mg via ORAL
  Filled 2015-04-01: qty 1

## 2015-04-01 MED ORDER — IPRATROPIUM-ALBUTEROL 0.5-2.5 (3) MG/3ML IN SOLN: 3.0000 mL | Freq: Two times a day (BID) | RESPIRATORY_TRACT | Status: DC

## 2015-04-01 MED ORDER — CLONAZEPAM 0.5 MG PO TABS: 0.5000 mg | ORAL_TABLET | Freq: Two times a day (BID) | ORAL | Status: DC | PRN

## 2015-04-01 MED ORDER — DEXAMETHASONE 4 MG PO TABS: 4.0000 mg | ORAL_TABLET | Freq: Four times a day (QID) | ORAL | Status: DC

## 2015-04-01 MED ORDER — METOPROLOL TARTRATE 25 MG PO TABS: 25.0000 mg | ORAL_TABLET | Freq: Two times a day (BID) | ORAL | Status: AC

## 2015-04-01 NOTE — Care Management Note (Signed)
Case Management Note  Patient Details  Name: Destiny [name] MRN: [id number] Date of Birth: 22-Feb-1971  Subjective/Objective:                  Seizures Action/Plan: Discharge planning Expected Discharge Date:  04/01/15              Expected Discharge Plan:  [place]  In-House Referral:     Discharge planning Services  CM Consult  Post Acute Care Choice:  Home Health Choice offered to:  Patient  DME Arranged:  Walker rolling, Chirop[facility]ist DME Agency:  [place] Arranged:  RN, PT, OT, Nurse's Aide [place] Agency:  [place]  Status of Service:  Completed, signed off  Medicare Important Message Given:    Date Medicare IM Given:    Medicare IM give by:    Date Additional Medicare IM Given:    Additional Medicare Important Message give by:     If discussed at [place] of Stay Meetings, dates discussed:    Additional Comments: CM met withpt in room to offer choice of home health agency.  Pt chooses [facility] to render [facility].  Unfortunately, pt does not meet Medicaid criteria for additional [facility] services and is not willing to pay out of pocket.  Referral called to [facility] rep, Tiffany.  CM called [facility] DME rep, Merry Proud for rolling walker and nebulizer machine to be delivered to room prior to discharge today.  No other CM needs were communicated. Dellie Catholic, RN 04/01/2015, 9:00 AM

## 2015-04-01 NOTE — Progress Notes (Signed)
Nsg Discharge Note  Admit Date:  03/25/2015 Discharge date: 04/01/2015   Destiny [name] to be D/C'd Home per MD order.  AVS completed.  Copy for chart, and copy for patient signed, and dated. Patient/caregiver able to verbalize understanding.  Discharge Medication:   Medication List    STOP taking these medications        azithromycin 250 MG tablet  Commonly known as:  ZITHROMAX Z-PAK     dextromethorphan 15 MG/5ML syrup     guaiFENesin-codeine 100-10 MG/5ML syrup     HYDROcodone-acetaminophen 5-325 MG tablet  Commonly known as:  NORCO/VICODIN     hydrOXYzine 25 MG capsule  Commonly known as:  VISTARIL     levofloxacin 750 MG tablet  Commonly known as:  LEVAQUIN     lidocaine 2 % solution  Commonly known as:  XYLOCAINE     ondansetron 8 MG tablet  Commonly known as:  ZOFRAN     sucralfate 1 GM/10ML suspension  Commonly known as:  CARAFATE      TAKE these medications        acetaminophen 80 MG chewable tablet  Commonly known as:  TYLENOL  Chew 80 mg by mouth every 6 (six) hours as needed for mild pain.     albuterol 108 (90 Base) MCG/ACT inhaler  Commonly known as:  PROVENTIL HFA;VENTOLIN HFA  Inhale 1-2 puffs into the lungs every 6 (six) hours as needed for wheezing or shortness of breath (ICD 10 J44.9).     clonazePAM 0.5 MG tablet  Commonly known as:  KLONOPIN  Take 1 tablet (0.5 mg total) by mouth 2 (two) times daily as needed for anxiety.     dexamethasone 4 MG tablet  Commonly known as:  DECADRON  Take 1 tablet (4 mg total) by mouth 4 (four) times daily.     folic acid 1 MG tablet  Commonly known as:  FOLVITE  Take 1 tablet (1 mg total) by mouth daily. Start 5-7 days before Alimta chemotherapy. Continue until 21 days after Alimta completed.     ibuprofen 200 MG tablet  Commonly known as:  ADVIL,MOTRIN  Take 400 mg by mouth every 6 (six) hours as needed for moderate pain. Reported on 03/22/2015     ipratropium-albuterol 0.5-2.5 (3) MG/3ML Soln   Commonly known as:  DUONEB  Take 3 mLs by nebulization 2 (two) times daily.     levETIRAcetam 500 MG tablet  Commonly known as:  KEPPRA  Take 1 tablet (500 mg total) by mouth 2 (two) times daily.     metoprolol tartrate 25 MG tablet  Commonly known as:  LOPRESSOR  Take 1 tablet (25 mg total) by mouth 2 (two) times daily.     mirtazapine 30 MG tablet  Commonly known as:  REMERON  Take 30 mg by mouth at bedtime as needed (depression, sleep). Reported on 03/22/2015     pantoprazole 40 MG tablet  Commonly known as:  PROTONIX  Take 40 mg by mouth 2 (two) times daily before a meal. Reported on 03/22/2015     polyethylene glycol packet  Commonly known as:  MIRALAX / GLYCOLAX  Take 17 g by mouth daily as needed for moderate constipation.        Discharge Assessment: Filed Vitals:   03/31/15 2150 04/01/15 0504  BP:  133/89  Pulse: 96 85  Temp:  98.5 F (36.9 C)  Resp:  16   Skin clean, dry and intact without evidence of skin break down, no  evidence of skin tears noted. IV catheter discontinued intact. Site without signs and symptoms of complications - no redness or edema noted at insertion site, patient denies c/o pain - only slight tenderness at site.  Dressing with slight pressure applied.  D/c Instructions-Education: Discharge instructions given to patient/family with verbalized understanding. D/c education completed with patient/family including follow up instructions, medication list, d/c activities limitations if indicated, with other d/c instructions as indicated by MD - patient able to verbalize understanding, all questions fully answered. Patient instructed to return to ED, call 911, or call MD for any changes in condition.  Patient escorted via [place], and D/C home via private auto.  Salley Slaughter, RN 04/01/2015 12:04 PM

## 2015-04-01 NOTE — Discharge Summary (Signed)
Physician Discharge Summary   Patient ID: Destiny [name] MRN: [id number] DOB/AGE: 10-Jun-1970 45 y.o.  Admit date: 03/25/2015 Discharge date: 04/01/2015  Primary Care Physician:  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 History of SVTs  . acute kidney injury Normocytic anemia  Consults:  Patient was admitted by critical care service  Recommendations for Outpatient Follow-up:  1. Home health PT, OT, patient had appendectomy, RN, home health aide arranged by case management 2. Please repeat CBC/BMET at next visit   DIET: Dysphagia 1 diet with nectar thick liquids    Allergies:  No Known Allergies   DISCHARGE MEDICATIONS: Discharge Medication List as of 04/01/2015 11:23 AM    START taking these medications   Details  albuterol (PROVENTIL HFA;VENTOLIN HFA) 108 (90 Base) MCG/ACT inhaler Inhale 1-2 puffs into the lungs every 6 (six) hours as needed for wheezing or shortness of breath (ICD 10 J44.9)., Starting 04/01/2015, Until Discontinued, Print    ipratropium-albuterol (DUONEB) 0.5-2.5 (3) MG/3ML SOLN Take 3 mLs by nebulization 2 (two) times daily., Starting 04/01/2015, Until Discontinued, Print    levETIRAcetam (KEPPRA) 500 MG tablet Take 1 tablet (500 mg total) by mouth 2 (two) times daily., Starting 04/01/2015, Until Discontinued, Print    metoprolol tartrate (LOPRESSOR) 25 MG tablet Take 1 tablet (25 mg total) by mouth 2 (two) times daily., Starting 04/01/2015, Until Discontinued, Print    polyethylene glycol (MIRALAX / GLYCOLAX) packet Take 17 g by mouth daily as needed for moderate constipation., Starting 04/01/2015, Until Discontinued, Print      CONTINUE these medications which have CHANGED   Details  clonazePAM (KLONOPIN) 0.5 MG tablet Take 1 tablet (0.5 mg total) by mouth 2 (two)  times daily as needed for anxiety., Starting 04/01/2015, Until Discontinued, Print    dexamethasone (DECADRON) 4 MG tablet Take 1 tablet (4 mg total) by mouth 4 (four) times daily., Starting 04/01/2015, Until Discontinued, Print      CONTINUE these medications which have NOT CHANGED   Details  acetaminophen (TYLENOL) 80 MG chewable tablet Chew 80 mg by mouth every 6 (six) hours as needed for mild pain., Until Discontinued, Historical Med    folic acid (FOLVITE) 1 MG tablet Take 1 tablet (1 mg total) by mouth daily. Start 5-7 days before Alimta chemotherapy. Continue until 21 days after Alimta completed., Starting 02/13/2015, Until Discontinued, Normal    ibuprofen (ADVIL,MOTRIN) 200 MG tablet Take 400 mg by mouth every 6 (six) hours as needed for moderate pain. Reported on 03/22/2015, Until Discontinued, Historical Med    mirtazapine (REMERON) 30 MG tablet Take 30 mg by mouth at bedtime as needed (depression, sleep). Reported on 03/22/2015, Starting 11/09/2014, Until Discontinued, Historical Med    pantoprazole (PROTONIX) 40 MG tablet Take 40 mg by mouth 2 (two) times daily before a meal. Reported on 03/22/2015, Starting 08/28/2014, Until Tue 08/28/15, Historical Med      STOP taking these medications     azithromycin (ZITHROMAX Z-PAK) 250 MG tablet      guaiFENesin-codeine 100-10 MG/5ML syrup      HYDROcodone-acetaminophen (NORCO/VICODIN) 5-325 MG tablet      levofloxacin (LEVAQUIN) 750 MG tablet      dextromethorphan 15 MG/5ML syrup      hydrOXYzine (VISTARIL) 25 MG capsule      lidocaine (XYLOCAINE) 2 % solution      ondansetron (ZOFRAN)  8 MG tablet      sucralfate (CARAFATE) 1 GM/10ML suspension          Brief H and P: For complete details please refer to admission H and P, but in brief  Per critical care note on 1/15: The patient is a 45F w/ history of stage IV nsclc (adeno) w/ mets to temporal lobe diagnosed in April 2016 who most recently was treated with SRS to a cerebellar  lesion in 01/2015. She was seen by radiation oncology on 03/22/15 and dexamethasone was discontinued with plans for repeat MRI in one month. On 1/14, she presented to the ED with complaints of right-sided chest pain and non-productive cough. A CTA PE protocol was obtained and negative for PE but did show some right-sided infiltrates. She was sent home with a course of levaquin.  Next evening on 03/25/15, patient was found to be Unresponsive on the floor. EMS was called and found her to be seizing with "left-sided gaze, fixed and dilated pupils and a hematoma on her right forehead". They gave a total of 7.'5mg'$  versed and 2 of narcan with no change in her status. On arrival to the ED she was intubated for airway protection. Patient was admitted to ICU by PC CM.  The patient was transferred to the floor on 03/30/15, [place] service assumed care on 1/21  Hospital Course:  Acute hypercapnic respiratory failure in the setting of stage IV right lung CA, recent pneumonia, presumed aspiration event - Currently resolved, O2 sats 100% on room air - Patient has completed full 7 day course of IV antibiotics during the hospitalization with broad-spectrum IV vancomycin and Zosyn. - Blood cultures remained negative to date - Patient was intubated on admission, extubated on 1/19 -Speech evaluation, done, recommended dysphagia 1 diet  - DME nebs were arranged   Acute encephalopathy: Resolved, Possibly due to new onset seizures, decrease his seizure thought to old in setting of recent infection, dexamethasone was stopped on 1/12 - Per daughter at the bedside 1/21, mental status close to baseline - EEG on 1/16 did not show seizure activity - Patient has known brain metastasis from lung CA - The patient was started on Keppra and dexamethasone. Patient will continue on Keppra and Decadron. She has a follow-up appointment with Dr. 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 [name], outpatient tapering off dexamethasone.  Generalized deconditioning - Patient is still very deconditioned, PTOT evaluation was done and recommended home health PT which was arranged by case management  Tachycardia/history of SVTs -Asymptomatic, placed on scheduled metoprolol   Acute kidney injury - Likely due to #1, resolved  Normocytic anemia - Currently stable, follow H&H  Day of Discharge BP 133/89 mmHg  Pulse 85  Temp(Src) 98.5 F (36.9 C) (Oral)  Resp 16  Ht '5\' 8"'$  (1.727 m)  Wt 95.7 kg (210 lb 15.7 oz)  BMI 32.09 kg/m2  SpO2 96%  LMP 03/16/2015  Physical Exam: General: Alert and awake oriented x3 not in any acute distress. HEENT: anicteric sclera, pupils reactive to light and accommodation CVS: S1-S2 clear no murmur rubs or gallops Chest: clear to auscultation bilaterally, no wheezing rales or rhonchi Abdomen: soft nontender, nondistended, normal bowel sounds Extremities: no cyanosis, clubbing or edema noted bilaterally Neuro: Cranial nerves II-XII intact, no focal neurological deficits   The results of significant diagnostics from this hospitalization (including imaging, microbiology, ancillary and laboratory) are  listed below for reference.    LAB RESULTS: Basic Metabolic Panel:  Recent Labs Lab 03/30/15 0311 03/31/15 0638  NA 139 139  K 3.6 4.2  CL 100* 100*  CO2 29 26  GLUCOSE 132* 109*  BUN 18 20  CREATININE 0.72 0.67  CALCIUM 9.2 9.4  MG 1.8  --   PHOS 3.4  --    Liver Function Tests:  Recent Labs Lab 03/25/15 2223 03/26/15 0247  AST 59* 72*  ALT 46 55*  ALKPHOS 101 72  BILITOT 0.4 0.4  PROT 7.0 5.9*  ALBUMIN 3.4* 3.0*   No results for input(s): LIPASE, AMYLASE in the last 168 hours. No results for input(s): AMMONIA in the last 168 hours. CBC:  Recent Labs Lab 03/25/15 2223  03/30/15 0311 03/31/15 0638  WBC 14.7*  < > 14.0* 14.6*  NEUTROABS 9.4*  --   --   --   HGB 13.1  <  > 11.8* 13.6  HCT 40.1  < > 36.7 40.3  MCV 93.3  < > 89.3 87.2  PLT 278  < > 289 295  < > = values in this interval not displayed. Cardiac Enzymes: No results for input(s): CKTOTAL, CKMB, CKMBINDEX, TROPONINI in the last 168 hours. BNP: Invalid input(s): POCBNP CBG:  Recent Labs Lab 04/01/15 0502 04/01/15 0809  GLUCAP 110* 122*    Significant Diagnostic Studies:  Ct Head Wo Contrast  03/26/2015  CLINICAL DATA:  45 year old female with trauma EXAM: CT HEAD WITHOUT CONTRAST CT CERVICAL SPINE WITHOUT CONTRAST TECHNIQUE: Multidetector CT imaging of the head and cervical spine was performed following the standard protocol without intravenous contrast. Multiplanar CT image reconstructions of the cervical spine were also generated. COMPARISON:  Head CT dated 06/10/2014 and MRI dated 06/20/2014 FINDINGS: CT HEAD FINDINGS The ventricles and the sulci are appropriate in size for the patient's age. There is no intracranial hemorrhage. No midline shift or mass effect identified. The gray-white matter differentiation is preserved. There is mild mucoperiosteal thickening of paranasal sinuses. Multiple retention cysts or polyps noted in the right maxillary sinus measuring up to 1.5 cm. The mastoid air cells are well aerated. The calvarium is intact. Right forehead hematoma. CT CERVICAL SPINE FINDINGS There is no acute fracture or subluxation of the cervical spine.The intervertebral disc spaces are preserved.The odontoid and spinous processes are intact.There is normal anatomic alignment of the C1-C2 lateral masses. The visualized soft tissues appear unremarkable. An endotracheal and enteric tubes are partially visualized. There is complete opacification of the visualized portion of the right lung. IMPRESSION: No acute intracranial pathology. No acute/traumatic cervical spine pathology. Complete opacification of the visualized portion of the right lung. A Electronically Signed   By: Anner Crete M.D.   On:  03/26/2015 00:32   Ct Cervical Spine Wo Contrast  03/26/2015  CLINICAL DATA:  45 year old female with trauma EXAM: CT HEAD WITHOUT CONTRAST CT CERVICAL SPINE WITHOUT CONTRAST TECHNIQUE: Multidetector CT imaging of the head and cervical spine was performed following the standard protocol without intravenous contrast. Multiplanar CT image reconstructions of the cervical spine were also generated. COMPARISON:  Head CT dated 06/10/2014 and MRI dated 06/20/2014 FINDINGS: CT HEAD FINDINGS The ventricles and the sulci are appropriate in size for the patient's age. There is no intracranial hemorrhage. No midline shift or mass effect identified. The gray-white matter differentiation is preserved. There is mild mucoperiosteal thickening of paranasal sinuses. Multiple retention cysts or polyps noted in the right maxillary sinus measuring up to 1.5 cm. The mastoid  air cells are well aerated. The calvarium is intact. Right forehead hematoma. CT CERVICAL SPINE FINDINGS There is no acute fracture or subluxation of the cervical spine.The intervertebral disc spaces are preserved.The odontoid and spinous processes are intact.There is normal anatomic alignment of the C1-C2 lateral masses. The visualized soft tissues appear unremarkable. An endotracheal and enteric tubes are partially visualized. There is complete opacification of the visualized portion of the right lung. IMPRESSION: No acute intracranial pathology. No acute/traumatic cervical spine pathology. Complete opacification of the visualized portion of the right lung. A Electronically Signed   By: Anner Crete M.D.   On: 03/26/2015 00:32   Mr Brain Wo Contrast  03/26/2015  CLINICAL DATA:  Lung cancer with known brain metastasis treated with radiation. New onset seizures today. History of hypertension. EXAM: MRI HEAD WITHOUT CONTRAST TECHNIQUE: Multiplanar, multiecho pulse sequences of the brain and surrounding structures were obtained without intravenous contrast. No  gadolinium administered due to low GFR, which is new from prior sampling COMPARISON:  CT head March 25, 2015 and MRI of the brain January 12, 2015 FINDINGS: The ventricles and sulci are normal for patient's age. No abnormal parenchymal signal, mass effect. No reduced diffusion to suggest acute ischemia, postictal state or, hypercellular mass. Rounded FLAIR T2 hyperintense signal LEFT mesial cerebellum and LEFT posterior temporal lobe corresponding to known intracranial metastasis. No susceptibility artifact to suggest hemorrhage. No abnormal extra-axial fluid collections. No extra-axial masses though, contrast enhanced sequences would be more sensitive. Normal major intracranial vascular flow voids seen at the skull base. Ocular globes and orbital contents are nonsuspicious though not tailored for evaluation. Old LEFT medial orbital blowout fracture. No abnormal sellar expansion. No suspicious calvarial bone marrow signal. Craniocervical junction maintained. Mild paranasal sinus mucosal thickening with small RIGHT maxillary air-fluid level. The mastoid air cells are well aerated. Large RIGHT frontal and LEFT parietal scalp hematomas. IMPRESSION: No acute intracranial process. Please note, contrast not administered due to low GFR. For assessment of metastasis, MRI of the brain with contrast could be performed when patient's renal function improves. Mild vasogenic edema LEFT posterior temporal lobe, LEFT cerebellum corresponding to known metastasis. Larger RIGHT frontal, LEFT parietal scalp hematomas. Moderate paranasal sinusitis. Electronically Signed   By: Elon Alas M.D.   On: 03/26/2015 02:37   Dg Chest Port 1 View  03/25/2015  CLINICAL DATA:  Endotracheal tube placement.  Initial encounter. EXAM: PORTABLE CHEST 1 VIEW COMPARISON:  Chest radiograph performed 03/11/2015, and CTA of the chest performed 03/24/2015 FINDINGS: The patient's endotracheal tube is seen ending 2-3 cm above the carina. There is  distention of the proximal trachea, thought to reflect mild overinflation of the endotracheal tube balloon. An enteric tube is noted extending below the diaphragm. Dense right upper lobe airspace opacification is noted, and bilateral perihilar airspace opacity, significantly worsened from the recent prior study and concerning for multifocal pneumonia. A small right pleural effusion is noted. No definite pneumothorax is seen. The cardiomediastinal silhouette is borderline normal in size. No acute osseous abnormalities are identified. IMPRESSION: 1. Endotracheal tube seen ending 2-3 cm above the carina. Distention of the proximal trachea is thought to reflect mild overinflation of the endotracheal tube balloon. This should be slightly decompressed. 2. Significantly worsened dense right upper lobe airspace opacification and bilateral perihilar opacity, concerning for worsening multifocal pneumonia. Small right pleural effusion noted. These results were called by telephone at the time of interpretation on 03/25/2015 at 11:34 pm to Dr. Carmin Muskrat, who verbally acknowledged these results. Electronically  Signed   By: Garald Balding M.D.   On: 03/25/2015 23:35    2D ECHO:   Disposition and Follow-up: Discharge Instructions    Discharge instructions    Complete by:  As directed   DIET: Dysphagia 1, nectar thick  Please continue dexamethasone (decadron) '4mg'$  q6hours (4 times a day) until you follow with Dr Tammi Klippel or Dr [name] and taper as advised by them.     Increase activity slowly    Complete by:  As directed             DISPOSITION: Home with home health   DISCHARGE FOLLOW-UP Follow-up Information    Follow up with Tyler Pita, MD. Schedule an appointment as soon as possible for a visit in 10 days.   Specialty:  Radiation Oncology   Why:  for hospital follow-up   Contact information:   [address] [place] [address] [phone]       Follow up with Rulon Eisenmenger,  MD On 04/12/2015.   Specialty:  Hematology and Oncology   Why:  at 1:30PM , for hospital follow-up   Contact information:   [address] [place] [address] [phone]       Follow up with [facility].   Why:  nebulizer machine and rolling walker   Contact information:   [place] [address] [phone]       Follow up with [facility].   Why:  nurse   Contact information:   [address] [place] [address] [phone]        Time spent on Discharge: 107mns   Signed:   RAI,RIPUDEEP M.D. [facility] 04/01/2015, 12:07 PM Pager: [phone]

## 2015-04-03 ENCOUNTER — Telehealth: Payer: Self-pay | Admitting: Radiation Oncology

## 2015-04-03 NOTE — Telephone Encounter (Addendum)
Phoned patient back. Explained that per Dr. Tammi Klippel and Shona Simpson, PA she should continues protonix bid and add Tums or Zantac for additional relief. Offered to call in Carafate. Patient refused and states, "that stuff don't help." Went on to explain that per Lucretia at [facility] she could pick up a face mask for her nebuilizer at the retail store and no prescription was required. Patient verbalized understanding and expressed appreciation for the call.

## 2015-04-04 ENCOUNTER — Other Ambulatory Visit: Payer: Self-pay | Admitting: Radiation Therapy

## 2015-04-04 ENCOUNTER — Encounter: Payer: Self-pay | Admitting: Radiation Therapy

## 2015-04-04 DIAGNOSIS — C7931 Secondary malignant neoplasm of brain: Secondary | ICD-10-CM

## 2015-04-04 DIAGNOSIS — C7949 Secondary malignant neoplasm of other parts of nervous system: Principal | ICD-10-CM

## 2015-04-04 NOTE — Progress Notes (Addendum)
  1. Do you need a wheel chair? no  2. On oxygen? no  3. Have you ever had any surgery in the body part being scanned? no  4. Have you ever had any surgery on your brain or heart? no   5. Have you ever had surgery on your eyes or ears? no   6. Do you have a pacemaker or defibrillator? no  7. Do you have a Neurostimulator? No  8. Claustrophobic? Yes takes Ativan  9. Any risk for metal in eyes? no  10. Injury by bullet, buckshot, or shrapnel? no  11. Stent? no   12. Hx of Cancer? Yes. Lung met to brain    13. Kidney or Liver disease? recently had a hospital admission with an elevated Creat, but that has resolved.   14. Hx of Lupus, Rheumatoid Arthritis or Scleroderma? no  15. IV Antibiotics or long term use of NSAIDS? no  16. HX of Hypertension? yes  17. Diabetes? no  18. Allergy to contrast? no  19. Recent labs. Labs to be drawn on 2/2 - results will be in [facility]

## 2015-04-05 ENCOUNTER — Telehealth: Payer: Self-pay | Admitting: Radiation Oncology

## 2015-04-05 NOTE — Telephone Encounter (Signed)
Received call from patient requesting pain medication for bilateral knee pain. Patient reports pain is 10 on a scale of 0-10 despite using extra strength Tylenol and icy hot rub. Reports pain is so severe she is unable to walk. Patient understands this RN will inform Dr. Tammi Klippel and phoned her back with further directions. Encouraged patient to present to the emergency room if her pain becomes intolerable or her knees begin to swell. Patient verbalized understanding.

## 2015-04-06 ENCOUNTER — Emergency Department (HOSPITAL_COMMUNITY)
Admission: EM | Admit: 2015-04-06 | Discharge: 2015-04-07 | Disposition: A | Discharge status: Home / Self Care | Payer: Medicaid Other | Attending: Emergency Medicine | Admitting: Emergency Medicine

## 2015-04-06 ENCOUNTER — Encounter (HOSPITAL_COMMUNITY): Payer: Self-pay | Admitting: Family Medicine

## 2015-04-06 ENCOUNTER — Telehealth: Payer: Self-pay | Admitting: Radiation Oncology

## 2015-04-06 ENCOUNTER — Telehealth: Payer: Self-pay

## 2015-04-06 DIAGNOSIS — Z7952 Long term (current) use of systemic steroids: Secondary | ICD-10-CM | POA: Insufficient documentation

## 2015-04-06 DIAGNOSIS — F17210 Nicotine dependence, cigarettes, uncomplicated: Secondary | ICD-10-CM | POA: Diagnosis not present

## 2015-04-06 DIAGNOSIS — Z85841 Personal history of malignant neoplasm of brain: Secondary | ICD-10-CM | POA: Insufficient documentation

## 2015-04-06 DIAGNOSIS — Z8639 Personal history of other endocrine, nutritional and metabolic disease: Secondary | ICD-10-CM | POA: Insufficient documentation

## 2015-04-06 DIAGNOSIS — Z79899 Other long term (current) drug therapy: Secondary | ICD-10-CM | POA: Diagnosis not present

## 2015-04-06 DIAGNOSIS — R609 Edema, unspecified: Secondary | ICD-10-CM

## 2015-04-06 DIAGNOSIS — R2242 Localized swelling, mass and lump, left lower limb: Secondary | ICD-10-CM | POA: Diagnosis present

## 2015-04-06 DIAGNOSIS — Z85118 Personal history of other malignant neoplasm of bronchus and lung: Secondary | ICD-10-CM | POA: Diagnosis not present

## 2015-04-06 DIAGNOSIS — R600 Localized edema: Secondary | ICD-10-CM | POA: Insufficient documentation

## 2015-04-06 DIAGNOSIS — I10 Essential (primary) hypertension: Secondary | ICD-10-CM | POA: Diagnosis not present

## 2015-04-06 DIAGNOSIS — F329 Major depressive disorder, single episode, unspecified: Secondary | ICD-10-CM | POA: Diagnosis not present

## 2015-04-06 LAB — I-STAT CHEM 8, ED
BUN: 17 mg/dL (ref 6–20)
CHLORIDE: 101 mmol/L (ref 101–111)
Calcium, Ion: 1.19 mmol/L (ref 1.12–1.23)
Creatinine, Ser: 0.6 mg/dL (ref 0.44–1.00)
GLUCOSE: 120 mg/dL — AB (ref 65–99)
HEMATOCRIT: 40 % (ref 36.0–46.0)
HEMOGLOBIN: 13.6 g/dL (ref 12.0–15.0)
POTASSIUM: 4.3 mmol/L (ref 3.5–5.1)
SODIUM: 138 mmol/L (ref 135–145)
TCO2: 27 mmol/L (ref 0–100)

## 2015-04-06 MED ORDER — FUROSEMIDE 20 MG PO TABS: 20.0000 mg | ORAL_TABLET | Freq: Every day | ORAL | Status: DC

## 2015-04-06 MED ORDER — OXYCODONE-ACETAMINOPHEN 5-325 MG PO TABS
1.0000 | ORAL_TABLET | Freq: Once | ORAL | Status: AC
  Administered 2015-04-06: 1 via ORAL

## 2015-04-06 MED ORDER — FUROSEMIDE 20 MG PO TABS
40.0000 mg | ORAL_TABLET | Freq: Once | ORAL | Status: AC
  Administered 2015-04-06: 40 mg via ORAL
  Filled 2015-04-06: qty 2

## 2015-04-06 MED ORDER — OXYCODONE-ACETAMINOPHEN 5-325 MG PO TABS
ORAL_TABLET | ORAL | Status: AC
  Filled 2015-04-06: qty 1

## 2015-04-06 NOTE — ED Notes (Signed)
Pt here for bilateral knee pain, leg pain and swelling.

## 2015-04-06 NOTE — Telephone Encounter (Signed)
Per Dr. [name], Destiny [name] should be taking care of pt pain meds.  Writer called RN Freda Munro at [facility] and provided name and phone number.  Reports sent to scan.

## 2015-04-06 NOTE — Discharge Instructions (Signed)

## 2015-04-06 NOTE — Telephone Encounter (Signed)
Phoned patient's home. Patient unavailable. Spoke with patient's [name]. Explained that [facility] had called yesterday complaining of knee pain. Strongly encouraged [name] to have Destiny [name] follow up with her PCP reference this matter. Explained the knee pain is unrelated to radiation however,  since her syncopal episode and hospitalization any issues that arise must be evaluated. [name] verbalized understanding.

## 2015-04-06 NOTE — ED Notes (Signed)
Pt stable, ambulatory, states understanding of discharge instructions 

## 2015-04-06 NOTE — ED Notes (Signed)
Pt c/o pain 9/10.  Elevated legs.

## 2015-04-06 NOTE — ED Notes (Addendum)
Mini-Lab to run [employer].

## 2015-04-07 NOTE — ED Provider Notes (Signed)
CSN: [id number]     Arrival date & time 04/06/15  1738 History   First MD Initiated Contact with Patient 04/06/15 1952     Chief Complaint  Patient presents with  . Leg Swelling     HPI Patient presents to the emergency department complaining of increasing lower extremity swelling bilaterally over the past 4-5 days.  She reports no improvement with elevation.  She reports dietary indiscretion reports that she is eating 50-60 ice cream sandwiches this week.  She reports other dietary indiscretions with high sodium.  She denies a history of DVT or pulmonary embolism.  No family history of the same.  She denies chest pain or shortness of breath.  Denies orthopnea.  Reports she is urinating normally.  Denies back or flank pain.  Reports that her legs feel heavy and painful.  No rash or redness noted by the patient   Past Medical History  Diagnosis Date  . Thyroid nodule   . Depression   . Suicide attempt ([place])   . SVT (supraventricular tachycardia) ([place])     a. Long RP tachycardia;  b. 01/2014 s/p RFCA.  . Metastatic lung cancer (metastasis from lung to other site) ([place]) 06/2014    Stage IV (T3, N2, M1b) non-small cell lung cancer, adenocarcinoma; treated with chemo radiation (radiation completed 09/04/14).  Brain met treated with radiation 06/2014.   . Brain metastases ([place])     left brain met treated with radiation 06/2014. MRI 7/27 with new right brain met and shrunken left met.   . Hypertension    Past Surgical History  Procedure Laterality Date  . Thyroid surgery      Removed thyroid nodule, states still has thyroid; 2010  . Supraventricular tachycardia ablation N/A 01/16/2014    Procedure: SUPRAVENTRICULAR TACHYCARDIA ABLATION;  Surgeon: Evans Lance, MD;  Location: [facility] CATH LAB;  Service: Cardiovascular;  Laterality: N/A;  . Tee without cardioversion N/A 06/12/2014    Procedure: TRANSESOPHAGEAL ECHOCARDIOGRAM (TEE);  Surgeon: Sueanne Margarita, MD;  Location: [facility] ENDOSCOPY;  Service:  Cardiovascular;  Laterality: N/A;   Family History  Problem Relation Age of Onset  . Seizures Mother   . Colon cancer Father   . Hypertension Father   . Diabetes Maternal Grandmother   . Diabetes Sister   . Kidney disease Neg Hx   . Liver disease Neg Hx   . Stomach cancer Neg Hx   . Esophageal cancer Neg Hx   . Thyroid cancer Sister    Social History  Substance Use Topics  . Smoking status: Current Every Day Smoker -- 1.00 packs/day for 30 years    Types: Cigarettes    Last Attempt to Quit: 06/13/2014  . Smokeless tobacco: Current User     Comment: form given 12/29/14, vapor  . 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     Allergies  Review of patient's allergies indicates no known allergies.  Home Medications   Prior to Admission medications   Medication Sig Start Date End Date Taking? Authorizing Provider  acetaminophen (TYLENOL) 500 MG tablet Take 1,000 mg by mouth every 6 (six) hours as needed.   Yes Historical Provider, MD  albuterol (PROVENTIL HFA;VENTOLIN HFA) 108 (90 Base) MCG/ACT inhaler Inhale 1-2 puffs into the lungs every 6 (six) hours as needed for  wheezing or shortness of breath (ICD 10 J44.9). 04/01/15  Yes Ripudeep Krystal Eaton, MD  dexamethasone (DECADRON) 4 MG tablet Take 1 tablet (4 mg total) by mouth 4 (four) times daily. 04/01/15  Yes Ripudeep K Rai, MD  ipratropium-albuterol (DUONEB) 0.5-2.5 (3) MG/3ML SOLN Take 3 mLs by nebulization 2 (two) times daily. 04/01/15  Yes Ripudeep Krystal Eaton, MD  levETIRAcetam (KEPPRA) 500 MG tablet Take 1 tablet (500 mg total) by mouth 2 (two) times daily. 04/01/15  Yes Ripudeep Krystal Eaton, MD  metoprolol tartrate (LOPRESSOR) 25 MG tablet Take 1 tablet (25 mg total) by mouth 2 (two) times daily. 04/01/15  Yes Ripudeep Krystal Eaton, MD  pantoprazole (PROTONIX) 40 MG tablet Take 40 mg by mouth 2 (two) times daily before  a meal. Reported on 03/22/2015 08/28/14 08/28/15 Yes Historical Provider, MD  clonazePAM (KLONOPIN) 0.5 MG tablet Take 1 tablet (0.5 mg total) by mouth 2 (two) times daily as needed for anxiety. Patient not taking: Reported on 04/06/2015 04/01/15   Ripudeep Krystal Eaton, MD  folic acid (FOLVITE) 1 MG tablet Take 1 tablet (1 mg total) by mouth daily. Start 5-7 days before Alimta chemotherapy. Continue until 21 days after Alimta completed. Patient not taking: Reported on 04/06/2015 02/13/15   Nicholas Lose, MD  furosemide (LASIX) 20 MG tablet Take 1 tablet (20 mg total) by mouth daily. 04/06/15   Jola Schmidt, MD  ibuprofen (ADVIL,MOTRIN) 200 MG tablet Take 400 mg by mouth every 6 (six) hours as needed for moderate pain. Reported on 04/06/2015    Historical Provider, MD  mirtazapine (REMERON) 30 MG tablet Take 30 mg by mouth at bedtime as needed (depression, sleep). Reported on 04/06/2015 11/09/14   Historical Provider, MD  polyethylene glycol (MIRALAX / GLYCOLAX) packet Take 17 g by mouth daily as needed for moderate constipation. 04/01/15   Ripudeep K Rai, MD   BP 157/69 mmHg  Pulse 78  Temp(Src) 98 F (36.7 C)  Resp 20  Wt 223 lb 8 oz (101.379 kg)  SpO2 99%  LMP 03/16/2015 Physical Exam  Constitutional: She is oriented to person, place, and time. She appears well-developed and well-nourished. No distress.  HENT:  Head: Normocephalic and atraumatic.  Eyes: EOM are normal.  Neck: Normal range of motion.  Cardiovascular: Normal rate, regular rhythm and normal heart sounds.   Pulmonary/Chest: Effort normal and breath sounds normal.  Abdominal: Soft. She exhibits no distension. There is no tenderness.  Musculoskeletal: Normal range of motion. She exhibits edema (1+ bilaterally) and tenderness.  Normal PT and DP pulses bilaterally  Neurological: She is alert and oriented to person, place, and time.  Skin: Skin is warm and dry.  Psychiatric: She has a normal mood and affect. Judgment normal.  Nursing note and  vitals reviewed.   ED Course  Procedures (including critical care time) Labs Review Labs Reviewed  I-STAT CHEM 8, ED - Abnormal; Notable for the following:    Glucose, Bld 120 (*)    All other components within normal limits    Imaging Review No results found. I have personally reviewed and evaluated these images and lab results as part of my medical decision-making.   EKG Interpretation None      MDM   Final diagnoses:  Peripheral edema   suspect peripheral edema/venous insufficiency.  Recommended salt restriction, elevation, compression stockings.  The DVT.  Renal function normal    Jola Schmidt, MD 04/07/15 [phone]

## 2015-04-10 ENCOUNTER — Telehealth: Payer: Self-pay | Admitting: Radiation Oncology

## 2015-04-10 NOTE — Telephone Encounter (Signed)
Returned message left by patient. Patient requesting clarification. Patient reports her step son is telling her "she was out of it for a week." Confirmed that she was in fact hospitalized and intubated after being found unresponsive on 03/24/2015. Patient questions if she "has to have 24 hour supervision now." Explained she does not have to have 24 hour supervision but, that a plan must be in place to ensure she gets in all her medication on time for help prevent future episodes. Patient verbalized understanding.

## 2015-04-12 ENCOUNTER — Other Ambulatory Visit (HOSPITAL_BASED_OUTPATIENT_CLINIC_OR_DEPARTMENT_OTHER): Payer: Medicaid Other

## 2015-04-12 ENCOUNTER — Encounter: Payer: Self-pay | Admitting: Hematology and Oncology

## 2015-04-12 ENCOUNTER — Telehealth: Payer: Self-pay | Admitting: Hematology and Oncology

## 2015-04-12 ENCOUNTER — Telehealth: Payer: Self-pay | Admitting: Radiation Oncology

## 2015-04-12 ENCOUNTER — Ambulatory Visit (HOSPITAL_BASED_OUTPATIENT_CLINIC_OR_DEPARTMENT_OTHER): Payer: Medicaid Other | Admitting: Hematology and Oncology

## 2015-04-12 VITALS — BP 140/68 | HR 79 | Temp 97.8°F | Resp 18 | Ht 68.0 in | Wt 223.9 lb

## 2015-04-12 DIAGNOSIS — C7931 Secondary malignant neoplasm of brain: Secondary | ICD-10-CM | POA: Diagnosis not present

## 2015-04-12 DIAGNOSIS — C3411 Malignant neoplasm of upper lobe, right bronchus or lung: Secondary | ICD-10-CM

## 2015-04-12 LAB — CBC WITH DIFFERENTIAL/PLATELET
BASO%: 0.1 % (ref 0.0–2.0)
Basophils Absolute: 0 10*3/uL (ref 0.0–0.1)
EOS%: 0.1 % (ref 0.0–7.0)
Eosinophils Absolute: 0 10*3/uL (ref 0.0–0.5)
HEMATOCRIT: 35.3 % (ref 34.8–46.6)
HEMOGLOBIN: 12 g/dL (ref 11.6–15.9)
LYMPH#: 1 10*3/uL (ref 0.9–3.3)
LYMPH%: 6 % — ABNORMAL LOW (ref 14.0–49.7)
MCH: 29.6 pg (ref 25.1–34.0)
MCHC: 34 g/dL (ref 31.5–36.0)
MCV: 86.9 fL (ref 79.5–101.0)
MONO#: 0.8 10*3/uL (ref 0.1–0.9)
MONO%: 4.7 % (ref 0.0–14.0)
NEUT%: 89.1 % — ABNORMAL HIGH (ref 38.4–76.8)
NEUTROS ABS: 15.2 10*3/uL — AB (ref 1.5–6.5)
Platelets: 246 10*3/uL (ref 145–400)
RBC: 4.06 10*6/uL (ref 3.70–5.45)
RDW: 16.8 % — AB (ref 11.2–14.5)
WBC: 17.1 10*3/uL — ABNORMAL HIGH (ref 3.9–10.3)

## 2015-04-12 LAB — COMPREHENSIVE METABOLIC PANEL
ALT: 79 U/L — ABNORMAL HIGH (ref 0–55)
ANION GAP: 12 meq/L — AB (ref 3–11)
AST: 24 U/L (ref 5–34)
Albumin: 3.3 g/dL — ABNORMAL LOW (ref 3.5–5.0)
Alkaline Phosphatase: 80 U/L (ref 40–150)
BILIRUBIN TOTAL: 0.37 mg/dL (ref 0.20–1.20)
BUN: 23.6 mg/dL (ref 7.0–26.0)
CO2: 24 meq/L (ref 22–29)
Calcium: 8.7 mg/dL (ref 8.4–10.4)
Chloride: 102 mEq/L (ref 98–109)
Creatinine: 0.7 mg/dL (ref 0.6–1.1)
GLUCOSE: 104 mg/dL (ref 70–140)
POTASSIUM: 4.5 meq/L (ref 3.5–5.1)
SODIUM: 138 meq/L (ref 136–145)
Total Protein: 6.5 g/dL (ref 6.4–8.3)

## 2015-04-12 MED ORDER — FUROSEMIDE 20 MG PO TABS: 20.0000 mg | ORAL_TABLET | Freq: Every day | ORAL | Status: DC

## 2015-04-12 NOTE — Progress Notes (Signed)
Patient Care Team: [place] as PCP - [place], MD as Consulting Physician (Hematology and Oncology) Manus Gunning, MD as Consulting Physician (Gastroenterology)  DIAGNOSIS: Primary cancer of right upper lobe of lung [facility])   Staging form: Lung, AJCC 7th Edition     Clinical stage from 07/13/2014: Stage IV (T3, N2, M1b) - Signed by Curt Bears, MD on 07/15/2014   SUMMARY OF ONCOLOGIC HISTORY: Oncology History   Patient presented to ED with c/o HA, work up showed brain lesion.   Lung cancer   Staging form: Lung, AJCC 6th Edition     Clinical: Stage IV (T3, N2, M1) - Unsigned       Primary cancer of right upper lobe of lung ([place])   06/14/2014 Initial Diagnosis metastatic lung adenocarcinoma to brain    06/14/2014 Pathology Results RUL lung nodule biopsy showed adenocarcinoma, immunostaining positive for CK 7, TTF-1 and Naprosyn a. Negative for CK 20, CD X2, estrogen receptor, consistent with lung primary. EGFR negative    06/20/2014 Imaging Brain MRI with and without contrast showed solitary lesion in the left posterior temporal lobe measuring 12 x 14 mm, with peripheral ring enhancement and surrounding vasogenic edema.   06/23/2014 PET scan 2 hypermetabolic pulmonary nodules in the right upper lobe, hypermetabolic right peritracheal and subcarinal nodes, no other distant metastasis.   06/29/2014 - 06/29/2014 Radiation Therapy SRS to brain lesion    07/18/2014 - 08/28/2014 Chemotherapy Chemotherapy Carbo/Taxol weekly 7 concurrently with radiation   01/01/2015 Imaging right upper lobe nodule 7 mm, 2 additional nodules right upper lung, focal peripheral ill-defined opacities a new due to inflammation from radiation, mediastinal lymph nodes stable   01/12/2015 Imaging brain MRI: Slight interval regrowth of left inferior temporal lesion 10 x 9 mm was previously 9 x 8 mm, interval improvement of previous right parietal superficial lesion  previously 11 mm now 2 mm   01/17/2015 Procedure stereotactic radiosurgery to the 9 x 8 mm left cerebellar lesion   03/25/2015 - 04/01/2015 Hospital Admission Acute respiratory failure requiring intubation, acute encephalopathy , normocytic anemia    CHIEF COMPLIANT: follow-up after recent hospitalization  INTERVAL HISTORY: Destiny [name] is a 45 year old with above-mentioned history of stage IV lung cancer who was recently hospitalized with respiratory failure and encephalopathy. It was felt that some of her symptoms could have been related to tapering of steroids too quickly. During the hospitalization she had a CT of the chest which showed pulmonary infiltrates. It was unclear if it is lung cancer progression on if it was pneumonia. She was able to be extubated and was able to be discharged home. She is here today in my office for routine follow-up. She is doing remarkably better since she left the hospital. Her major complaint is lower extremity edema since she left the hospital. It appears to get better during the morning and then get worse through the rest of the day. She was using Lasix and she ran out of Lasix prescription.  REVIEW OF SYSTEMS:   Constitutional: Denies fevers, chills or abnormal weight loss, facial puffiness Eyes: Denies blurriness of vision Ears, nose, mouth, throat, and face: Denies mucositis or sore throat Respiratory: Denies cough, dyspnea or wheezes Cardiovascular: Denies palpitation, chest discomfort Gastrointestinal:  Denies nausea, heartburn or change in bowel habits Skin: Denies abnormal skin rashes Lymphatics: Denies new lymphadenopathy or easy bruising Neurological:Denies numbness, tingling or new weaknesses Behavioral/Psych: Mood is stable, no new changes  Extremities: significant lower extremity  edema  All other systems were reviewed with the patient and are negative.  I have reviewed the past medical history, past surgical history, social history and family  history with the patient and they are unchanged from previous note.  ALLERGIES:  has No Known Allergies.  MEDICATIONS:  Current Outpatient Prescriptions  Medication Sig Dispense Refill  . acetaminophen (TYLENOL) 500 MG tablet Take 1,000 mg by mouth every 6 (six) hours as needed.    [name] albuterol (PROVENTIL HFA;VENTOLIN HFA) 108 (90 Base) MCG/ACT inhaler Inhale 1-2 puffs into the lungs every 6 (six) hours as needed for wheezing or shortness of breath (ICD 10 J44.9). 1 Inhaler 3  . clonazePAM (KLONOPIN) 0.5 MG tablet Take 1 tablet (0.5 mg total) by mouth 2 (two) times daily as needed for anxiety. 30 tablet 0  . dexamethasone (DECADRON) 4 MG tablet Take 1 tablet (4 mg total) by mouth 4 (four) times daily. 161 tablet 2  . folic acid (FOLVITE) 1 MG tablet Take 1 tablet (1 mg total) by mouth daily. Start 5-7 days before Alimta chemotherapy. Continue until 21 days after Alimta completed. 100 tablet 3  . ibuprofen (ADVIL,MOTRIN) 200 MG tablet Take 400 mg by mouth every 6 (six) hours as needed for moderate pain. Reported on 04/06/2015    . ipratropium-albuterol (DUONEB) 0.5-2.5 (3) MG/3ML SOLN Take 3 mLs by nebulization 2 (two) times daily. 360 mL 5  . levETIRAcetam (KEPPRA) 500 MG tablet Take 1 tablet (500 mg total) by mouth 2 (two) times daily. 60 tablet 4  . metoprolol tartrate (LOPRESSOR) 25 MG tablet Take 1 tablet (25 mg total) by mouth 2 (two) times daily. 60 tablet 5  . mirtazapine (REMERON) 30 MG tablet Take 30 mg by mouth at bedtime as needed (depression, sleep). Reported on 04/06/2015  0  . pantoprazole (PROTONIX) 40 MG tablet Take 40 mg by mouth 2 (two) times daily before a meal. Reported on 03/22/2015    . polyethylene glycol (MIRALAX / GLYCOLAX) packet Take 17 g by mouth daily as needed for moderate constipation. 30 each 3  . furosemide (LASIX) 20 MG tablet Take 20 mg by mouth daily.  0   No current facility-administered medications for this visit.    PHYSICAL EXAMINATION: ECOG PERFORMANCE  STATUS: 1 - Symptomatic but completely ambulatory  Filed Vitals:   04/12/15 1353  BP: 140/68  Pulse: 79  Temp: 97.8 F (36.6 C)  Resp: 18   Filed Weights   04/12/15 1353  Weight: 223 lb 14.4 oz (101.56 kg)    GENERAL:alert, no distress and comfortable SKIN: skin color, texture, turgor are normal, no rashes or significant lesions EYES: normal, Conjunctiva are pink and non-injected, sclera clear OROPHARYNX:no exudate, no erythema and lips, buccal mucosa, and tongue normal  NECK: supple, thyroid normal size, non-tender, without nodularity LYMPH:  no palpable lymphadenopathy in the cervical, axillary or inguinal LUNGS: clear to auscultation and percussion with normal breathing effort HEART: regular rate & rhythm and no murmurs and no lower extremity edema ABDOMEN:abdomen soft, non-tender and normal bowel sounds MUSCULOSKELETAL:no cyanosis of digits and no clubbing  NEURO: alert & oriented x 3 with fluent speech, no focal motor/sensory deficits EXTREMITIES: 3+ lower extremity edema  LABORATORY DATA:  I have reviewed the data as listed   Chemistry      Component Value Date/Time   NA 138 04/06/2015 2331   NA 140 01/03/2015 1012   K 4.3 04/06/2015 2331   K 3.7 01/03/2015 1012   CL 101 04/06/2015 2331  CO2 26 03/31/2015 0638   CO2 21* 01/03/2015 1012   BUN 17 04/06/2015 2331   BUN 8.5 01/03/2015 1012   CREATININE 0.60 04/06/2015 2331   CREATININE 0.7 01/03/2015 1012      Component Value Date/Time   CALCIUM 9.4 03/31/2015 0638   CALCIUM 9.4 01/03/2015 1012   ALKPHOS 72 03/26/2015 0247   ALKPHOS 84 01/03/2015 1012   AST 72* 03/26/2015 0247   AST 15 01/03/2015 1012   ALT 55* 03/26/2015 0247   ALT 17 01/03/2015 1012   BILITOT 0.4 03/26/2015 0247   BILITOT <0.30 01/03/2015 1012       Lab Results  Component Value Date   WBC 17.1* 04/12/2015   HGB 12.0 04/12/2015   HCT 35.3 04/12/2015   MCV 86.9 04/12/2015   PLT 246 04/12/2015   NEUTROABS 15.2* 04/12/2015      ASSESSMENT & PLAN:  Primary cancer of right upper lobe of lung Stage IV (T3, N2, M1b) non-small cell lung cancer, adenocarcinoma with negative EGFR mutation and negative for ALK gene rearrangement diagnosed in April 2016  PRIOR THERAPY: Patient initially had Stereotactic radiotherapy to the solitary left posterior temporal brain lesion under the care of Dr. Tammi Klippel completed 07/03/2014. She was subsequently treated with concurrent chemoradiation with weekly carboplatin for AUC of 2 and paclitaxel 45 MG/M2. Status post 8 cycles completed 09/04/2014. Repeat CT chest on 10/06/2014 showed appropriate decrease in size of her right upper lobe lung nodules and mediastinal disease.status post SRS to cerebellar lesion 01/15/2015  Hospitalization 03/25/2015 to 04/01/2015 for acute respiratory failure, encephalopathy : recovered from this hospitalization.  Treatment plan: Previously I offered her systemic therapy with Alimta as a maintenance every 3 weeks but the patient elected to not received this treatment.  CT of the chest done during hospitalization on 03/23/2014 showed worsening consolidative opacities in the right upper lobe and right suprahilar region. This could be pneumonia versus progression of cancer. I recommended that we perform restaging scans to assess the status of her systemic disease.  Pain issues: palliative care has been managing her pain concerns.  return to clinic after the scans to review the results.   No orders of the defined types were placed in this encounter.   The patient has a good understanding of the overall plan. she agrees with it. she will call with any problems that may develop before the next visit here.   Rulon Eisenmenger, MD 04/12/2015 Yes.

## 2015-04-12 NOTE — Assessment & Plan Note (Signed)
Stage IV (T3, N2, M1b) non-small cell lung cancer, adenocarcinoma with negative EGFR mutation and negative for ALK gene rearrangement diagnosed in April 2016  PRIOR THERAPY: Patient initially had Stereotactic radiotherapy to the solitary left posterior temporal brain lesion under the care of Dr. Tammi Klippel completed 07/03/2014. She was subsequently treated with concurrent chemoradiation with weekly carboplatin for AUC of 2 and paclitaxel 45 MG/M2. Status post 8 cycles completed 09/04/2014. Repeat CT chest on 10/06/2014 showed appropriate decrease in size of her right upper lobe lung nodules and mediastinal disease.status post SRS to cerebellar lesion 01/15/2015  Hospitalization 03/25/2015 to 04/01/2015 for acute respiratory failure, encephalopathy : recovered from this hospitalization.  Treatment plan: Previously I offered her systemic therapy with Alimta as a maintenance every 3 weeks but the patient elected to not received this treatment.  CT of the chest done during hospitalization on 03/23/2014 showed worsening consolidative opacities in the right upper lobe and right suprahilar region. This could be pneumonia versus progression of cancer. I recommended that we perform restaging scans to assess the status of her systemic disease.  Pain issues: palliative care has been managing her pain concerns.  return to clinic after the scans to review the results.

## 2015-04-12 NOTE — Telephone Encounter (Signed)
Called and left a message with added appts

## 2015-04-12 NOTE — Telephone Encounter (Signed)
Patient's Destiny [name], left message requesting return call. Called back. No answer. Left message.

## 2015-04-16 ENCOUNTER — Telehealth: Payer: Self-pay | Admitting: Hematology and Oncology

## 2015-04-16 ENCOUNTER — Telehealth: Payer: Self-pay | Admitting: *Deleted

## 2015-04-16 NOTE — Telephone Encounter (Signed)
VM message received from patient seeking to clarify upcoming appts. TC back to patient and reviewed her appts for this week. Pt wrote down the information and voiced understanding.

## 2015-04-16 NOTE — Telephone Encounter (Signed)
Pt called confirming his labs/ov states she didn't get a call I advised there was a call on 02/02 confirming labs/ov and that pt will need to call Central Scheduling for her CT scan schedule and to come p/u up her contrast... KJ

## 2015-04-17 ENCOUNTER — Telehealth: Payer: Self-pay | Admitting: Radiation Oncology

## 2015-04-17 NOTE — Telephone Encounter (Signed)
Returned patient's call. Patient reports she has "white patches" on the inside of her mouth. Explained to the patient this is most likely thrush related to effects of decadron use. Patient reports she has Nystatin from where she had thrush in the past and plans to use this "to save money". Encouraged patient to call this RN back if symptoms become worse. Patient verbalized understanding. Spoke with patient and her step son,[name], via speaker phone to review all upcoming appointments. Lastly, patient reports she has the "sniffles" and won't to "just pick up something at the drug store." Offered evaluation in the radiation clinic for these symptoms but, patient refused. Encouraged patient to consult with pharmacist at drug store equipped with a list of her current medications before purchasing an over the counter medication. Patient verbalized understanding.

## 2015-04-18 ENCOUNTER — Other Ambulatory Visit: Payer: Self-pay | Admitting: *Deleted

## 2015-04-18 ENCOUNTER — Telehealth: Payer: Self-pay | Admitting: Hematology and Oncology

## 2015-04-18 ENCOUNTER — Telehealth: Payer: Self-pay | Admitting: *Deleted

## 2015-04-18 NOTE — Telephone Encounter (Signed)
Spoke with patient to confirm appointment date/time for lab/ov after CT scan per 2/8 pof

## 2015-04-18 NOTE — Telephone Encounter (Signed)
"  I'm concerned about my CT scan appointment being changed to April 26, 2015 because insurance has not decided to cover it yet.  i finished pneumonia medicine two weeks ago.  How do I know it's gone?"  Advised to go to ED if fever, cold chills with shakes, s.o.b, chest pain, fatigue and unable to eat.  All of these symptoms denied at this time.    Will notify provider as 04-20-2015 F/u may need to be affected by this change.

## 2015-04-18 NOTE — Telephone Encounter (Signed)
Sent pof to reschedule appts after CT done.

## 2015-04-19 ENCOUNTER — Ambulatory Visit (HOSPITAL_COMMUNITY): Payer: Medicaid Other

## 2015-04-20 ENCOUNTER — Ambulatory Visit: Payer: Medicaid Other | Admitting: Hematology and Oncology

## 2015-04-20 ENCOUNTER — Other Ambulatory Visit: Payer: Medicaid Other

## 2015-04-24 ENCOUNTER — Other Ambulatory Visit: Payer: Self-pay | Admitting: Radiation Oncology

## 2015-04-24 ENCOUNTER — Telehealth: Payer: Self-pay | Admitting: *Deleted

## 2015-04-24 DIAGNOSIS — R531 Weakness: Secondary | ICD-10-CM

## 2015-04-24 DIAGNOSIS — C7931 Secondary malignant neoplasm of brain: Secondary | ICD-10-CM

## 2015-04-24 DIAGNOSIS — C3411 Malignant neoplasm of upper lobe, right bronchus or lung: Secondary | ICD-10-CM

## 2015-04-24 NOTE — Telephone Encounter (Signed)
Called patient to inform of PT appt. For 04-27-15- arrival time - 7:50 am, spoke with patient and she is aware of this appt.

## 2015-04-25 NOTE — Assessment & Plan Note (Signed)
Stage IV (T3, N2, M1b) non-small cell lung cancer, adenocarcinoma with negative EGFR mutation and negative for ALK gene rearrangement diagnosed in April 2016  PRIOR THERAPY: Patient initially had Stereotactic radiotherapy to the solitary left posterior temporal brain lesion under the care of Dr. Manning completed 07/03/2014. She was subsequently treated with concurrent chemoradiation with weekly carboplatin for AUC of 2 and paclitaxel 45 MG/M2. Status post 8 cycles completed 09/04/2014. Repeat CT chest on 10/06/2014 showed appropriate decrease in size of her right upper lobe lung nodules and mediastinal disease.status post SRS to cerebellar lesion 01/15/2015  Hospitalization 03/25/2015 to 04/01/2015 for acute respiratory failure, encephalopathy : recovered from this hospitalization.  Treatment plan: Previously I offered her systemic therapy with Alimta as a maintenance every 3 weeks but the patient elected to not received this treatment.  CT of the chest done during hospitalization on 03/23/2014 showed worsening consolidative opacities in the right upper lobe and right suprahilar region. This could be pneumonia versus progression of cancer. I recommended that we perform restaging scans to assess the status of her systemic disease.  Pain issues: palliative care has been managing her pain concerns.  return to clinic after the scans to review the results. 

## 2015-04-26 ENCOUNTER — Encounter (HOSPITAL_COMMUNITY): Payer: Self-pay

## 2015-04-26 ENCOUNTER — Ambulatory Visit (HOSPITAL_COMMUNITY)
Admission: RE | Admit: 2015-04-26 | Discharge: 2015-04-26 | Disposition: A | Discharge status: Home / Self Care | Payer: Medicaid Other | Source: Ambulatory Visit | Attending: Hematology and Oncology | Admitting: Hematology and Oncology

## 2015-04-26 ENCOUNTER — Ambulatory Visit (HOSPITAL_BASED_OUTPATIENT_CLINIC_OR_DEPARTMENT_OTHER): Payer: Medicaid Other | Admitting: Hematology and Oncology

## 2015-04-26 ENCOUNTER — Encounter: Payer: Self-pay | Admitting: Hematology and Oncology

## 2015-04-26 VITALS — BP 162/88 | HR 87 | Temp 97.8°F | Resp 18 | Wt 221.2 lb

## 2015-04-26 DIAGNOSIS — C7931 Secondary malignant neoplasm of brain: Secondary | ICD-10-CM | POA: Diagnosis not present

## 2015-04-26 DIAGNOSIS — D259 Leiomyoma of uterus, unspecified: Secondary | ICD-10-CM | POA: Insufficient documentation

## 2015-04-26 DIAGNOSIS — I2510 Atherosclerotic heart disease of native coronary artery without angina pectoris: Secondary | ICD-10-CM | POA: Insufficient documentation

## 2015-04-26 DIAGNOSIS — Z923 Personal history of irradiation: Secondary | ICD-10-CM | POA: Insufficient documentation

## 2015-04-26 DIAGNOSIS — C3411 Malignant neoplasm of upper lobe, right bronchus or lung: Secondary | ICD-10-CM

## 2015-04-26 DIAGNOSIS — K760 Fatty (change of) liver, not elsewhere classified: Secondary | ICD-10-CM | POA: Insufficient documentation

## 2015-04-26 DIAGNOSIS — K229 Disease of esophagus, unspecified: Secondary | ICD-10-CM | POA: Insufficient documentation

## 2015-04-26 DIAGNOSIS — Z9221 Personal history of antineoplastic chemotherapy: Secondary | ICD-10-CM | POA: Diagnosis not present

## 2015-04-26 MED ORDER — IOHEXOL 300 MG/ML  SOLN
100.0000 mL | Freq: Once | INTRAMUSCULAR | Status: AC | PRN
Start: 2015-04-26 — End: 2015-04-26
  Administered 2015-04-26: 100 mL via INTRAVENOUS

## 2015-04-26 NOTE — Progress Notes (Signed)
Patient Care Team: [place] as PCP - Burton, MD as Consulting Physician (Hematology and Oncology) Manus Gunning, MD as Consulting Physician (Gastroenterology)  DIAGNOSIS: Primary cancer of right upper lobe of lung [facility])   Staging form: Lung, AJCC 7th Edition     Clinical stage from 07/13/2014: Stage IV (T3, N2, M1b) - Signed by Curt Bears, MD on 07/15/2014   SUMMARY OF ONCOLOGIC HISTORY: Oncology History   Patient presented to ED with c/o HA, work up showed brain lesion.   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 Negative for CK 20, CD X2, estrogen receptor, consistent with lung primary. EGFR negative    06/20/2014 Imaging Brain MRI with and without contrast showed solitary lesion in the left posterior temporal lobe measuring 12 x 14 mm, with peripheral ring enhancement and surrounding vasogenic edema.   06/23/2014 PET scan 2 hypermetabolic pulmonary nodules in the right upper lobe, hypermetabolic right peritracheal and subcarinal nodes, no other distant metastasis.   06/29/2014 - 06/29/2014 Radiation Therapy SRS to brain lesion    07/18/2014 - 08/28/2014 Chemotherapy Chemotherapy Carbo/Taxol weekly 7 concurrently with radiation   01/01/2015 Imaging right upper lobe nodule 7 mm, 2 additional nodules right upper lung, focal peripheral ill-defined opacities a new due to inflammation from radiation, mediastinal lymph nodes stable   01/12/2015 Imaging brain MRI: Slight interval regrowth of left inferior temporal lesion 10 x 9 mm was previously 9 x 8 mm, interval improvement of previous right parietal superficial lesion  previously 11 mm now 2 mm   01/17/2015 Procedure stereotactic radiosurgery to the 9 x 8 mm left cerebellar lesion   03/25/2015 - 04/01/2015 Hospital Admission Acute respiratory failure requiring intubation, acute encephalopathy , normocytic anemia    CHIEF COMPLIANT: Follow-up to discuss her medications  INTERVAL HISTORY: CEILI BOSHERS is a 35 over the above-mentioned history of metastatic lung cancer with brain metastases who is currently on observation. She is scheduled to undergo CT scans today. She came in for an urgent visit because she is concerned about fluid retention and puffiness of the face. She was also concerned about side effects of one of her anxiety medications which is decreasing her taste and appetite. It appears that she stopped her anxiety medication and her taste and appetite are better. She would also like to decrease the dexamethasone so that her facial puffiness could improve.  REVIEW OF SYSTEMS:   Constitutional: Denies fevers, chills or abnormal weight loss Eyes: Denies blurriness of vision Ears, nose, mouth, throat, and face: Denies mucositis or sore throat, facial puffiness Respiratory: Denies cough, dyspnea or wheezes Cardiovascular: Denies palpitation, chest discomfort Gastrointestinal:  Denies nausea, heartburn or change in bowel habits Skin: Denies abnormal skin rashes Lymphatics: Denies new lymphadenopathy or easy bruising Neurological:Denies numbness, tingling or new weaknesses Behavioral/Psych: Mood is stable, no new changes  Extremities: 1 + lower extremity edema  All other systems were reviewed with the patient and are negative.  I have reviewed the past medical history, past surgical history, social history and family history with the patient and they are unchanged from previous note.  ALLERGIES:  has No Known Allergies.  MEDICATIONS:  Current Outpatient Prescriptions  Medication Sig Dispense Refill  . acetaminophen (TYLENOL) 500 MG tablet Take  1,000 mg by mouth every 6 (six) hours as needed.    [name] albuterol (PROVENTIL HFA;VENTOLIN HFA) 108 (90 Base) MCG/ACT inhaler Inhale 1-2 puffs into the lungs every 6 (six) hours as needed for wheezing or shortness of breath (ICD 10 J44.9). 1 Inhaler 3  . clonazePAM (KLONOPIN) 0.5 MG tablet Take 1 tablet (0.5 mg total) by mouth 2 (two) times daily as needed for anxiety. 30 tablet 0  . dexamethasone (DECADRON) 4 MG tablet Take 1 tablet (4 mg total) by mouth 4 (four) times daily. 803 tablet 2  . folic acid (FOLVITE) 1 MG tablet Take 1 tablet (1 mg total) by mouth daily. Start 5-7 days before Alimta chemotherapy. Continue until 21 days after Alimta completed. 100 tablet 3  . furosemide (LASIX) 20 MG tablet Take 1 tablet (20 mg total) by mouth daily. 30 tablet 1  . ibuprofen (ADVIL,MOTRIN) 200 MG tablet Take 400 mg by mouth every 6 (six) hours as needed for moderate pain. Reported on 04/06/2015    . ipratropium-albuterol (DUONEB) 0.5-2.5 (3) MG/3ML SOLN Take 3 mLs by nebulization 2 (two) times daily. 360 mL 5  . levETIRAcetam (KEPPRA) 500 MG tablet Take 1 tablet (500 mg total) by mouth 2 (two) times daily. 60 tablet 4  . metoprolol tartrate (LOPRESSOR) 25 MG tablet Take 1 tablet (25 mg total) by mouth 2 (two) times daily. 60 tablet 5  . mirtazapine (REMERON) 30 MG tablet Take 30 mg by mouth at bedtime as needed (depression, sleep). Reported on 04/06/2015  0  . pantoprazole (PROTONIX) 40 MG tablet Take 40 mg by mouth 2 (two) times daily before a meal. Reported on 03/22/2015    . polyethylene glycol (MIRALAX / GLYCOLAX) packet Take 17 g by mouth daily as needed for moderate constipation. 30 each 3   No current facility-administered medications for this visit.    PHYSICAL EXAMINATION: ECOG PERFORMANCE STATUS: 1 - Symptomatic but completely ambulatory  Filed Vitals:   04/26/15 1313  BP: 162/88  Pulse: 87  Temp: 97.8 F (36.6 C)  Resp: 18   Filed Weights   04/26/15 1313  Weight: 221 lb 3.2 oz (100.336  kg)    GENERAL:alert, no distress and comfortable SKIN: skin color, texture, turgor are normal, no rashes or significant lesions EYES: normal, Conjunctiva are pink and non-injected, sclera clear OROPHARYNX:no exudate, no erythema and lips, buccal mucosa, and tongue normal  NECK: supple, thyroid normal size, non-tender, without nodularity LYMPH:  no palpable lymphadenopathy in the cervical, axillary or inguinal LUNGS: clear to auscultation and percussion with normal breathing effort HEART: regular rate & rhythm and no murmurs and no lower extremity edema ABDOMEN:abdomen soft, non-tender and normal bowel sounds MUSCULOSKELETAL:no cyanosis of digits and no clubbing  NEURO: alert & oriented x 3 with fluent speech, no focal motor/sensory deficits EXTREMITIES: No lower extremity edema  LABORATORY DATA:  I have reviewed the data as listed   Chemistry      Component Value Date/Time   NA 138 04/12/2015 1333   NA 138 04/06/2015 2331   K 4.5 04/12/2015 1333   K 4.3 04/06/2015 2331   CL 101 04/06/2015 2331   CO2 24 04/12/2015 1333   CO2 26 03/31/2015 0638   BUN 23.6 04/12/2015 1333   BUN 17 04/06/2015 2331   CREATININE 0.7 04/12/2015 1333   CREATININE 0.60 04/06/2015 2331  Component Value Date/Time   CALCIUM 8.7 04/12/2015 1333   CALCIUM 9.4 03/31/2015 0638   ALKPHOS 80 04/12/2015 1333   ALKPHOS 72 03/26/2015 0247   AST 24 04/12/2015 1333   AST 72* 03/26/2015 0247   ALT 79* 04/12/2015 1333   ALT 55* 03/26/2015 0247   BILITOT 0.37 04/12/2015 1333   BILITOT 0.4 03/26/2015 0247       Lab Results  Component Value Date   WBC 17.1* 04/12/2015   HGB 12.0 04/12/2015   HCT 35.3 04/12/2015   MCV 86.9 04/12/2015   PLT 246 04/12/2015   NEUTROABS 15.2* 04/12/2015   ASSESSMENT & PLAN:  Primary cancer of right upper lobe of lung Stage IV (T3, N2, M1b) non-small cell lung cancer, adenocarcinoma with negative EGFR mutation and negative for ALK gene rearrangement diagnosed in April  2016  PRIOR THERAPY: Patient initially had Stereotactic radiotherapy to the solitary left posterior temporal brain lesion under the care of Dr. Tammi Klippel completed 07/03/2014. She was subsequently treated with concurrent chemoradiation with weekly carboplatin for AUC of 2 and paclitaxel 45 MG/M2. Status post 8 cycles completed 09/04/2014. Repeat CT chest on 10/06/2014 showed appropriate decrease in size of her right upper lobe lung nodules and mediastinal disease.status post SRS to cerebellar lesion 01/15/2015  Hospitalization 03/25/2015 to 04/01/2015 for acute respiratory failure, encephalopathy : recovered from this hospitalization.  Treatment plan: Previously I offered her systemic therapy with Alimta as a maintenance every 3 weeks but the patient elected to not received this treatment.  CT of the chest done during hospitalization on 03/23/2014 showed worsening consolidative opacities in the right upper lobe and right suprahilar region. This could be pneumonia versus progression of cancer. Patient is scheduled to undergo another CT scan of the chest today.  Pain issues: palliative care has been managing her pain concerns. I decreased the dosing of dexamethasone to 4 mg by mouth 3 times a day  I will call her with the result of the CT scan. Depending on the results he may need to see her sooner or may continue to do watchful observation with a scan again in 3 months.  No orders of the defined types were placed in this encounter.   The patient has a good understanding of the overall plan. she agrees with it. she will call with any problems that may develop before the next visit here.   Rulon Eisenmenger, MD 04/26/2015

## 2015-04-26 NOTE — Addendum Note (Signed)
Addended by: Prentiss Bells on: 04/26/2015 04:34 PM   Modules accepted: Orders, Medications

## 2015-04-27 ENCOUNTER — Ambulatory Visit
Admission: RE | Admit: 2015-04-27 | Discharge: 2015-04-27 | Disposition: A | Discharge status: Home / Self Care | Payer: Medicaid Other | Source: Ambulatory Visit | Attending: Radiation Oncology | Admitting: Radiation Oncology

## 2015-04-27 ENCOUNTER — Encounter: Payer: Self-pay | Admitting: Radiation Oncology

## 2015-04-27 ENCOUNTER — Telehealth: Payer: Self-pay | Admitting: *Deleted

## 2015-04-27 ENCOUNTER — Ambulatory Visit: Payer: Medicaid Other | Admitting: Physical Therapy

## 2015-04-27 DIAGNOSIS — C7949 Secondary malignant neoplasm of other parts of nervous system: Principal | ICD-10-CM

## 2015-04-27 DIAGNOSIS — C7931 Secondary malignant neoplasm of brain: Secondary | ICD-10-CM

## 2015-04-27 MED ORDER — GADOBENATE DIMEGLUMINE 529 MG/ML IV SOLN
20.0000 mL | Freq: Once | INTRAVENOUS | Status: AC | PRN
  Administered 2015-04-27: 20 mL via INTRAVENOUS

## 2015-04-27 NOTE — Progress Notes (Signed)
Paperwork (housing) received through fax, given to nurse, 04/27/15 Destiny [name])

## 2015-04-27 NOTE — Telephone Encounter (Signed)
Returned call to pt.  Pt reports Dr. [name] reduced her steroid to TID.  She wants to know MRI results so she knows how much steroid to take.  Advised pt her results did not indicate a need to change the dose from what Dr. [name] told her to take yesterday.  Pt said she does not understand.  Read MRI results to patient and offered to mail her the results.  Call dropped on patient end before she could respond.

## 2015-04-27 NOTE — Telephone Encounter (Signed)
"  I've been waiting since yesterday for a call with scan results and instructions about what medicines to take.  I took the steroid because I do not want to have another episode and end up on life support.  Please call me today [phone]) to let me know what I am to take."

## 2015-04-30 ENCOUNTER — Other Ambulatory Visit: Payer: Self-pay | Admitting: Hematology and Oncology

## 2015-04-30 ENCOUNTER — Telehealth: Payer: Self-pay | Admitting: Radiation Oncology

## 2015-04-30 ENCOUNTER — Telehealth: Payer: Self-pay

## 2015-04-30 MED ORDER — SUCRALFATE 1 GM/10ML PO SUSP: 1.0000 g | Freq: Three times a day (TID) | ORAL | Status: AC

## 2015-04-30 MED ORDER — DEXAMETHASONE 4 MG PO TABS: 4.0000 mg | ORAL_TABLET | Freq: Three times a day (TID) | ORAL | Status: AC

## 2015-04-30 NOTE — Telephone Encounter (Signed)
Pt is also asking if it ok to stay by herself. She lives alone.

## 2015-04-30 NOTE — Telephone Encounter (Signed)
Pt called for direction on taking her medications. Her son fixes her pill box. Her son is at [facility] right now so he cannot clarify for her. She looked in her pill box to figure this out. She thinks she takes steroid and lasix in AM. She does take steroid and protonix and a little white one mid day. She takes seizure pill and a white one in evening. She takes protonix and steroid at bed time. . She is asking for something for gas because the protonix is not helping. She is asking if there is anything to help heal her esophagus. She states advil helps but she is taking it about every 4 hours. She stated she told palliative care she did not want there services any longer.

## 2015-04-30 NOTE — Telephone Encounter (Signed)
Dr. [name] spoke with the patient by phone, answered all her questions per below as well as scan results.  As a result of phone conversation, Dr. [name] does not need to see the patient for 3 months.    POF entered.

## 2015-04-30 NOTE — Telephone Encounter (Signed)
[place] folder with complete [employer] paperwork placed in [place] [facility]'s box to sign

## 2015-05-01 ENCOUNTER — Telehealth: Payer: Self-pay | Admitting: *Deleted

## 2015-05-01 NOTE — Telephone Encounter (Signed)
VM message received from patient's son-in-law, [name] with questions regarding [place]'s pain medications. TC to [name], though patient herself answered the phone. Nara was inquiring about how often she can take her Ibuprofen. "My liver is swollen and I don't want to hurt it more". Reviewed pt's med list with patient. She can have '400mg'$  Ibuprofen every 6 hours as needed for pain. Pt able to repeat back to this writer the instructions. No other issues verbalized.

## 2015-05-02 ENCOUNTER — Encounter: Payer: Self-pay | Admitting: Radiation Oncology

## 2015-05-02 ENCOUNTER — Ambulatory Visit: Payer: Self-pay | Admitting: Radiation Oncology

## 2015-05-02 ENCOUNTER — Other Ambulatory Visit: Payer: Medicaid Other

## 2015-05-02 ENCOUNTER — Ambulatory Visit: Payer: Medicaid Other | Admitting: Hematology and Oncology

## 2015-05-02 ENCOUNTER — Telehealth: Payer: Self-pay | Admitting: Hematology and Oncology

## 2015-05-02 NOTE — Progress Notes (Signed)
Paperwork received from doctor, faxed to [facility] @ [phone], confirmation received, pt advised will get copy at next appt. 05/02/15 Ardeen Fillers)

## 2015-05-02 NOTE — Telephone Encounter (Signed)
Spoke with patient re lab/fu 5/22.

## 2015-05-03 ENCOUNTER — Other Ambulatory Visit: Payer: Self-pay

## 2015-05-03 ENCOUNTER — Ambulatory Visit: Payer: Medicaid Other | Admitting: Radiation Oncology

## 2015-05-03 ENCOUNTER — Ambulatory Visit
Admission: RE | Admit: 2015-05-03 | Discharge: 2015-05-03 | Disposition: A | Discharge status: Home / Self Care | Payer: Medicaid Other | Source: Ambulatory Visit | Attending: Radiation Oncology | Admitting: Radiation Oncology

## 2015-05-03 DIAGNOSIS — C7931 Secondary malignant neoplasm of brain: Secondary | ICD-10-CM

## 2015-05-03 DIAGNOSIS — C3411 Malignant neoplasm of upper lobe, right bronchus or lung: Secondary | ICD-10-CM

## 2015-05-04 ENCOUNTER — Telehealth: Payer: Self-pay

## 2015-05-04 ENCOUNTER — Telehealth: Payer: Self-pay | Admitting: Radiation Oncology

## 2015-05-04 ENCOUNTER — Telehealth: Payer: Self-pay | Admitting: *Deleted

## 2015-05-04 DIAGNOSIS — C3411 Malignant neoplasm of upper lobe, right bronchus or lung: Secondary | ICD-10-CM

## 2015-05-04 DIAGNOSIS — K209 Esophagitis, unspecified without bleeding: Secondary | ICD-10-CM

## 2015-05-04 DIAGNOSIS — C7931 Secondary malignant neoplasm of brain: Secondary | ICD-10-CM

## 2015-05-04 DIAGNOSIS — K2100 Gastro-esophageal reflux disease with esophagitis, without bleeding: Secondary | ICD-10-CM

## 2015-05-04 NOTE — Telephone Encounter (Signed)
[place] Per Dr. [name], place referral to GI.   This RN asked Elizebeth Koller, and she agreed to contact the patient and let her know a GI consult would be placed.

## 2015-05-04 NOTE — Telephone Encounter (Signed)
Patient's son called and would like a call back reguesting Dr. [name] to schedule an "endoscopy" for patient's esophagitis pain.

## 2015-05-04 NOTE — Telephone Encounter (Signed)
Destiny [name] called for his stepmother.  Reports a call waas dropped.  He is again requesting an endoscopy, reports she has a fatty liver and pt wants to know what caused it.

## 2015-05-04 NOTE — Telephone Encounter (Deleted)
Destiny [name]

## 2015-05-04 NOTE — Telephone Encounter (Signed)
Destiny [name] son, Destiny [name], called triage with a request that Dr. [name] schedule an endoscopy for his mother's esophagitis pain.  A GI consult was ordered.  I call patient back and spoke to [facility] and informed him that the consult had been placed and that Ms. Copus will be receiving a call to schedule the appointment.  He verbalized understanding and denied any other questions or concerns at this time.

## 2015-05-04 NOTE — Telephone Encounter (Signed)
Patient did not show for follow up appointment. Phoned to inquire. Patient states, "Well I didn't think I needed too since Dr. [name] gave me my test results. She requested this RN inform Dr. Tammi Klippel that Dr. [name] reduced her decadron to 4 mg by mouth three times per day. Patient understands Mont Dutton, [facility] navigator, will be contact with her about her next appointment date and time.

## 2015-05-04 NOTE — Telephone Encounter (Signed)
Destiny [name] called for his stepmother.  He is requesting an endoscopy for her.

## 2015-05-04 NOTE — Addendum Note (Signed)
Addended by: Prentiss Bells on: 05/04/2015 12:26 PM   Modules accepted: Orders

## 2015-05-08 ENCOUNTER — Telehealth: Payer: Self-pay

## 2015-05-08 ENCOUNTER — Other Ambulatory Visit: Payer: Self-pay | Admitting: *Deleted

## 2015-05-08 NOTE — Telephone Encounter (Signed)
Patient's son Destiny [name] called wondering why no one called he or his mother back on Monday regarding the endoscopy.  There is a documented note from Destiny Koller, RN that she spoke with Destiny [name] stating that an referral was place for the test.  Destiny [name] denies ever speaking with anyone and wants to know if he takes his mother to the ER if she could have her EGD done then there.  Writer encouraged them both to wait for someone to call them from the GI dept.  Patient was on the phone at this point and she started talking about taking pain medication and how everyone thinks she's addicted to the pain meds.  Then patient asked about going off the anti seizure medication.  Writer informed her that she has an appt with Dr. [name] in May at which time she could discuss it then.  Writer encouraged her to continue to take all her medications as she is taking them now. Patient agreed to wait another day for the GI dept to schedule her EGD but will take other actions if she does not hear from them.

## 2015-05-08 NOTE — Telephone Encounter (Signed)
Ref noted for gastro md and [facility] will call the patient with a new patient appt

## 2015-05-09 ENCOUNTER — Telehealth: Payer: Self-pay | Admitting: Radiation Oncology

## 2015-05-09 ENCOUNTER — Other Ambulatory Visit: Payer: Self-pay | Admitting: Radiation Oncology

## 2015-05-09 NOTE — Telephone Encounter (Signed)
Returned message left by patient. Patient questions if decadron is causing her hands to cramp intermittently. Discussed that decadron can cause muscle cramping. Inquired about her diet. Patient reports she has been eating a lot of junk food like potato chips. Encouraged patient to avoid salty junk food, consume foods high in potassium and increase fluid intake to include water, powerade, gatorade, etc. Explained her eating habits are likely contributing to her muscle cramping. Patient verbalized understanding. Patient goes onto say she plan to move into her ground level apartment this weekend.

## 2015-05-10 ENCOUNTER — Telehealth: Payer: Self-pay | Admitting: *Deleted

## 2015-05-10 NOTE — Telephone Encounter (Signed)
"  My Step-mom told me to call and provide you all with her new Mobile number.  [phone] is her new number.  This nurse changed number in demographics.

## 2015-05-11 ENCOUNTER — Other Ambulatory Visit: Payer: Self-pay | Admitting: *Deleted

## 2015-05-11 ENCOUNTER — Telehealth: Payer: Self-pay | Admitting: *Deleted

## 2015-05-11 DIAGNOSIS — C3411 Malignant neoplasm of upper lobe, right bronchus or lung: Secondary | ICD-10-CM

## 2015-05-11 MED ORDER — NYSTATIN 100000 UNIT/ML MT SUSP
OROMUCOSAL | Status: AC
Start: 2015-05-11 — End: ?

## 2015-05-11 NOTE — Telephone Encounter (Signed)
PATIENT CALLED ASKING STATUS OF REFERRAL FOR PROCEDURE.    "I hurt in my esophagus and tired of taking a pain pill every four hours.  There is a procedure I was told I need and I want to get fixed.  Discharged from hospital a month ago and have not heard from anyone about an appointment."   "I also have thrush from the medicines take.  Who can help with this discomfort."  Son looked in her mouth and reports white spots.  Uses [facility] on [address].  Call her at [phone] or son Destiny [name] at [phone].    Will notify Dr. [name].  Advised Referral was made a week ago for [facility] and she will hear from this office when appointment scheduled.  She will call GI to inquire.

## 2015-05-11 NOTE — Telephone Encounter (Signed)
Sent prescription for Nystatin to patient's pharmacy and advised her of this. She reports that she has an appt at [facility] on Monday.

## 2015-05-14 ENCOUNTER — Ambulatory Visit (INDEPENDENT_AMBULATORY_CARE_PROVIDER_SITE_OTHER): Payer: Medicaid Other | Admitting: Gastroenterology

## 2015-05-14 ENCOUNTER — Ambulatory Visit: Payer: Self-pay | Admitting: Radiation Oncology

## 2015-05-14 ENCOUNTER — Telehealth: Payer: Self-pay | Admitting: Radiation Oncology

## 2015-05-14 ENCOUNTER — Encounter: Payer: Self-pay | Admitting: Gastroenterology

## 2015-05-14 VITALS — BP 126/70 | HR 84 | Ht 68.0 in | Wt 228.0 lb

## 2015-05-14 DIAGNOSIS — R1310 Dysphagia, unspecified: Secondary | ICD-10-CM

## 2015-05-14 MED ORDER — FLUCONAZOLE 100 MG PO TABS: 100.0000 mg | ORAL_TABLET | Freq: Every day | ORAL | Status: AC

## 2015-05-14 NOTE — Telephone Encounter (Signed)
Received call from [name], patient's stepson. On behalf of the patient he is requesting her decadron be tapered since she is taking 4 mg tid. Dr. [name] tapered her down to tid on 04/26/2015. Explained this RN will call back after discussing their request with Dr. Tammi Klippel.

## 2015-05-14 NOTE — Telephone Encounter (Signed)
I think we can go to 4 BID.

## 2015-05-14 NOTE — Progress Notes (Signed)
Review of pertinent gastrointestinal problems: 1.  Dysphagia, mid esophageal ulcer felt likely from radiation. EGD October 2016 Dr. Ardis Hughs  There was a mid esophageal ulcer surrounded by slightly inflamed appearing mucosa. The ulcer was 1cm across, clean based and did not cause stricture. The base and edge of the ulcer was biopsied and sent to pathlogy. There was LA Grade A GE junction esophagitis and a focal peptic stricture. The stricture was dilated with a CRE TTS 26m balloon. The examination was otherwise normal. Biopsies showed no sign of viral etiology. 2. Metastatic lung cancer: to brain  HPI: This is a   very pleasant 45year old woman with metastatic lung cancer  Chief complaint is dysphagia, odynophagia  Was in hospital, on ventilator for a week or so.  After this episode she had  She has dysphagia with solid pieces, will have to drink to get it down. Feels sore.  Feels very gassy at times.  She felt very good after EGD dilation 12/2014.   She has thrush and was called in nystatin swish and swallow recently but she did not pick it up yet     Past Medical History  Diagnosis Date  . Thyroid nodule   . Depression   . [place]   . SVT (supraventricular tachycardia) ([place]     a. Long RP tachycardia;  b. 01/2014 s/p RFCA.  . Metastatic lung cancer (metastasis from lung to other site) ([place] 06/2014    Stage IV (T3, N2, M1b) non-small cell lung cancer, adenocarcinoma; treated with chemo radiation (radiation completed 09/04/14).  Brain met treated with radiation 06/2014.   . Brain metastases ([place]     left brain met treated with radiation 06/2014. MRI 7/27 with new right brain met and shrunken left met.   . Hypertension     Past Surgical History  Procedure Laterality Date  . Thyroid surgery      Removed thyroid nodule, states still has thyroid; 2010  . Supraventricular tachycardia ablation N/A 01/16/2014    Procedure: SUPRAVENTRICULAR TACHYCARDIA ABLATION;   Surgeon: GEvans Lance MD;  [facility];  Service: Cardiovascular;  Laterality: N/A;  . Tee without cardioversion N/A 06/12/2014    Procedure: TRANSESOPHAGEAL ECHOCARDIOGRAM (TEE);  Surgeon: TSueanne Margarita MD;  [facility];  Service: Cardiovascular;  Laterality: N/A;    Current Outpatient Prescriptions  Medication Sig Dispense Refill  . clonazePAM (KLONOPIN) 0.5 MG tablet take 1 tablet by mouth twice a day if needed for anxiety  0  . dexamethasone (DECADRON) 4 MG tablet Take 1 tablet (4 mg total) by mouth 3 (three) times daily. 1382tablet 2  . folic acid (FOLVITE) 1 MG tablet take 1 tablet by mouth once daily START 5-7 DAYS BEFORE ALIMTA CH...  (REFER TO PRESCRIPTION NOTES).  0  . furosemide (LASIX) 20 MG tablet Take 1 tablet (20 mg total) by mouth daily. 30 tablet 1  . ibuprofen (ADVIL,MOTRIN) 200 MG tablet Take 400 mg by mouth every 6 (six) hours as needed for moderate pain. Reported on 04/06/2015    . ipratropium-albuterol (DUONEB) 0.5-2.5 (3) MG/3ML SOLN inhale contents of 1 vial twice a day in nebulizer  0  . levETIRAcetam (KEPPRA) 500 MG tablet Take 1 tablet (500 mg total) by mouth 2 (two) times daily. 60 tablet 4  . metoprolol tartrate (LOPRESSOR) 25 MG tablet Take 1 tablet (25 mg total) by mouth 2 (two) times daily. 60 tablet 5  . nystatin (MYCOSTATIN) 100000 UNIT/ML suspension 5 ml swish and  spit BID 120 mL 0  . pantoprazole (PROTONIX) 40 MG tablet   1  . polyethylene glycol (MIRALAX / GLYCOLAX) packet take 17 grams once daily if needed for MODERATE CONSTIPATION  0  . PROAIR HFA 108 (90 Base) MCG/ACT inhaler   0  . sucralfate (CARAFATE) 1 GM/10ML suspension Take 10 mLs (1 g total) by mouth 4 (four) times daily -  with meals and at bedtime. 420 mL 0   No current facility-administered medications for this visit.    Allergies as of 05/14/2015  . (No Known Allergies)    Family History  Problem Relation Age of Onset  . Seizures Mother   . Colon cancer Father    . Hypertension Father   . Diabetes Maternal Grandmother   . Diabetes Sister   . Kidney disease Neg Hx   . Liver disease Neg Hx   . Stomach cancer Neg Hx   . Esophageal cancer Neg Hx   . Thyroid cancer Sister     Social History   Social History  . Marital Status: Married    Spouse Name: N/A  . Number of Children: 4  . Years of Education: N/A   Occupational History  . Not on file.   Social History Main Topics  . Smoking status: Former Smoker -- 1.00 packs/day for 30 years    Types: Cigarettes    Quit date: 03/25/2015  . Smokeless tobacco: Former Systems developer     Comment: form given 12/29/14, vapor  . 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 Drug Use: No  . Sexual Activity: Yes   Other Topics Concern  . Not on file   Social History Narrative     Physical Exam: Wt 228 lb (103.42 kg)  LMP 04/15/2015 Constitutional: generally well-appearing Cushingoid face Obvious thrush in her oropharynx Psychiatric: alert and oriented x3 Abdomen: soft, nontender, nondistended, no obvious ascites, no peritoneal signs, normal bowel sounds   Assessment and plan: 45 y.o. female with dysphasia, thrush  I am suspicious that she has Candida esophagitis given the oral thrush. Nystatin swish and swallow would not help Candida esophagitis and so I called her in a prescription for Diflucan which she will take starting today. 200 mg today followed by 100 mg for the next 9 days. She knows to call here in 2-3 weeks to report on her response. She has also been feeling quite gassy and I recommended Gas-X up to 3 times a day for that   Owens Loffler, MD [facility] 05/14/2015, 1:43 PM

## 2015-05-14 NOTE — Patient Instructions (Addendum)
Trial of diflucan for thrush, dysphagia.  10 day course.  Take 2 pills today and then one pill a day for 9 more days. Call in 2-3 weeks to report on your response. OK to take gas ex pill three times a day.

## 2015-05-15 ENCOUNTER — Telehealth: Payer: Self-pay | Admitting: Radiation Oncology

## 2015-05-15 NOTE — Telephone Encounter (Signed)
Per Dr. Johny Shears order phoned patient instructed her to taper her decadron from 4 mg tid to bid. Patient verbalized understanding.

## 2015-05-16 ENCOUNTER — Other Ambulatory Visit: Payer: Self-pay

## 2015-05-16 DIAGNOSIS — C3411 Malignant neoplasm of upper lobe, right bronchus or lung: Secondary | ICD-10-CM

## 2015-05-16 MED ORDER — IPRATROPIUM-ALBUTEROL 0.5-2.5 (3) MG/3ML IN SOLN: 3.0000 mL | Freq: Four times a day (QID) | RESPIRATORY_TRACT | Status: AC | PRN

## 2015-05-16 NOTE — Telephone Encounter (Signed)
Spoke with Destiny [name] and told him that the Done  was refilled.

## 2015-05-20 ENCOUNTER — Emergency Department (HOSPITAL_COMMUNITY): Payer: Medicaid Other

## 2015-05-20 ENCOUNTER — Encounter (HOSPITAL_COMMUNITY): Payer: Self-pay | Admitting: Family Medicine

## 2015-05-20 ENCOUNTER — Emergency Department (HOSPITAL_COMMUNITY)
Admission: EM | Admit: 2015-05-20 | Discharge: 2015-05-20 | Disposition: A | Discharge status: Home / Self Care | Payer: Medicaid Other | Attending: Emergency Medicine | Admitting: Emergency Medicine

## 2015-05-20 DIAGNOSIS — F329 Major depressive disorder, single episode, unspecified: Secondary | ICD-10-CM | POA: Insufficient documentation

## 2015-05-20 DIAGNOSIS — I10 Essential (primary) hypertension: Secondary | ICD-10-CM | POA: Diagnosis not present

## 2015-05-20 DIAGNOSIS — Z79899 Other long term (current) drug therapy: Secondary | ICD-10-CM | POA: Insufficient documentation

## 2015-05-20 DIAGNOSIS — Z7951 Long term (current) use of inhaled steroids: Secondary | ICD-10-CM | POA: Diagnosis not present

## 2015-05-20 DIAGNOSIS — Z87891 Personal history of nicotine dependence: Secondary | ICD-10-CM | POA: Insufficient documentation

## 2015-05-20 DIAGNOSIS — C7800 Secondary malignant neoplasm of unspecified lung: Secondary | ICD-10-CM | POA: Diagnosis not present

## 2015-05-20 DIAGNOSIS — R05 Cough: Secondary | ICD-10-CM | POA: Insufficient documentation

## 2015-05-20 DIAGNOSIS — R0782 Intercostal pain: Secondary | ICD-10-CM | POA: Insufficient documentation

## 2015-05-20 DIAGNOSIS — R079 Chest pain, unspecified: Secondary | ICD-10-CM | POA: Diagnosis present

## 2015-05-20 LAB — HEPATIC FUNCTION PANEL
ALBUMIN: 2.7 g/dL — AB (ref 3.5–5.0)
ALK PHOS: 73 U/L (ref 38–126)
ALT: 72 U/L — AB (ref 14–54)
AST: 25 U/L (ref 15–41)
Bilirubin, Direct: 0.1 mg/dL (ref 0.1–0.5)
Indirect Bilirubin: 0.1 mg/dL — ABNORMAL LOW (ref 0.3–0.9)
TOTAL PROTEIN: 6.2 g/dL — AB (ref 6.5–8.1)
Total Bilirubin: 0.2 mg/dL — ABNORMAL LOW (ref 0.3–1.2)

## 2015-05-20 LAB — BASIC METABOLIC PANEL
Anion gap: 12 (ref 5–15)
BUN: 9 mg/dL (ref 6–20)
CHLORIDE: 101 mmol/L (ref 101–111)
CO2: 26 mmol/L (ref 22–32)
Calcium: 9.5 mg/dL (ref 8.9–10.3)
Creatinine, Ser: 0.57 mg/dL (ref 0.44–1.00)
GFR calc non Af Amer: >60 mL/min (ref >60)
Glucose, Bld: 136 mg/dL — ABNORMAL HIGH (ref 65–99)
POTASSIUM: 3.7 mmol/L (ref 3.5–5.1)
SODIUM: 139 mmol/L (ref 135–145)

## 2015-05-20 LAB — CBC
HEMATOCRIT: 33.4 % — AB (ref 36.0–46.0)
HEMOGLOBIN: 11.1 g/dL — AB (ref 12.0–15.0)
MCH: 28.7 pg (ref 26.0–34.0)
MCHC: 33.2 g/dL (ref 30.0–36.0)
MCV: 86.3 fL (ref 78.0–100.0)
Platelets: 214 10*3/uL (ref 150–400)
RBC: 3.87 MIL/uL (ref 3.87–5.11)
RDW: 16.6 % — ABNORMAL HIGH (ref 11.5–15.5)
WBC: 12.5 10*3/uL — AB (ref 4.0–10.5)

## 2015-05-20 LAB — I-STAT TROPONIN, ED: Troponin i, poc: 0 ng/mL (ref 0.00–0.08)

## 2015-05-20 MED ORDER — GI COCKTAIL ~~LOC~~
30.0000 mL | Freq: Once | ORAL | Status: AC
  Administered 2015-05-20: 30 mL via ORAL
  Filled 2015-05-20: qty 30

## 2015-05-20 NOTE — ED Notes (Signed)
Pt is in stable condition upon d/c and is escorted from ED via wheelchair. 

## 2015-05-20 NOTE — Discharge Instructions (Signed)
Avoid ibuprofen if he can. It is safe to take Tylenol as directed for pain, which is gentler on your stomach. Take Prilosec OTC as directed. He can also take Maalox 2 tablespoons after meals and at bedtime. We spoke with Dr.Shadad will arrange for you to have a CT scan of your chest before your next visit to the cancer specialist in 3 months. If you don't hear from the oncology office in a few days call and tell staff that Dr. Winfred Leeds spoke with Dr.Shadad about your case. Return if you feel worse for any reason.

## 2015-05-20 NOTE — ED Provider Notes (Addendum)
CSN: [id number]     Arrival date & time 05/20/15  0900 History   First MD Initiated Contact with Patient 05/20/15 [phone]     Chief Complaint  Patient presents with  . Chest Pain  . Cough     (Consider location/radiation/quality/duration/timing/severity/associated sxs/prior Treatment) HPI Patient presents with substernal chest pain nonradiating constant for 2 months, made Worse with eating. Not improved by anything Nonexertional. She denies any cough denies fever she's been treating herself with Motrin every 4 hours, with transient relief. No other associated symptoms Past Medical History  Diagnosis Date  . Thyroid nodule   . Depression   . Suicide attempt ([place])   . SVT (supraventricular tachycardia) ([place])     a. Long RP tachycardia;  b. 01/2014 s/p RFCA.  . Metastatic lung cancer (metastasis from lung to other site) ([place]) 06/2014    Stage IV (T3, N2, M1b) non-small cell lung cancer, adenocarcinoma; treated with chemo radiation (radiation completed 09/04/14).  Brain met treated with radiation 06/2014.   . Brain metastases ([place])     left brain met treated with radiation 06/2014. MRI 7/27 with new right brain met and shrunken left met.   . Hypertension    Past Surgical History  Procedure Laterality Date  . Thyroid surgery      Removed thyroid nodule, states still has thyroid; 2010  . Supraventricular tachycardia ablation N/A 01/16/2014    Procedure: SUPRAVENTRICULAR TACHYCARDIA ABLATION;  Surgeon: Evans Lance, MD;  Location: [facility] CATH LAB;  Service: Cardiovascular;  Laterality: N/A;  . Tee without cardioversion N/A 06/12/2014    Procedure: TRANSESOPHAGEAL ECHOCARDIOGRAM (TEE);  Surgeon: Sueanne Margarita, MD;  Location: [facility] ENDOSCOPY;  Service: Cardiovascular;  Laterality: N/A;   Family History  Problem Relation Age of Onset  . Seizures Mother   . Colon cancer Father   . Hypertension Father   . Diabetes Maternal Grandmother   . Diabetes Sister   . Kidney disease Neg Hx   . Liver disease  Neg Hx   . Stomach cancer Neg Hx   . Esophageal cancer Neg Hx   . Thyroid cancer Sister    Social History  Substance Use Topics  . Smoking status: Former Smoker -- 1.00 packs/day for 30 years    Types: Cigarettes    Quit date: 03/25/2015  . Smokeless tobacco: Former Systems developer     Comment: form given 12/29/14, vapor  . 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   HENT: Negative.   Respiratory: Negative.   Cardiovascular: Positive for chest pain.  Gastrointestinal: Negative.   Musculoskeletal: Negative.   Skin: Negative.   Allergic/Immunologic: Positive for immunocompromised state.       Metastatic lung cancer  Neurological: Negative.   Psychiatric/Behavioral: Negative.   All other systems reviewed and are negative.     Allergies  Review of patient's allergies indicates no known allergies.  Home Medications   Prior to Admission medications   Medication Sig Start Date End Date Taking? Authorizing Provider  clonazePAM (KLONOPIN) 0.5 MG tablet take 1 tablet by mouth twice a day if needed for anxiety 04/01/15   Historical Provider, MD  dexamethasone (DECADRON) 4 MG tablet Take 1 tablet (4 mg total) by mouth 3 (three) times daily. 04/30/15   Nicholas Lose, MD  fluconazole (DIFLUCAN) 100 MG tablet Take 1 tablet (100 mg  total) by mouth daily. 05/14/15   Milus Banister, MD  folic acid (FOLVITE) 1 MG tablet take 1 tablet by mouth once daily START 5-7 DAYS BEFORE ALIMTA CH...  (REFER TO PRESCRIPTION NOTES). 02/13/15   Historical Provider, MD  furosemide (LASIX) 20 MG tablet Take 1 tablet (20 mg total) by mouth daily. 04/12/15   Nicholas Lose, MD  ibuprofen (ADVIL,MOTRIN) 200 MG tablet Take 400 mg by mouth every 6 (six) hours as needed for moderate pain. Reported on 04/06/2015    Historical Provider, MD  ipratropium-albuterol (DUONEB) 0.5-2.5 (3) MG/3ML SOLN Take 3 mLs by nebulization  every 6 (six) hours as needed. 05/16/15   Nicholas Lose, MD  levETIRAcetam (KEPPRA) 500 MG tablet Take 1 tablet (500 mg total) by mouth 2 (two) times daily. 04/01/15   Ripudeep Krystal Eaton, MD  metoprolol tartrate (LOPRESSOR) 25 MG tablet Take 1 tablet (25 mg total) by mouth 2 (two) times daily. 04/01/15   Ripudeep Krystal Eaton, MD  nystatin (MYCOSTATIN) 100000 UNIT/ML suspension 5 ml swish and spit BID 05/11/15   Nicholas Lose, MD  pantoprazole (PROTONIX) 40 MG tablet  04/20/15   Historical Provider, MD  polyethylene glycol (MIRALAX / GLYCOLAX) packet take 17 grams once daily if needed for MODERATE CONSTIPATION 04/01/15   Historical Provider, MD  PROAIR HFA 108 [phone] Base) MCG/ACT inhaler  04/19/15   Historical Provider, MD  sucralfate (CARAFATE) 1 GM/10ML suspension Take 10 mLs (1 g total) by mouth 4 (four) times daily -  with meals and at bedtime. 04/30/15   Nicholas Lose, MD   BP 136/78 mmHg  Pulse 86  Temp(Src) 98.2 F (36.8 C) (Oral)  Resp 18  Wt 218 lb 1.6 oz (98.93 kg)  SpO2 96%  LMP 04/15/2015 Physical Exam  Constitutional: She appears well-developed and well-nourished.  HENT:  Head: Normocephalic and atraumatic.  Eyes: Conjunctivae are normal. Pupils are equal, round, and reactive to light.  Neck: Neck supple. No tracheal deviation present. No thyromegaly present.  Cardiovascular: Normal rate and regular rhythm.   No murmur heard. Pulmonary/Chest: Effort normal and breath sounds normal.  Abdominal: Soft. Bowel sounds are normal. She exhibits no distension. There is no tenderness.  Obese  Musculoskeletal: Normal range of motion. She exhibits edema. She exhibits no tenderness.  2+ pretibial pitting edema bilaterally  Neurological: She is alert. Coordination normal.  Skin: Skin is warm and dry. No rash noted.  Psychiatric: She has a normal mood and affect.  Nursing note and vitals reviewed.   ED Course  Procedures (including critical care time) Labs Review Labs Reviewed  BASIC METABOLIC PANEL  [place], ED    Imaging Review No results found. I have personally reviewed and evaluated these images and lab results as part of my medical decision-making.   EKG Interpretation   Date/Time:  Sunday May 20 2015 09:05:58 EDT Ventricular Rate:  93 PR Interval:  144 QRS Duration: 86 QT Interval:  344 QTC Calculation: 427 R Axis:   20 Text Interpretation:  Normal sinus rhythm Normal ECG No significant change  since last tracing Confirmed by   MD,  ([id number]) on 05/20/2015  9:28:43 AM     12  noon feels improved after treatment with GI cocktail Chest x-ray viewed by me and discussed with Dr.Shadad  Results for orders placed or performed during the hospital encounter of 86/16/83  Basic metabolic panel  Result Value Ref Range   Sodium 139 135 - 145 mmol/L   Potassium 3.7 3.5 - 5.1  mmol/L   Chloride 101 101 - 111 mmol/L   CO2 26 22 - 32 mmol/L   Glucose, Bld 136 (H) 65 - 99 mg/dL   BUN 9 6 - 20 mg/dL   Creatinine, Ser 0.57 0.44 - 1.00 mg/dL   Calcium 9.5 8.9 - 10.3 mg/dL   GFR calc non Af Amer >60 >60 mL/min   GFR calc Af Amer >60 >60 mL/min   Anion gap 12 5 - 15  CBC  Result Value Ref Range   WBC 12.5 (H) 4.0 - 10.5 K/uL   RBC 3.87 3.87 - 5.11 MIL/uL   Hemoglobin 11.1 (L) 12.0 - 15.0 g/dL   HCT 33.4 (L) 36.0 - 46.0 %   MCV 86.3 78.0 - 100.0 fL   MCH 28.7 26.0 - 34.0 pg   MCHC 33.2 30.0 - 36.0 g/dL   RDW 16.6 (H) 11.5 - 15.5 %   Platelets 214 150 - 400 K/uL  Hepatic function panel  Result Value Ref Range   Total Protein 6.2 (L) 6.5 - 8.1 g/dL   Albumin 2.7 (L) 3.5 - 5.0 g/dL   AST 25 15 - 41 U/L   ALT 72 (H) 14 - 54 U/L   Alkaline Phosphatase 73 38 - 126 U/L   Total Bilirubin 0.2 (L) 0.3 - 1.2 mg/dL   Bilirubin, Direct 0.1 0.1 - 0.5 mg/dL   Indirect Bilirubin 0.1 (L) 0.3 - 0.9 mg/dL  I-stat troponin, ED (not at [facility])  Result Value Ref Range   Troponin i, poc 0.00 0.00 - 0.08 ng/mL   Comment 3           Dg Chest 2 View  05/20/2015   CLINICAL DATA:  Chest pain, cough EXAM: CHEST  2 VIEW COMPARISON:  04/26/2015 FINDINGS: Radiation changes/ scarring in the right upper lobe. Mild subpleural nodularity along the lateral right hemithorax, possibly mildly progressed. Left lung is clear.  No pleural effusion or pneumothorax. Cardiomegaly. Old/healing right rib fracture deformities. IMPRESSION: Mild subpleural nodularity along the lateral right hemithorax, possibly mildly progressed. Consider CT chest for further evaluation given the history of malignancy. Radiation changes/scarring in the right upper lobe. Old/healing right rib fracture deformities. Electronically Signed   By: Julian Hy M.D.   On: 05/20/2015 10:09   Ct Chest W Contrast  04/26/2015  CLINICAL DATA:  Right upper lobe lung adenocarcinoma diagnosed in April 2016 presenting for restaging status post radiation therapy and chemotherapy. EXAM: CT CHEST, ABDOMEN, AND PELVIS WITH CONTRAST TECHNIQUE: Multidetector CT imaging of the chest, abdomen and pelvis was performed following the standard protocol during bolus administration of intravenous contrast. CONTRAST:  160m OMNIPAQUE IOHEXOL 300 MG/ML  SOLN COMPARISON:  03/24/2015 chest CT. 01/01/2015 CT chest, abdomen and pelvis. FINDINGS: CT CHEST Mediastinum/Nodes: Normal heart size. Stable trace pericardial fluid/thickening. Coronary atherosclerosis. Great vessels are normal in course and caliber. No central pulmonary emboli. Normal visualized thyroid. Stable circumferential wall thickening in the mid thoracic esophagus with associated fluid level. No axillary adenopathy. There is a 1.2 cm short axis hypodense structure in the anterior mediastinum (series 2/ image 15), previously 1.3 cm, not appreciably changed (this structure was non FDG avid on the 06/23/2014 PET-CT), indeterminate, probably benign such as a thymic cyst. Lungs/Pleura: No pneumothorax. Trace residual right pleural effusion is decreased and largely resolved. No left  pleural effusion. There is sharply marginated patchy consolidation and ground-glass opacity in the right upper lobe, which has decreased in the interval, with associated mild volume loss, and is in  keeping with evolving postradiation change. There is no residual measurable pulmonary nodule in the right upper lobe. There is a new slightly irregular 5 mm left upper lobe pulmonary nodule (series 4/image 17). A separate 3 mm left upper lobe pulmonary nodule (series 4/ image 24) is stable since 06/12/2014 and is probably benign. No acute consolidative airspace disease or additional significant pulmonary nodules . Musculoskeletal: No aggressive appearing focal osseous lesions. There is an acute/ subacute minimally displaced lateral right fifth rib fracture. CT ABDOMEN AND PELVIS Hepatobiliary: Diffuse hepatic steatosis. No liver mass. Normal gallbladder with no radiopaque cholelithiasis. No biliary ductal dilatation. Pancreas: Normal, with no mass or duct dilation. Spleen: Normal size. No mass. Adrenals/Urinary Tract: Normal adrenals. Hypodense 0.8 cm lesion in the medial upper left kidney, too small to characterize, not seen on prior studies. Otherwise normal kidneys, with no hydronephrosis. Normal bladder. Stomach/Bowel: Grossly normal stomach. Normal caliber small bowel with no small bowel wall thickening. Normal appendix. Normal large bowel with no diverticulosis, large bowel wall thickening or pericolonic fat stranding. Vascular/Lymphatic: Normal caliber abdominal aorta. Patent portal, splenic, hepatic and renal veins. No pathologically enlarged lymph nodes in the abdomen or pelvis. Reproductive: Stable mildly enlarged myomatous uterus containing fibroids measuring up to 2.6 cm in size. The ovaries are relatively symmetric in size and demonstrate stable nonspecific scattered faint ovarian calcifications bilaterally without discrete ovarian or adnexal mass. Other: No pneumoperitoneum, ascites or focal fluid  collection. Musculoskeletal: No aggressive appearing focal osseous lesions. IMPRESSION: 1. Evolving radiation change in the right upper lobe, with no residual measurable pulmonary nodules in the right upper lobe. 2. New 5 mm left upper lobe pulmonary nodule, indeterminate, metastasis not excluded. Recommend attention on follow-up chest CT in 3 months. 3. Nearly completely resolved right pleural effusion. 4. No definite evidence of metastatic disease in the abdomen or pelvis. A new subcentimeter hypodense lesion in the medial upper left kidney is too small to characterize and warrants attention on follow-up CT abdomen/pelvis in 3 months. 5. New minimally displaced acute/subacute lateral right fifth rib fracture. 6. Additional findings include coronary atherosclerosis, nonspecific stable circumferential wall thickening in the mid thoracic esophagus, hepatic steatosis and myomatous uterus. Electronically Signed   By: Ilona Sorrel M.D.   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 Lung cancer with brain metastases. Left temporal lobe lesion at least that has been treated with SRS EXAM: MRI HEAD WITHOUT AND WITH CONTRAST TECHNIQUE: Multiplanar, multiecho pulse sequences of the brain and surrounding structures were obtained without and with intravenous contrast. CONTRAST:  23m MULTIHANCE GADOBENATE DIMEGLUMINE 529 MG/ML IV SOLN COMPARISON:  01/12/2015 study (03/26/2015 study is noncontrast) FINDINGS: Calvarium and upper cervical spine: No focal marrow signal abnormality. Orbits: No evidence of metastasis. Sinuses and Mastoids: Clear. Brain: 3 enhancing lesions/ metastases: Low left cerebellum, currently 5 mm previously 9 mm. Low right parietal lobe 10:87, stable at 3 mm Inferior left temporal lobe, stable at 10 mm. There is new internal T1 hyperintensity within this lesion and growth noted on prior study may have been secondary  to internal hemorrhage. Focal enhancing dural nodule along the left frontal lobe 10:96, 4 mm. This is been stable since April 2016 at least and may reflect an incidental tiny meningioma. No incidental infarct, hemorrhage, hydrocephalus, or major vessel occlusion. IMPRESSION: 1. Since 01/2015, size stable metastases in the left temporal and right parietal lobes. Decreasing left cerebellar metastasis. 2.  4 mm dural nodule along the left frontal convexity is stable in retrospect since April 2016. This could reflect an incidental tiny meningioma. Electronically Signed   By: Monte Fantasia M.D.   On: 04/27/2015 11:56   Ct Abdomen Pelvis W Contrast  04/26/2015  CLINICAL DATA:  Right upper lobe lung adenocarcinoma diagnosed in April 2016 presenting for restaging status post radiation therapy and chemotherapy. EXAM: CT CHEST, ABDOMEN, AND PELVIS WITH CONTRAST TECHNIQUE: Multidetector CT imaging of the chest, abdomen and pelvis was performed following the standard protocol during bolus administration of intravenous contrast. CONTRAST:  17m OMNIPAQUE IOHEXOL 300 MG/ML  SOLN COMPARISON:  03/24/2015 chest CT. 01/01/2015 CT chest, abdomen and pelvis. FINDINGS: CT CHEST Mediastinum/Nodes: Normal heart size. Stable trace pericardial fluid/thickening. Coronary atherosclerosis. Great vessels are normal in course and caliber. No central pulmonary emboli. Normal visualized thyroid. Stable circumferential wall thickening in the mid thoracic esophagus with associated fluid level. No axillary adenopathy. There is a 1.2 cm short axis hypodense structure in the anterior mediastinum (series 2/ image 15), previously 1.3 cm, not appreciably changed (this structure was non FDG avid on the 06/23/2014 PET-CT), indeterminate, probably benign such as a thymic cyst. Lungs/Pleura: No pneumothorax. Trace residual right pleural effusion is decreased and largely resolved. No left pleural effusion. There is sharply marginated patchy consolidation  and ground-glass opacity in the right upper lobe, which has decreased in the interval, with associated mild volume loss, and is in keeping with evolving postradiation change. There is no residual measurable pulmonary nodule in the right upper lobe. There is a new slightly irregular 5 mm left upper lobe pulmonary nodule (series 4/image 17). A separate 3 mm left upper lobe pulmonary nodule (series 4/ image 24) is stable since 06/12/2014 and is probably benign. No acute consolidative airspace disease or additional significant pulmonary nodules . Musculoskeletal: No aggressive appearing focal osseous lesions. There is an acute/ subacute minimally displaced lateral right fifth rib fracture. CT ABDOMEN AND PELVIS Hepatobiliary: Diffuse hepatic steatosis. No liver mass. Normal gallbladder with no radiopaque cholelithiasis. No biliary ductal dilatation. Pancreas: Normal, with no mass or duct dilation. Spleen: Normal size. No mass. Adrenals/Urinary Tract: Normal adrenals. Hypodense 0.8 cm lesion in the medial upper left kidney, too small to characterize, not seen on prior studies. Otherwise normal kidneys, with no hydronephrosis. Normal bladder. Stomach/Bowel: Grossly normal stomach. Normal caliber small bowel with no small bowel wall thickening. Normal appendix. Normal large bowel with no diverticulosis, large bowel wall thickening or pericolonic fat stranding. Vascular/Lymphatic: Normal caliber abdominal aorta. Patent portal, splenic, hepatic and renal veins. No pathologically enlarged lymph nodes in the abdomen or pelvis. Reproductive: Stable mildly enlarged myomatous uterus containing fibroids measuring up to 2.6 cm in size. The ovaries are relatively symmetric in size and demonstrate stable nonspecific scattered faint ovarian calcifications bilaterally without discrete ovarian or adnexal mass. Other: No pneumoperitoneum, ascites or focal fluid collection. Musculoskeletal: No aggressive appearing focal osseous lesions.  IMPRESSION: 1. Evolving radiation change in the right upper lobe, with no residual measurable pulmonary nodules in the right upper lobe. 2. New 5 mm left upper lobe pulmonary nodule, indeterminate, metastasis not excluded. Recommend attention on follow-up chest CT in 3 months. 3. Nearly completely resolved right pleural effusion. 4. No definite evidence of metastatic disease in the abdomen or pelvis. A new subcentimeter hypodense lesion in the medial upper left kidney is too small to characterize and warrants attention on follow-up CT abdomen/pelvis in 3 months. 5. New minimally displaced acute/subacute lateral right fifth rib  fracture. 6. Additional findings include coronary atherosclerosis, nonspecific stable circumferential wall thickening in the mid thoracic esophagus, hepatic steatosis and myomatous uterus. Electronically Signed   By: Ilona Sorrel M.D.   On: 04/26/2015 17:12    MDM  Chest pain likely secondary to broken ribs and or cancer. Patient is been using ibuprofen regularly to treat pain. I feel that she would benefit from PPI and and acids in light of chronic ibuprofen therapy. Dr. Alen Blew will arrange for her to have CT scan her for chest before her next 3 month visit. Suggest Tylenol instead of for pain. Final diagnoses:  None   this pain likely secondary to broken ribs and patient likely has pain contributing from esophagitis, gastritis and/or peptic ulcer disease Diagnosis #1 chest pain  #2 anemia   Orlie Dakin, MD 05/20/15 [place], MD 05/20/15 1818

## 2015-05-20 NOTE — ED Notes (Signed)
Pt here for chest pain, cough. sts that her chest has been hurting since she was here prior. sts she has been taking pain pills with no relief.

## 2015-05-21 ENCOUNTER — Telehealth: Payer: Self-pay | Admitting: Radiation Oncology

## 2015-05-21 NOTE — Telephone Encounter (Signed)
Patient left message requesting return call. Phoned patient's home. Spoke with patient's [name]. Reports "puffiness in her face." Reminded [name] this is related to the effects of Decadron. Encouraged him to have her follow up with her PCP. Went onto explain that patients requiring  long term steroid use need to have blood sugar managed by PCP and are occasionally prescribed Lasix. [name] verbalized understanding and expressed appreciation for the call.

## 2015-05-22 ENCOUNTER — Other Ambulatory Visit: Payer: Self-pay | Admitting: *Deleted

## 2015-05-22 ENCOUNTER — Telehealth: Payer: Self-pay | Admitting: *Deleted

## 2015-05-22 DIAGNOSIS — C3411 Malignant neoplasm of upper lobe, right bronchus or lung: Secondary | ICD-10-CM

## 2015-05-22 MED ORDER — FUROSEMIDE 20 MG PO TABS: 20.0000 mg | ORAL_TABLET | Freq: Every day | ORAL | Status: AC

## 2015-05-22 NOTE — Telephone Encounter (Signed)
Call from [facility] to have Lasix filled. Refilled and called and let [name] know. He verbalized understanding.

## 2015-06-01 ENCOUNTER — Telehealth: Payer: Self-pay | Admitting: Gastroenterology

## 2015-06-01 ENCOUNTER — Telehealth: Payer: Self-pay

## 2015-06-01 NOTE — Telephone Encounter (Signed)
Returned call to [employer].  He reports she is having pain in her stomach and wants an endoscopy, she went to Dr. Ardis Hughs who gave her a pill and told her to call back if it doesn't help.  [name] reports he called and rcvd no return call.  I advised [name] that we cannot order an endoscopy.  He will need to call Dr. Ardis Hughs office again.  He voiced understanding.

## 2015-06-04 NOTE — Telephone Encounter (Signed)
Left message with family member to return call.

## 2015-06-07 NOTE — Telephone Encounter (Signed)
Left message on machine to call back, several messages have been left for the pt to return call.  I will wait for further communication from the pt

## 2015-06-14 ENCOUNTER — Telehealth: Payer: Self-pay | Admitting: Radiation Oncology

## 2015-06-14 NOTE — Telephone Encounter (Signed)
Attempting to contact patient to access status. No answer at any number tried. No options to leave a message.

## 2015-07-09 DEATH — deceased

## 2015-07-24 ENCOUNTER — Other Ambulatory Visit: Payer: Self-pay | Admitting: Radiation Therapy

## 2015-07-24 DIAGNOSIS — C7931 Secondary malignant neoplasm of brain: Secondary | ICD-10-CM

## 2015-07-24 DIAGNOSIS — C7949 Secondary malignant neoplasm of other parts of nervous system: Principal | ICD-10-CM

## 2015-07-30 ENCOUNTER — Other Ambulatory Visit: Payer: Medicaid Other

## 2015-07-30 ENCOUNTER — Ambulatory Visit: Payer: Medicaid Other | Admitting: Hematology and Oncology

## 2015-08-10 ENCOUNTER — Inpatient Hospital Stay: Admission: RE | Admit: 2015-08-10 | Payer: Medicaid Other | Source: Ambulatory Visit

## 2015-08-13 ENCOUNTER — Ambulatory Visit: Payer: Medicaid Other | Admitting: Radiation Oncology

## 2015-08-13 ENCOUNTER — Encounter: Payer: Self-pay | Admitting: Radiation Therapy

## 2015-08-13 NOTE — Progress Notes (Signed)
Per Destiny [name]'s PCP office, they received a call on 06/12/15 stating that Destiny [name] had been found dead. I can't find an official obituary to confirm death date, and family has not returned calls.   Mont Dutton R.T.(R)(T) Special Procedures Navigator [phone]

## 2016-02-23 ENCOUNTER — Other Ambulatory Visit: Payer: Self-pay | Admitting: Nurse Practitioner

## 2016-09-27 IMAGING — PT NM PET TUM IMG INITIAL (PI) WHOLE BODY
7 of 8 series · 23 of 25 positions shown · non-contrast
Comparison: Brain MRI 06/20/2014,. CT chest abdomen pelvis
06/12/2014.

CLINICAL DATA: Subsequent Treatment strategy for metastatic lung
cancer. Brain metastasis.

EXAM:
NUCLEAR MEDICINE PET WHOLE BODY
TECHNIQUE: 8.4 mCi F-18 FDG was injected intravenously. Full-ring PET imaging
was performed from the vertex to the feet after the radiotracer. CT
data was obtained and used for attenuation correction and anatomic
localization.
FASTING BLOOD GLUCOSE:  Value:  111 mg/dl

[Series 3: pet wb ac · axial · 5.0mm · 4.07mm/px · z∈[-118,+1726]mm · 5 of 462 slices shown]
[im 1/462]
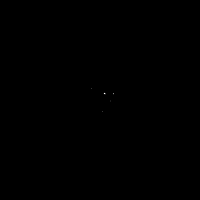
[im 116/462]
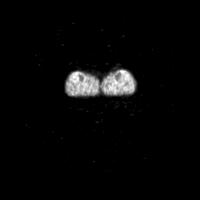
[im 231/462]
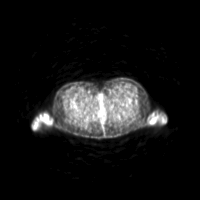
[im 346/462]
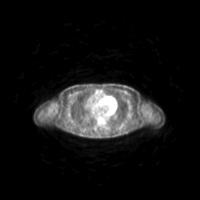
[im 462/462]
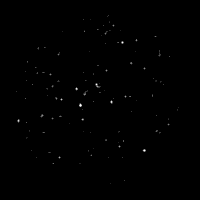

[Series 4: ct wb 5.0 hd_fov · axial · 5.0mm · 1.21mm/px · z∈[-118,+1726]mm · 4 of 462 slices shown]
[im 1/462]
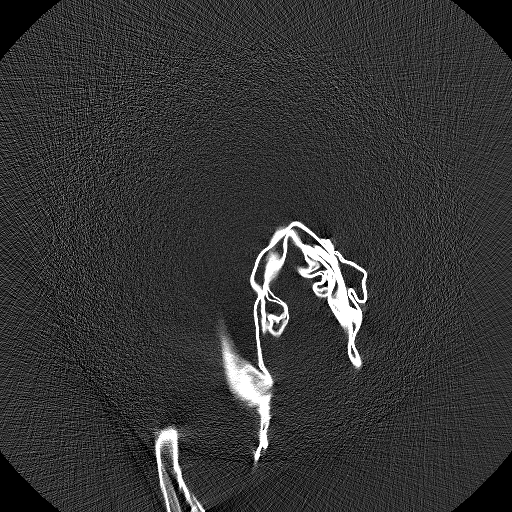
[im 231/462  soft-tissue]
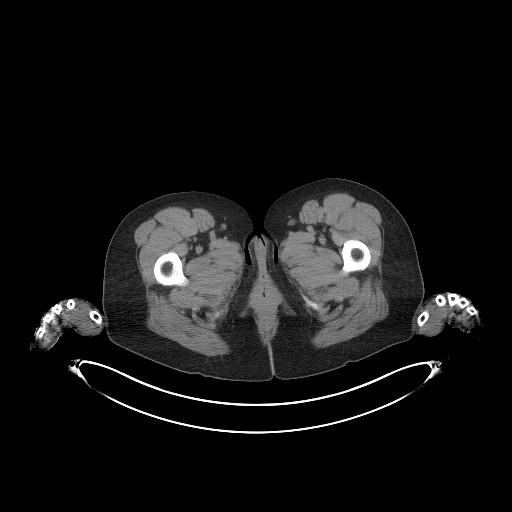
[im 346/462  soft-tissue]
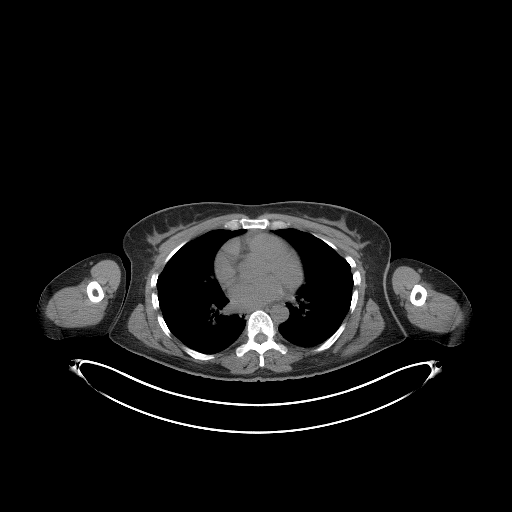
[im 462/462  soft-tissue]
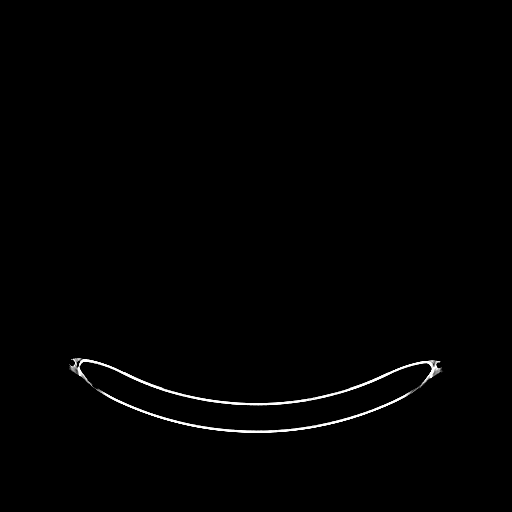

[Series 6: ct wb 5.0 b70f (id)_bone · axial · 5.0mm · 0.80mm/px · 1 of 78 slices shown]
[im 1/78  soft-tissue]
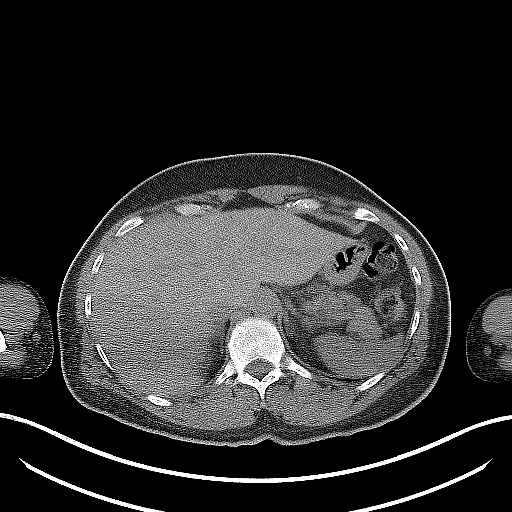

[Series 8: pet wb nac · axial · 5.0mm · 4.07mm/px · z∈[-110,+1726]mm · 6 of 460 slices shown]
[im 1/460]
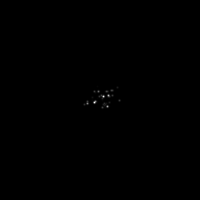
[im 92/460]
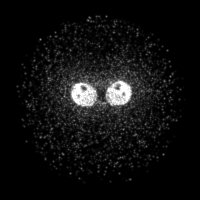
[im 184/460]
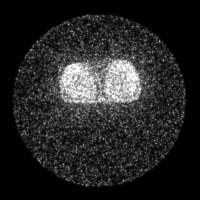
[im 276/460]
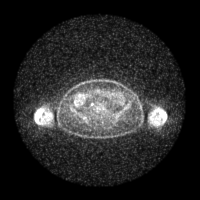
[im 368/460]
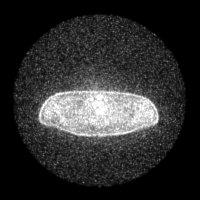
[im 460/460]
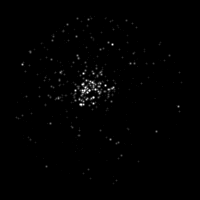

[Series 604: mip collection<mip range> · coronal · 3.82mm/px · 1 of 32 slices shown]
[im 1/32]
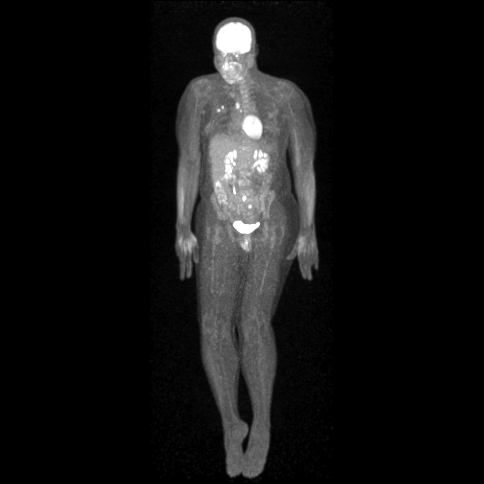

[Series 606: range-ct wb 5.0 hd_fov-tra-<alpha range> · 5 of 448 slices shown]
[im 1/448]
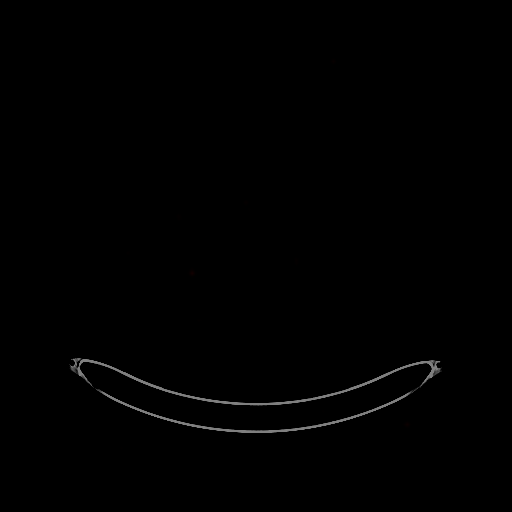
[im 112/448]
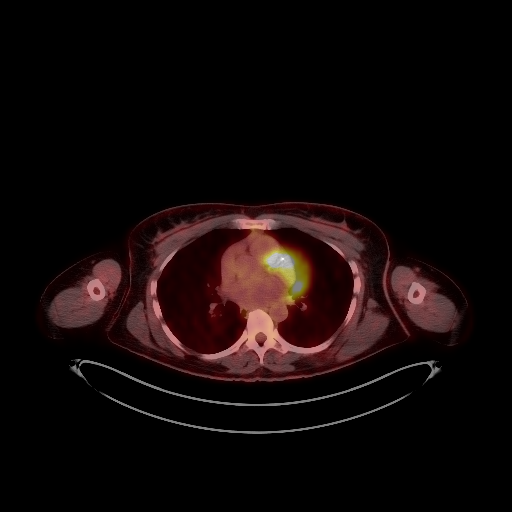
[im 224/448]
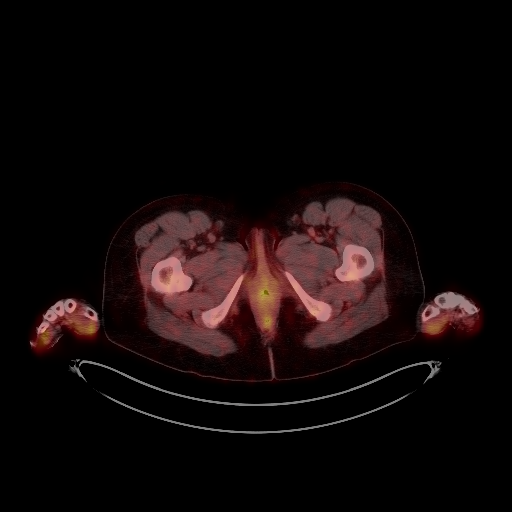
[im 336/448]
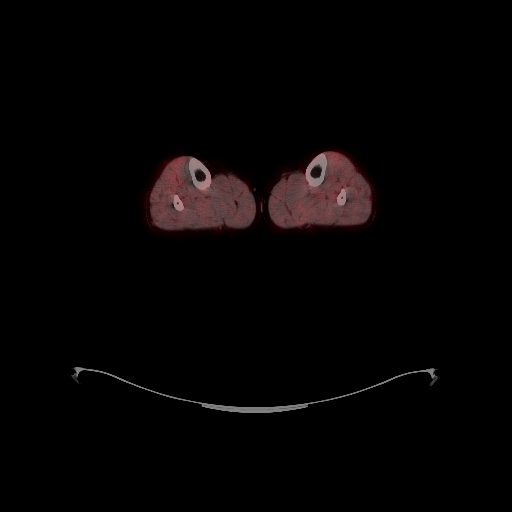
[im 448/448]
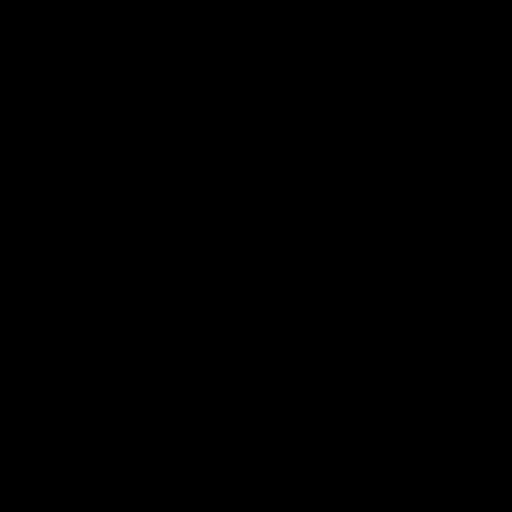

[Series 1032: results mm oncology reading · 0.89mm/px · 1 of 6 slices shown]
[im 1/6]
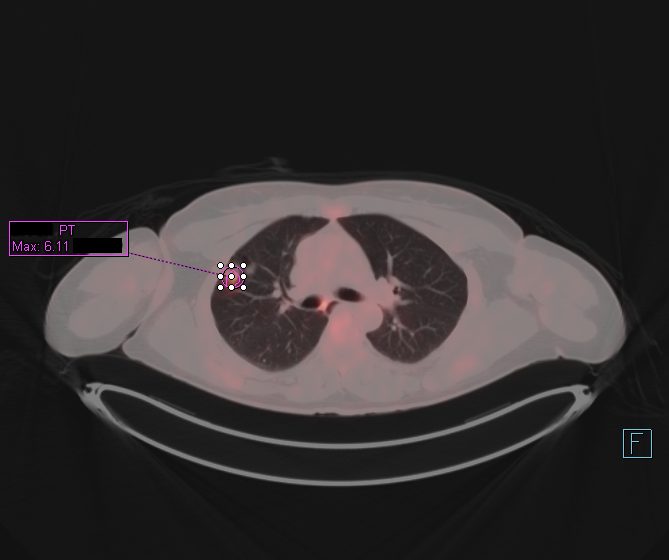

[23 of 25 positions shown; findings below may reference images not displayed]

FINDINGS: Head/Neck: No hypermetabolic lymph nodes in the neck. The brain
metastasis in identified by FDG PET brain imaging which is not
atypical.

Chest: Right upper lobe nodule measuring 18 mm (image 41 , series 6)
has intense metabolic activity with SUV max 6.1. Adjacent 13 mm
nodule image 37 is also hypermetabolic with SUV max equal 4.7.

Small hypermetabolic right lower paratracheal lymph node measuring
11 mm with SUV max equal 5.9. Small sub carinal hypermetabolic lymph
nodes on image 106 is also hypermetabolic. No supraclavicular nodes.

Abdomen/Pelvis: No abnormal metabolic activity within the liver.
Normal adrenal glands.

Focus of hypermetabolic activity associated with the sigmoid colon
on image 188 of fused data set. This activity intense with SUV max
13. Focus of apparent stool at this level on level on image 191 of
series 4.

There is hypermetabolic activity associated with the left adnexa.
The there is a mixed left ovary measuring 4 cm by 4 cm image 198
series 4. The metabolic activity is seen on the fused data set on
image 195. The metabolic activity associated lower density portion
of the ovary. Right ovary is also mixed density.

Skeleton: No focal hypermetabolic activity to suggest skeletal
metastasis.

Extremities: No hypermetabolic activity to suggest metastasis.
IMPRESSION: 1. Two hypermetabolic pulmonary nodules in the right upper lobe are
most consists with primary bronchogenic carcinoma.
2. Mediastinal nodal metastasis to right lower paratracheal and
subcarinal nodal stations.
3. Focus of metabolic activity within the proximal sigmoid colon.
Differential includes physiologic activity within the bowel, a pre
cancerous polyp, or adenocarcinoma. Favor physiologic activity. No
discrete lesion seen on comparison CT.
4. Metabolic activity associated with mixed density left ovaries is
felt to be physiologic.
